# Patient Record
Sex: Female | Born: 1950 | Race: White | Hispanic: No | Marital: Single | Smoking: Never smoker
Health system: Southern US, Community
[De-identification: ages and names within clinical notes are randomized; demographics above are authoritative.]

## PROBLEM LIST (undated history)

## (undated) DIAGNOSIS — B019 Varicella without complication: Secondary | ICD-10-CM

## (undated) DIAGNOSIS — I219 Acute myocardial infarction, unspecified: Secondary | ICD-10-CM

## (undated) DIAGNOSIS — I10 Essential (primary) hypertension: Secondary | ICD-10-CM

## (undated) DIAGNOSIS — M1990 Unspecified osteoarthritis, unspecified site: Secondary | ICD-10-CM

## (undated) DIAGNOSIS — D509 Iron deficiency anemia, unspecified: Principal | ICD-10-CM

## (undated) DIAGNOSIS — I2510 Atherosclerotic heart disease of native coronary artery without angina pectoris: Secondary | ICD-10-CM

## (undated) DIAGNOSIS — I509 Heart failure, unspecified: Secondary | ICD-10-CM

## (undated) DIAGNOSIS — G4730 Sleep apnea, unspecified: Secondary | ICD-10-CM

## (undated) DIAGNOSIS — N189 Chronic kidney disease, unspecified: Secondary | ICD-10-CM

## (undated) DIAGNOSIS — I739 Peripheral vascular disease, unspecified: Secondary | ICD-10-CM

## (undated) DIAGNOSIS — R51 Headache: Secondary | ICD-10-CM

## (undated) DIAGNOSIS — K253 Acute gastric ulcer without hemorrhage or perforation: Secondary | ICD-10-CM

## (undated) DIAGNOSIS — E1151 Type 2 diabetes mellitus with diabetic peripheral angiopathy without gangrene: Secondary | ICD-10-CM

## (undated) DIAGNOSIS — E785 Hyperlipidemia, unspecified: Secondary | ICD-10-CM

## (undated) DIAGNOSIS — E063 Autoimmune thyroiditis: Secondary | ICD-10-CM

## (undated) DIAGNOSIS — R519 Headache, unspecified: Secondary | ICD-10-CM

## (undated) DIAGNOSIS — K912 Postsurgical malabsorption, not elsewhere classified: Secondary | ICD-10-CM

## (undated) DIAGNOSIS — D649 Anemia, unspecified: Secondary | ICD-10-CM

## (undated) DIAGNOSIS — I639 Cerebral infarction, unspecified: Secondary | ICD-10-CM

## (undated) DIAGNOSIS — E039 Hypothyroidism, unspecified: Secondary | ICD-10-CM

## (undated) DIAGNOSIS — K529 Noninfective gastroenteritis and colitis, unspecified: Secondary | ICD-10-CM

## (undated) HISTORY — DX: Cerebral infarction, unspecified: I63.9

## (undated) HISTORY — DX: Heart failure, unspecified: I50.9

## (undated) HISTORY — DX: Autoimmune thyroiditis: E06.3

## (undated) HISTORY — DX: Hyperlipidemia, unspecified: E78.5

## (undated) HISTORY — DX: Chronic kidney disease, unspecified: N18.9

## (undated) HISTORY — DX: Anemia, unspecified: D64.9

## (undated) HISTORY — DX: Iron deficiency anemia, unspecified: D50.9

## (undated) HISTORY — DX: Sleep apnea, unspecified: G47.30

## (undated) HISTORY — PX: ABDOMINAL HYSTERECTOMY: SHX81

## (undated) HISTORY — DX: Essential (primary) hypertension: I10

## (undated) HISTORY — DX: Noninfective gastroenteritis and colitis, unspecified: K52.9

## (undated) HISTORY — DX: Postsurgical malabsorption, not elsewhere classified: K91.2

## (undated) HISTORY — PX: CHOLECYSTECTOMY: SHX55

## (undated) HISTORY — PX: TONSILLECTOMY: SUR1361

## (undated) HISTORY — PX: CORONARY ARTERY BYPASS GRAFT: SHX141

## (undated) HISTORY — PX: JOINT REPLACEMENT: SHX530

## (undated) HISTORY — PX: ROUX-EN-Y GASTRIC BYPASS: SHX1104

## (undated) HISTORY — PX: TOTAL KNEE ARTHROPLASTY: SHX125

## (undated) HISTORY — DX: Atherosclerotic heart disease of native coronary artery without angina pectoris: I25.10

## (undated) HISTORY — DX: Unspecified osteoarthritis, unspecified site: M19.90

---

## 1999-10-08 ENCOUNTER — Encounter: Admission: RE | Admit: 1999-10-08 | Discharge: 2000-01-06 | Payer: Self-pay | Admitting: Surgery

## 2001-11-28 ENCOUNTER — Encounter: Payer: Self-pay | Admitting: Family Medicine

## 2001-11-28 ENCOUNTER — Encounter: Admission: RE | Admit: 2001-11-28 | Discharge: 2001-11-28 | Payer: Self-pay | Admitting: Family Medicine

## 2003-02-13 ENCOUNTER — Encounter: Payer: Self-pay | Admitting: Emergency Medicine

## 2003-02-13 ENCOUNTER — Inpatient Hospital Stay (HOSPITAL_COMMUNITY): Admission: AD | Admit: 2003-02-13 | Discharge: 2003-02-16 | Payer: Self-pay | Admitting: Emergency Medicine

## 2003-02-13 ENCOUNTER — Encounter: Payer: Self-pay | Admitting: Neurology

## 2003-02-14 ENCOUNTER — Encounter (INDEPENDENT_AMBULATORY_CARE_PROVIDER_SITE_OTHER): Payer: Self-pay | Admitting: Cardiology

## 2003-12-07 ENCOUNTER — Emergency Department (HOSPITAL_COMMUNITY): Admission: EM | Admit: 2003-12-07 | Discharge: 2003-12-07 | Payer: Self-pay | Admitting: Family Medicine

## 2004-02-07 ENCOUNTER — Emergency Department (HOSPITAL_COMMUNITY): Admission: EM | Admit: 2004-02-07 | Discharge: 2004-02-07 | Payer: Self-pay

## 2004-03-22 ENCOUNTER — Inpatient Hospital Stay (HOSPITAL_COMMUNITY): Admission: EM | Admit: 2004-03-22 | Discharge: 2004-03-31 | Payer: Self-pay | Admitting: Emergency Medicine

## 2004-03-22 ENCOUNTER — Ambulatory Visit: Payer: Self-pay | Admitting: Infectious Diseases

## 2004-04-21 ENCOUNTER — Encounter
Admission: RE | Admit: 2004-04-21 | Discharge: 2004-04-21 | Payer: Self-pay | Admitting: Thoracic Surgery (Cardiothoracic Vascular Surgery)

## 2004-05-07 ENCOUNTER — Emergency Department (HOSPITAL_COMMUNITY): Admission: EM | Admit: 2004-05-07 | Discharge: 2004-05-07 | Payer: Self-pay | Admitting: *Deleted

## 2005-10-29 ENCOUNTER — Encounter: Admission: RE | Admit: 2005-10-29 | Discharge: 2005-10-29 | Payer: Self-pay | Admitting: Family Medicine

## 2006-01-25 ENCOUNTER — Ambulatory Visit (HOSPITAL_COMMUNITY): Admission: RE | Admit: 2006-01-25 | Discharge: 2006-01-25 | Payer: Self-pay | Admitting: Orthopedic Surgery

## 2006-01-25 ENCOUNTER — Encounter: Admission: RE | Admit: 2006-01-25 | Discharge: 2006-02-12 | Payer: Self-pay | Admitting: General Surgery

## 2006-02-05 ENCOUNTER — Ambulatory Visit (HOSPITAL_COMMUNITY): Admission: RE | Admit: 2006-02-05 | Discharge: 2006-02-05 | Payer: Self-pay | Admitting: General Surgery

## 2006-02-15 ENCOUNTER — Ambulatory Visit (HOSPITAL_COMMUNITY): Admission: RE | Admit: 2006-02-15 | Discharge: 2006-02-16 | Payer: Self-pay | Admitting: Orthopedic Surgery

## 2006-02-25 ENCOUNTER — Ambulatory Visit: Payer: Self-pay | Admitting: Pulmonary Disease

## 2006-03-10 ENCOUNTER — Ambulatory Visit (HOSPITAL_COMMUNITY): Admission: RE | Admit: 2006-03-10 | Discharge: 2006-03-10 | Payer: Self-pay | Admitting: General Surgery

## 2006-04-14 ENCOUNTER — Ambulatory Visit: Payer: Self-pay | Admitting: Pulmonary Disease

## 2006-05-18 ENCOUNTER — Ambulatory Visit: Payer: Self-pay | Admitting: Pulmonary Disease

## 2006-07-02 ENCOUNTER — Emergency Department (HOSPITAL_COMMUNITY): Admission: EM | Admit: 2006-07-02 | Discharge: 2006-07-02 | Payer: Self-pay | Admitting: Family Medicine

## 2006-07-13 ENCOUNTER — Emergency Department (HOSPITAL_COMMUNITY): Admission: EM | Admit: 2006-07-13 | Discharge: 2006-07-13 | Payer: Self-pay | Admitting: Family Medicine

## 2007-06-19 ENCOUNTER — Emergency Department (HOSPITAL_COMMUNITY): Admission: EM | Admit: 2007-06-19 | Discharge: 2007-06-19 | Payer: Self-pay | Admitting: Family Medicine

## 2007-07-18 ENCOUNTER — Encounter: Admission: RE | Admit: 2007-07-18 | Discharge: 2007-10-16 | Payer: Self-pay | Admitting: General Surgery

## 2007-08-01 ENCOUNTER — Encounter (INDEPENDENT_AMBULATORY_CARE_PROVIDER_SITE_OTHER): Payer: Self-pay | Admitting: General Surgery

## 2007-08-01 ENCOUNTER — Ambulatory Visit: Payer: Self-pay | Admitting: Surgery

## 2007-08-01 ENCOUNTER — Inpatient Hospital Stay (HOSPITAL_COMMUNITY): Admission: RE | Admit: 2007-08-01 | Discharge: 2007-08-04 | Payer: Self-pay | Admitting: General Surgery

## 2007-08-02 ENCOUNTER — Encounter (INDEPENDENT_AMBULATORY_CARE_PROVIDER_SITE_OTHER): Payer: Self-pay | Admitting: General Surgery

## 2007-11-30 ENCOUNTER — Encounter: Admission: RE | Admit: 2007-11-30 | Discharge: 2007-11-30 | Payer: Self-pay | Admitting: General Surgery

## 2008-06-12 ENCOUNTER — Ambulatory Visit: Payer: Self-pay

## 2008-09-29 ENCOUNTER — Inpatient Hospital Stay: Payer: Self-pay | Admitting: *Deleted

## 2008-09-29 ENCOUNTER — Ambulatory Visit: Payer: Self-pay | Admitting: Cardiology

## 2008-11-10 DIAGNOSIS — R55 Syncope and collapse: Secondary | ICD-10-CM | POA: Insufficient documentation

## 2008-11-10 DIAGNOSIS — I10 Essential (primary) hypertension: Secondary | ICD-10-CM | POA: Insufficient documentation

## 2008-11-10 DIAGNOSIS — I25810 Atherosclerosis of coronary artery bypass graft(s) without angina pectoris: Secondary | ICD-10-CM | POA: Insufficient documentation

## 2010-04-02 ENCOUNTER — Emergency Department: Payer: Self-pay | Admitting: Emergency Medicine

## 2010-10-21 NOTE — Op Note (Signed)
[name], PARO                ACCOUNT NO.:  [id number]   MEDICAL RECORD NO.:  [id number]          PATIENT TYPE:  INP   LOCATION:  [place]                         FACILITY:  [facility]   PHYSICIAN:  Marland Kitchen T. Hoxworth, M.D.DATE OF BIRTH:  December 11, 1950   DATE OF PROCEDURE:  08/01/2007  DATE OF DISCHARGE:                               OPERATIVE REPORT   PREOPERATIVE DIAGNOSES:  1. Morbid obesity.  2. Cholelithiasis.   POSTOPERATIVE DIAGNOSES:  1. Morbid obesity.  2. Cholelithiasis.   SURGICAL PROCEDURES:  1. Laparoscopic Roux-en-Y gastric bypass.  2. Laparoscopic cholecystectomy with intraoperative cholangiogram.   SURGEON:  Dr. Excell Seltzer.   ASSISTANT:  Dr. Johnathan Hausen.   ANESTHESIA:  General.   BRIEF HISTORY:  [name] is a 60 year old female with multiple  comorbidities and morbid obesity unresponsive to medical management.  After an extensive discussion and workup detailed elsewhere, we elected  to proceed with laparoscopic Roux-en-Y gastric bypass for treatment of  her morbid obesity.  She was then brought to the operating room for this  procedure.   DESCRIPTION OF OPERATION:  The patient was brought to the operating room  and placed in supine position on the operating table and general  endotracheal anesthesia was induced.  She received preoperative IV  antibiotics.  The abdomen was widely sterilely prepped and draped.  The  correct patient and procedure were verified. She received preoperative  subcutaneous Lovenox.  PAS were in place.  Abdominal access was attained  obtained in the left subcostal space with an 11 mm OptiVu trocar and  pneumoperitoneum established.  Under direct vision, an 11 mm trocar was  placed in the right subcostal space in the right upper abdomen in the  left periumbilical space for a camera port, an additional 5 mm trocar  along the left flank.  The omentum had some adhesions in the low midline  from hysterectomy that her taken down with the  harmonic scalpel. The  omentum was then elevated, the base of the transverse mesocolon  identified and the ligament of Treitz identified.  A 40 cm afferent limb  was then traced at which point the mesentery appeared mobile and the  bowel was divided at this point with a single firing of the EndoGIA  white load stapler.  The mesentery was further mobilized for short a  distance with the harmonic scalpel.  The end of the Roux limb was marked  with a Penrose drain. A 100 cm Roux limb was then measured.  At this  point, an anastomosis was created between the end of the biliopancreatic  limb and the side of the Roux limb at 100 cm. This was performed with  enterotomies using the harmonic scalpel and a single firing of the  EndoGIA white load stapler. The staple line was intact without bleeding  and the common enterotomy was closed with running 2-0 Vicryl beginning  at either end of the enterotomy and tied centrally.  The mesenteric  defect was then closed with a running 2-0 silk suture. The suture and  staple lines were coated with Tisseel tissue sealant.  Following this,  the patient was placed in reverse Trendelenburg and a Nathanson  retractor was placed through a subxiphoid 5-mm site and the left of the  lobe of the liver elevated with excellent exposure of the stomach and  the hiatus.  There was a small to moderate-sized hiatal hernia that was  easily reducible. The angle of Hiss was mobilized dividing peritoneum  and careful blunt dissection was carried back toward the retrogastric  space.  Along the lesser curve, a 4-5 cm gastric pouch was measured and  the peritoneum incised along the lesser curve and dissection carried at  right angles to the lesser curve back toward the retrogastric space and  the free lesser sac entered. After being sure all tubes were removed  from the stomach with initial firing of the echelon gold stapler about 3  cm at right angles to the lesser curve was fired.   Following this, two  further firings of the blue load echelon stapler were carried up through  the angle of Hiss completely dividing the small gastric pouch. This was  done with the Ewald tube in place protecting the EG junction which  appeared widely patent. Following this, the omentum was divided to  reduce tension on the Roux limb which then was able to be brought up to  the gastric pouch without undue tension. An initial posterior row of  running 2-0 Vicryl suture was placed for the gastrojejunostomy.  Enterotomies were then made in the pouch and the Roux limb with harmonic  scalpel in preparation for the anastomosis.  As the gastrojejunostomy  lay oriented with the lesser curve in a little more superior than usual,  it appeared we had a much better angle of the stapler through the  patient's left side and we made the enterotomy near the end of the candy  cane and the left side of the patient's gastric pouch. This allowed a  GIA linear stapler blue load to be easily inserted and an approximately  2 cm and anastomosis created. The staple lines were intact without  bleeding.  The enterotomy was then closed from either end with running 2-  0 Vicryl.  The Ewald tube was then passed down through the anastomosis  and outer seromuscular row of 2-0 Vicryl was placed. Following this with  the outlet of the gastric pouch clamped, Dr. Hassell Done performed endoscopy  and with the pouch tensely distended with air there was no evidence of  leak.  The pouch was desufflated and all saline suctioned. Tisseel  tissue sealant was used to coat the suture and staple lines of the  anastomosis.  It should be noted that prior to the endoscopy, the  Peterson's defect was closed as well suturing the mesocolon to the edge  of the Roux limb mesentery with a 2-0 silk.  Following completion of the  gastric bypass, attention was turned to the cholecystectomy.  Two  additional 5 mm trocars were placed in the right upper  quadrant and the  previously placed subxiphoid trocar was redirected through the same  incision into the right upper quadrant to the right of the falciform  ligament.  This allowed good visualization and exposure.  The fundus was  grasped and elevated up over the liver.  A few omental adhesions were  taken down and the infundibulum retracted inferolaterally.  The  peritoneum anterior and posterior to Calot's triangle was incised and  the fibrofatty tissue was stripped down off the neck of the gallbladder  toward the porta hepatis.  Calot's triangle was thoroughly dissected and  the cystic duct gallbladder junction dissected 360 degrees and the  cystic duct dissected out over about a centimeter.  When the anatomy  appeared clear, an operative cholangiogram was taken through the cystic  duct which showed good filling of a normal common bile duct and  intrahepatic ducts with free flow into the duodenum and no filling  defects.  Following this, the cholangiocath was removed, the cystic duct  was triply clipped proximally and divided.  Calot's triangle was further  dissected and the cystic artery was doubly clipped proximally and  clipped distally and divided. The gallbladder was then dissected free  from its bed using hook cautery, placed in an EndoCatch bag and removed  through the left sided 11 mm trocar site without much difficulty.  Complete hemostasis was obtained in the gallbladder bed with cautery and  a Surgicel pack.  Following this, all CO2 was evacuated and trocars  removed.  Skin incisions were closed with staples. Sponge, needle and  instrument counts were correct. The patient was taken to the recovery in  good condition.      [name]. Hoxworth, M.D.  Electronically Signed     BTH/MEDQ  D:  08/01/2007  T:  08/01/2007  Job:  [id number]

## 2010-10-21 NOTE — Discharge Summary (Signed)
[name], [name]                ACCOUNT NO.:  [id number]   MEDICAL RECORD NO.:  [id number]          PATIENT TYPE:  INP   LOCATION:  [place]                         FACILITY:  [facility]   PHYSICIAN:  Marland Kitchen T. Hoxworth, M.D.DATE OF BIRTH:  February 13, 1951   DATE OF ADMISSION:  08/01/2007  DATE OF DISCHARGE:  08/04/2007                               DISCHARGE SUMMARY   DISCHARGE DIAGNOSIS:  Morbid obesity.   OPERATIONS AND PROCEDURES:  Laparoscopic Roux-en-Y gastric bypass, Dr.  Excell Seltzer on August 01, 2007.   HISTORY OF PRESENT ILLNESS:  [name] is a 60 year old female with  progressive morbid obesity unresponsive to medical management and  multiple comorbidities as described below.  Following a thorough  preoperative evaluation and discussion detailed elsewhere, she is  electively admitted for laparoscopic Roux-en-Y gastric bypass.   PAST MEDICAL HISTORY:  1. Surgery.  2. Hysterectomy.  3. Coronary bypass graft in 2005.  4. Medical insulin-dependent diabetes mellitus.  5. Hypertension.  6. History of CVA.  7. Sleep apnea.  8. Elevated cholesterol.  9. Hypothyroidism.   MEDICATIONS:  1. Crestor 10 daily.  2. Levothyroxine  300 mcg daily.  3. Metformin 1000 daily.  4. Novolin 70/30.  5. Hydrochlorothiazide 12.5 daily.  6. Metoprolol 100 daily.  7. Aspirin 81 daily.  8. Flexeril.  9. Omega.  10.Multivitamins.   ALLERGIES:  None.   Social history, family history and review of systems, see detailed H&P.   PHYSICAL EXAMINATION:  VITAL SIGNS:  She is 5 feet 7, weight 298 pounds,  BMI 46.78.  GENERAL:  Morbidly obese.  No acute distress.  ABDOMEN: Obese, well-healed incision, well-healed sternotomy, otherwise  negative.   HOSPITAL COURSE:  The patient was admitted on the morning her procedure.  She underwent an uneventful laparoscopic Roux-en-Y gastric bypass.  Her  postoperative course was unremarkable.  On her first postoperative day,  vital signs were stable.  CBGs  were in the 120s.  Gastrografin swallow  showed no leak or obstruction.  Venous Dopplers were negative.  She was  started on a liquid diet.   She was observed in the step-down unit for 24 hours and transferred to  the floor.  On the second postoperative day, she was advanced to protein  shakes.  A portion of her NPH insulin was resumed.  Abdomen remained  benign.  White count was 8.3, hemoglobin 13.9.  By the third  postoperative day, she was tolerating protein shakes well.  Abdomen was  soft, nontender.  Wounds healing well.  She is afebrile.  Vital signs  all within normal limits.  She is discharged home at this time.  Her  insulin dose will be followed closely as an outpatient by her primary  care.   Return our office in 1 week.      [name]. Hoxworth, M.D.  Electronically Signed     BTH/MEDQ  D:  09/07/2007  T:  09/07/2007  Job:  [id number]

## 2010-10-21 NOTE — H&P (Signed)
NAMEGELSOMINA, BAKKEN                ACCOUNT NO.:  [id number]   MEDICAL RECORD NO.:  [id number]          PATIENT TYPE:  INP   LOCATION:  [place]                         FACILITY:  [facility]   PHYSICIAN:  Marland Kitchen T. Hoxworth, M.D.DATE OF BIRTH:  1950-08-29   DATE OF ADMISSION:  08/01/2007  DATE OF DISCHARGE:  08/04/2007                              HISTORY & PHYSICAL   CHIEF COMPLAINT:  Morbid obesity.   HISTORY OF PRESENT ILLNESS:  Ms. Friar is a 60 year old female with a  history of progressive morbid obesity unresponsive to medical  management.  She has multiple comorbidities including insulin-dependent  diabetes mellitus and coronary artery disease.  Also sleep apnea and  arthritis.  After extensive preoperative workup and discussion, she is  electively admitted for laparoscopic Roux-en-Y gastric bypass.   PAST SURGICAL HISTORY:  1. Hysterectomy.  2. Coronary artery bypass graft.   PAST MEDICAL HISTORY:  1. Medically she is followed for diabetes mellitus.  2. Hypertension.  3. History of CVA in 2004.  4. Sleep apnea.  5. Elevated cholesterol.  6. Hypothyroidism.   MEDICATIONS:  1. Crestor 10 daily.  2. Levothyroxine 300 mcg daily.  3. Metformin 1000 daily.  4. Novolin 70/30 insulin.  5. Hydrochlorothiazide 12.5 daily.  6. Metoprolol 100 daily.  7. Aspirin 81 daily.  8. Flexeril.  9. Omega 3.  10.Vitamins.   ALLERGIES:  None.   SOCIAL HISTORY:  Single.  Lives alone.  Works as a Company secretary.  No cigarette or alcohol use.   FAMILY HISTORY:  Both parents deceased with coronary artery disease.   REVIEW OF SYSTEMS:  GENERAL:  Weight as above.  CARDIOVASCULAR:  No  recent chest pain or palpitations.  RESPIRATORY:  No shortness breath,  cough or wheezing.  Positive for sleep apnea.  GI:  No abdominal pain,  reflux, normal bowel movements.   PHYSICAL EXAMINATION:  VITAL SIGNS:  Afebrile, heart rate 100, blood  pressure 174/90, respirations 20, she is 5 feet 7,  298 pounds, BMI  46.78.  GENERAL:  Morbidly obese.  No acute distress.  SKIN:  Warm, dry.  No rash or infection.  HEENT: No masses or thyromegaly.  LYMPH NODES:  None palpable.  LUNGS:  Clear without wheezing or increased work of breathing.  CARDIAC:  Well-healed sternotomy.  Regular rate and rhythm.  ABDOMEN:  Obese.  Healed midline incision.  No hernias.  Soft,  nontender.   ASSESSMENT/PLAN:  Morbid obesity with multiple comorbidities including  insulin dependent diabetes mellitus, dyslipidemia, coronary artery  disease, hypertension, arthritis and sleep apnea.  She is electively  admitted for laparoscopic Roux-en-Y gastric bypass.      [name]. Hoxworth, M.D.  Electronically Signed     BTH/MEDQ  D:  09/07/2007  T:  09/07/2007  Job:  [id number]

## 2010-10-22 ENCOUNTER — Emergency Department: Payer: Self-pay | Admitting: Emergency Medicine

## 2010-10-24 NOTE — Assessment & Plan Note (Signed)
[facility]                               PULMONARY OFFICE NOTE   NAKEDA, MIDDLEMISS                       MRN:          [id number]  DATE:02/25/2006                            DOB:          03-01-1951    HISTORY OF PRESENT ILLNESS:  The patient is a 60 year old female who comes  in today for management of obstructive sleep apnea.  The patient underwent  nocturnal polysomnography in January 2006 where she was found to have severe  obstructive apnea with a respiratory disturbance index of 75 events per hour  and O2 desaturation as low as 53%.  She ultimately underwent CPAP titration  where 13 cmH2O pressure seemed to resolve her events.  The patient is  scheduled to have upcoming bariatric surgery, however, has been unable to  wear CPAP on a regular basis.  She feels that she gets claustrophobic with  the nasal mask and the full face mask.  She will typically awaken three  hours after going to sleep with anxiety and feeling that she cannot breathe  and will pull the mask off.  It is unclear from our conversations whether it  is the mask or the feeling of the pressure.  The patient will typically work  nights where she goes to bed at 7 a.m. and gets up at 5:30 p.m.  When she is  off, she goes to bed at 11 and gets up at 8.  She is very tired in the  mornings whenever she awakens and has inappropriate daytime sleepiness with  periods of inactivity.  She also will have sleepiness with driving on  occasions.  Of note, the patient's weight is about 10 pounds increased over  the last two years.   PAST MEDICAL HISTORY:  Significant for:  1. Hypertension.  2. History of coronary artery disease with CABG in 2005.  3. History of diabetes.  4. History of CVA.  5. History of hypothyroidism for which she is on medications.   CURRENT MEDICATIONS:  Aspirin 81 mg daily, Thyroxine 300 mcg daily,  Diovan/HCTZ 320/25 daily, Metformin 1000 b.i.d., Lotrel 10/20 one  daily,  metoprolol 100 mg daily, Crestor 10 mg daily, Jenuvia 100 mg daily, Humulin  N 55 units daily, Apidra 10 units daily.   ALLERGIES:  No known drug allergies.   SOCIAL HISTORY:  She works as a Quarry manager.  She is married and has never smoked.   FAMILY HISTORY:  Remarkable for her sister having asthma, mother and father  having heart disease.   REVIEW OF SYSTEMS:  As per history of present illness.  Also, see patient  intake form documented on the chart.   PHYSICAL EXAMINATION:  GENERAL:  She is a morbidly obese female in no acute distress.  VITAL SIGNS:  Blood pressure 158/104, pulse 66, temperature 98.2, weight 292  pounds, O2 saturation on room air is 96%.  HEENT:  Pupils equal, round, reactive to light and accommodation,  extraocular muscles are intact, nares patent without discharge.  Oropharynx  does show elongation of the soft palate with a normal uvula.  NECK:  Supple without lymphadenopathy or thyromegaly.  It is very difficult  to assess for JVD because of her size.  CHEST:  Totally clear.  CARDIAC:  Regular rate and rhythm, no murmurs, gallops, and rubs.  ABDOMEN:  Soft, nontender, good bowel sounds.  GENITAL EXAM, BREAST EXAM, AND RECTAL EXAM:  These were not done, not  indicated.  EXTREMITIES:  Lower extremities shows 1+ edema, left greater than right.  Pulses are intact distally.  NEUROLOGICAL:  She is alert and oriented with no obvious observable motor  defects.   IMPRESSION:  Severe, obstructive sleep apnea documented by nocturnal  polysomnography.  The patient is trying to wear CPAP but is having  difficulty either because of the mass or because of the pressure.  At this  point in time, I would like her to go back to the nasal pillows and will try  changing CPAP to BiPAP to see if that will help with her tolerance.  I would  also like to decrease her pressure to a more tolerable range and not  increase it until she is able to be more compliant.  I would rather  treat  70% of her sleep apnea with lower pressures than 0 on higher pressures.   PLAN:  1. We will switch to BiPAP with inspiratory pressure at 12 and expiratory      pressure of 8 and nasal pillows.  2. The patient will follow up in four weeks or sooner if she has problems.                                   [name], MD,FCCP   [facility]/MedQ  DD:  02/25/2006  DT:  02/27/2006  Job #:  [id number]   cc:   [name]. Hoxworth, M.D.  Chipper Herb, M.D.

## 2010-10-24 NOTE — Consult Note (Signed)
[name], [name]                ACCOUNT NO.:  [id number]   MEDICAL RECORD NO.:  [id number]          PATIENT TYPE:  INP   LOCATION:  3306                         FACILITY:  [facility]   PHYSICIAN:  Valentina Gu. Roxy Manns, M.D. DATE OF BIRTH:  September 04, 1950   DATE OF CONSULTATION:  03/25/2004  DATE OF DISCHARGE:                                   CONSULTATION   REQUESTING PHYSICIAN:  Shelva Majestic, M.D.   PRIMARY CARE PHYSICIAN:  Santiago Glad L. Darron Doom, M.D.   REASON FOR CONSULTATION:  Severe 3-vessel coronary artery disease with new  onset angina.   HISTORY OF PRESENT ILLNESS:  [name] is a 60 year old morbidly obese white  female from [place] with no previous history of coronary artery disease  but risk factors notable for history of hypertension, cardiovascular  disease, type 2 diabetes mellitus, hyperlipidemia, and a strong family  history of coronary artery disease.   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  This pain was relieved  by rest within 5 minutes. On October 15, again while the patient was at work  performing some fairly strenuous activity, she developed prolonged episode  of similar substernal chest pain, prompting her to present to the emergency  room.  Her pain resolved within 5 minutes.   The patient was admitted to the hospital and ruled out for acute myocardial  infarction, although she did have borderline elevated troponins consistent  with unstable angina. Subsequently, she underwent elective cardiac  catheterization yesterday by Dr. Ellouise Newer. Findings at the time of  catheterization were notable for severe 3-vessel coronary artery disease  with preserved left ventricular function.  Of note, the patient was  hypertensive at the time of her original presentation as well, with  inadequate control of her  underlying blood pressure.  Cardiac surgical  consultation has been requested.   REVIEW OF SYSTEMS:  GENERAL:  The patient reports otherwise feeling well.  She has a good appetite.  Her weight has been fairly stable, although she  weighs between 290-310 pounds on the average.  CARDIAC:  The patient denied  any episodes of chest pain, other than that described previously.  The  patient reports some problems with exertional shortness of breath, resting  shortness of breath, PND, orthopnea or lower extremities edema.  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 She  denies any syncopal episodes.  RESPIRATORY:  Notable for absence of  productive cough, hemoptysis, wheezing.  The patient does develop decreased  respirations as well as some transient dip in oxygen saturation while she is  sleeping in the supine position, as noted while she has been in-hospital  here this admission. GASTROINTESTINAL:  Negative.  The patient has no  difficulty swallowing. She denies any bowel problems and specifically  reports no hematochezia, hematemesis, melena, constipation.  MUSCULOSKELETAL:  Notable for some mild, chronic problems with left hip  pain. She otherwise denies arthritis or arthralgias.  NEUROLOGIC:  Notable  for recent episode of Bell's palsy on the right side approximately 6 weeks  ago.  The symptoms have improved and the patient has completed a brief  steroid taper. She has also seen an acupuncture specialist.  Patient had  history of stroke 1 year ago with essentially no residual symptoms.  The  patient denies any recent history of transient blindness, numbness or  weakness either involving upper or lower extremities.  GENITOURINARY:  Negative.  HEENT:  Negative. INFECTIOUS:  Negative.  ENDOCRINE : Notable for  type 2 diabetes mellitus.  The patient checks her sugars regularly and  states they have been under good control until recently when she had the   steroid taper administered, and this made her blood sugars go quite high  temporarily.  PSYCHIATRIC:  Notable in that the patient has been under somewhat increased  stress recently for personal reasons. Denies problems with clinically  significant anxiety or depression.   PAST MEDICAL HISTORY:  1.  Hypertension.  2.  Stroke approximately 1 year ago.  3.  Type 2 diabetes mellitus.  4.  Morbid obesity.  5.  Hyperlipidemia.  6.  Hypothyroidism.  7.  Bell's palsy approximately 6 weeks ago.  8.  Possible obstructive sleep apnea.   PAST SURGICAL HISTORY:  1.  Hysterectomy for uterine fibroids 1995.  2.  Tonsillectomy, remote past.   FAMILY HISTORY:  Presence of strong family history of coronary artery  disease.  The patient's father and grandfather both suffered myocardial  infarctions at young ages and ultimately died of coronary disease.   SOCIAL HISTORY:  Patient is single and lives alone here in [place]. Her  closest family members are her step-mother and step-sister and they both  live in [place].  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  She is a nonsmoker and denies significant alcohol  consumption.   MEDICATIONS PRIOR TO ADMISSION:  1.  Nifedipine ER.  2.  Lisinopril.  3.  Hydrochlorothiazide.  4.  Levoxyl.  5.  Avandamet.  6.  Humalog 15 units subcutaneous twice daily at breakfast and bedtime.   DRUG ALLERGIES:  NONE KNOWN.   PHYSICAL EXAMINATION:  VITAL SIGNS:  Currently afebrile, in normal sinus  rhythm and normotensive.  Blood pressures over the last 24 hours have ranged  between 110-140 mmHg respectively.  She did have a low grade temperature  yesterday morning.  GENERAL:  The patient is a well-appearing morbidly obese female who appears  her stated age, in no acute distress.  HEENT:  Grossly unrevealing.  NECK:  Supple. No cervical or supraclavicular lymphadenopathy.  There is no jugulovenous distention, no carotid bruits are  noted.  LUNGS:  Auscultation of the chest revealed clear and symmetrical breath  sounds bilaterally. No wheezes or rhonchi are noted.  Patient has very large  breasts, but there is no sign of active skin infections in the skin folds  underneath her breasts.  CARDIOVASCULAR:  Regular rate and rhythm.  ABDOMEN:  Very obese, but soft and nontender. There are no palpable masses.  Bowel sounds present.  EXTREMITIES:  Warm and well-perfused.  There is no lower extremities edema.  Distal pulses are diminished but palpable in both lower legs in the dorsalis  pedis position. There is no sign of significant venous insufficiency.  RECTAL/GENITOURINARY:  Both deferred.  SKIN:  Clean and dry, healthy-appearing throughout.   DIAGNOSTIC TESTS:  Cardiac catheterization performed by Dr. Claiborne Billings reviewed.  This demonstrated severe 3-vessel coronary artery disease with preserved  left ventricular function. Specifically, there is 80-90% proximal stenosis  of the left anterior descending coronary artery. There is 90% proximal  stenosis of the first diagonal branch.  There is 70% stenosis of the distal  left anterior descending coronary artery beyond take-off of the first  diagonal branch. The distal left anterior descending coronary artery appears  to be small and perhaps intramyocardial and may be a relatively poor target  for grafting.  As such, the diagonal branch may actually be the most  dominant vessel on the anterior wall.  There is 50-60% proximal stenosis of  the left circumflex coronary artery.  There is 100% occlusion of the mid  right coronary artery with left to right collateral filling of the distal  right coronary artery system.  Left ventricular function appears reasonably  well preserved. There are signs and suggestions of left ventricular  hypertrophy.  There are no significant wall motion abnormalities.   IMPRESSION:  Severe 3-vessel coronary artery disease with new onset angina.  Based  upon coronary anatomy, there is no question that likely the best long-  term treatment strategy for [name] would include coronary artery bypass  grafting. However, the associated risks of surgery will be elevated due to  her extreme morbid obesity and other comorbid conditions as noted  previously.  She will be at particular risk for perioperative stroke,  respiratory failure, pneumonia, renal failure, difficulty with wound  healing, mediastinitis. She may have a somewhat prolonged convalescence.   Nonetheless, percutaneous coronary intervention would be considerably less  satisfactory with respect to long-term results from the standpoint of  treatment of her underlying coronary artery disease.   PLAN:  I have discussed the issues at length with [name] in her hospital  room this afternoon.  Alternative treatment strategies have been discussed.  She understands and accepts the very high risk nature of surgical intervention.  She also understands the relative palliative nature of  coronary revascularization with implications for the need for life-long  aggressive management of the patient's serious co-morbid conditions and risk  factors including hypertension, hyperlipidemia, diabetes and morbid obesity.  All of her questions have been addressed. She understands and accepts the  associated risks of surgery, including but not limited to risk of death,  myocardial infarction, congestive heart failure, respiratory failure,  pneumonia, bleeding requiring blood transfusion, arrhythmia,  infection, recurrent coronary artery disease, congestive heart failure,  recurrent blockages in bypass grafts or native coronary arteries, difficulty  with wound healing.  All of her questions have been addressed.   We tentatively plan to proceed with surgery tomorrow.       CHO/MEDQ  D:  03/25/2004  T:  03/25/2004  Job:  [id number]   cc:   Shelva Majestic, M.D.  [phone] [address], [address] [place], [place]  [address]  Fax: [place] Darron Doom, M.D.  [address] [place]  [place] [address]  Fax: [phone]

## 2010-10-24 NOTE — Op Note (Signed)
[name], [name]                ACCOUNT NO.:  [id number]   MEDICAL RECORD NO.:  [id number]          PATIENT TYPE:  INP   LOCATION:  2307                         FACILITY:  [facility]   PHYSICIAN:  Valentina Gu. Roxy Manns, M.D. DATE OF BIRTH:  03-02-1951   DATE OF PROCEDURE:  03/26/2004  DATE OF DISCHARGE:                                 OPERATIVE REPORT   PREOPERATIVE DIAGNOSIS:  Severe three vessel coronary artery disease with  new onset angina.   POSTOPERATIVE DIAGNOSIS:  Severe three vessel coronary artery disease with  new onset angina.   PROCEDURES:  1.  Median sternotomy for coronary artery bypass grafting times three (left      internal mammary artery to the first diagonal branch of the left      anterior descending coronary artery, saphenous vein graft to the      circumflex marginal branch, saphenous vein graft to the posterior      descending artery and endoscopic saphenous harvest from right thigh).   SURGEON:  Valentina Gu. Roxy Manns, M.D.   ASSISTANT:  Ivin Poot, M.D.   SECOND ASSISTANT:  Jobe Igo. Zigmund Gottron.   ANESTHESIA:  General.   BRIEF CLINICAL NOTE:  The patient is a 60 year old morbidly obese white  female from [place] with no previous history of coronary artery disease,  but risk factors notable for a history of hypertension, type 2 diabetes  mellitus, hyperlipidemia and a strong family history of coronary artery  disease.  The patient presents with new onset symptoms of angina and was  admitted to the hospital on October 15.  She ruled out for an acute  myocardial infarction.  She underwent cardiac catheterization by Dr. Ellouise Newer demonstrating severe three vessel coronary artery disease with  preserved left ventricular function.  A full consultation note has been  dictated previously.   OPERATIVE CONSENT:  The patient had been counseled at length regarding the  indications, risks and potential benefits of coronary artery bypass  grafting.  Alternative  treatment strategies have been discussed. She  understands and accepts all associated risks of surgery and desires to  proceed as described.   OPERATIVE FINDINGS:  1.  Morbid obesity.  2.  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  3.  Bifid left anterior descending coronary artery with the distal portion      of the left anterior descending coronary artery very small and      intramyocardial.  The diagonal branch off the left anterior descending      coronary artery is the lateral branch of this bifid system and appears      to represent the dominant artery supplying the anterior wall of the left      ventricle.  4.  The circumflex marginal branch and the posterior descending coronary      artery were reasonable targets for grafting.  The left internal  mammary      artery and the saphenous vein conduit were all good quality conduit.      The diagonal branch was a fair quality target for grafting.   DESCRIPTION OF PROCEDURE:  The patient was brought to the operating room on  the above mentioned date and central monitoring is established by the  anesthesia service under the care and direction of Dr. Rica Koyanagi.  Specifically, a Swan-Ganz catheter was placed through the internal jugular  approach. A radial arterial line is placed in the left wrist.  Intravenous  antibiotics are administered. Following induction with general endotracheal  anesthesia, a Foley catheter was placed. The patient's chest, abdomen, both  groins and both lower extremities are prepared and draped in a sterile  manner.  Of note, the patient's left upper extremity had to be positioned  with the left arm extending out a 90 degree angle throughout the procedure.  Because of the patient's morbid obesity, the arm could not be  tucked at the  patient's side without completely obliterating the pulse to the radial  artery catheter.   Endoscopic saphenous vein harvesting was performed initially through a small  incision made just below the right knee.  The greater saphenous vein from  the entire right thigh was removed.  The incision below the knee is extended  several cm to facilitate removal of additional segment of greater saphenous  vein from the right lower leg. After the saphenous vein is removed from the  right lower extremity, the surgical incisions are closed in multiple layers  with running absorbable suture.  The saphenous vein conduit which is  utilized for surgery is felt to be good quality conduit.   The median sternotomy incision is performed and the left internal mammary  artery is dissected from the chest wall and prepared for bypass grafting.  The left internal mammary artery is slightly small caliber, but otherwise  good quality and has excellent flow.  The patient is heparinized  systemically.  After initial administration of heparin, the patient's  activated clotting time increases some, but does not increase anywhere near  the value required to sustain cardiopulmonary bypass.  Additional doses of  heparin are administered and the patient's activated clotting time actually  decreases between samples.  The patient is then given one complete vial of  recombinant antithrombin 3 for presumed factor deficiency.  After waiting  ten minutes, the activated clotting time again decreased rather than  increasing.  Subsequently, a second vial of recombinant antithrombin 3 is  given and simultaneously two units of fresh frozen plasma are thawed to  ultimately be transfused.  The patient's activated clotting time does  increase to greater than 600 seconds after the second vial of antithrombin 3  is given.  Eventually, two units of fresh frozen plasma are also administered and the patient's activated  clotting time remains  satisfactorily elevated throughout the entire cardiopulmonary bypass run.   The ascending aorta is normal in appearance.  The ascending aorta and the  right atrium are cannulated for cardiopulmonary bypass.  Once satisfactory  heparinization and anticoagulation is achieved, cardiopulmonary bypass is  begun and the surface of the heart is inspected.  There is moderate left  ventricular hypertrophy and biventricular enlargement.  Distal sites are  selected for coronary artery bypass grafting.  Of note, the coronary anatomy  for the anterior wall of the left ventricle is as described previously.  Specifically, the left anterior descending coronary artery appeared to be  a  bifid system. It bifurcates into a diagonal branch and a smaller  continuation branch of the left anterior descending coronary artery which is  small and deep intramyocardial.  The diagonal branch is felt to be by far  the larger of the two branches and probably the most important branch  supplying the anterior wall of the left ventricle.  Portions of saphenous  vein and the left internal mammary artery are trimmed to appropriate length.  A temperature probe is placed in the left ventricular septum.  A  cardioplegia catheter is placed in the ascending aorta.   The patient is allowed to cool passively to 34 degrees systemic temperature.  The aortic cross clamp is applied and cardioplegia is delivered in an  antegrade fashion through the aortic root.  Iced saline slush is applied for  topical hypothermia.  The initial cardioplegic arrest and myocardial cooling  are felt to be excellent.  Repeat doses of cardioplegia are administered  intermittently throughout the cross clamp portion of the operation both  through the aortic root and down the subsequently placed vein grafts to  maintain septal temperature below 15 degrees Centigrade.  The following  distal coronary anastomoses are performed:  1.  The  posterior descending coronary artery is grafted with a saphenous      vein graft in an end to side fashion.  This coronary artery measures 1.7      cm in diameter at the site of the distal bypass and is of fair to good      quality target.  There is diffuse disease in the posterior descending      coronary artery further downstream, but a 1.0 probe will pass through      the diseased segment.  A 1.5 probe easily passes into the distal right      coronary artery.  2.  The circumflex marginal branch is grafted with a saphenous vein graft in      an end to side fashion.  This coronary artery measures 2.0 mm in      diameter and is of good quality at the site of distal bypass.  3.  The diagonal branch off the left anterior descending coronary artery is      grafted with the left internal mammary artery in an end to side fashion.      This coronary artery measures 1.3 mm in diameter at the site of distal     bypass and is of fair quality. A 1.5 probe will pass retrograde up the      diagonal branch.  A 1.0 probe will pass distally although there is      diffuse disease in the distal segment.   Both proximal saphenous vein anastomoses are performed directly to the  ascending aorta prior to removal of the aortic cross clamp. The left  ventricular septal temperature is noted to increase appropriately following  reperfusion of the left internal mammary artery.  The aortic cross clamp is  removed after a total cross clamp time of 54 minutes.   The heart is defibrillated and subsequently normal sinus rhythm resumes.  All proximal and distal coronary anastomoses are inspected for hemostasis  and appropriate graft orientation.  Epicardial pacing wires are fixed to the  right ventricular outflow tract into the right atrial appendage. The patient  is rewarmed to 37 degrees Centigrade temperature.  The patient is weaned  from cardiopulmonary bypass without difficulty.  The patient's rhythm at   separation from  bypass is sinus bradycardia.  AV sequential pacing is  employed.  No inotropic support is required.  Total cardiopulmonary bypass  time for the operation is 66 minutes.  The patient was transfused one unit  of packed red blood cells during cardiopulmonary bypass due to anemia.   The venous and arterial cannulae are removed uneventfully. Protamine is  administered to reverse the anticoagulation.  The mediastinum and the left  chest are irrigated with saline solution containing vancomycin.  Meticulous  surgical hemostasis is ascertained. The mediastinum and the left chest are  drained with three chest tubes placed through separate stab incisions  inferiorly.  The median sternotomy is closed with double strength sternal  wire.  The soft tissues anterior to the sternum are closed in multiple  layers.  The subcutaneous tissues and dermis are closed with simple  interrupted sutures.  The skin is closed with a running subcuticular skin  closure.   The patient tolerated the procedure well and is transported to the surgical  intensive care unit in stable condition.  There are no intraoperative  complications.  All sponge, instrument and needle counts are verified  correct at the completion of the operation.       CHO/MEDQ  D:  03/26/2004  T:  03/26/2004  Job:  [id number]   cc:   Shelva Majestic, M.D.  [phone] [address] [place], [place] [address]  Fax: [place] Darron Doom, M.D.  [address] [place]  [place] [address]  Fax: [phone]

## 2010-10-24 NOTE — H&P (Signed)
NAME:  [name], [name]                          ACCOUNT NO.:  [id number]   MEDICAL RECORD NO.:  [id number]                   PATIENT TYPE:  EMS   LOCATION:  MAJO                                 FACILITY:  [facility]   PHYSICIAN:  Michael L. Doy Mince, M.D.           DATE OF BIRTH:  02-15-1951   DATE OF ADMISSION:  02/13/2003  DATE OF DISCHARGE:                                HISTORY & PHYSICAL   CHIEF COMPLAINT:  Right upper extremity weakness and speech problems.   HISTORY OF PRESENT ILLNESS:  This is the initial stroke service admission  for this 60 year old woman with a past medical history which includes  hypertension, diabetes, and morbid obesity. She was at work here at [employer] at about 8 p.m. yesterday when she noticed clumsiness in the right  upper extremity. She noticed particularly that she was having difficulty  with dropping the patient's blood samples which she had taken. She also  noted that she was having a little bit of difficulty with speech. She could  think of what she wanted to say, but was having difficulty getting words  out. She was not having particularly slurred speech. When she left she went  to swipe her card through the time clock and noticed that she could not hold  onto it very well. Nevertheless she simply drove home and did not notify  anyone else of her symptoms until about 5:30 the next morning when she asked  a friend to take her to the doctor and the friend brought her to the [facility] Room for further evaluation. Her symptoms have improved, but  are still present. She denies any history of previous similar symptoms. She  has had no associated headache, dizziness, chest pain, shortness of breath,  palpitations, nausea, or vomiting.   PAST MEDICAL HISTORY:  Remarkable for diabetes, which she admits is under  poor control, also hypertension, and morbid obesity. She is unsure of her  cholesterol status. She had a hysterectomy in 1995. She  gets her medical  care through [facility].   FAMILY HISTORY:  Remarkable for multiple members who have had strokes in old  age, which often lead to their deaths.   SOCIAL HISTORY:  She denies any tobacco use. 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   ALLERGIES:  Denies.   MEDICATIONS:  1. Actos 30 mg daily.  2. Prinzide 20/25 mg daily.  3. Procardia XL 90 mg daily.  4. Levothyroxine 0.2 mg daily.  5. Glucotrol XL 10 mg b.i.d.  6. Baby aspirin.   REVIEW OF SYSTEMS:  She has had recent upper respiratory symptoms over the  last few days, for which she has been taking over-the-counter Tavist.  Otherwise review of systems is completely negative except for H&P and  admission ER records.   PHYSICAL EXAMINATION:  VITAL SIGNS:  Temperature 99.7, blood pressure  150/69, pulse 87, respirations 18, O2 saturation 96% on  2 L by nasal  cannula.  GENERAL:  She is alert, morbidly obese, in no evident distress.  HEENT:  Normocephalic and atraumatic. Oropharynx is benign.  NECK:  Supple without carotid bruit.  HEART:  Regular rate and rhythm without murmurs.  CHEST:  Clear to auscultation.  ABDOMEN:  Obese and soft with normal active bowel sounds.  EXTREMITIES:  2+ pulses.  NEUROLOGIC:  Mental status:  She is awake, alert, and fully oriented. Speech  is fluent without dysarthria. She has no difficulty on confrontational  naming. Cranial nerves funduscopic exam reveals proliferative changes.  Pupils equal and reactive. Extraocular movements full without nystagmus.  Visual fields full to confrontation. Face, tongue, and palette move normally  and symmetrically. Facial sensation is intact to pinprick. Shoulder shrug  strength is normal. Motor testing normal bulk and tone. There is mild  weakness of the right upper extremity particularly in the triceps and finger  abductors. Otherwise normal strength throughout. Sensation:  Pinprick is   symmetric throughout, but she does consistently extinguish on the right to  double cell tainting stimulation in the upper extremity and to a lesser  extent the lower. Reflexes are 2+ of extremities and knees, trace ankles.  Toes are downgoing. Coordination and rapid movements were best performed on  the right. Finger to nose was performed adequately. Gait she is able to rise  from the bed easily and ambulates without much difficulty. She is able to  stand with her feet together and can toe walk a bit.   LABORATORY DATA:  CBC is unremarkable. CMET is remarkable for a low  potassium of 3.2 with an elevated glucose of 301. Coags are normal. EKG  reveals first degree AV block, poor R-wave progression, but no acute  findings. CT of the head is personally reviewed and reveals a small subacute  infarct in the left frontal parietal white matter just below the cortex.   IMPRESSION:  1. Acute left brain stroke.  2. Hypertension.  3. Diabetes.   PLAN:  We will admit to the stroke unit for routine stroke care including  MRI, MRA, carotid, and transcranial Dopplers, transthoracic echo, and stroke  labs. We will ask PT and OT to assist in rehabilitation. She will likely  need more potent antiplatelet therapy. She will also need risk factor  intervention, consider teaching service evaluation for management of her  various medical problems.                                                Michael L. Doy Mince, M.D.    MLR/MEDQ  D:  02/13/2003  T:  02/13/2003  Job:  [id number]

## 2010-10-24 NOTE — Discharge Summary (Signed)
NAME:  [name], [name]                          ACCOUNT NO.:  [id number]   MEDICAL RECORD NO.:  [id number]                   PATIENT TYPE:  INP   LOCATION:  3004                                 FACILITY:  [facility]   PHYSICIAN:  Alyson Locket. Love, M.D.                 DATE OF BIRTH:  Jul 27, 1950   DATE OF ADMISSION:  02/13/2003  DATE OF DISCHARGE:  02/16/2003                                 DISCHARGE SUMMARY   DISCHARGE DIAGNOSES:  1. 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  2. Hypertension.  3. Poorly controlled diabetes.  4. Obesity.  5. Hysterectomy in 1995.  6. Hypothyroidism.   DISCHARGE MEDICATIONS:  1. Actos 30 mg q.d.  2. Levoxyl 200 mcg q.d.  3. Glucotrol 10 mg b.i.d.  4. Aggrenox b.i.d.  5. Zocor 40 mg q.d.   PROCEDURES:  1. CT of the brain showed no acute abnormalities.  2. MRI of the brain showed a left insular white mater infarct x 2.  3. MRA of the head shows decreased branch left MCA cutoff with large and     medium vessels patent.  4. Carotid doppler was normal.  5. A 2-D echocardiogram was essentially normal; however, study was poor.  6. Transcranial doppler, results pending at the time of discharge.  7. Transesophageal echocardiogram shows moderate to marked LVH with normal     ejection fraction, moderate thoracic changes in the aorta with no complex     plaque, no PFO, no ASD, no embolic source.  8. EKG shows first degree AV block with right axis deviation.   LABORATORY DATA:  CBC normal.  Differential normal.  Coagulation studies  normal.  Chemistry with potassium 3.2, glucose 301.  Liver functions show  AST 19, ALT 31, ALP 139, and total bilirubin 1.0.  Hemoglobin A1C 10.3,  homocystine 10.55 (normal).  Lipid profile with cholesterol 315,  triglycerides 386, HDL 34, and LDL 204.   HISTORY OF PRESENT ILLNESS:  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  About 8 p.m., the day before admission, she notes clumsiness in her right  upper extremity.  She noticed she was having difficulty when she dropped the  patient's blood sample she had taken.  She also noted some difficulty with  speech.  She could think of what she wanted to say but had a difficult time  getting words out.  Nevertheless, the patient got in her car and drove  herself home and did not tell anyone about her symptoms until 5:30 the next  morning when she asked a friend to take her to the doctor.  The friend  brought her to the emergency room for further evaluation.  Her symptoms have  improved since last night; however, residual remains.  She was admitted to  the hospital for stroke evaluation.  She is not a TPA or __________  candidate secondary to time.   HOSPITAL COURSE:  MRI did reveal acute infarct in the left brain.  Source is  felt to be embolic; however, TEE and 2-D echocardiogram as well as carotid  dopplers were all negative.   Other risk factors were evaluated.  The patient was found to have hemoglobin  A1C greater than 10, obviously poorly controlled prior to admission.  The  patient also with elevated cholesterol at 315 with LDL of 204 for which she  was started on Zocor 40 mg.  The patient also with elevated homocystine for  which she was started on Foltx and vitamin B complex which will reduce  homocystine, a risk factor for heart disease and stroke.   The patient was treated with IV heparin during hospitalization but with no  embolic source found.  Will be started on Aggrenox b.i.d. at discharge for  secondary prevention as well as other risk factor control.   The patient did well with PT, OT, and speech therapy and was felt to be  strong enough to return home alone.  We will recommend the patient not work  for one week and then return  part-time for one week before resumption of  full-time activities.  No outpatient therapies will be needed.   CONDITION ON DISCHARGE:  The patient is alert and oriented x 3.  Speech is  mildly dysarthric.  She can name objects.  Difficulty repeating.  She does  follow commands and can read.  There is still some hesitation with her  speech.  Her visual fields are full.  She moves all her extremities well.  She has no weakness.  She remained slightly clumsy with her right hand.   PLAN:  1. Discharge home.  2. Aggrenox for secondary prevention.  3. Need hypertension, diabetes, and other risk factor controls including     weight.  4. Foltx for elevated homocystine.  5. New Zocor.  Will need follow-up.  6. Do not return to work for one week and then may return at part-time for     one week before resuming full-time activities.      Burnetta Sabin, N.P.                         Alyson Locket. Erling Cruz, M.D.    SB/MEDQ  D:  02/16/2003  T:  02/18/2003  Job:  [id number]   cc:   Pramod P. Leonie Man, MD  Fax: [facility]

## 2010-10-24 NOTE — Op Note (Signed)
[name]                ACCOUNT NO.:  [id number]   MEDICAL RECORD NO.:  [id number]          PATIENT TYPE:  OIB   LOCATION:  5041                         FACILITY:  [facility]   PHYSICIAN:  Metta Clines. Supple, M.D.  DATE OF BIRTH:  March 22, 1951   DATE OF PROCEDURE:  02/15/2006  DATE OF DISCHARGE:                                 OPERATIVE REPORT   PREOP DIAGNOSIS:  Left knee pain, swelling and mechanical symptoms with  probable degenerative meniscal tear, as well as early arthrosis.   POSTOP DIAGNOSIS:  1. Left knee osteoarthrosis.  2. Multiple chondral loose bodies.  3. Tears of the medial and lateral menisci.  4. Severe diffuse synovitis.   PROCEDURE:  1. Left knee diagnostic arthroscopy.  2. Extensive synovectomy.  3. Removal of multiple chondral loose bodies.  4. Chondroplasty of the patellofemoral joint.  5. Partial medial and lateral meniscectomies.  6. Chondroplasty of the medial compartment and the lateral tibial plateau.   SURGEON OF RECORD:  Metta Clines. Supple, M.D.   ASSISTANTOlivia Mackie Shuford PA-C.   ANESTHESIA:  An LMA general as well as a knee block.   ESTIMATED BLOOD LOSS:  Minimal.   DRAINS:  None.   TOURNIQUET TIME:  None was used.   HISTORY:  [name] is a 60 year old female with history of morbid obesity  who has had progressively increasing left knee pain and mechanical symptoms  which have been refractory to all attempts at conservative management.  Preoperative MRI scan does confirm complex medial meniscus tear.  Plain  radiographs are suggestive of early degenerative changes.  Due to her  ongoing pain and functional limitation, she is brought to the operating  room, at this time, for planned left knee arthroscopy with debridement as  described below.   Preoperatively counseled [name] on treatment options as well as risks  versus benefits thereof.  Possible surgical complications including  infection, neurovascular injury DVT, PE, persistent  pain and possible need  for additional surgery were reviewed.  She understands and accepts and  agrees with our planned procedure.   PROCEDURE IN DETAIL:  After undergoing routine preop evaluation, the patient  received prophylactic antibiotics.  A knee block was established in the  holding area with the anesthesia department.  The patient was placed supine  on the operating table and underwent induction under LMA general anesthesia.  The left leg was placed in a leg holder and was sterilely prepped and draped  in centered fashion.  Standard portals were established and diagnostic  arthroscopies performed.  The suprapatellar pouch and gutter showed severe  diffuse synovitis with severe proliferative overgrowth as well as a very  large thickened medial plica.  An extensive synovectomy was performed  including resection of the medial plica.  In addition, we found multiple  chondral loose bodies in the posterior compartments, and in the gutters  which were evacuated and removed with a shaver.  The patellofemoral joint  showed advanced grade 3 chondromalacia and grade 1 on the patella; and a  chondroplasty was performed.  These areas were debrided with a shaver  to a  stable cartilaginous base.  The intercondylar notch showed the ACL to be  grossly intact.  Medially there was extensive diffuse grade 3 chondromalacia  on both the femoral condyle and on the medial tibial plateau.  There is also  a radial tear at the root of the medial meniscus at the posterior horn which  was markedly unstable with severe degeneration of the middle and posterior  thirds.   The medial meniscus was trimmed back to a stable margin with the basket; and  the shaver was brought in for final contouring and then removal of the  meniscal fragments.  Probing of the remaining portion of the medial meniscus  was noted to be stable.  Multiple chondral loose bodies were evacuated from  the posterior medial compartment.   Laterally there was advanced  chondromalacia of the lateral tibial plateau with an area of full-thickness  cartilage loss in the very central aspect of the tibial plateau.  This  surrounding rim of loose cartilage was debrided with a shaver.  There was  also a degenerative tear involving the mid third of the lateral meniscus;  and this was also trimmed back to a stable margin with a shaver.  At this  point, final inspection and irrigation was then completed.  Fluid and  instruments were removed.  Multiple loose bodies were evacuated from the  posterolateral compartment as well.  Final inspection and irrigation was  then completed.  Fluid and instruments were removed.   A combination of Marcaine, morphine, epinephrine, and clonidine was  instilled into joint.  Port was closed with Steri-Strips.  A bulky dry  dressing wrapped about the left knee and the left leg was wrapped with an  Ace bandage.  The patient was then extubated, and transferred to the  recovery room in stable condition.      Metta Clines. Supple, M.D.  Electronically Signed     KMS/MEDQ  D:  02/15/2006  T:  02/16/2006  Job:  [id number]

## 2010-10-24 NOTE — Discharge Summary (Signed)
[name], [name]                ACCOUNT NO.:  [id number]   MEDICAL RECORD NO.:  [id number]          PATIENT TYPE:  INP   LOCATION:  2020                         FACILITY:  [facility]   PHYSICIAN:  Valentina Gu. Roxy Manns, M.D. DATE OF BIRTH:  1951/02/24   DATE OF ADMISSION:  03/22/2004  DATE OF DISCHARGE:  03/31/2004                                 DISCHARGE SUMMARY   DATE OF ANTICIPATED DISCHARGE:  March 31, 2004.   PRIMARY ADMISSION DIAGNOSIS:  Chest pain.   ADDITIONAL/DISCHARGE DIAGNOSES:  1.  Severe three-vessel coronary artery disease.  2.  Unstable angina.  3.  Hypertension.  4.  History of cerebrovascular accident around one year ago.  5.  Type 2 diabetes mellitus, poorly controlled.  6.  Morbid obesity.  7.  Hyperlipidemia.  8.  Hypothyroidism.  9.  History of recent Bell's palsy approximately six weeks ago.  10. Questionable history of obstructive sleep apnea.   PROCEDURES PERFORMED:  1.  Cardiac catheterization.  2.  Coronary artery bypass grafting x 3 (left internal mammary artery to      first diagonal, saphenous vein graft to the circumflex marginal,      saphenous vein graft to the posterior descending artery).  3.  Endoscopic vein harvest, right thigh.   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  The patient  was relieved with rest within five minutes.  She had a second episode later  in the week associated with some fairly strenuous activity.  This episode  was somewhat prolonged, prompting her to visit the emergency room for  further evaluation.  She was seen in the ER by the family practice teaching  service and was admitted for further evaluation and treatment.   HOSPITAL COURSE:  She was seen in consultation by cardiology and  subsequently ruled out for a myocardial infarction.  She did have  a  borderline elevated troponin which was consistent with unstable angina.  It  was Dr. Evette Georges recommendation that she proceed with cardiac catheterization  to further delineate her anatomy.  She was taken to the catheterization  laboratory on March 24, 2004, and was found to have severe three-vessel  coronary artery disease with preserved left ventricular function.  Specifically, she was noted to have an 80-90% proximal stenosis of the LAD,  a 90% proximal stenosis of the first diagonal, a 70% stenosis of the distal  LAD beyond the takeoff of the first diagonal branch, a 50-60% proximal  stenosis of the left circumflex and a 100% occlusion of the mid right  coronary artery.  There were signs or symptoms of left ventricular  hypertrophy with no wall motion abnormalities.  Because of her multivessel  disease.  A cardiothoracic consultation was obtained.  Valentina Gu. Roxy Manns,  M.D., saw the patient and reviewed her films and agreed that her best course  of action would be to proceed with surgical revascularization.  After a  discussion with the  patient of the risks, benefits and alternatives, she  agreed to proceed.  She was taken to the operating room on March 26, 2004,  after remaining pain-free in the hospital during her preoperative workup.  She underwent CABG x 3 as described in detail above.  She tolerated the  procedure well and was transferred to the SICU in stable condition.  She was  able to be extubated shortly after surgery.  She was hemodynamically stable  and doing well on postoperative day #1.  Initially she had some mild  weakness and numbness of her right hand which she felt were similar symptoms  to her previous CVA.  Her neurologic exam was within normal limits.  She was  observed closely and remained in the unit for 24 hours observation.  Her  right hand was significantly improved within the next 48 hours.  The  vascular integrity of the hand and arm remained intact and  she was  neurologically stable.  By postoperative day #2, she was ready for transfer  to the floor.  She has been started on a beta blocker and an ACE inhibitor,  as well as diuresis.  Postoperatively she has done fairly well.  Her major  postoperative difficulty has been poorly controlled diabetes.  She  apparently was then transitioned with her primary care physician upon  admission and her medications had been adjusted just prior to her  presentation.  Her hemoglobin A1C was 9.9 on admission, including poor  glycemic control.  She initially was maintained on the Glucommander protocol  and was switched to Lantus insulin on postoperative day #1.  Once her p.o.  intake improved, she was restarted on  her home medication regimen of  Avandamet and Humalog b.i.d.  Despite these measures, her sugars have  remained poorly controlled, running in the 200-300 range.  She has since  then been started on Lantus 40 units q.h.s. in addition to her home  medications.  Her sugars will be observed closely over the next 24 hours and  if she continues to have problems, her medications will need further  adjustment.  She will also need careful outpatient followup and we will try  to arrange this with the family practice teaching service prior to her  discharge.  Otherwise she has done well.  She is ambulating in the halls  without difficulty.  She is tolerating a regular diet and having normal  bowel and bladder function.  She has remained afebrile and all vital signs  have been stable.  She has been weaned from supplemental oxygen, maintaining  O2 saturations of 90% or greater on room air.  Her surgical incision sites  are all healing well.  Her most recent laboratories show a hemoglobin of  11.4, hematocrit 32.2, white count 9.6, platelets 323, potassium 4.4, sodium  138, BUN 17 and creatinine 1.1.  She is ambulating in the halls without difficulty.  It was felt that if she remained stable and her blood  sugars  stabilize on new medication regimen that she will possibly be ready for  discharge home on March 31, 2004.  If there is any problem with her blood  sugars on morning rounds, she will be kept an additional day for further  adjustment of her medications.   DISCHARGE MEDICATIONS:  1.  Enteric-coated aspirin 81 mg daily.  2.  Lopressor 25 mg q.8h.  3.  Prinizide 20/25 mg daily.  4.  Crestor 10 mg q.h.s.  5.  Levoxyl 200 mcg daily.  6.  Lantus 40 units q.h.s.  7.  Novolog 15 units b.i.d.  8.  Avandamet 4/500 mg b.i.d.  9.  Tylox one to two q.4h. p.r.n. for pain.   Her diabetes medications may be adjusted prior to discharge and if so, an  addendum will be dictated.   ACTIVITY:  She is asked to refrain from driving, heavy lifting or strenuous  activity.  She may continue ambulating daily and using her incentive  spirometer.   DIET:  She may continue a low-fat, low-sodium, carbohydrate-modified diet.   WOUND CARE:  She may shower daily and clean her incisions with soap and  water.   DISCHARGE FOLLOWUP:  She is asked to make an appointment to see Dr. Claiborne Billings in  two weeks for cardiac followup.  She will see Dr. Roxy Manns in three weeks and  our office will call to schedule this appointment, as well as a time for a  chest x-ray at the [facility] on the day of her  appointment with Dr. Roxy Manns.  Also,  outpatient followup with the family practice teaching service will be  arranged prior to discharge for further maintenance of her blood sugars.  If  she experiences any problems or has questions in the interim, she is asked  to contact our office immediately.       GC/MEDQ  D:  03/30/2004  T:  03/30/2004  Job:  [id number]   cc:   [facility]   CVTS Office

## 2010-10-24 NOTE — Cardiovascular Report (Signed)
NAMEJANEAN, PEDRAZA                ACCOUNT NO.:  [id number]   MEDICAL RECORD NO.:  [id number]          PATIENT TYPE:  INP   LOCATION:  4799                         FACILITY:  [facility]   PHYSICIAN:  Shelva Majestic, M.D.     DATE OF BIRTH:  Sep 30, 1950   DATE OF PROCEDURE:  03/24/2004  DATE OF DISCHARGE:                              CARDIAC CATHETERIZATION   PROCEDURE:  Cardiac catheterization.   CARDIOLOGIST:  Shelva Majestic, M.D.   INDICATIONS FOR PROCEDURE:  The patient is a 60 year old female with a  history of obesity and significant cardiac risk factors notable for  hypertension, poorly-controlled, poorly-controlled diabetes mellitus, a  strong family history of coronary artery disease, as well as significant  hyperlipidemia.  She also is status post remote CVA.  She was admitted to  the family medicine service on Saturday, March 22, 2004, with accelerated  hypertension and chest pain, suggesting possible unstable angina.  Subsequent troponins were positive at 0.26.  She is referred for a  definitive diagnostic cardiac catheterization.  Of note, the  electrocardiogram did show diffuse T-wave inversion in lead I and lead V2  through V6.   DESCRIPTION OF PROCEDURE:  After premedication with Versed 2 mg  intravenously, the patient was prepped and draped in the usual fashion.  The  right femoral artery was punctured anteriorly and a 5-French sheath was  inserted.  A diagnostic cardiac catheterization was done with a 5-French,  Judkins 4 left and right coronary catheters.  The right catheter was used  for selective angiography in the left subclavian artery LIMA system, since  the patient will most likely require coronary artery bypass graft surgery  revascularization surgery.  A 5-French pigtail catheter was used for biplane  left ventriculography as well as a distal aortography.  Hemostasis was  obtained by direct manual pressure.  The patient tolerated the procedure  well.   HEMODYNAMIC DATA:  Central aortic pressure:  161/82, mean 113.  Left ventricular pressure:  161/18, post A-wave 29.   ANGIOGRAPHIC DATA:  1.  LEFT MAIN CORONARY ARTERY:  The left main coronary artery was      angiographically normal and bifurcated into an LAD and the left      circumflex system.  2.  LEFT ANTERIOR DESCENDING CORONARY ARTERY:  The proximal left anterior      descending coronary artery had a 95% stenosis before the first septal      perforating artery.  The LAD in the region of the diagonal vessel and      mid-portion had diffuse 80%-90% stenosis.  There was 95% stenosis in a      large twin-like diagonal vessel.  3.  CIRCUMFLEX CORONARY ARTERY:  The circumflex coronary artery had proximal      60%-70% narrowing before the first marginal vessel, 40%-50% narrowing      after the first marginal vessel, and there was an 80% stenosis in the      inferior branch in the marginal vessel.  There also was diffuse stenosis      of 95% in a small distal obtuse marginal branch.  Extensive      collateralization of the PLA/PDA-like system originating from the right      coronary artery was seen via the left injection.  4.  RIGHT CORONARY ARTERY:  The right coronary artery had proximal 90%      stenosis, followed by a total occlusion in its proximal to mid-segment.      There were faint antegrade collaterals but as noted above, predominant      collaterals were left to right, supplying the distal PDA/PLA system.  5.  LEFT SUBCLAVIAN AND INTERNAL MAMMARY ARTERY:  Were notable and suitable      for coronary artery bypass graft surgery revascularization surgery.   BIPLANE CINE LEFT VENTRICULOGRAPHY:  Revealed concentric left ventricular  hypertrophy with a catenoid-appearing ventricle.  The ejection fraction was  hyperdynamic at least 70%, without focal segmental wall motion abnormalities  on the RAO projection.  On the LAO projection there was a subtle  inferolateral minimal  hypocontractility.   DISTAL AORTOGRAPHY:  This did not demonstrate any aneurysmal dilatation of  the aorta.  There was no significant aortoiliac disease.   IMPRESSION:  1.  Concentric left ventricular hypertrophy with a catenoid-shaped ventricle      with normal systolic function, and a suggestion of a mild inferolateral      hypocontractility.  2.  Severe multivessel coronary artery disease with 95% proximal, diffuse      80%-90% mid-left anterior descending coronary artery stenosis with 95%      stenosis in a large diagonal system, 60%-70% proximal circumflex artery      stenosis with 50% stenosis in the AV groove circumflex, and distal      obtuse marginal stenosis of 95% diffusely, with an 80% branch stenosis      in the obtuse marginal-I vessel, with a total occlusion of the right      coronary artery with faint antegrade and extensive left to right      collaterals.   RECOMMENDATIONS:  The patient has diffuse there-vessel coronary artery  disease, with preserved global contractility.  Coronary artery bypass graft  surgery revascularization surgery will be recommended.       TK/MEDQ  D:  03/24/2004  T:  03/24/2004  Job:  [id number]   cc:   Family Medicine Service   Cardiac Cath Lab [facility]

## 2010-10-24 NOTE — Assessment & Plan Note (Signed)
[facility]                             PULMONARY OFFICE NOTE   CLEMMIE, [name]                       MRN:          [id number]  DATE:05/18/2006                            DOB:          09/24/1950    SLEEP MEDICINE FOLLOWUP.   SUBJECTIVE:  [name] comes in today to discuss treatment of her  obstructive sleep apnea.  The patient has been tried on multiple devices  for positive pressure as well as different masks and continues to have  significant intolerance.  She feels that she cannot stand anything on  her face and that she cannot even tolerate a very low expiratory  pressure on the device.  It is fairly clear to her that she is not going  to be able to wear this for treatment of her obstructive sleep apnea.   PHYSICAL EXAM:  The BP is 146/88, pulse 85, temperature is 98, weight is  295 pounds, O2 saturation on room air is 96%.  There is no evidence of  skin breakdown or pressure necrosis from the CPAP mask.   IMPRESSION:  Severe obstructive sleep apnea with intolerance of  continuous positive airway pressure.  We have tried different things in  order to make it more comfortable for her, and she feels very strongly  that this is not going to be a therapy that she can live with.  I have  also discussed with her upper airway surgery which will improve her  sleep apnea about 40-50% but will not cure this degree of sleep apnea.  Patient also is pursuing bariatric surgery.  I have gone over again the  significant risk from a cardiovascular standpoint over time given the  degree of her sleep apnea.   PLAN:  1. The patient wishes no further treatment of her obstructive sleep      apnea for now other than bariatric surgery.  Again I have      reiterated to her the cardiovascular risk over time.  2. The patient will follow up after she has had bariatric surgery and      has had significant weight loss so we can reassess.     [name],  MD,FCCP  Electronically Signed    [facility]  DD: 05/18/2006  DT: 05/18/2006  Job #: [id number]   cc:   [name]. Hoxworth, M.D.

## 2010-11-28 ENCOUNTER — Encounter: Payer: Self-pay | Admitting: Cardiovascular Disease

## 2011-02-11 ENCOUNTER — Other Ambulatory Visit: Payer: Self-pay | Admitting: General Practice

## 2011-02-27 LAB — CBC
HCT: 42.5
Hemoglobin: 13.9
MCHC: 35.2
MCHC: 35.2
MCV: 86.5
Platelets: 287
RBC: 5.08
RDW: 13.1
RDW: 13.2
WBC: 12.4 — ABNORMAL HIGH

## 2011-02-27 LAB — URINALYSIS, ROUTINE W REFLEX MICROSCOPIC
Glucose, UA: >1000 — AB
Hgb urine dipstick: NEGATIVE
Ketones, ur: NEGATIVE
Protein, ur: NEGATIVE

## 2011-02-27 LAB — COMPREHENSIVE METABOLIC PANEL
ALT: 21
AST: 16
Alkaline Phosphatase: 100
CO2: 21
Calcium: 9.7
GFR calc Af Amer: >60
GFR calc non Af Amer: 53 — ABNORMAL LOW
Potassium: 3.8
Sodium: 137
Total Protein: 6.6

## 2011-02-27 LAB — DIFFERENTIAL
Basophils Absolute: 0
Basophils Relative: 0
Eosinophils Absolute: 0
Eosinophils Absolute: 0.2
Eosinophils Absolute: 0.2
Eosinophils Relative: 0
Eosinophils Relative: 2
Eosinophils Relative: 2
Lymphs Abs: 1.2
Lymphs Abs: 1.9
Monocytes Absolute: 0.8
Monocytes Relative: 7
Neutro Abs: 6

## 2011-02-27 LAB — URINE MICROSCOPIC-ADD ON

## 2011-02-27 LAB — HEMOGLOBIN AND HEMATOCRIT, BLOOD: Hemoglobin: 13.6

## 2011-02-27 LAB — TYPE AND SCREEN
ABO/RH(D): B POS
Antibody Screen: NEGATIVE

## 2012-01-06 ENCOUNTER — Ambulatory Visit: Payer: Self-pay | Admitting: Orthopedic Surgery

## 2012-01-20 ENCOUNTER — Ambulatory Visit: Payer: Self-pay | Admitting: Cardiology

## 2012-01-25 ENCOUNTER — Ambulatory Visit: Payer: Self-pay | Admitting: Orthopedic Surgery

## 2012-01-25 LAB — CBC
HCT: 26.8 % — ABNORMAL LOW (ref 35.0–47.0)
HGB: 8.1 g/dL — ABNORMAL LOW (ref 12.0–16.0)
MCH: 21 pg — ABNORMAL LOW (ref 26.0–34.0)
MCV: 70 fL — ABNORMAL LOW (ref 80–100)
Platelet: 322 10*3/uL (ref 150–440)
RBC: 3.83 10*6/uL (ref 3.80–5.20)
RDW: 17.8 % — ABNORMAL HIGH (ref 11.5–14.5)

## 2012-01-25 LAB — SEDIMENTATION RATE: Erythrocyte Sed Rate: 62 mm/hr — ABNORMAL HIGH (ref 0–30)

## 2012-01-25 LAB — BASIC METABOLIC PANEL
BUN: 21 mg/dL — ABNORMAL HIGH (ref 7–18)
Calcium, Total: 8.5 mg/dL (ref 8.5–10.1)
Chloride: 110 mmol/L — ABNORMAL HIGH (ref 98–107)
EGFR (Non-African Amer.): 33 — ABNORMAL LOW
Osmolality: 288 (ref 275–301)
Potassium: 4.3 mmol/L (ref 3.5–5.1)

## 2012-01-25 LAB — PROTIME-INR: INR: 1

## 2012-01-25 LAB — MRSA PCR SCREENING

## 2012-02-02 ENCOUNTER — Inpatient Hospital Stay: Payer: Self-pay | Admitting: Orthopedic Surgery

## 2012-02-03 LAB — BASIC METABOLIC PANEL
Anion Gap: 7 (ref 7–16)
Chloride: 113 mmol/L — ABNORMAL HIGH (ref 98–107)
Co2: 24 mmol/L (ref 21–32)
Creatinine: 1.58 mg/dL — ABNORMAL HIGH (ref 0.60–1.30)
Potassium: 4.4 mmol/L (ref 3.5–5.1)
Sodium: 144 mmol/L (ref 136–145)

## 2012-02-03 LAB — PLATELET COUNT: Platelet: 227 10*3/uL (ref 150–440)

## 2012-02-03 LAB — HEMOGLOBIN: HGB: 8.4 g/dL — ABNORMAL LOW (ref 12.0–16.0)

## 2012-02-04 LAB — HEMOGLOBIN: HGB: 8.2 g/dL — ABNORMAL LOW (ref 12.0–16.0)

## 2012-02-05 LAB — HEMOGLOBIN: HGB: 8.3 g/dL — ABNORMAL LOW (ref 12.0–16.0)

## 2012-02-16 ENCOUNTER — Encounter: Payer: Self-pay | Admitting: Orthopedic Surgery

## 2012-03-08 ENCOUNTER — Encounter: Payer: Self-pay | Admitting: Orthopedic Surgery

## 2012-04-08 ENCOUNTER — Encounter: Payer: Self-pay | Admitting: Orthopedic Surgery

## 2012-05-08 ENCOUNTER — Encounter: Payer: Self-pay | Admitting: Orthopedic Surgery

## 2012-09-09 ENCOUNTER — Telehealth: Payer: Self-pay | Admitting: Family Medicine

## 2012-09-09 MED ORDER — LEVOTHYROXINE SODIUM 200 MCG PO TABS: 200.0000 ug | ORAL_TABLET | Freq: Every day | ORAL | Status: DC

## 2012-09-09 NOTE — Telephone Encounter (Signed)
rx refilled.

## 2012-11-07 ENCOUNTER — Telehealth: Payer: Self-pay | Admitting: Family Medicine

## 2012-11-07 MED ORDER — ROSUVASTATIN CALCIUM 10 MG PO TABS: 10.0000 mg | ORAL_TABLET | Freq: Every day | ORAL | Status: DC

## 2012-11-07 MED ORDER — HYDROCHLOROTHIAZIDE 12.5 MG PO TABS: 12.5000 mg | ORAL_TABLET | Freq: Every day | ORAL | Status: DC

## 2012-11-07 NOTE — Telephone Encounter (Signed)
Rx Refilled  

## 2013-01-25 ENCOUNTER — Ambulatory Visit (INDEPENDENT_AMBULATORY_CARE_PROVIDER_SITE_OTHER): Payer: 59 | Admitting: Family Medicine

## 2013-01-25 ENCOUNTER — Encounter: Payer: Self-pay | Admitting: Family Medicine

## 2013-01-25 ENCOUNTER — Telehealth: Payer: Self-pay | Admitting: Family Medicine

## 2013-01-25 VITALS — BP 180/78 | HR 74 | Temp 99.0°F | Ht 66.0 in | Wt 234.0 lb

## 2013-01-25 DIAGNOSIS — J209 Acute bronchitis, unspecified: Secondary | ICD-10-CM

## 2013-01-25 DIAGNOSIS — I10 Essential (primary) hypertension: Secondary | ICD-10-CM

## 2013-01-25 MED ORDER — PREDNISONE 10 MG PO TABS: ORAL_TABLET | ORAL | Status: DC

## 2013-01-25 MED ORDER — AZITHROMYCIN 250 MG PO TABS: ORAL_TABLET | ORAL | Status: DC

## 2013-01-25 MED ORDER — ALBUTEROL SULFATE HFA 108 (90 BASE) MCG/ACT IN AERS: 2.0000 | INHALATION_SPRAY | RESPIRATORY_TRACT | Status: DC | PRN

## 2013-01-25 NOTE — Progress Notes (Signed)
  Subjective:    Patient ID: [name], female    DOB: [birth year], 62 y.o.   MRN: [id number]  HPI  Pt here with cough with wheeze, sore throat and fatigue for the past 5 days. Works as a  Quarry manager at [employer], was exposed to sick patient with URI symptoms. Her cough and fatigue has been worsening. She has taken a dose of claritin only due to her blood pressure. Note no BP meds taken this morning. She has noticed some wheezing, but denies SOB. She is a non smoker.   Review of Systems  GEN- + fatigue, fever, weight loss,weakness, recent illness HEENT- denies eye drainage, change in vision, nasal discharge, CVS- denies chest pain, palpitations RESP- denies SOB, +cough,+ wheeze ABD- denies N/V, change in stools, abd pain Neuro- denies headache, dizziness, syncope, seizure activity      Objective:   Physical Exam GEN- NAD, alert and oriented x3  Repeat BP 180/96 HEENT- PERRL, EOMI, non injected sclera, pink conjunctiva, MMM, oropharynx mild injection, TM clear bilat no effusion, no maxillary sinus tenderness, inflammed turbinates,  Nasal drainage  Neck- Supple, + LAD CVS- RRR, no murmur RESP-course rhonchi and wheeze, normal WOB,  EXT- No edema Pulses- Radial 2+         Assessment & Plan:

## 2013-01-25 NOTE — Patient Instructions (Addendum)
Treatment for bronchitis Take antibiotics, steroids as directed Use inhaler Return if you do not improve or go to ER Push fluids Take BPmeds

## 2013-01-26 DIAGNOSIS — J209 Acute bronchitis, unspecified: Secondary | ICD-10-CM | POA: Insufficient documentation

## 2013-01-26 MED ORDER — ATORVASTATIN CALCIUM 20 MG PO TABS: 20.0000 mg | ORAL_TABLET | Freq: Every day | ORAL | Status: DC

## 2013-01-26 NOTE — Assessment & Plan Note (Signed)
Treat with low dose prednisone she is diet controlled diabetic Zpak, inhaler Push fluids Off from work

## 2013-01-26 NOTE — Telephone Encounter (Signed)
Switch to lipitor 20 poqday

## 2013-01-26 NOTE — Telephone Encounter (Signed)
Rx Refilled  

## 2013-01-26 NOTE — Assessment & Plan Note (Signed)
Reminded to take BP meds as soon as she gets home No decongestants

## 2013-02-07 ENCOUNTER — Telehealth: Payer: Self-pay | Admitting: Family Medicine

## 2013-02-07 MED ORDER — POTASSIUM CHLORIDE ER 10 MEQ PO CPCR: 10.0000 meq | ORAL_CAPSULE | Freq: Every day | ORAL | Status: DC

## 2013-02-07 NOTE — Telephone Encounter (Signed)
Rx Refilled  

## 2013-02-07 NOTE — Telephone Encounter (Signed)
Potassium CL ER 20 meq tab 1 QD #90

## 2013-03-24 ENCOUNTER — Telehealth: Payer: Self-pay | Admitting: Family Medicine

## 2013-03-24 NOTE — Telephone Encounter (Signed)
Patient needs someone to call her regarding her medicine refills.

## 2013-03-27 MED ORDER — HYDROCHLOROTHIAZIDE 12.5 MG PO TABS: 12.5000 mg | ORAL_TABLET | Freq: Every day | ORAL | Status: DC

## 2013-03-27 MED ORDER — HYDRALAZINE HCL 25 MG PO TABS: 25.0000 mg | ORAL_TABLET | Freq: Three times a day (TID) | ORAL | Status: DC

## 2013-03-27 MED ORDER — AMLODIPINE BESYLATE 10 MG PO TABS: 10.0000 mg | ORAL_TABLET | Freq: Every day | ORAL | Status: DC

## 2013-03-27 MED ORDER — LEVOTHYROXINE SODIUM 200 MCG PO TABS: 200.0000 ug | ORAL_TABLET | Freq: Every day | ORAL | Status: DC

## 2013-03-27 MED ORDER — POTASSIUM CHLORIDE ER 10 MEQ PO CPCR: 10.0000 meq | ORAL_CAPSULE | Freq: Every day | ORAL | Status: DC

## 2013-03-27 MED ORDER — ATORVASTATIN CALCIUM 20 MG PO TABS: 20.0000 mg | ORAL_TABLET | Freq: Every day | ORAL | Status: DC

## 2013-03-27 MED ORDER — METOPROLOL TARTRATE 50 MG PO TABS: 50.0000 mg | ORAL_TABLET | Freq: Two times a day (BID) | ORAL | Status: DC

## 2013-03-27 MED ORDER — BENAZEPRIL HCL 40 MG PO TABS: 40.0000 mg | ORAL_TABLET | Freq: Every day | ORAL | Status: DC

## 2013-03-27 NOTE — Telephone Encounter (Signed)
meds refilled for 90 day supply per pt request

## 2013-08-04 ENCOUNTER — Telehealth: Payer: Self-pay | Admitting: Family Medicine

## 2013-08-04 NOTE — Telephone Encounter (Signed)
LMTRC

## 2013-08-04 NOTE — Telephone Encounter (Signed)
Call back number is [phone] Do not refill until you speak to pt!!! Pharmacy [facility] [phone]) She is needing generic forms on all of the medications She is needing refills on all of these amLODipine (NORVASC) 10 MG tablet (to expensive she needs something less expensive) benazepril (LOTENSIN) 40 MG tablet hydrALAZINE (APRESOLINE) 25 MG tablet hydrochlorothiazide (HYDRODIURIL) 12.5 MG tablet levothyroxine (SYNTHROID, LEVOTHROID) 200 MCG tablet metoprolol (LOPRESSOR) 50 MG tablet potassium chloride (MICRO-K) 10 MEQ CR capsule  (to expensive she needs something less expensvie) atorvastatin (LIPITOR) 20 MG tablet  (to expensive she needs something less expensvie)

## 2013-08-08 NOTE — Telephone Encounter (Signed)
I haven't seen patient in 1 year.  Switch lipitor to pravastatin40 qd.   Oherwise needs to be seenm.

## 2013-08-09 MED ORDER — METOPROLOL TARTRATE 50 MG PO TABS: 50.0000 mg | ORAL_TABLET | Freq: Two times a day (BID) | ORAL | Status: DC

## 2013-08-09 MED ORDER — POTASSIUM CHLORIDE ER 10 MEQ PO TBCR: 10.0000 meq | EXTENDED_RELEASE_TABLET | Freq: Every day | ORAL | Status: DC

## 2013-08-09 MED ORDER — BENAZEPRIL HCL 40 MG PO TABS: 40.0000 mg | ORAL_TABLET | Freq: Every day | ORAL | Status: DC

## 2013-08-09 MED ORDER — HYDROCHLOROTHIAZIDE 12.5 MG PO TABS: 12.5000 mg | ORAL_TABLET | Freq: Every day | ORAL | Status: DC

## 2013-08-09 MED ORDER — LEVOTHYROXINE SODIUM 200 MCG PO TABS: 200.0000 ug | ORAL_TABLET | Freq: Every day | ORAL | Status: DC

## 2013-08-09 MED ORDER — HYDRALAZINE HCL 25 MG PO TABS: 25.0000 mg | ORAL_TABLET | Freq: Three times a day (TID) | ORAL | Status: DC

## 2013-08-09 MED ORDER — PRAVASTATIN SODIUM 40 MG PO TABS: 40.0000 mg | ORAL_TABLET | Freq: Every day | ORAL | Status: DC

## 2013-08-09 NOTE — Telephone Encounter (Signed)
Refilled medications and change Lipitor to Pravastatin 40mg  - pt has an appt for CPE scheduled for 08/21/13. Informed pt that this appt must be kept in order to change medications.

## 2013-08-18 ENCOUNTER — Other Ambulatory Visit: Payer: No Typology Code available for payment source

## 2013-08-18 DIAGNOSIS — Z79899 Other long term (current) drug therapy: Secondary | ICD-10-CM

## 2013-08-18 DIAGNOSIS — I2510 Atherosclerotic heart disease of native coronary artery without angina pectoris: Secondary | ICD-10-CM

## 2013-08-18 DIAGNOSIS — Z0000 Encounter for general adult medical examination without abnormal findings: Secondary | ICD-10-CM

## 2013-08-18 DIAGNOSIS — I10 Essential (primary) hypertension: Secondary | ICD-10-CM

## 2013-08-18 LAB — COMPLETE METABOLIC PANEL WITH GFR
ALBUMIN: 3.5 g/dL (ref 3.5–5.2)
ALK PHOS: 148 U/L — AB (ref 39–117)
ALT: 11 U/L (ref 0–35)
AST: 13 U/L (ref 0–37)
BUN: 36 mg/dL — AB (ref 6–23)
CO2: 24 mEq/L (ref 19–32)
Calcium: 8.3 mg/dL — ABNORMAL LOW (ref 8.4–10.5)
Chloride: 110 mEq/L (ref 96–112)
Creat: 1.76 mg/dL — ABNORMAL HIGH (ref 0.50–1.10)
GFR, Est African American: 35 mL/min — ABNORMAL LOW
GFR, Est Non African American: 30 mL/min — ABNORMAL LOW
Glucose, Bld: 155 mg/dL — ABNORMAL HIGH (ref 70–99)
POTASSIUM: 4.5 meq/L (ref 3.5–5.3)
SODIUM: 142 meq/L (ref 135–145)
TOTAL PROTEIN: 5.8 g/dL — AB (ref 6.0–8.3)
Total Bilirubin: 0.4 mg/dL (ref 0.2–1.2)

## 2013-08-18 LAB — LIPID PANEL
Cholesterol: 138 mg/dL (ref 0–200)
HDL: 39 mg/dL — ABNORMAL LOW (ref >39)
LDL Cholesterol: 80 mg/dL (ref 0–99)
Total CHOL/HDL Ratio: 3.5 Ratio
Triglycerides: 94 mg/dL (ref <150)
VLDL: 19 mg/dL (ref 0–40)

## 2013-08-18 LAB — CBC WITH DIFFERENTIAL/PLATELET
BASOS ABS: 0.1 10*3/uL (ref 0.0–0.1)
Basophils Relative: 1 % (ref 0–1)
EOS PCT: 4 % (ref 0–5)
Eosinophils Absolute: 0.3 10*3/uL (ref 0.0–0.7)
HCT: 30.1 % — ABNORMAL LOW (ref 36.0–46.0)
Hemoglobin: 9 g/dL — CL (ref 12.0–15.0)
Lymphocytes Relative: 25 % (ref 12–46)
Lymphs Abs: 1.7 10*3/uL (ref 0.7–4.0)
MCH: 22 pg — ABNORMAL LOW (ref 26.0–34.0)
MCHC: 29.9 g/dL — ABNORMAL LOW (ref 30.0–36.0)
MCV: 73.6 fL — ABNORMAL LOW (ref 78.0–100.0)
MONO ABS: 0.6 10*3/uL (ref 0.1–1.0)
Monocytes Relative: 9 % (ref 3–12)
Neutro Abs: 4.2 10*3/uL (ref 1.7–7.7)
Neutrophils Relative %: 61 % (ref 43–77)
PLATELETS: 324 10*3/uL (ref 150–400)
RBC: 4.09 MIL/uL (ref 3.87–5.11)
RDW: 18 % — AB (ref 11.5–15.5)
WBC: 6.9 10*3/uL (ref 4.0–10.5)

## 2013-08-18 LAB — TSH: TSH: 0.286 u[IU]/mL — AB (ref 0.350–4.500)

## 2013-08-19 LAB — VITAMIN D 25 HYDROXY (VIT D DEFICIENCY, FRACTURES): Vit D, 25-Hydroxy: 31 ng/mL (ref 30–89)

## 2013-08-21 ENCOUNTER — Ambulatory Visit (INDEPENDENT_AMBULATORY_CARE_PROVIDER_SITE_OTHER): Payer: No Typology Code available for payment source | Admitting: Family Medicine

## 2013-08-21 ENCOUNTER — Encounter: Payer: Self-pay | Admitting: Family Medicine

## 2013-08-21 ENCOUNTER — Encounter: Payer: Self-pay | Admitting: *Deleted

## 2013-08-21 VITALS — BP 160/100 | HR 60 | Temp 97.6°F | Resp 16 | Ht 67.0 in | Wt 228.5 lb

## 2013-08-21 DIAGNOSIS — Z0000 Encounter for general adult medical examination without abnormal findings: Secondary | ICD-10-CM

## 2013-08-21 DIAGNOSIS — N289 Disorder of kidney and ureter, unspecified: Secondary | ICD-10-CM

## 2013-08-21 DIAGNOSIS — E785 Hyperlipidemia, unspecified: Secondary | ICD-10-CM

## 2013-08-21 DIAGNOSIS — D649 Anemia, unspecified: Secondary | ICD-10-CM

## 2013-08-21 DIAGNOSIS — IMO0001 Reserved for inherently not codable concepts without codable children: Secondary | ICD-10-CM

## 2013-08-21 DIAGNOSIS — E1165 Type 2 diabetes mellitus with hyperglycemia: Secondary | ICD-10-CM

## 2013-08-21 DIAGNOSIS — R0989 Other specified symptoms and signs involving the circulatory and respiratory systems: Secondary | ICD-10-CM

## 2013-08-21 DIAGNOSIS — I10 Essential (primary) hypertension: Secondary | ICD-10-CM

## 2013-08-21 DIAGNOSIS — E039 Hypothyroidism, unspecified: Secondary | ICD-10-CM

## 2013-08-21 LAB — VITAMIN B12: Vitamin B-12: 247 pg/mL (ref 211–911)

## 2013-08-21 LAB — FOLATE: Folate: >20 ng/mL

## 2013-08-21 LAB — IRON: Iron: 23 ug/dL — ABNORMAL LOW (ref 42–145)

## 2013-08-21 LAB — FERRITIN: Ferritin: 4 ng/mL — ABNORMAL LOW (ref 10–291)

## 2013-08-21 MED ORDER — PRAVASTATIN SODIUM 40 MG PO TABS: 40.0000 mg | ORAL_TABLET | Freq: Every day | ORAL | Status: DC

## 2013-08-21 MED ORDER — LEVOTHYROXINE SODIUM 100 MCG PO TABS: 100.0000 ug | ORAL_TABLET | Freq: Every day | ORAL | Status: DC

## 2013-08-21 MED ORDER — LEVOTHYROXINE SODIUM 88 MCG PO TABS: 88.0000 ug | ORAL_TABLET | Freq: Every day | ORAL | Status: DC

## 2013-08-21 MED ORDER — DOXAZOSIN MESYLATE 4 MG PO TABS: 4.0000 mg | ORAL_TABLET | Freq: Every day | ORAL | Status: DC

## 2013-08-21 MED ORDER — AMLODIPINE BESYLATE 10 MG PO TABS: 10.0000 mg | ORAL_TABLET | Freq: Every day | ORAL | Status: DC

## 2013-08-21 NOTE — Progress Notes (Signed)
Subjective:    Patient ID: [name], female    DOB: [birth year], 63 y.o.   MRN: [id number]  HPI Patient has not been seen in quite some time. She is here today for complete physical exam. She is overdue for colonoscopy as well as mammogram. She has a history of a hysterectomy and therefore does not require Pap smears. Her blood pressure is out of control at 160/100. She has a history of coronary artery disease. Her goal blood pressure be less than 140/90. On her recent lab renal function has worsened significantly to 1.75. Her thyroid test shows that she is taking a super therapeutic amount of levothyroxin. The patient has developed a microcytic anemia with an elevated RDW. I do not have a recent hemoglobin A1c on a diabetic patient. On examination today she also has developed a right carotid bruit. Remarkably her a review of systems is otherwise negative. Past Medical History  Diagnosis Date  . HTN (hypertension)   . Diabetes mellitus   . Hashimoto thyroiditis     w subsequent hypothyroidism  . Cerebrovascular accident   . Hyperlipidemia    Past Surgical History  Procedure Laterality Date  . Abdominal hysterectomy    . Tonsillectomy    . Bairiatric surgery      loss over 100 pounds  . Coronary artery bypass graft     Current Outpatient Prescriptions on File Prior to Visit  Medication Sig Dispense Refill  . albuterol (PROVENTIL HFA;VENTOLIN HFA) 108 (90 BASE) MCG/ACT inhaler Inhale 2 puffs into the lungs every 4 (four) hours as needed for wheezing.  1 Inhaler  2  . aspirin 81 MG EC tablet Take 81 mg by mouth daily.        . benazepril (LOTENSIN) 40 MG tablet Take 1 tablet (40 mg total) by mouth daily.  30 tablet  1  . hydrALAZINE (APRESOLINE) 25 MG tablet Take 1 tablet (25 mg total) by mouth 3 (three) times daily.  90 tablet  1  . hydrochlorothiazide (HYDRODIURIL) 12.5 MG tablet Take 1 tablet (12.5 mg total) by mouth daily.  30 tablet  1  . levothyroxine (SYNTHROID, LEVOTHROID)  200 MCG tablet Take 1 tablet (200 mcg total) by mouth daily before breakfast. = 225 mcg  30 tablet  1  . metoprolol (LOPRESSOR) 50 MG tablet Take 1 tablet (50 mg total) by mouth 2 (two) times daily.  60 tablet  1  . potassium chloride (K-DUR) 10 MEQ tablet Take 1 tablet (10 mEq total) by mouth daily.  30 tablet  3  . pravastatin (PRAVACHOL) 40 MG tablet Take 1 tablet (40 mg total) by mouth daily.  30 tablet  3  . levothyroxine (SYNTHROID, LEVOTHROID) 25 MCG tablet Take 25 mcg by mouth daily before breakfast. =225 mcg       No current facility-administered medications on file prior to visit.   Allergies  Allergen Reactions  . Morphine And Related Nausea And Vomiting   History   Social History  . Marital Status: Single    Spouse Name: N/A    Number of Children: N/A  . Years of Education: N/A   Occupational History  . Not on file.   Social History Main Topics  . Smoking status: Never Smoker   . Smokeless tobacco: Not on file  . Alcohol Use: No  . Drug Use:   . Sexual Activity:    Other Topics Concern  . Not on file   Social History Narrative   Full time.  Family History  Problem Relation Age of Onset  . Coronary artery disease Other   . Stroke Other   . Diabetes Other       Review of Systems  All other systems reviewed and are negative.       Objective:   Physical Exam  Vitals reviewed. Constitutional: She is oriented to person, place, and time. She appears well-developed and well-nourished. No distress.  HENT:  Head: Normocephalic and atraumatic.  Right Ear: External ear normal.  Left Ear: External ear normal.  Nose: Nose normal.  Mouth/Throat: Oropharynx is clear and moist. No oropharyngeal exudate.  Eyes: Conjunctivae and EOM are normal. Pupils are equal, round, and reactive to light. Right eye exhibits no discharge. Left eye exhibits no discharge. No scleral icterus.  Neck: Normal range of motion. Neck supple. No JVD present. No thyromegaly present.    Cardiovascular: Normal rate, regular rhythm, normal heart sounds and intact distal pulses.  Exam reveals no gallop and no friction rub.   No murmur heard. Pulmonary/Chest: Effort normal and breath sounds normal. No respiratory distress. She has no wheezes. She has no rales. She exhibits no tenderness.  Abdominal: Soft. Bowel sounds are normal. She exhibits no distension and no mass. There is no tenderness. There is no rebound and no guarding.  Musculoskeletal: Normal range of motion. She exhibits no edema and no tenderness.  Lymphadenopathy:    She has no cervical adenopathy.  Neurological: She is alert and oriented to person, place, and time. She has normal reflexes. She displays normal reflexes. No cranial nerve deficit. She exhibits normal muscle tone. Coordination normal.  Skin: Skin is warm. No rash noted. She is not diaphoretic. No erythema. No pallor.  Psychiatric: She has a normal mood and affect. Her behavior is normal. Judgment and thought content normal.   patient has a right carotid bruit        Assessment & Plan:  1. Acute renal insufficiency Patient's renal function has declined. Therefore I recommended she discontinue hydrochlorothiazide. Also recommended she discontinue potassium. I believe her renal function has declined likely due to uncontrolled hypertension as well as uncontrolled diabetes mellitus type 2.  2. Anemia Patient has a microcytic anemia with an elevated RDW. I will check lab work to evaluate potential cause and also schedule patient for a GI consultation for colonoscopy and likely an EGD the - Vitamin B12 - Iron - Fecal occult blood, imunochemical - Folate - Ferritin - Fecal occult blood, imunochemical - Fecal occult blood, imunochemical  3. HTN (hypertension) Discontinue hydrochlorothiazide. Begin Cardura 4 mg by mouth daily and amlodipine 10 mg by mouth daily and recheck blood pressure in 2 weeks  4. HLD (hyperlipidemia) Patient has been off her  statin for several months resume pravastatin 40 mg by mouth daily. Goal LDL is less than 70.  5. Unspecified hypothyroidism Decrease levothyroxine to 188 mcg by mouth daily and recheck TSH in 6 weeks. - levothyroxine (SYNTHROID, LEVOTHROID) 100 MCG tablet; Take 1 tablet (100 mcg total) by mouth daily.  Dispense: 90 tablet; Refill: 3 - levothyroxine (SYNTHROID, LEVOTHROID) 88 MCG tablet; Take 1 tablet (88 mcg total) by mouth daily.  Dispense: 90 tablet; Refill: 3  6. Routine general medical examination at a health care facility Patient is admitted for colonoscopy and mammogram and I will schedule those. - Ambulatory referral to Gastroenterology - MM Digital Screening; Future  7. Type II or unspecified type diabetes mellitus without mention of complication, uncontrolled Check hemoglobin A1c. A1c is less than 6.5  is greater than 6.5, I recommend starting the patient on Tradjenta given her renal function. - HgB A1c  8. Right carotid bruit Patient is currently taking an aspirin 81 mg by mouth daily by mouth schedule patient for a carotid ultrasound..  patient's LDL is 80 but she has been out of pravastatin for several months. I recommend she resume pravastatin 40 mg by mouth daily for goal LDL less than 70. - US Carotid Duplex Bilateral; Future Lab on 08/18/2013  Component Date Value Ref Range Status  . WBC 08/18/2013 6.9  4.0 - 10.5 K/uL Final  . RBC 08/18/2013 4.09  3.87 - 5.11 MIL/uL Final  . Hemoglobin 08/18/2013 9.0* 12.0 - 15.0 g/dL Corrected   Comment: Result repeated and verified.                                                     Amended report.                          REPEATED ON ANOTHER ANALYZER HGB=9.2, CLINICALLY INSIGNIFICANT                          DIFFERENCE.  Marland Kitchen HCT 08/18/2013 30.1* 36.0 - 46.0 % Final  . MCV 08/18/2013 73.6* 78.0 - 100.0 fL Final  . MCH 08/18/2013 22.0* 26.0 - 34.0 pg Final  . MCHC 08/18/2013 29.9* 30.0 - 36.0 g/dL Final  . RDW 08/18/2013 18.0* 11.5 -  15.5 % Final  . Platelets 08/18/2013 324  150 - 400 K/uL Final  . Neutrophils Relative % 08/18/2013 61  43 - 77 % Final  . Neutro Abs 08/18/2013 4.2  1.7 - 7.7 K/uL Final  . Lymphocytes Relative 08/18/2013 25  12 - 46 % Final  . Lymphs Abs 08/18/2013 1.7  0.7 - 4.0 K/uL Final  . Monocytes Relative 08/18/2013 9  3 - 12 % Final  . Monocytes Absolute 08/18/2013 0.6  0.1 - 1.0 K/uL Final  . Eosinophils Relative 08/18/2013 4  0 - 5 % Final  . Eosinophils Absolute 08/18/2013 0.3  0.0 - 0.7 K/uL Final  . Basophils Relative 08/18/2013 1  0 - 1 % Final  . Basophils Absolute 08/18/2013 0.1  0.0 - 0.1 K/uL Final  . Smear Review 08/18/2013 Criteria for review not met   Final  . Cholesterol 08/18/2013 138  0 - 200 mg/dL Final   Comment: ATP III Classification:                                < 200        mg/dL        Desirable                               200 - 239     mg/dL        Borderline High                               >= 240        mg/dL        High                             .  Triglycerides 08/18/2013 94  <150 mg/dL Final  . HDL 08/18/2013 39* >39 mg/dL Final  . Total CHOL/HDL Ratio 08/18/2013 3.5   Final  . VLDL 08/18/2013 19  0 - 40 mg/dL Final  . LDL Cholesterol 08/18/2013 80  0 - 99 mg/dL Final   Comment:                            Total Cholesterol/HDL Ratio:CHD Risk                                                 Coronary Heart Disease Risk Table                                                                 Men       Women                                   1/2 Average Risk              3.4        3.3                                       Average Risk              5.0        4.4                                    2X Average Risk              9.6        7.1                                    3X Average Risk             23.4       11.0                          Use the calculated Patient Ratio above and the CHD Risk table                           to determine the patient's CHD  Risk.                          ATP III Classification (LDL):                                < 100        mg/dL         Optimal  100 - 129     mg/dL         Near or Above Optimal                               130 - 159     mg/dL         Borderline High                               160 - 189     mg/dL         High                                > 190        mg/dL         Very High                             . TSH 08/18/2013 0.286* 0.350 - 4.500 uIU/mL Final  . Vit D, 25-Hydroxy 08/18/2013 31  30 - 89 ng/mL Final   Comment: This assay accurately quantifies Vitamin D, which is the sum of the                          25-Hydroxy forms of Vitamin D2 and D3.  Studies have shown that the                          optimum concentration of 25-Hydroxy Vitamin D is 30 ng/mL or higher.                           Concentrations of Vitamin D between 20 and 29 ng/mL are considered to                          be insufficient and concentrations less than 20 ng/mL are considered                          to be deficient for Vitamin D.  . Sodium 08/18/2013 142  135 - 145 mEq/L Final  . Potassium 08/18/2013 4.5  3.5 - 5.3 mEq/L Final  . Chloride 08/18/2013 110  96 - 112 mEq/L Final  . CO2 08/18/2013 24  19 - 32 mEq/L Final  . Glucose, Bld 08/18/2013 155* 70 - 99 mg/dL Final  . BUN 08/18/2013 36* 6 - 23 mg/dL Final  . Creat 08/18/2013 1.76* 0.50 - 1.10 mg/dL Final  . Total Bilirubin 08/18/2013 0.4  0.2 - 1.2 mg/dL Final  . Alkaline Phosphatase 08/18/2013 148* 39 - 117 U/L Final  . AST 08/18/2013 13  0 - 37 U/L Final  . ALT 08/18/2013 11  0 - 35 U/L Final  . Total Protein 08/18/2013 5.8* 6.0 - 8.3 g/dL Final  . Albumin 08/18/2013 3.5  3.5 - 5.2 g/dL Final  . Calcium 08/18/2013 8.3* 8.4 - 10.5 mg/dL Final  . GFR, Est African American 08/18/2013 35*  Final  . GFR, Est Non African American 08/18/2013 30*  Final   Comment:  The estimated GFR is a calculation  valid for adults (>=22 years old)                          that uses the CKD-EPI algorithm to adjust for age and sex. It is                            not to be used for children, pregnant women, hospitalized patients,                             patients on dialysis, or with rapidly changing kidney function.                          According to the NKDEP, eGFR >89 is normal, 60-89 shows mild                          impairment, 30-59 shows moderate impairment, 15-29 shows severe                          impairment and <15 is ESRD.

## 2013-08-24 ENCOUNTER — Telehealth: Payer: Self-pay | Admitting: *Deleted

## 2013-08-24 NOTE — Telephone Encounter (Signed)
Pt called statingshe believes the new medication that was prescribed to her Cardura is causing her feeling dizzy, nauseous, pt was advised to stop taking medication for a couple days and if she feels better then she will need to have her medication changed again or adjusted, pt has appointment on Monday for follow up she will talk to doctor then about medication.

## 2013-08-25 ENCOUNTER — Other Ambulatory Visit: Payer: No Typology Code available for payment source

## 2013-08-26 LAB — FECAL OCCULT BLOOD, IMMUNOCHEMICAL
FECAL OCCULT BLOOD: POSITIVE — AB
Fecal Occult Blood: POSITIVE — AB
Fecal Occult Blood: POSITIVE — AB

## 2013-08-29 ENCOUNTER — Telehealth: Payer: Self-pay | Admitting: Family Medicine

## 2013-08-29 MED ORDER — CLONIDINE HCL 0.1 MG PO TABS: 0.1000 mg | ORAL_TABLET | Freq: Two times a day (BID) | ORAL | Status: DC

## 2013-08-29 NOTE — Telephone Encounter (Signed)
Pt called on 08/24/13 - unfortunately the note was closed and not sent to provider - the note was as follows: Pt called statingshe believes the new medication that was prescribed to her Cardura is causing her feeling dizzy, nauseous, pt was advised to stop taking medication for a couple days and if she feels better then she will need to have her medication changed again or adjusted, pt has appointment on Monday for follow up she will talk to doctor then about medication.   Pt called back to day stating that she is having a severe headache and she stopped the Cardura due to the side effects. She is wanting something else called in to help with the BP and HA's, she does have an appt 08/31/13 for a F/U.  Per Dr. Dennard Schaumann pt to start Clonidine .1mg  bid and keep appt for Thursday.  Pt aware and med sent to pharm.

## 2013-08-31 ENCOUNTER — Ambulatory Visit (INDEPENDENT_AMBULATORY_CARE_PROVIDER_SITE_OTHER): Payer: No Typology Code available for payment source | Admitting: Family Medicine

## 2013-08-31 ENCOUNTER — Encounter: Payer: Self-pay | Admitting: Family Medicine

## 2013-08-31 VITALS — BP 120/60 | HR 44 | Temp 98.2°F | Resp 16 | Ht 67.0 in | Wt 234.0 lb

## 2013-08-31 DIAGNOSIS — R5383 Other fatigue: Principal | ICD-10-CM

## 2013-08-31 DIAGNOSIS — I10 Essential (primary) hypertension: Secondary | ICD-10-CM

## 2013-08-31 DIAGNOSIS — D509 Iron deficiency anemia, unspecified: Secondary | ICD-10-CM

## 2013-08-31 DIAGNOSIS — I498 Other specified cardiac arrhythmias: Secondary | ICD-10-CM

## 2013-08-31 DIAGNOSIS — R5381 Other malaise: Secondary | ICD-10-CM

## 2013-08-31 DIAGNOSIS — R001 Bradycardia, unspecified: Secondary | ICD-10-CM

## 2013-08-31 NOTE — Progress Notes (Signed)
Subjective:    Patient ID: [name], female    DOB: March 05, 1951, 63 y.o.   MRN: [id number]  HPI 08/21/13 Patient has not been seen in quite some time. She is here today for complete physical exam. She is overdue for colonoscopy as well as mammogram. She has a history of a hysterectomy and therefore does not require Pap smears. Her blood pressure is out of control at 160/100. She has a history of coronary artery disease. Her goal blood pressure be less than 140/90. On her recent lab renal function has worsened significantly to 1.75. Her thyroid test shows that she is taking a super therapeutic amount of levothyroxin. The patient has developed a microcytic anemia with an elevated RDW. I do not have a recent hemoglobin A1c on a diabetic patient. On examination today she also has developed a right carotid bruit. Remarkably her a review of systems is otherwise negative.  At that time, my plan was: 1. Acute renal insufficiency Patient's renal function has declined. Therefore I recommended she discontinue hydrochlorothiazide. Also recommended she discontinue potassium. I believe her renal function has declined likely due to uncontrolled hypertension as well as uncontrolled diabetes mellitus type 2.  2. Anemia Patient has a microcytic anemia with an elevated RDW. I will check lab work to evaluate potential cause and also schedule patient for a GI consultation for colonoscopy and likely an EGD the - Vitamin B12 - Iron - Fecal occult blood, imunochemical - Folate - Ferritin - Fecal occult blood, imunochemical - Fecal occult blood, imunochemical  3. HTN (hypertension) Discontinue hydrochlorothiazide. Begin Cardura 4 mg by mouth daily and amlodipine 10 mg by mouth daily and recheck blood pressure in 2 weeks  4. HLD (hyperlipidemia) Patient has been off her statin for several months resume pravastatin 40 mg by mouth daily. Goal LDL is less than 70.  5. Unspecified hypothyroidism Decrease  levothyroxine to 188 mcg by mouth daily and recheck TSH in 6 weeks. - levothyroxine (SYNTHROID, LEVOTHROID) 100 MCG tablet; Take 1 tablet (100 mcg total) by mouth daily.  Dispense: 90 tablet; Refill: 3 - levothyroxine (SYNTHROID, LEVOTHROID) 88 MCG tablet; Take 1 tablet (88 mcg total) by mouth daily.  Dispense: 90 tablet; Refill: 3  6. Routine general medical examination at a health care facility Patient is admitted for colonoscopy and mammogram and I will schedule those. - Ambulatory referral to Gastroenterology - MM Digital Screening; Future  7. Type II or unspecified type diabetes mellitus without mention of complication, uncontrolled Check hemoglobin A1c. A1c is less than 6.5 is greater than 6.5, I recommend starting the patient on Tradjenta given her renal function. - HgB A1c  8. Right carotid bruit Patient is currently taking an aspirin 81 mg by mouth daily by mouth schedule patient for a carotid ultrasound..  patient's LDL is 80 but she has been out of pravastatin for several months. I recommend she resume pravastatin 40 mg by mouth daily for goal LDL less than 70. - US Carotid Duplex Bilateral; Future  08/31/13 After adding digoxin Zosyn, the patient reported extreme orthostatic dizziness and one episode of passing out when she stood up. The patient discontinue digoxin Zosyn and the symptoms completely stopped. Due to her significantly elevated blood pressure I then put the patient on clonidine 0.1 mg by mouth twice a day. Today she presents complaining of fatigue. She denies any syncope or presyncope. However her heart rate is extremely at 44 beats per minute. She denies any chest pain or shortness of breath.  Her blood pressure is much improved at 120/60 however.  Her labs at the last office visit are listed below: Office Visit on 08/21/2013  Component Date Value Ref Range Status  . Vitamin B-12 08/21/2013 247  211 - 911 pg/mL Final  . Iron 08/21/2013 23* 42 - 145 ug/dL Final  . Fecal  Occult Blood 08/25/2013 POS* Negative Final  . Folate 08/21/2013 >20.0   Final   Comment:                            Reference Ranges                                  Deficient:       0.4 - 3.3 ng/mL                                  Indeterminate:   3.4 - 5.4 ng/mL                                  Normal:              > 5.4 ng/mL                             . Ferritin 08/21/2013 4* 10 - 291 ng/mL Final  . Fecal Occult Blood 08/25/2013 POS* Negative Final  . Fecal Occult Blood 08/25/2013 POS* Negative Final  Lab on 08/18/2013  Component Date Value Ref Range Status  . WBC 08/18/2013 6.9  4.0 - 10.5 K/uL Final  . RBC 08/18/2013 4.09  3.87 - 5.11 MIL/uL Final  . Hemoglobin 08/18/2013 9.0* 12.0 - 15.0 g/dL Corrected   Comment: Result repeated and verified.                                                     Amended report.                          REPEATED ON ANOTHER ANALYZER HGB=9.2, CLINICALLY INSIGNIFICANT                          DIFFERENCE.  Marland Kitchen HCT 08/18/2013 30.1* 36.0 - 46.0 % Final  . MCV 08/18/2013 73.6* 78.0 - 100.0 fL Final  . MCH 08/18/2013 22.0* 26.0 - 34.0 pg Final  . MCHC 08/18/2013 29.9* 30.0 - 36.0 g/dL Final  . RDW 08/18/2013 18.0* 11.5 - 15.5 % Final  . Platelets 08/18/2013 324  150 - 400 K/uL Final  . Neutrophils Relative % 08/18/2013 61  43 - 77 % Final  . Neutro Abs 08/18/2013 4.2  1.7 - 7.7 K/uL Final  . Lymphocytes Relative 08/18/2013 25  12 - 46 % Final  . Lymphs Abs 08/18/2013 1.7  0.7 - 4.0 K/uL Final  . Monocytes Relative 08/18/2013 9  3 - 12 % Final  . Monocytes Absolute 08/18/2013 0.6  0.1 - 1.0 K/uL Final  . Eosinophils Relative 08/18/2013 4  0 - 5 % Final  .  Eosinophils Absolute 08/18/2013 0.3  0.0 - 0.7 K/uL Final  . Basophils Relative 08/18/2013 1  0 - 1 % Final  . Basophils Absolute 08/18/2013 0.1  0.0 - 0.1 K/uL Final  . Smear Review 08/18/2013 Criteria for review not met   Final  . Cholesterol 08/18/2013 138  0 - 200 mg/dL Final   Comment: ATP III  Classification:                                < 200        mg/dL        Desirable                               200 - 239     mg/dL        Borderline High                               >= 240        mg/dL        High                             . Triglycerides 08/18/2013 94  <150 mg/dL Final  . HDL 08/18/2013 39* >39 mg/dL Final  . Total CHOL/HDL Ratio 08/18/2013 3.5   Final  . VLDL 08/18/2013 19  0 - 40 mg/dL Final  . LDL Cholesterol 08/18/2013 80  0 - 99 mg/dL Final   Comment:                            Total Cholesterol/HDL Ratio:CHD Risk                                                 Coronary Heart Disease Risk Table                                                                 Men       Women                                   1/2 Average Risk              3.4        3.3                                       Average Risk              5.0        4.4  2X Average Risk              9.6        7.1                                    3X Average Risk             23.4       11.0                          Use the calculated Patient Ratio above and the CHD Risk table                           to determine the patient's CHD Risk.                          ATP III Classification (LDL):                                < 100        mg/dL         Optimal                               100 - 129     mg/dL         Near or Above Optimal                               130 - 159     mg/dL         Borderline High                               160 - 189     mg/dL         High                                > 190        mg/dL         Very High                             . TSH 08/18/2013 0.286* 0.350 - 4.500 uIU/mL Final  . Vit D, 25-Hydroxy 08/18/2013 31  30 - 89 ng/mL Final   Comment: This assay accurately quantifies Vitamin D, which is the sum of the                          25-Hydroxy forms of Vitamin D2 and D3.  Studies have shown that the                           optimum concentration of 25-Hydroxy Vitamin D is 30 ng/mL or higher.                           Concentrations of Vitamin D between 20 and 29  ng/mL are considered to                          be insufficient and concentrations less than 20 ng/mL are considered                          to be deficient for Vitamin D.  . Sodium 08/18/2013 142  135 - 145 mEq/L Final  . Potassium 08/18/2013 4.5  3.5 - 5.3 mEq/L Final  . Chloride 08/18/2013 110  96 - 112 mEq/L Final  . CO2 08/18/2013 24  19 - 32 mEq/L Final  . Glucose, Bld 08/18/2013 155* 70 - 99 mg/dL Final  . BUN 08/18/2013 36* 6 - 23 mg/dL Final  . Creat 08/18/2013 1.76* 0.50 - 1.10 mg/dL Final  . Total Bilirubin 08/18/2013 0.4  0.2 - 1.2 mg/dL Final  . Alkaline Phosphatase 08/18/2013 148* 39 - 117 U/L Final  . AST 08/18/2013 13  0 - 37 U/L Final  . ALT 08/18/2013 11  0 - 35 U/L Final  . Total Protein 08/18/2013 5.8* 6.0 - 8.3 g/dL Final  . Albumin 08/18/2013 3.5  3.5 - 5.2 g/dL Final  . Calcium 08/18/2013 8.3* 8.4 - 10.5 mg/dL Final  . GFR, Est African American 08/18/2013 35*  Final  . GFR, Est Non African American 08/18/2013 30*  Final   Comment:                            The estimated GFR is a calculation valid for adults (>=78 years old)                          that uses the CKD-EPI algorithm to adjust for age and sex. It is                            not to be used for children, pregnant women, hospitalized patients,                             patients on dialysis, or with rapidly changing kidney function.                          According to the NKDEP, eGFR >89 is normal, 60-89 shows mild                          impairment, 30-59 shows moderate impairment, 15-29 shows severe                          impairment and <15 is ESRD.                              The patient was found to have an iron deficient microcytic anemia with guaiac positive stools. I have recommended a GI consultation and iron supplementation. This is most likely  also contributing to her fatigue and dizziness coupled with her low blood pressure and her decreased heart rate.  Past Medical History  Diagnosis Date  . HTN (hypertension)   . Diabetes mellitus   . Hashimoto thyroiditis     w subsequent hypothyroidism  .  Cerebrovascular accident   . Hyperlipidemia    Past Surgical History  Procedure Laterality Date  . Abdominal hysterectomy    . Tonsillectomy    . Bairiatric surgery      loss over 100 pounds  . Coronary artery bypass graft     Current Outpatient Prescriptions on File Prior to Visit  Medication Sig Dispense Refill  . albuterol (PROVENTIL HFA;VENTOLIN HFA) 108 (90 BASE) MCG/ACT inhaler Inhale 2 puffs into the lungs every 4 (four) hours as needed for wheezing.  1 Inhaler  2  . amLODipine (NORVASC) 10 MG tablet Take 1 tablet (10 mg total) by mouth daily.  90 tablet  3  . aspirin 81 MG EC tablet Take 81 mg by mouth daily.        . benazepril (LOTENSIN) 40 MG tablet Take 1 tablet (40 mg total) by mouth daily.  30 tablet  1  . Cholecalciferol (VITAMIN D3) 1000 UNITS CAPS Take by mouth.      . cloNIDine (CATAPRES) 0.1 MG tablet Take 1 tablet (0.1 mg total) by mouth 2 (two) times daily.  60 tablet  3  . Coenzyme Q10 (COQ10) 100 MG CAPS Take by mouth.      . hydrALAZINE (APRESOLINE) 25 MG tablet Take 1 tablet (25 mg total) by mouth 3 (three) times daily.  90 tablet  1  . hydrochlorothiazide (HYDRODIURIL) 12.5 MG tablet Take 1 tablet (12.5 mg total) by mouth daily.  30 tablet  1  . levothyroxine (SYNTHROID, LEVOTHROID) 100 MCG tablet Take 1 tablet (100 mcg total) by mouth daily.  90 tablet  3  . levothyroxine (SYNTHROID, LEVOTHROID) 200 MCG tablet Take 1 tablet (200 mcg total) by mouth daily before breakfast. = 225 mcg  30 tablet  1  . levothyroxine (SYNTHROID, LEVOTHROID) 25 MCG tablet Take 25 mcg by mouth daily before breakfast. =225 mcg      . levothyroxine (SYNTHROID, LEVOTHROID) 88 MCG tablet Take 1 tablet (88 mcg total) by mouth daily.   90 tablet  3  . metoprolol (LOPRESSOR) 50 MG tablet Take 1 tablet (50 mg total) by mouth 2 (two) times daily.  60 tablet  1  . Omega-3 Fatty Acids (FISH OIL) 1200 MG CAPS Take by mouth daily.      . potassium chloride (K-DUR) 10 MEQ tablet Take 1 tablet (10 mEq total) by mouth daily.  30 tablet  3  . pravastatin (PRAVACHOL) 40 MG tablet Take 1 tablet (40 mg total) by mouth daily.  30 tablet  3   No current facility-administered medications on file prior to visit.   Allergies  Allergen Reactions  . Cardura [Doxazosin Mesylate] Other (See Comments)    Dizzy with passing out spells  . Morphine And Related Nausea And Vomiting   History   Social History  . Marital Status: Single    Spouse Name: N/A    Number of Children: N/A  . Years of Education: N/A   Occupational History  . Not on file.   Social History Main Topics  . Smoking status: Never Smoker   . Smokeless tobacco: Not on file  . Alcohol Use: No  . Drug Use:   . Sexual Activity:    Other Topics Concern  . Not on file   Social History Narrative   Full time.    Family History  Problem Relation Age of Onset  . Coronary artery disease Other   . Stroke Other   . Diabetes Other  Review of Systems  All other systems reviewed and are negative.       Objective:   Physical Exam  Vitals reviewed. Constitutional: She is oriented to person, place, and time. She appears well-developed and well-nourished. No distress.  HENT:  Head: Normocephalic and atraumatic.  Right Ear: External ear normal.  Left Ear: External ear normal.  Nose: Nose normal.  Mouth/Throat: Oropharynx is clear and moist. No oropharyngeal exudate.  Eyes: Conjunctivae and EOM are normal. Pupils are equal, round, and reactive to light. Right eye exhibits no discharge. Left eye exhibits no discharge. No scleral icterus.  Neck: Normal range of motion. Neck supple. No JVD present. No thyromegaly present.  Cardiovascular: Normal rate, regular  rhythm, normal heart sounds and intact distal pulses.  Exam reveals no gallop and no friction rub.   No murmur heard. Pulmonary/Chest: Effort normal and breath sounds normal. No respiratory distress. She has no wheezes. She has no rales. She exhibits no tenderness.  Abdominal: Soft. Bowel sounds are normal. She exhibits no distension and no mass. There is no tenderness. There is no rebound and no guarding.  Musculoskeletal: Normal range of motion. She exhibits no edema and no tenderness.  Lymphadenopathy:    She has no cervical adenopathy.  Neurological: She is alert and oriented to person, place, and time. She has normal reflexes. No cranial nerve deficit. She exhibits normal muscle tone. Coordination normal.  Skin: Skin is warm. No rash noted. She is not diaphoretic. No erythema. No pallor.  Psychiatric: She has a normal mood and affect. Her behavior is normal. Judgment and thought content normal.   patient has a right carotid bruit        Assessment & Plan:  1. Other malaise and fatigue I believe this is due to a combination of iron deficiency anemia, bradycardia, and low blood pressure. I have addressed bradycardia by having the patient discontinue the Toprol.  I have contacted GI regarding  iron deficiency anemia so the patient may receive an EGD and colonoscopy. In the meantime I'll have the patient start iron supplementation.   2. Bradycardia EKG today in the office shows sinus bradycardia at 48 beats per minute. She has a left axis deviation. Otherwise intervals are within normal range. There is no AV nodal dissociation. Therefore, I will have the patient stop, Toprol and recheck her heart rate next week. I recommended that if she felt that she is going to pass out, she should go to the emergency room. - EKG 12-Lead  3. Anemia, iron deficiency Begin iron sulfate 325 mg by mouth twice a day and await GI consultation.  Hold aspirin until then.  4. HTN (hypertension) Blood pressure  is improved. I will discontinue the Toprol due to bradycardia.

## 2013-09-04 ENCOUNTER — Ambulatory Visit (INDEPENDENT_AMBULATORY_CARE_PROVIDER_SITE_OTHER): Payer: No Typology Code available for payment source | Admitting: Family Medicine

## 2013-09-04 ENCOUNTER — Encounter: Payer: Self-pay | Admitting: Family Medicine

## 2013-09-04 VITALS — BP 130/70 | HR 58 | Temp 97.1°F | Resp 18 | Ht 67.0 in | Wt 232.0 lb

## 2013-09-04 DIAGNOSIS — Z23 Encounter for immunization: Secondary | ICD-10-CM

## 2013-09-04 DIAGNOSIS — I498 Other specified cardiac arrhythmias: Secondary | ICD-10-CM

## 2013-09-04 DIAGNOSIS — R001 Bradycardia, unspecified: Secondary | ICD-10-CM

## 2013-09-04 DIAGNOSIS — I10 Essential (primary) hypertension: Secondary | ICD-10-CM

## 2013-09-04 DIAGNOSIS — R5383 Other fatigue: Secondary | ICD-10-CM

## 2013-09-04 DIAGNOSIS — R5381 Other malaise: Secondary | ICD-10-CM

## 2013-09-04 NOTE — Progress Notes (Signed)
Subjective:    Patient ID: [name], female    DOB: March 05, 1951, 63 y.o.   MRN: [id number]  HPI 08/21/13 Patient has not been seen in quite some time. She is here today for complete physical exam. She is overdue for colonoscopy as well as mammogram. She has a history of a hysterectomy and therefore does not require Pap smears. Her blood pressure is out of control at 160/100. She has a history of coronary artery disease. Her goal blood pressure be less than 140/90. On her recent lab renal function has worsened significantly to 1.75. Her thyroid test shows that she is taking a super therapeutic amount of levothyroxin. The patient has developed a microcytic anemia with an elevated RDW. I do not have a recent hemoglobin A1c on a diabetic patient. On examination today she also has developed a right carotid bruit. Remarkably her a review of systems is otherwise negative.  At that time, my plan was: 1. Acute renal insufficiency Patient's renal function has declined. Therefore I recommended she discontinue hydrochlorothiazide. Also recommended she discontinue potassium. I believe her renal function has declined likely due to uncontrolled hypertension as well as uncontrolled diabetes mellitus type 2.  2. Anemia Patient has a microcytic anemia with an elevated RDW. I will check lab work to evaluate potential cause and also schedule patient for a GI consultation for colonoscopy and likely an EGD the - Vitamin B12 - Iron - Fecal occult blood, imunochemical - Folate - Ferritin - Fecal occult blood, imunochemical - Fecal occult blood, imunochemical  3. HTN (hypertension) Discontinue hydrochlorothiazide. Begin Cardura 4 mg by mouth daily and amlodipine 10 mg by mouth daily and recheck blood pressure in 2 weeks  4. HLD (hyperlipidemia) Patient has been off her statin for several months resume pravastatin 40 mg by mouth daily. Goal LDL is less than 70.  5. Unspecified hypothyroidism Decrease  levothyroxine to 188 mcg by mouth daily and recheck TSH in 6 weeks. - levothyroxine (SYNTHROID, LEVOTHROID) 100 MCG tablet; Take 1 tablet (100 mcg total) by mouth daily.  Dispense: 90 tablet; Refill: 3 - levothyroxine (SYNTHROID, LEVOTHROID) 88 MCG tablet; Take 1 tablet (88 mcg total) by mouth daily.  Dispense: 90 tablet; Refill: 3  6. Routine general medical examination at a health care facility Patient is admitted for colonoscopy and mammogram and I will schedule those. - Ambulatory referral to Gastroenterology - MM Digital Screening; Future  7. Type II or unspecified type diabetes mellitus without mention of complication, uncontrolled Check hemoglobin A1c. A1c is less than 6.5 is greater than 6.5, I recommend starting the patient on Tradjenta given her renal function. - HgB A1c  8. Right carotid bruit Patient is currently taking an aspirin 81 mg by mouth daily by mouth schedule patient for a carotid ultrasound..  patient's LDL is 80 but she has been out of pravastatin for several months. I recommend she resume pravastatin 40 mg by mouth daily for goal LDL less than 70. - US Carotid Duplex Bilateral; Future  08/31/13 After adding digoxin Zosyn, the patient reported extreme orthostatic dizziness and one episode of passing out when she stood up. The patient discontinue digoxin Zosyn and the symptoms completely stopped. Due to her significantly elevated blood pressure I then put the patient on clonidine 0.1 mg by mouth twice a day. Today she presents complaining of fatigue. She denies any syncope or presyncope. However her heart rate is extremely at 44 beats per minute. She denies any chest pain or shortness of breath.  Her blood pressure is much improved at 120/60 however.  Her labs at the last office visit are listed below: Office Visit on 08/21/2013  Component Date Value Ref Range Status  . Vitamin B-12 08/21/2013 247  211 - 911 pg/mL Final  . Iron 08/21/2013 23* 42 - 145 ug/dL Final  . Fecal  Occult Blood 08/25/2013 POS* Negative Final  . Folate 08/21/2013 >20.0   Final   Comment:                            Reference Ranges                                  Deficient:       0.4 - 3.3 ng/mL                                  Indeterminate:   3.4 - 5.4 ng/mL                                  Normal:              > 5.4 ng/mL                             . Ferritin 08/21/2013 4* 10 - 291 ng/mL Final  . Fecal Occult Blood 08/25/2013 POS* Negative Final  . Fecal Occult Blood 08/25/2013 POS* Negative Final  Lab on 08/18/2013  Component Date Value Ref Range Status  . WBC 08/18/2013 6.9  4.0 - 10.5 K/uL Final  . RBC 08/18/2013 4.09  3.87 - 5.11 MIL/uL Final  . Hemoglobin 08/18/2013 9.0* 12.0 - 15.0 g/dL Corrected   Comment: Result repeated and verified.                                                     Amended report.                          REPEATED ON ANOTHER ANALYZER HGB=9.2, CLINICALLY INSIGNIFICANT                          DIFFERENCE.  Marland Kitchen HCT 08/18/2013 30.1* 36.0 - 46.0 % Final  . MCV 08/18/2013 73.6* 78.0 - 100.0 fL Final  . MCH 08/18/2013 22.0* 26.0 - 34.0 pg Final  . MCHC 08/18/2013 29.9* 30.0 - 36.0 g/dL Final  . RDW 08/18/2013 18.0* 11.5 - 15.5 % Final  . Platelets 08/18/2013 324  150 - 400 K/uL Final  . Neutrophils Relative % 08/18/2013 61  43 - 77 % Final  . Neutro Abs 08/18/2013 4.2  1.7 - 7.7 K/uL Final  . Lymphocytes Relative 08/18/2013 25  12 - 46 % Final  . Lymphs Abs 08/18/2013 1.7  0.7 - 4.0 K/uL Final  . Monocytes Relative 08/18/2013 9  3 - 12 % Final  . Monocytes Absolute 08/18/2013 0.6  0.1 - 1.0 K/uL Final  . Eosinophils Relative 08/18/2013 4  0 - 5 % Final  .  Eosinophils Absolute 08/18/2013 0.3  0.0 - 0.7 K/uL Final  . Basophils Relative 08/18/2013 1  0 - 1 % Final  . Basophils Absolute 08/18/2013 0.1  0.0 - 0.1 K/uL Final  . Smear Review 08/18/2013 Criteria for review not met   Final  . Cholesterol 08/18/2013 138  0 - 200 mg/dL Final   Comment: ATP III  Classification:                                < 200        mg/dL        Desirable                               200 - 239     mg/dL        Borderline High                               >= 240        mg/dL        High                             . Triglycerides 08/18/2013 94  <150 mg/dL Final  . HDL 08/18/2013 39* >39 mg/dL Final  . Total CHOL/HDL Ratio 08/18/2013 3.5   Final  . VLDL 08/18/2013 19  0 - 40 mg/dL Final  . LDL Cholesterol 08/18/2013 80  0 - 99 mg/dL Final   Comment:                            Total Cholesterol/HDL Ratio:CHD Risk                                                 Coronary Heart Disease Risk Table                                                                 Men       Women                                   1/2 Average Risk              3.4        3.3                                       Average Risk              5.0        4.4  2X Average Risk              9.6        7.1                                    3X Average Risk             23.4       11.0                          Use the calculated Patient Ratio above and the CHD Risk table                           to determine the patient's CHD Risk.                          ATP III Classification (LDL):                                < 100        mg/dL         Optimal                               100 - 129     mg/dL         Near or Above Optimal                               130 - 159     mg/dL         Borderline High                               160 - 189     mg/dL         High                                > 190        mg/dL         Very High                             . TSH 08/18/2013 0.286* 0.350 - 4.500 uIU/mL Final  . Vit D, 25-Hydroxy 08/18/2013 31  30 - 89 ng/mL Final   Comment: This assay accurately quantifies Vitamin D, which is the sum of the                          25-Hydroxy forms of Vitamin D2 and D3.  Studies have shown that the                           optimum concentration of 25-Hydroxy Vitamin D is 30 ng/mL or higher.                           Concentrations of Vitamin D between 20 and 29  ng/mL are considered to                          be insufficient and concentrations less than 20 ng/mL are considered                          to be deficient for Vitamin D.  . Sodium 08/18/2013 142  135 - 145 mEq/L Final  . Potassium 08/18/2013 4.5  3.5 - 5.3 mEq/L Final  . Chloride 08/18/2013 110  96 - 112 mEq/L Final  . CO2 08/18/2013 24  19 - 32 mEq/L Final  . Glucose, Bld 08/18/2013 155* 70 - 99 mg/dL Final  . BUN 08/18/2013 36* 6 - 23 mg/dL Final  . Creat 08/18/2013 1.76* 0.50 - 1.10 mg/dL Final  . Total Bilirubin 08/18/2013 0.4  0.2 - 1.2 mg/dL Final  . Alkaline Phosphatase 08/18/2013 148* 39 - 117 U/L Final  . AST 08/18/2013 13  0 - 37 U/L Final  . ALT 08/18/2013 11  0 - 35 U/L Final  . Total Protein 08/18/2013 5.8* 6.0 - 8.3 g/dL Final  . Albumin 08/18/2013 3.5  3.5 - 5.2 g/dL Final  . Calcium 08/18/2013 8.3* 8.4 - 10.5 mg/dL Final  . GFR, Est African American 08/18/2013 35*  Final  . GFR, Est Non African American 08/18/2013 30*  Final   Comment:                            The estimated GFR is a calculation valid for adults (>=7 years old)                          that uses the CKD-EPI algorithm to adjust for age and sex. It is                            not to be used for children, pregnant women, hospitalized patients,                             patients on dialysis, or with rapidly changing kidney function.                          According to the NKDEP, eGFR >89 is normal, 60-89 shows mild                          impairment, 30-59 shows moderate impairment, 15-29 shows severe                          impairment and <15 is ESRD.                             08/31/13 The patient was found to have an iron deficient microcytic anemia with guaiac positive stools. I have recommended a GI consultation and iron supplementation. This is most  likely also contributing to her fatigue and dizziness coupled with her low blood pressure and her decreased heart rate. At that time, my plan was: 1. Other malaise and fatigue I believe this is due to a combination of iron deficiency anemia, bradycardia, and low blood pressure.  I have addressed bradycardia by having the patient discontinue the Toprol.  I have contacted GI regarding  iron deficiency anemia so the patient may receive an EGD and colonoscopy. In the meantime I'll have the patient start iron supplementation.   2. Bradycardia EKG today in the office shows sinus bradycardia at 48 beats per minute. She has a left axis deviation. Otherwise intervals are within normal range. There is no AV nodal dissociation. Therefore, I will have the patient stop, Toprol and recheck her heart rate next week. I recommended that if she felt that she is going to pass out, she should go to the emergency room. - EKG 12-Lead  3. Anemia, iron deficiency Begin iron sulfate 325 mg by mouth twice a day and await GI consultation.  Hold aspirin until then.  4. HTN (hypertension) Blood pressure is improved. I will discontinue the Toprol due to bradycardia.  09/04/13 Patient is here today for a recheck. Her blood pressure remains well controlled. Her heart rate has improved to 58 beats per minute off metoprolol.  She is not taking aspirin due to guaiac positive stool. She still has not seen GI. Overall she felt much better. She denies any syncope or presyncope. She denies any further dizziness. The malaise and fatigue are also improving   Past Medical History  Diagnosis Date  . HTN (hypertension)   . Diabetes mellitus   . Hashimoto thyroiditis     w subsequent hypothyroidism  . Cerebrovascular accident   . Hyperlipidemia    Past Surgical History  Procedure Laterality Date  . Abdominal hysterectomy    . Tonsillectomy    . Bairiatric surgery      loss over 100 pounds  . Coronary artery bypass graft      Current Outpatient Prescriptions on File Prior to Visit  Medication Sig Dispense Refill  . albuterol (PROVENTIL HFA;VENTOLIN HFA) 108 (90 BASE) MCG/ACT inhaler Inhale 2 puffs into the lungs every 4 (four) hours as needed for wheezing.  1 Inhaler  2  . amLODipine (NORVASC) 10 MG tablet Take 1 tablet (10 mg total) by mouth daily.  90 tablet  3  . aspirin 81 MG EC tablet Take 81 mg by mouth daily.        . benazepril (LOTENSIN) 40 MG tablet Take 1 tablet (40 mg total) by mouth daily.  30 tablet  1  . Cholecalciferol (VITAMIN D3) 1000 UNITS CAPS Take by mouth.      . cloNIDine (CATAPRES) 0.1 MG tablet Take 1 tablet (0.1 mg total) by mouth 2 (two) times daily.  60 tablet  3  . Coenzyme Q10 (COQ10) 100 MG CAPS Take by mouth.      . hydrALAZINE (APRESOLINE) 25 MG tablet Take 1 tablet (25 mg total) by mouth 3 (three) times daily.  90 tablet  1  . hydrochlorothiazide (HYDRODIURIL) 12.5 MG tablet Take 1 tablet (12.5 mg total) by mouth daily.  30 tablet  1  . levothyroxine (SYNTHROID, LEVOTHROID) 100 MCG tablet Take 1 tablet (100 mcg total) by mouth daily.  90 tablet  3  . levothyroxine (SYNTHROID, LEVOTHROID) 200 MCG tablet Take 1 tablet (200 mcg total) by mouth daily before breakfast. = 225 mcg  30 tablet  1  . levothyroxine (SYNTHROID, LEVOTHROID) 25 MCG tablet Take 25 mcg by mouth daily before breakfast. =225 mcg      . levothyroxine (SYNTHROID, LEVOTHROID) 88 MCG tablet Take 1 tablet (88 mcg total) by mouth daily.  90 tablet  3  .  Omega-3 Fatty Acids (FISH OIL) 1200 MG CAPS Take by mouth daily.      . potassium chloride (K-DUR) 10 MEQ tablet Take 1 tablet (10 mEq total) by mouth daily.  30 tablet  3  . pravastatin (PRAVACHOL) 40 MG tablet Take 1 tablet (40 mg total) by mouth daily.  30 tablet  3   No current facility-administered medications on file prior to visit.   Allergies  Allergen Reactions  . Cardura [Doxazosin Mesylate] Other (See Comments)    Dizzy with passing out spells  . Morphine  And Related Nausea And Vomiting   History   Social History  . Marital Status: Single    Spouse Name: N/A    Number of Children: N/A  . Years of Education: N/A   Occupational History  . Not on file.   Social History Main Topics  . Smoking status: Never Smoker   . Smokeless tobacco: Not on file  . Alcohol Use: No  . Drug Use:   . Sexual Activity:    Other Topics Concern  . Not on file   Social History Narrative   Full time.    Family History  Problem Relation Age of Onset  . Coronary artery disease Other   . Stroke Other   . Diabetes Other       Review of Systems  All other systems reviewed and are negative.       Objective:   Physical Exam  Vitals reviewed. Constitutional: She is oriented to person, place, and time. She appears well-developed and well-nourished. No distress.  HENT:  Head: Normocephalic and atraumatic.  Right Ear: External ear normal.  Left Ear: External ear normal.  Nose: Nose normal.  Mouth/Throat: Oropharynx is clear and moist. No oropharyngeal exudate.  Eyes: Conjunctivae and EOM are normal. Pupils are equal, round, and reactive to light. Right eye exhibits no discharge. Left eye exhibits no discharge. No scleral icterus.  Neck: Normal range of motion. Neck supple. No JVD present. No thyromegaly present.  Cardiovascular: Normal rate, regular rhythm, normal heart sounds and intact distal pulses.  Exam reveals no gallop and no friction rub.   No murmur heard. Pulmonary/Chest: Effort normal and breath sounds normal. No respiratory distress. She has no wheezes. She has no rales. She exhibits no tenderness.  Abdominal: Soft. Bowel sounds are normal. She exhibits no distension and no mass. There is no tenderness. There is no rebound and no guarding.  Musculoskeletal: Normal range of motion. She exhibits no edema and no tenderness.  Lymphadenopathy:    She has no cervical adenopathy.  Neurological: She is alert and oriented to person, place, and  time. She has normal reflexes. No cranial nerve deficit. She exhibits normal muscle tone. Coordination normal.  Skin: Skin is warm. No rash noted. She is not diaphoretic. No erythema. No pallor.  Psychiatric: She has a normal mood and affect. Her behavior is normal. Judgment and thought content normal.   patient has a right carotid bruit        Assessment & Plan:  Need for prophylactic vaccination and inoculation against unspecified single disease - Plan: Pneumococcal conjugate vaccine 13-valent IM  HTN (hypertension)  Bradycardia  Other malaise and fatigue   Continue current medications at the present dosages. I recommended the patient continue to refrain from using the Toprol due to bradycardia. Her blood pressure is now much better controlled. I recommended she continue to refrain from using aspirin until she is seen gastroenterology.

## 2013-09-13 ENCOUNTER — Encounter: Payer: Self-pay | Admitting: Internal Medicine

## 2013-09-13 ENCOUNTER — Other Ambulatory Visit: Payer: Self-pay | Admitting: Family Medicine

## 2013-09-13 MED ORDER — BENAZEPRIL HCL 40 MG PO TABS: 40.0000 mg | ORAL_TABLET | Freq: Every day | ORAL | Status: DC

## 2013-11-07 ENCOUNTER — Other Ambulatory Visit (INDEPENDENT_AMBULATORY_CARE_PROVIDER_SITE_OTHER): Payer: No Typology Code available for payment source

## 2013-11-07 ENCOUNTER — Encounter: Payer: Self-pay | Admitting: Internal Medicine

## 2013-11-07 ENCOUNTER — Ambulatory Visit (INDEPENDENT_AMBULATORY_CARE_PROVIDER_SITE_OTHER): Payer: No Typology Code available for payment source | Admitting: Internal Medicine

## 2013-11-07 VITALS — BP 124/84 | HR 64 | Ht 66.0 in | Wt 224.1 lb

## 2013-11-07 DIAGNOSIS — R195 Other fecal abnormalities: Secondary | ICD-10-CM | POA: Insufficient documentation

## 2013-11-07 DIAGNOSIS — K912 Postsurgical malabsorption, not elsewhere classified: Secondary | ICD-10-CM

## 2013-11-07 DIAGNOSIS — D509 Iron deficiency anemia, unspecified: Secondary | ICD-10-CM

## 2013-11-07 HISTORY — DX: Iron deficiency anemia, unspecified: D50.9

## 2013-11-07 HISTORY — DX: Postsurgical malabsorption, not elsewhere classified: K91.2

## 2013-11-07 HISTORY — DX: Other fecal abnormalities: R19.5

## 2013-11-07 LAB — CBC WITH DIFFERENTIAL/PLATELET
BASOS ABS: 0 10*3/uL (ref 0.0–0.1)
Basophils Relative: 0.4 % (ref 0.0–3.0)
Eosinophils Absolute: 0.1 10*3/uL (ref 0.0–0.7)
Eosinophils Relative: 2.3 % (ref 0.0–5.0)
HCT: 29.5 % — ABNORMAL LOW (ref 36.0–46.0)
HEMOGLOBIN: 9.3 g/dL — AB (ref 12.0–15.0)
LYMPHS PCT: 29.1 % (ref 12.0–46.0)
Lymphs Abs: 1.9 10*3/uL (ref 0.7–4.0)
MCHC: 31.6 g/dL (ref 30.0–36.0)
MCV: 75 fl — ABNORMAL LOW (ref 78.0–100.0)
MONOS PCT: 10.6 % (ref 3.0–12.0)
Monocytes Absolute: 0.7 10*3/uL (ref 0.1–1.0)
Neutro Abs: 3.7 10*3/uL (ref 1.4–7.7)
Neutrophils Relative %: 57.6 % (ref 43.0–77.0)
PLATELETS: 275 10*3/uL (ref 150.0–400.0)
RBC: 3.93 Mil/uL (ref 3.87–5.11)
RDW: 17.2 % — AB (ref 11.5–15.5)
WBC: 6.4 10*3/uL (ref 4.0–10.5)

## 2013-11-07 NOTE — Assessment & Plan Note (Signed)
B12 is low NL - to start po 1000ug daily - may get some absorption Will need B12 recheck later in a few months -

## 2013-11-07 NOTE — Assessment & Plan Note (Addendum)
Likely multi-factorial but suspect post-op malabsorption most likely. Needs colonoscopy and possible EGD given this and heme + The risks and benefits as well as alternatives of endoscopic procedure(s) have been discussed and reviewed. All questions answered. The patient agrees to proceed.

## 2013-11-07 NOTE — Patient Instructions (Addendum)
You have been scheduled for an endoscopy and colonoscopy with propofol. Please follow the written instructions given to you at your visit today. Please pick up your supplies at the pharmacy. If you use inhalers (even only as needed), please bring them with you on the day of your procedure.  Your physician has requested that you go to the basement for the following lab work before leaving today: CBC/diff  Please purchase and take Vitamin B- 12, 104mcg daily.   I appreciate the opportunity to care for you.    PrepTimes corrected on patient's instructions by ink pen.

## 2013-11-07 NOTE — Progress Notes (Signed)
Subjective:    Patient ID: [name], female    DOB: [birth year], 63 y.o.   MRN: [id number]  HPI The patient is here at the request of Dr. Dennard Schaumann because she has heme + stools and iron-deficiency anemia. She has had bariatric surgery (bypass - roux-en-Y) about 10 years ago - lost 100#. Denies overt bleeding and is not a blood donor. Her B12 was low NL GI ROS o/w negative Allergies  Allergen Reactions  . Cardura [Doxazosin Mesylate] Other (See Comments)    Dizzy with passing out spells  . Morphine And Related Nausea And Vomiting   Outpatient Prescriptions Prior to Visit  Medication Sig Dispense Refill  . amLODipine (NORVASC) 10 MG tablet Take 1 tablet (10 mg total) by mouth daily.  90 tablet  3  . benazepril (LOTENSIN) 40 MG tablet Take 1 tablet (40 mg total) by mouth daily.  30 tablet  11  . Cholecalciferol (VITAMIN D3) 1000 UNITS CAPS Take by mouth.      . cloNIDine (CATAPRES) 0.1 MG tablet Take 1 tablet (0.1 mg total) by mouth 2 (two) times daily.  60 tablet  3  . Coenzyme Q10 (COQ10) 100 MG CAPS Take by mouth.      . hydrALAZINE (APRESOLINE) 25 MG tablet Take 1 tablet (25 mg total) by mouth 3 (three) times daily.  90 tablet  1  . levothyroxine (SYNTHROID, LEVOTHROID) 100 MCG tablet Take 1 tablet (100 mcg total) by mouth daily.  90 tablet  3  . levothyroxine (SYNTHROID, LEVOTHROID) 88 MCG tablet Take 1 tablet (88 mcg total) by mouth daily.  90 tablet  3  . Omega-3 Fatty Acids (FISH OIL) 1200 MG CAPS Take by mouth daily.      . pravastatin (PRAVACHOL) 40 MG tablet Take 1 tablet (40 mg total) by mouth daily.  30 tablet  3  . albuterol (PROVENTIL HFA;VENTOLIN HFA) 108 (90 BASE) MCG/ACT inhaler Inhale 2 puffs into the lungs every 4 (four) hours as needed for wheezing.  1 Inhaler  2  . aspirin 81 MG EC tablet Take 81 mg by mouth daily.        . hydrochlorothiazide (HYDRODIURIL) 12.5 MG tablet Take 1 tablet (12.5 mg total) by mouth daily.  30 tablet  1  . levothyroxine  (SYNTHROID, LEVOTHROID) 200 MCG tablet Take 1 tablet (200 mcg total) by mouth daily before breakfast. = 225 mcg  30 tablet  1  . levothyroxine (SYNTHROID, LEVOTHROID) 25 MCG tablet Take 25 mcg by mouth daily before breakfast. =225 mcg      . potassium chloride (K-DUR) 10 MEQ tablet Take 1 tablet (10 mEq total) by mouth daily.  30 tablet  3   No facility-administered medications prior to visit.   Past Medical History  Diagnosis Date  . HTN (hypertension)   . Diabetes mellitus   . Hashimoto thyroiditis     w subsequent hypothyroidism  . Cerebrovascular accident   . Hyperlipidemia   . Anemia   . Arthritis   . Chronic kidney disease   . CAD (coronary artery disease)   . Sleep apnea   . Colitis     ? at 44  . Anemia, iron deficiency 11/07/2013  . 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 Abdominal hysterectomy    . Tonsillectomy    . Coronary artery bypass graft    . Total knee arthroplasty Left   .  Roux-en-y gastric bypass      loss over 100 pounds  . Cholecystectomy     History   Social History  . Marital Status: Single    Spouse Name: N/A    Number of Children: 0  . Years of Education: N/A   Occupational History  . customer service    Social History Main Topics  . Smoking status: Never Smoker   . Smokeless tobacco: Never Used  . Alcohol Use: No  . Drug Use: No  . Sexual Activity: None   Other Topics Concern  . None   Social History Narrative   Single, no children   Customer service   1 caffeine/day   Family History  Problem Relation Age of Onset  . Coronary artery disease Other   . Stroke Maternal Grandmother   . Diabetes Paternal Grandmother     diabetic coma  . Heart attack Father   . Heart attack Mother   . Heart attack Maternal Grandfather   . Heart attack Paternal Grandfather     Review of Systems + insomnia, fatigue, hearing difficulty All other ROS negative      Objective:   Physical Exam General:  Well-developed, well-nourished and in no acute distress Eyes:  anicteric. ENT:   Mouth and posterior pharynx free of lesions.  Neck:   supple w/o thyromegaly or mass.  Lungs: Clear to auscultation bilaterally. Heart:  S1S2, no rubs, murmurs, gallops. Abdomen:  soft, non-tender, no hepatosplenomegaly, hernia, or mass and BS+.  Rectal: deferred Lymph:  no cervical or supraclavicular adenopathy. Extremities:   no edema Skin   no rash. Neuro:  A&O x 3.  Psych:  appropriate mood and  Affect.   Data Reviewed:   Lab Results  Component Value Date   [id number] 08/21/2013   Lab Results  Component Value Date   FERRITIN 4* 08/21/2013        Assessment & Plan:  Anemia, iron deficiency Likely multi-factorial but suspect post-op malabsorption most likely. Needs colonoscopy and possible EGD given this and heme + stool The risks and benefits as well as alternatives of endoscopic procedure(s) have been discussed and reviewed. All questions answered. The patient agrees to proceed.   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 [id number] TOM, MD

## 2013-11-08 ENCOUNTER — Encounter: Payer: Self-pay | Admitting: Internal Medicine

## 2013-11-08 NOTE — Progress Notes (Signed)
Quick Note:  Hgb only slightly better Offer her IV iron infusion - can set up at short stay if desired - am thinking her ability to absorb the oral iron not adequate given bariatric surgey ______

## 2013-11-17 ENCOUNTER — Telehealth: Payer: Self-pay | Admitting: Internal Medicine

## 2013-11-17 NOTE — Telephone Encounter (Signed)
Left detailed message and will await patient's call back to let us know if she wants to do the IV iron infusion.

## 2013-11-22 NOTE — Telephone Encounter (Signed)
Patient informed of lab results and declines doing the iron infusion.  Has no insurance currently.  Will take her OTC Vitron C brand iron supplement one capsule twice a day.  The iron is 65 mg of elemental iron and 125 mg of Vitamin C.   In November she will get Medicare and call us back to r/s her colonoscopy.  This information will be routed to Dr. Carlean Purl to make him aware.

## 2013-12-20 ENCOUNTER — Other Ambulatory Visit: Payer: Self-pay | Admitting: Family Medicine

## 2014-01-02 ENCOUNTER — Encounter: Payer: No Typology Code available for payment source | Admitting: Internal Medicine

## 2014-01-30 ENCOUNTER — Telehealth: Payer: Self-pay | Admitting: Family Medicine

## 2014-01-30 NOTE — Telephone Encounter (Signed)
[facility] [place] - [address]   Pt is needing a refill on pravastatin (PRAVACHOL) 40 MG tablet (90 day supply) levothyroxine (SYNTHROID, LEVOTHROID) 100 MCG tablet (she was confusing me with the medication she is saying something about 1.75 mg) (90 day supply) She now has a yeast infection and needs something for that as well

## 2014-01-31 NOTE — Telephone Encounter (Signed)
LMTRC

## 2014-02-03 NOTE — Telephone Encounter (Signed)
Call placed to patient. LMTRC.  

## 2014-02-06 MED ORDER — FLUCONAZOLE 150 MG PO TABS: 150.0000 mg | ORAL_TABLET | Freq: Once | ORAL | Status: DC

## 2014-02-06 MED ORDER — LOVASTATIN 20 MG PO TABS: 40.0000 mg | ORAL_TABLET | Freq: Every day | ORAL | Status: DC

## 2014-02-06 NOTE — Telephone Encounter (Signed)
Call returned by patient.   Reports that she was given Levothyroxine 177mcg, but requested to have medication changed to 174mcg so that she would only need to take (1) pill.   Also reports that she was given ABTx for UTI, and now she has itching and thick white discharge. Requested Diflucan.   MD please advise.

## 2014-02-06 NOTE — Telephone Encounter (Signed)
Diflucan ok, do not agree with changing dose of levothyroxine.

## 2014-02-06 NOTE — Telephone Encounter (Signed)
Pt wanted to know if she could switch to Lovastatin instead of Pravastatin due to price. Per [facility] ok to switch but will need to do 2 tabs of the lovastatin 20mg . Pt aware and meds sent to pharm

## 2014-02-28 ENCOUNTER — Encounter: Payer: Self-pay | Admitting: Family Medicine

## 2014-02-28 ENCOUNTER — Other Ambulatory Visit: Payer: Self-pay | Admitting: Family Medicine

## 2014-02-28 NOTE — Telephone Encounter (Signed)
Medication refill for one time only.  Patient needs to be seen.  Letter sent for patient to call and schedule 

## 2014-03-29 ENCOUNTER — Other Ambulatory Visit: Payer: Self-pay | Admitting: Family Medicine

## 2014-03-29 NOTE — Telephone Encounter (Signed)
Prescription sent to pharmacy.

## 2014-05-10 ENCOUNTER — Telehealth: Payer: Self-pay | Admitting: Family Medicine

## 2014-05-10 ENCOUNTER — Other Ambulatory Visit: Payer: Self-pay | Admitting: Family Medicine

## 2014-05-10 MED ORDER — METRONIDAZOLE 500 MG PO TABS: 500.0000 mg | ORAL_TABLET | Freq: Two times a day (BID) | ORAL | Status: DC

## 2014-05-10 NOTE — Telephone Encounter (Signed)
Medication filled x1 with no refills.   Requires office visit before any further refills can be given.   Letter sent.  

## 2014-05-10 NOTE — Telephone Encounter (Signed)
Patient is calling to say that she has a bacterial vaginal infection with odor, would like to know if dr pickard can call something in for her at the [facility] on [address] if questions

## 2014-05-10 NOTE — Telephone Encounter (Signed)
Pt aware via vm and med sent to [facility]

## 2014-05-10 NOTE — Telephone Encounter (Signed)
Flagyl 500 bid for 7 days, ntbs if worse.

## 2014-05-10 NOTE — Telephone Encounter (Signed)
?    lov 09/04/13

## 2014-05-25 ENCOUNTER — Other Ambulatory Visit: Payer: Self-pay | Admitting: Family Medicine

## 2014-07-03 ENCOUNTER — Encounter: Payer: Self-pay | Admitting: Family Medicine

## 2014-07-10 ENCOUNTER — Other Ambulatory Visit: Payer: Self-pay | Admitting: Family Medicine

## 2014-07-11 NOTE — Telephone Encounter (Signed)
Refill denied.   Requires office visit before any further refills can be given.  

## 2014-07-16 ENCOUNTER — Other Ambulatory Visit: Payer: Self-pay | Admitting: Family Medicine

## 2014-07-16 DIAGNOSIS — E038 Other specified hypothyroidism: Secondary | ICD-10-CM

## 2014-07-16 MED ORDER — LEVOTHYROXINE SODIUM 88 MCG PO TABS: 88.0000 ug | ORAL_TABLET | Freq: Every day | ORAL | Status: DC

## 2014-07-16 NOTE — Telephone Encounter (Signed)
Med sent to pharm for refill and pt needs office visit and labs before further refills

## 2014-07-21 ENCOUNTER — Other Ambulatory Visit: Payer: Self-pay | Admitting: Family Medicine

## 2014-08-06 DIAGNOSIS — E119 Type 2 diabetes mellitus without complications: Secondary | ICD-10-CM | POA: Insufficient documentation

## 2014-08-06 DIAGNOSIS — E039 Hypothyroidism, unspecified: Secondary | ICD-10-CM | POA: Insufficient documentation

## 2014-08-09 ENCOUNTER — Encounter: Payer: Self-pay | Admitting: Family Medicine

## 2014-08-15 ENCOUNTER — Ambulatory Visit: Payer: Self-pay | Admitting: Internal Medicine

## 2014-08-24 ENCOUNTER — Other Ambulatory Visit: Payer: Self-pay | Admitting: Family Medicine

## 2014-08-28 ENCOUNTER — Other Ambulatory Visit: Payer: Self-pay | Admitting: Family Medicine

## 2014-09-13 ENCOUNTER — Encounter: Payer: Self-pay | Admitting: Family Medicine

## 2014-09-25 NOTE — Discharge Summary (Signed)
PATIENT NAME:  [name], [name] MR#:  [id number] DATE OF BIRTH:  Jan 10, 1951  DATE OF ADMISSION:  02/02/2012 DATE OF DISCHARGE:  02/05/2012  ADMITTING DIAGNOSIS: Severe left knee osteoarthritis.   DISCHARGE DIAGNOSIS: Severe left knee osteoarthritis.   PROCEDURE: Left total knee replacement.   ANESTHESIA: Spinal.   SURGEON: Laurene Footman, M.D.   ESTIMATED BLOOD LOSS: 50 mL.   COMPLICATIONS: None.   SPECIMEN: Cut ends of bone.   IMPLANTS: Medacta GMK primary resurfacing patella, size 2 GMK primary femoral component, size 4n left standard, GMK primary tibial tray size 4 left fixed and a size 4 14 mm UC tibial insert.   CONDITION: To recovery room stable.   HISTORY: Patient is a 63 year old who has had exhausted nonoperative treatment. 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 She has had to leave work this past week because of severe pain and she feels like she cannot work right now because of this. She has been having significant pain sometime. Had corticosteroid injection and exercise program without relief. She has had prior x-rays that showed significant degenerative osteoarthritis with marked spurring and marked patellofemoral arthritis. Her MyKnee CT was reviewed with her. Surgical plan, risks, benefits, and possible complications discussed.   PHYSICAL EXAMINATION: EXTREMITY: On exam she has 0 to 110 degrees range of motion, varus deformity that is passively correctable. She has swelling to the knee. Skin is intact. She has some diminished sensation from neuropathy in the lower extremity. She has trace dorsalis pedis pulse. Skin is intact. LUNGS: Clear to auscultation. HEART: Regular rate and rhythm. HEENT is normal.   HOSPITAL COURSE: Patient was admitted to the hospital on 02/02/2012. She had surgery that same day and was brought to the orthopedic floor from the PAC-U in stable condition. On postoperative day one hemoglobin was found to be low at 7.3 so she was given  1 unit and next day was up to 8.4. Patient's vital signs remained very stable and she progressed very well throughout physical therapy. On 02/05/2012 patient's hemoglobin was checked again, was stable and patient had progressed well with physical therapy and she was ready for discharge to home with home health.   DISCHARGE INSTRUCTIONS:  1. Patient may gradually increase weight-bearing on the affected extremity.  2. She needs to elevate the affected foot on two pillows with the foot higher than the knee. Knee-high TED hose on both legs and remove at that time, replace when arising the next morning.  3. Diet: May resume a regular diet as tolerated.  4. Xarelto 10 mg every morning for nine days. Tylenol 650 to 1000 mg every six hours as needed for pain, oxycodone 5 to 10 mg every four hours as needed for pain.  5. Wound care: Apply ice pack to the affected area. Continue using Polar Care unit maintaining temperature between 40 and 50 degrees. Do not get the dressing bandage wet or dirty. Call [facility] orthopedics if the dressing gets water under it. Leave dressing on.  6. Symptoms to report: Call [facility] orthopedics if any of the following occur: Bright red bleeding from the incision wound, fever above 101.5 degrees, redness, swelling or draining at the incision. Call [facility] orthopedics if experience increased leg pain, numbness or weakness in your legs or bowel or bladder symptoms.   REFERRALS: Home physical therapy has been arranged. Call [facility] orthopedics if a therapist has not contacted you within 48 hours.   FOLLOW UP: Follow-up appointment  02/16/2012 10:15 a.m.   DISCHARGE MEDICATIONS:  1. Hydrochlorothiazide 25 mg 1 tablet orally once a day in the morning. 2. Benazepril 40 mg 1 tablet orally once a day in the morning.  3. Crestor 10 mg oral tablet 1 tablet orally once a day in the morning.  4. Metoprolol tartrate 25 mg oral tablet 1 tablet orally 2 times a  day. 5. Potassium chloride ER 20 mEq 1 tablet oral once a day in the morning.  6. Levothyroxine 50 mcg 1 tablet orally once a day in the morning. 7. Probiotic 1 tablet orally once a day in the morning.  8. Vitamin E 400 international units 1 tablet oral in the morning. 9. Levothyroxine 200 mcg oral tablet 1 tablet orally once a day in the morning. 10. Co-Q-10 100 mg 1 tablet orally in the morning. 11. Fish oil 300 mg 1 tablet orally once in the morning.  12. Multivitamin 2 to 3 tabs orally daily. 13. Hydrocodone acetaminophen 5/500, 1 to 2 tabs every six hours p.r.n. pain.  ____________________________ Duanne Guess, PA-C tcg:cms D: 02/05/2012 07:33:11 ET T: 02/05/2012 13:40:48 ET JOB#: [id number]  cc: Duanne Guess, PA-C, <Dictator>  Duanne Guess PA ELECTRONICALLY SIGNED 02/15/2012 14:15

## 2014-09-25 NOTE — Op Note (Signed)
PATIENT NAME:  [name], [name] MR#:  [id number] DATE OF BIRTH:  08/03/1950  DATE OF PROCEDURE:  02/02/2012  PREOPERATIVE DIAGNOSIS: Severe left knee osteoarthritis.   POSTOPERATIVE DIAGNOSIS: Severe left knee osteoarthritis.   PROCEDURE: Left total knee replacement.   ANESTHESIA: Spinal.   SURGEON: Laurene Footman.   DESCRIPTION OF PROCEDURE: The patient was brought to the Operating Room, and after adequate spinal anesthesia was obtained the left leg was prepped and draped in the usual sterile fashion with a tourniquet applied to the upper thigh and Alvarado legholder utilized. After patient identification and time out procedures were completed, the leg was exsanguinated with an Esmarch and tourniquet raised to 300 mmHg.  A midline skin incision was made, followed by a medial parapatellar arthrotomy. Inspection of the knee revealed complete loss of cartilage with significant bone erosion on the medial compartment and patellofemoral joint. The lateral compartment was relatively normal. The anterior cruciate ligament was excised along with the fat pad. After exposing the proximal tibia, the proximal tibial cutting block was applied from the MyKnee System, alignment checked, and proximal tibia cut carried out with excision of the anterior horns of the medial and lateral menisci. Next, the femur was approached in a similar fashion. Because of the significant flexion contracture, an additional 2 mm of distal femur were removed. A 4 cutting block was applied and anterior and posterior chamfer cuts made. The residual posterior aspect of the tibia was removed at this time with spurs being identified and excised along with the posterior horns of the medial and lateral menisci and release of the posterior cruciate ligament. The proximal tibia sized to a size 4; this was applied and pins placed, a proximal drill hole made, and then the notch inserted. A 12 trial was inserted followed by the 4 femur. There was some  laxity in flexion, so with a 14 trial there was good stability in flexion, and full extension was obtained, and this was chosen for the subsequent implant. The femur was then drilled and a notch cut made for the trochlear groove. The patella was then cut using the guide and sized to a size 2 after drill holes were made using the guide. The patella tracked very well with the no touch technique with the trial components in place. At this point, all components were removed and the knee was thoroughly irrigated and dried; 30 mL of 0.25% Sensorcaine with epinephrine was infiltrated into the posterior capsule as well as along the medial arthrotomy. The tibial component was cemented in place first, followed by the polyethylene insert with excess cement removed, followed by the femoral component. The knee was held in extension and the patellar button was clamped into place with all components being cemented with Simplex P bone cement. After the cement was set, any excess cement was removed. The knee was thoroughly irrigated and the tourniquet let down, and there was no excessive bleeding. The arthrotomy was repaired using a heavy quill suture followed by 2-0 quill subcutaneously and skin staples. Xeroform, 4 x 4's, ABD, Webril, Ace wrap and Polar Care were applied along with a knee immobilizer. The patient was sent to the recovery room in stable condition.   ESTIMATED BLOOD LOSS: 50 mL.   COMPLICATIONS: None.   SPECIMEN: Cut ends of bone.   IMPLANTS: Medacta GMK primary resurfacing patella size 2, GMK primary femoral component size 4N left standard, a GMK primary tibial tray size 4 left fixed, and a size 4, 14 mm UC tibial insert.  CONDITION: TO RECOVERY ROOM: Stable.   TOURNIQUET TIME: 62 minutes at 300 mmHg.  ____________________________ Laurene Footman, MD mjm:cbb D: 02/02/2012 18:58:51 ET T: 02/03/2012 10:08:37 ET JOB#: [id number]  cc: Laurene Footman, MD, <Dictator> Laurene Footman MD ELECTRONICALLY  SIGNED 02/03/2012 12:03

## 2014-10-16 DIAGNOSIS — N184 Chronic kidney disease, stage 4 (severe): Secondary | ICD-10-CM | POA: Insufficient documentation

## 2014-10-16 DIAGNOSIS — N183 Chronic kidney disease, stage 3 unspecified: Secondary | ICD-10-CM | POA: Insufficient documentation

## 2014-10-19 ENCOUNTER — Other Ambulatory Visit: Payer: Self-pay | Admitting: Internal Medicine

## 2014-10-19 DIAGNOSIS — R748 Abnormal levels of other serum enzymes: Secondary | ICD-10-CM

## 2014-10-24 ENCOUNTER — Ambulatory Visit: Admission: RE | Admit: 2014-10-24 | Payer: Medicare Other | Source: Ambulatory Visit

## 2014-11-13 ENCOUNTER — Ambulatory Visit: Admission: RE | Admit: 2014-11-13 | Payer: Medicare Other | Source: Ambulatory Visit

## 2014-12-24 ENCOUNTER — Encounter: Payer: Self-pay | Admitting: *Deleted

## 2014-12-24 ENCOUNTER — Ambulatory Visit: Payer: Medicare Other | Admitting: Anesthesiology

## 2014-12-24 ENCOUNTER — Encounter
Admission: RE | Disposition: A | Discharge status: Home / Self Care | Payer: Self-pay | Source: Ambulatory Visit | Attending: Gastroenterology

## 2014-12-24 ENCOUNTER — Ambulatory Visit
Admission: RE | Admit: 2014-12-24 | Discharge: 2014-12-24 | Disposition: A | Discharge status: Home / Self Care | Payer: Medicare Other | Source: Ambulatory Visit | Attending: Gastroenterology | Admitting: Gastroenterology

## 2014-12-24 DIAGNOSIS — Z79899 Other long term (current) drug therapy: Secondary | ICD-10-CM | POA: Insufficient documentation

## 2014-12-24 DIAGNOSIS — Z885 Allergy status to narcotic agent status: Secondary | ICD-10-CM | POA: Diagnosis not present

## 2014-12-24 DIAGNOSIS — N189 Chronic kidney disease, unspecified: Secondary | ICD-10-CM | POA: Diagnosis not present

## 2014-12-24 DIAGNOSIS — E785 Hyperlipidemia, unspecified: Secondary | ICD-10-CM | POA: Insufficient documentation

## 2014-12-24 DIAGNOSIS — D509 Iron deficiency anemia, unspecified: Secondary | ICD-10-CM | POA: Diagnosis not present

## 2014-12-24 DIAGNOSIS — E1122 Type 2 diabetes mellitus with diabetic chronic kidney disease: Secondary | ICD-10-CM | POA: Diagnosis not present

## 2014-12-24 DIAGNOSIS — Z9049 Acquired absence of other specified parts of digestive tract: Secondary | ICD-10-CM | POA: Diagnosis not present

## 2014-12-24 DIAGNOSIS — Z8673 Personal history of transient ischemic attack (TIA), and cerebral infarction without residual deficits: Secondary | ICD-10-CM | POA: Diagnosis not present

## 2014-12-24 DIAGNOSIS — I2510 Atherosclerotic heart disease of native coronary artery without angina pectoris: Secondary | ICD-10-CM | POA: Insufficient documentation

## 2014-12-24 DIAGNOSIS — Z888 Allergy status to other drugs, medicaments and biological substances status: Secondary | ICD-10-CM | POA: Diagnosis not present

## 2014-12-24 DIAGNOSIS — R51 Headache: Secondary | ICD-10-CM | POA: Diagnosis not present

## 2014-12-24 DIAGNOSIS — Z7982 Long term (current) use of aspirin: Secondary | ICD-10-CM | POA: Diagnosis not present

## 2014-12-24 DIAGNOSIS — I129 Hypertensive chronic kidney disease with stage 1 through stage 4 chronic kidney disease, or unspecified chronic kidney disease: Secondary | ICD-10-CM | POA: Diagnosis not present

## 2014-12-24 DIAGNOSIS — M1990 Unspecified osteoarthritis, unspecified site: Secondary | ICD-10-CM | POA: Diagnosis not present

## 2014-12-24 DIAGNOSIS — K210 Gastro-esophageal reflux disease with esophagitis: Secondary | ICD-10-CM | POA: Diagnosis not present

## 2014-12-24 DIAGNOSIS — Z9884 Bariatric surgery status: Secondary | ICD-10-CM | POA: Diagnosis not present

## 2014-12-24 DIAGNOSIS — E063 Autoimmune thyroiditis: Secondary | ICD-10-CM | POA: Diagnosis not present

## 2014-12-24 DIAGNOSIS — E039 Hypothyroidism, unspecified: Secondary | ICD-10-CM | POA: Insufficient documentation

## 2014-12-24 DIAGNOSIS — I252 Old myocardial infarction: Secondary | ICD-10-CM | POA: Insufficient documentation

## 2014-12-24 DIAGNOSIS — K5730 Diverticulosis of large intestine without perforation or abscess without bleeding: Secondary | ICD-10-CM | POA: Diagnosis not present

## 2014-12-24 DIAGNOSIS — Z90710 Acquired absence of both cervix and uterus: Secondary | ICD-10-CM | POA: Insufficient documentation

## 2014-12-24 DIAGNOSIS — R195 Other fecal abnormalities: Secondary | ICD-10-CM | POA: Insufficient documentation

## 2014-12-24 DIAGNOSIS — Z96652 Presence of left artificial knee joint: Secondary | ICD-10-CM | POA: Diagnosis not present

## 2014-12-24 DIAGNOSIS — D519 Vitamin B12 deficiency anemia, unspecified: Secondary | ICD-10-CM | POA: Insufficient documentation

## 2014-12-24 DIAGNOSIS — E538 Deficiency of other specified B group vitamins: Secondary | ICD-10-CM | POA: Diagnosis present

## 2014-12-24 DIAGNOSIS — G4730 Sleep apnea, unspecified: Secondary | ICD-10-CM | POA: Insufficient documentation

## 2014-12-24 HISTORY — PX: ESOPHAGOGASTRODUODENOSCOPY (EGD) WITH PROPOFOL: SHX5813

## 2014-12-24 HISTORY — DX: Headache: R51

## 2014-12-24 HISTORY — DX: Headache, unspecified: R51.9

## 2014-12-24 HISTORY — DX: Varicella without complication: B01.9

## 2014-12-24 HISTORY — DX: Hypothyroidism, unspecified: E03.9

## 2014-12-24 HISTORY — DX: Acute myocardial infarction, unspecified: I21.9

## 2014-12-24 HISTORY — PX: COLONOSCOPY WITH PROPOFOL: SHX5780

## 2014-12-24 LAB — GLUCOSE, CAPILLARY: GLUCOSE-CAPILLARY: 132 mg/dL — AB (ref 65–99)

## 2014-12-24 SURGERY — COLONOSCOPY WITH PROPOFOL
Anesthesia: General

## 2014-12-24 MED ORDER — SODIUM CHLORIDE 0.9 % IV SOLN
INTRAVENOUS | Status: DC
  Administered 2014-12-24: 1000 mL via INTRAVENOUS
  Administered 2014-12-24: 09:00:00 via INTRAVENOUS

## 2014-12-24 MED ORDER — SODIUM CHLORIDE 0.9 % IV SOLN: INTRAVENOUS | Status: DC

## 2014-12-24 MED ORDER — PROPOFOL INFUSION 10 MG/ML OPTIME
INTRAVENOUS | Status: DC | PRN
  Administered 2014-12-24: 75 ug/kg/min via INTRAVENOUS

## 2014-12-24 MED ORDER — FENTANYL CITRATE (PF) 100 MCG/2ML IJ SOLN
INTRAMUSCULAR | Status: DC | PRN
  Administered 2014-12-24: 50 ug via INTRAVENOUS

## 2014-12-24 MED ORDER — PROPOFOL 10 MG/ML IV BOLUS
INTRAVENOUS | Status: DC | PRN
  Administered 2014-12-24: 50 mg via INTRAVENOUS

## 2014-12-24 NOTE — Anesthesia Postprocedure Evaluation (Signed)
  Anesthesia Post-op Note  Patient: [name]  Procedure(s) Performed: Procedure(s): COLONOSCOPY WITH PROPOFOL (N/A) ESOPHAGOGASTRODUODENOSCOPY (EGD) WITH PROPOFOL (N/A)  Anesthesia type:General  Patient location: PACU  Post pain: Pain level controlled  Post assessment: Post-op Vital signs reviewed, Patient's Cardiovascular Status Stable, Respiratory Function Stable, Patent Airway and No signs of Nausea or vomiting  Post vital signs: Reviewed and stable  Last Vitals:  Filed Vitals:   12/24/14 1010  BP: 150/64  Pulse: 63  Temp:   Resp: 21    Level of consciousness: awake, alert  and patient cooperative  Complications: No apparent anesthesia complications

## 2014-12-24 NOTE — Op Note (Signed)
[facility] Patient Name: [name] Procedure Date: 12/24/2014 9:18 AM MRN: [id number] Account #: [id number] Date of Birth: 1951/04/17 Admit Type: Outpatient Age: 64 Room: [facility] ENDO ROOM 4 Gender: Female Note Status: Finalized Procedure:         Colonoscopy Indications:       Heme positive stool, Iron deficiency anemia, Vitamin B12                     deficiency anemia Providers:         [name]. Candace Cruise, MD Referring MD:      Glendon Axe (Referring MD) Medicines:         Monitored Anesthesia Care Complications:     No immediate complications. Procedure:         Pre-Anesthesia Assessment:                    - Prior to the procedure, a History and Physical was                     performed, and patient medications, allergies and                     sensitivities were reviewed. The patient's tolerance of                     previous anesthesia was reviewed.                    - The risks and benefits of the procedure and the sedation                     options and risks were discussed with the patient. All                     questions were answered and informed consent was obtained.                    - After reviewing the risks and benefits, the patient was                     deemed in satisfactory condition to undergo the procedure.                    After obtaining informed consent, the colonoscope was                     passed under direct vision. Throughout the procedure, the                     patient's blood pressure, pulse, and oxygen saturations                     were monitored continuously. The Colonoscope was                     introduced through the anus and advanced to the the cecum,                     identified by appendiceal orifice and ileocecal valve. The                     colonoscopy was performed without difficulty. The patient  tolerated the procedure well. The quality of the bowel      preparation was fair. Findings:      Multiple small-mouthed diverticula were found in the sigmoid colon and       in the descending colon.      The exam was otherwise without abnormality. Impression:        - Diverticulosis in the sigmoid colon and in the                     descending colon.                    - The examination was otherwise normal.                    - No specimens collected. Recommendation:    - Discharge patient to home.                    - Repeat colonoscopy in 10 years for surveillance.                    - The findings and recommendations were discussed with the                     patient. Procedure Code(s): --- Professional ---                    [phone], Colonoscopy, flexible; diagnostic, including                     collection of specimen(s) by brushing or washing, when                     performed (separate procedure) Diagnosis Code(s): --- Professional ---                    R19.5, Other fecal abnormalities                    D50.9, Iron deficiency anemia, unspecified                    D51.9, Vitamin B12 deficiency anemia, unspecified                    K57.30, Diverticulosis of large intestine without                     perforation or abscess without bleeding CPT copyright 2014 American Medical Association. All rights reserved. The codes documented in this report are preliminary and upon coder review may  be revised to meet current compliance requirements. Hulen Luster, MD 12/24/2014 9:58:55 AM This report has been signed electronically. Number of Addenda: 0 Note Initiated On: 12/24/2014 9:18 AM Scope Withdrawal Time: 0 hours 7 minutes 56 seconds  Total Procedure Duration: 0 hours 12 minutes 17 seconds       [facility]

## 2014-12-24 NOTE — Transfer of Care (Signed)
Immediate Anesthesia Transfer of Care Note  Patient: [name]  Procedure(s) Performed: Procedure(s): COLONOSCOPY WITH PROPOFOL (N/A) ESOPHAGOGASTRODUODENOSCOPY (EGD) WITH PROPOFOL (N/A)  Patient Location: PACU  Anesthesia Type:General  Level of Consciousness: awake  Airway & Oxygen Therapy: Patient Spontanous Breathing  Post-op Assessment: Report given to RN  Post vital signs: stable  Last Vitals:  Filed Vitals:   12/24/14 0824  BP: 182/73  Pulse: 63  Temp: 36.4 C  Resp: 20    Complications: No apparent anesthesia complications

## 2014-12-24 NOTE — Op Note (Signed)
[facility]astroenterology Patient Name: [name] Procedure Date: 12/24/2014 9:24 AM MRN: [id number] Account #: [id number] Date of Birth: 27-Feb-1951 Admit Type: Outpatient Age: 64 Room: [facility] ENDO ROOM 4 Gender: Female Note Status: Finalized Procedure:         Upper GI endoscopy Indications:       Iron deficiency anemia, Vitamin B12 deficiency anemia,                     Heme positive stool Providers:         [name]. Candace Cruise, MD Referring MD:      Glendon Axe (Referring MD) Medicines:         Monitored Anesthesia Care Complications:     No immediate complications. Procedure:         Pre-Anesthesia Assessment:                    - Prior to the procedure, a History and Physical was                     performed, and patient medications, allergies and                     sensitivities were reviewed. The patient's tolerance of                     previous anesthesia was reviewed.                    - The risks and benefits of the procedure and the sedation                     options and risks were discussed with the patient. All                     questions were answered and informed consent was obtained.                    - After reviewing the risks and benefits, the patient was                     deemed in satisfactory condition to undergo the procedure.                    After obtaining informed consent, the endoscope was passed                     under direct vision. Throughout the procedure, the                     patient's blood pressure, pulse, and oxygen saturations                     were monitored continuously. The Olympus GIF-160 endoscope                     (S#. [id number]) was introduced through the mouth, and                     advanced to the second part of duodenum. The upper GI                     endoscopy was accomplished without difficulty. The patient  tolerated the procedure well. Findings:      Posible esophagitis  was found at the gastroesophageal junction. Biopsies       were taken with a cold forceps for histology.      The exam of the stomach was otherwise normal.      S/P gastric bypass.      The examined duodenum was normal. Impression:        - Esophagitis. Biopsied.                    - Normal examined duodenum. Recommendation:    - Discharge patient to home.                    - Await pathology results.                    - Observe patient's clinical course.                    - The findings and recommendations were discussed with the                     patient. Procedure Code(s): --- Professional ---                    [id number], Esophagogastroduodenoscopy, flexible, transoral;                     with biopsy, single or multiple Diagnosis Code(s): --- Professional ---                    K20.9, Esophagitis, unspecified                    D50.9, Iron deficiency anemia, unspecified                    D51.9, Vitamin B12 deficiency anemia, unspecified                    R19.5, Other fecal abnormalities CPT copyright 2014 American Medical Association. All rights reserved. The codes documented in this report are preliminary and upon coder review may  be revised to meet current compliance requirements. Hulen Luster, MD 12/24/2014 9:43:10 AM This report has been signed electronically. Number of Addenda: 0 Note Initiated On: 12/24/2014 9:24 AM      [facility]

## 2014-12-24 NOTE — H&P (Signed)
Primary Care Physician:  Glendon Axe, MD Primary Gastroenterologist:  Dr. Candace Cruise  Pre-Procedure History & Physical: HPI:  [name] is a 64 y.o. female is here for an EGD/colonoscopy.   Past Medical History  Diagnosis Date  . HTN (hypertension)   . Diabetes mellitus   . Hashimoto thyroiditis     w subsequent hypothyroidism  . Cerebrovascular accident   . Hyperlipidemia   . Anemia   . Arthritis   . Chronic kidney disease   . CAD (coronary artery disease)   . Sleep apnea   . Colitis     ? at 8  . Anemia, iron deficiency 11/07/2013  . 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 Headache   . Chicken pox   . Hypothyroidism   . Myocardial infarction     Past Surgical History  Procedure Laterality Date  . Abdominal hysterectomy    . Tonsillectomy    . Coronary artery bypass graft    . Total knee arthroplasty Left   . Roux-en-y gastric bypass      loss over 100 pounds  . Cholecystectomy    . Joint replacement      Prior to Admission medications   Medication Sig Start Date End Date Taking? Authorizing Provider  aspirin EC 81 MG tablet Take 81 mg by mouth daily.   Yes Historical Provider, MD  furosemide (LASIX) 40 MG tablet Take 40 mg by mouth.   Yes Historical Provider, MD  potassium chloride (K-DUR) 10 MEQ tablet Take 10 mEq by mouth daily.   Yes Historical Provider, MD  amLODipine (NORVASC) 10 MG tablet Take 1 tablet (10 mg total) by mouth daily. 08/21/13   Susy Frizzle, MD  benazepril (LOTENSIN) 40 MG tablet TAKE ONE TABLET BY MOUTH ONCE DAILY 08/24/14   Susy Frizzle, MD  Cholecalciferol (VITAMIN D3) 1000 UNITS CAPS Take by mouth.    Historical Provider, MD  cloNIDine (CATAPRES) 0.1 MG tablet TAKE ONE TABLET BY MOUTH TWICE DAILY (PATIENT  NEEDS  OFFICE  VISIT  BEFORE  ANY  FURTHER  REFILLS  CAN  BE  GIVEN) 07/24/14   Susy Frizzle, MD  Coenzyme Q10 (COQ10) 100 MG CAPS Take by mouth.    Historical Provider, MD  fluconazole  (DIFLUCAN) 150 MG tablet Take 1 tablet (150 mg total) by mouth once. 02/06/14   Susy Frizzle, MD  hydrALAZINE (APRESOLINE) 25 MG tablet Take 1 tablet (25 mg total) by mouth 3 (three) times daily. 08/09/13   Susy Frizzle, MD  hydrochlorothiazide (MICROZIDE) 12.5 MG capsule TAKE ONE CAPSULE BY MOUTH ONCE DAILY 05/28/14   Susy Frizzle, MD  levothyroxine (SYNTHROID, LEVOTHROID) 100 MCG tablet Take 1 tablet (100 mcg total) by mouth daily. 08/21/13   Susy Frizzle, MD  levothyroxine (SYNTHROID, LEVOTHROID) 88 MCG tablet Take 1 tablet (88 mcg total) by mouth daily. 07/16/14   Susy Frizzle, MD  lovastatin (MEVACOR) 20 MG tablet Take 2 tablets (40 mg total) by mouth at bedtime. 02/06/14   Susy Frizzle, MD  metroNIDAZOLE (FLAGYL) 500 MG tablet Take 1 tablet (500 mg total) by mouth 2 (two) times daily. 05/10/14   Susy Frizzle, MD  Multiple Vitamins-Minerals (MULTIVITAMIN WITH MINERALS) tablet Take 1 tablet by mouth daily.    Historical Provider, MD  Omega-3 Fatty Acids (FISH OIL) 1200 MG CAPS Take by mouth daily.    Historical Provider, MD  vitamin B-12 (CYANOCOBALAMIN) 1000 MCG tablet Take 1,000 mcg by  mouth daily.    Historical Provider, MD    Allergies as of 11/16/2014 - Review Complete 11/07/2013  Allergen Reaction Noted  . Cardura [doxazosin mesylate] Other (See Comments) 08/31/2013  . Morphine and related Nausea And Vomiting 01/25/2013    Family History  Problem Relation Age of Onset  . Coronary artery disease Other   . Stroke Maternal Grandmother   . Diabetes Paternal Grandmother     diabetic coma  . Heart attack Father   . Heart attack Mother   . Heart attack Maternal Grandfather   . Heart attack Paternal Grandfather     History   Social History  . Marital Status: Single    Spouse Name: N/A  . Number of Children: 0  . Years of Education: N/A   Occupational History  . customer service    Social History Main Topics  . Smoking status: Never Smoker   . Smokeless  tobacco: Never Used  . Alcohol Use: No  . Drug Use: No  . Sexual Activity: Not on file   Other Topics Concern  . Not on file   Social History Narrative   Single, no children   Customer service   1 caffeine/day    Review of Systems: See HPI, otherwise negative ROS  Physical Exam: BP 182/73 mmHg  Pulse 63  Temp(Src) 97.6 F (36.4 C) (Tympanic)  Resp 20  Ht 5\' 6"  (1.676 m)  Wt 106.595 kg (235 lb)  BMI 37.95 kg/m2  SpO2 100% General:   Alert,  pleasant and cooperative in NAD Head:  Normocephalic and atraumatic. Neck:  Supple; no masses or thyromegaly. Lungs:  Clear throughout to auscultation.    Heart:  Regular rate and rhythm. Abdomen:  Soft, nontender and nondistended. Normal bowel sounds, without guarding, and without rebound.   Neurologic:  Alert and  oriented x4;  grossly normal neurologically.  Impression/Plan: [name] is here for an EGD/colonoscopy to be performed for heme positive stool/Fe deficiency/B12 deficiency.  Risks, benefits, limitations, and alternatives regarding  EGD/colonoscopy have been reviewed with the patient.  Questions have been answered.  All parties agreeable.   [name], [name], MD  12/24/2014, 8:46 AM

## 2014-12-24 NOTE — Anesthesia Preprocedure Evaluation (Addendum)
Anesthesia Evaluation  Patient identified by MRN, date of birth, ID band Patient awake    Reviewed: Allergy & Precautions, H&P , NPO status , Patient's Chart, lab work & pertinent test results, reviewed documented beta blocker date and time   Airway Mallampati: II  TM Distance: >3 FB Neck ROM: full    Dental  (+) Missing, Chipped, Poor Dentition   Pulmonary sleep apnea ,  breath sounds clear to auscultation  Pulmonary exam normal       Cardiovascular Exercise Tolerance: Good hypertension, + CAD and + Past MI Normal cardiovascular examRhythm:regular Rate:Normal     Neuro/Psych  Headaches, CVA negative psych ROS   GI/Hepatic negative GI ROS, Neg liver ROS,   Endo/Other  diabetesHypothyroidism   Renal/GU Renal InsufficiencyRenal disease  negative genitourinary   Musculoskeletal  (+) Arthritis -,   Abdominal   Peds  Hematology negative hematology ROS (+)   Anesthesia Other Findings Past Medical History:   HTN (hypertension)                                           Diabetes mellitus                                            Hashimoto thyroiditis                                          Comment:w subsequent hypothyroidism   Cerebrovascular accident                                     Hyperlipidemia                                               Anemia                                                       Arthritis                                                    Chronic kidney disease                                       CAD (coronary artery disease)                                Sleep apnea  Colitis                                                        Comment:? at 14   Anemia, iron deficiency                         11/07/2013     Postoperative malabsorption - s/p gastric bypa* 11/07/2013     Headache                                                     Chicken pox                                                   Hypothyroidism                                               Myocardial infarction                                        Reproductive/Obstetrics negative OB ROS                           Anesthesia Physical Anesthesia Plan  ASA: III  Anesthesia Plan: General   Post-op Pain Management:    Induction:   Airway Management Planned:   Additional Equipment:   Intra-op Plan:   Post-operative Plan:   Informed Consent: I have reviewed the patients History and Physical, chart, labs and discussed the procedure including the risks, benefits and alternatives for the proposed anesthesia with the patient or authorized representative who has indicated his/her understanding and acceptance.   Dental Advisory Given  Plan Discussed with: Anesthesiologist, CRNA and Surgeon  Anesthesia Plan Comments:         Anesthesia Quick Evaluation

## 2014-12-25 LAB — SURGICAL PATHOLOGY

## 2014-12-31 ENCOUNTER — Encounter: Payer: Self-pay | Admitting: Gastroenterology

## 2015-05-16 ENCOUNTER — Other Ambulatory Visit: Payer: Self-pay | Admitting: Internal Medicine

## 2015-05-17 ENCOUNTER — Other Ambulatory Visit: Payer: Self-pay | Admitting: Internal Medicine

## 2015-05-17 DIAGNOSIS — R519 Headache, unspecified: Secondary | ICD-10-CM

## 2015-05-17 DIAGNOSIS — R2689 Other abnormalities of gait and mobility: Secondary | ICD-10-CM

## 2015-05-17 DIAGNOSIS — R51 Headache: Principal | ICD-10-CM

## 2015-05-20 ENCOUNTER — Ambulatory Visit (HOSPITAL_COMMUNITY)
Admission: RE | Admit: 2015-05-20 | Discharge: 2015-05-20 | Disposition: A | Discharge status: Home / Self Care | Payer: Medicare Other | Source: Ambulatory Visit | Attending: Internal Medicine | Admitting: Internal Medicine

## 2015-05-20 DIAGNOSIS — R51 Headache: Secondary | ICD-10-CM | POA: Diagnosis present

## 2015-05-20 DIAGNOSIS — R2689 Other abnormalities of gait and mobility: Secondary | ICD-10-CM | POA: Insufficient documentation

## 2015-05-20 DIAGNOSIS — R519 Headache, unspecified: Secondary | ICD-10-CM

## 2015-05-21 ENCOUNTER — Ambulatory Visit: Payer: Medicare Other

## 2015-05-21 DIAGNOSIS — I2510 Atherosclerotic heart disease of native coronary artery without angina pectoris: Secondary | ICD-10-CM | POA: Insufficient documentation

## 2015-05-21 DIAGNOSIS — I252 Old myocardial infarction: Secondary | ICD-10-CM

## 2015-05-21 HISTORY — DX: Atherosclerotic heart disease of native coronary artery without angina pectoris: I25.10

## 2016-01-12 DIAGNOSIS — R7989 Other specified abnormal findings of blood chemistry: Secondary | ICD-10-CM | POA: Insufficient documentation

## 2016-01-28 ENCOUNTER — Other Ambulatory Visit: Payer: Self-pay | Admitting: Internal Medicine

## 2016-01-28 DIAGNOSIS — Z1239 Encounter for other screening for malignant neoplasm of breast: Secondary | ICD-10-CM

## 2016-02-19 ENCOUNTER — Ambulatory Visit
Admission: RE | Admit: 2016-02-19 | Discharge: 2016-02-19 | Disposition: A | Discharge status: Home / Self Care | Payer: Medicare Other | Source: Ambulatory Visit | Attending: Internal Medicine | Admitting: Internal Medicine

## 2016-02-19 ENCOUNTER — Other Ambulatory Visit: Payer: Self-pay | Admitting: Internal Medicine

## 2016-02-19 DIAGNOSIS — Z1239 Encounter for other screening for malignant neoplasm of breast: Secondary | ICD-10-CM

## 2016-02-19 DIAGNOSIS — Z1231 Encounter for screening mammogram for malignant neoplasm of breast: Secondary | ICD-10-CM | POA: Diagnosis not present

## 2016-08-05 DIAGNOSIS — Z780 Asymptomatic menopausal state: Secondary | ICD-10-CM | POA: Insufficient documentation

## 2017-01-26 DIAGNOSIS — F5104 Psychophysiologic insomnia: Secondary | ICD-10-CM | POA: Insufficient documentation

## 2017-09-01 ENCOUNTER — Other Ambulatory Visit: Payer: Self-pay

## 2017-09-01 ENCOUNTER — Observation Stay
Admission: EM | Admit: 2017-09-01 | Discharge: 2017-09-03 | Disposition: A | Discharge status: Home / Self Care | Payer: Medicare Other | Attending: Internal Medicine | Admitting: Internal Medicine

## 2017-09-01 DIAGNOSIS — I2510 Atherosclerotic heart disease of native coronary artery without angina pectoris: Secondary | ICD-10-CM | POA: Diagnosis not present

## 2017-09-01 DIAGNOSIS — K219 Gastro-esophageal reflux disease without esophagitis: Secondary | ICD-10-CM | POA: Insufficient documentation

## 2017-09-01 DIAGNOSIS — E1151 Type 2 diabetes mellitus with diabetic peripheral angiopathy without gangrene: Secondary | ICD-10-CM | POA: Diagnosis not present

## 2017-09-01 DIAGNOSIS — Z96652 Presence of left artificial knee joint: Secondary | ICD-10-CM | POA: Diagnosis not present

## 2017-09-01 DIAGNOSIS — E039 Hypothyroidism, unspecified: Secondary | ICD-10-CM | POA: Diagnosis not present

## 2017-09-01 DIAGNOSIS — N189 Chronic kidney disease, unspecified: Secondary | ICD-10-CM | POA: Insufficient documentation

## 2017-09-01 DIAGNOSIS — Z8673 Personal history of transient ischemic attack (TIA), and cerebral infarction without residual deficits: Secondary | ICD-10-CM | POA: Diagnosis not present

## 2017-09-01 DIAGNOSIS — K912 Postsurgical malabsorption, not elsewhere classified: Secondary | ICD-10-CM | POA: Diagnosis not present

## 2017-09-01 DIAGNOSIS — I129 Hypertensive chronic kidney disease with stage 1 through stage 4 chronic kidney disease, or unspecified chronic kidney disease: Secondary | ICD-10-CM | POA: Insufficient documentation

## 2017-09-01 DIAGNOSIS — R0609 Other forms of dyspnea: Secondary | ICD-10-CM

## 2017-09-01 DIAGNOSIS — I252 Old myocardial infarction: Secondary | ICD-10-CM | POA: Insufficient documentation

## 2017-09-01 DIAGNOSIS — Z79899 Other long term (current) drug therapy: Secondary | ICD-10-CM | POA: Diagnosis not present

## 2017-09-01 DIAGNOSIS — Z7984 Long term (current) use of oral hypoglycemic drugs: Secondary | ICD-10-CM | POA: Diagnosis not present

## 2017-09-01 DIAGNOSIS — Z9114 Patient's other noncompliance with medication regimen: Secondary | ICD-10-CM | POA: Diagnosis not present

## 2017-09-01 DIAGNOSIS — Z9884 Bariatric surgery status: Secondary | ICD-10-CM | POA: Diagnosis not present

## 2017-09-01 DIAGNOSIS — E1122 Type 2 diabetes mellitus with diabetic chronic kidney disease: Secondary | ICD-10-CM | POA: Insufficient documentation

## 2017-09-01 DIAGNOSIS — D508 Other iron deficiency anemias: Principal | ICD-10-CM | POA: Insufficient documentation

## 2017-09-01 DIAGNOSIS — Z79890 Hormone replacement therapy (postmenopausal): Secondary | ICD-10-CM | POA: Insufficient documentation

## 2017-09-01 DIAGNOSIS — K529 Noninfective gastroenteritis and colitis, unspecified: Secondary | ICD-10-CM | POA: Insufficient documentation

## 2017-09-01 DIAGNOSIS — G4730 Sleep apnea, unspecified: Secondary | ICD-10-CM | POA: Insufficient documentation

## 2017-09-01 DIAGNOSIS — K253 Acute gastric ulcer without hemorrhage or perforation: Secondary | ICD-10-CM

## 2017-09-01 DIAGNOSIS — Y838 Other surgical procedures as the cause of abnormal reaction of the patient, or of later complication, without mention of misadventure at the time of the procedure: Secondary | ICD-10-CM | POA: Diagnosis not present

## 2017-09-01 DIAGNOSIS — E785 Hyperlipidemia, unspecified: Secondary | ICD-10-CM | POA: Insufficient documentation

## 2017-09-01 DIAGNOSIS — K2270 Barrett's esophagus without dysplasia: Secondary | ICD-10-CM | POA: Diagnosis not present

## 2017-09-01 DIAGNOSIS — K229 Disease of esophagus, unspecified: Secondary | ICD-10-CM

## 2017-09-01 DIAGNOSIS — D649 Anemia, unspecified: Secondary | ICD-10-CM | POA: Diagnosis present

## 2017-09-01 HISTORY — DX: Other forms of dyspnea: R06.09

## 2017-09-01 LAB — TSH: TSH: 0.298 u[IU]/mL — ABNORMAL LOW (ref 0.350–4.500)

## 2017-09-01 LAB — GLUCOSE, CAPILLARY
GLUCOSE-CAPILLARY: 65 mg/dL (ref 65–99)
GLUCOSE-CAPILLARY: 92 mg/dL (ref 65–99)
GLUCOSE-CAPILLARY: 104 mg/dL — AB (ref 65–99)

## 2017-09-01 LAB — TROPONIN I
TROPONIN I: 0.03 ng/mL — AB (ref <0.03)
Troponin I: <0.03 ng/mL (ref <0.03)

## 2017-09-01 LAB — IRON AND TIBC
Iron: 43 ug/dL (ref 28–170)
SATURATION RATIOS: 10 % — AB (ref 10.4–31.8)
TIBC: 412 ug/dL (ref 250–450)
UIBC: 369 ug/dL

## 2017-09-01 LAB — COMPREHENSIVE METABOLIC PANEL
ALBUMIN: 3.6 g/dL (ref 3.5–5.0)
ALT: 14 U/L (ref 14–54)
AST: 16 U/L (ref 15–41)
Alkaline Phosphatase: 88 U/L (ref 38–126)
Anion gap: 10 (ref 5–15)
BUN: 18 mg/dL (ref 6–20)
CO2: 21 mmol/L — ABNORMAL LOW (ref 22–32)
CREATININE: 1.67 mg/dL — AB (ref 0.44–1.00)
Calcium: 8.4 mg/dL — ABNORMAL LOW (ref 8.9–10.3)
Chloride: 110 mmol/L (ref 101–111)
GFR calc Af Amer: 35 mL/min — ABNORMAL LOW (ref >60)
GFR, EST NON AFRICAN AMERICAN: 30 mL/min — AB (ref >60)
GLUCOSE: 94 mg/dL (ref 65–99)
POTASSIUM: 4.7 mmol/L (ref 3.5–5.1)
Sodium: 141 mmol/L (ref 135–145)
TOTAL PROTEIN: 6.6 g/dL (ref 6.5–8.1)
Total Bilirubin: 0.6 mg/dL (ref 0.3–1.2)

## 2017-09-01 LAB — CBC
HEMATOCRIT: 27 % — AB (ref 35.0–47.0)
Hemoglobin: 8.2 g/dL — ABNORMAL LOW (ref 12.0–16.0)
MCH: 21.7 pg — ABNORMAL LOW (ref 26.0–34.0)
MCHC: 30.2 g/dL — AB (ref 32.0–36.0)
MCV: 71.6 fL — AB (ref 80.0–100.0)
PLATELETS: 357 10*3/uL (ref 150–440)
RBC: 3.77 MIL/uL — ABNORMAL LOW (ref 3.80–5.20)
RDW: 18.8 % — AB (ref 11.5–14.5)
WBC: 5.4 10*3/uL (ref 3.6–11.0)

## 2017-09-01 LAB — FERRITIN: FERRITIN: 4 ng/mL — AB (ref 11–307)

## 2017-09-01 LAB — ABO/RH: ABO/RH(D): B POS

## 2017-09-01 LAB — HEMOGLOBIN A1C
HEMOGLOBIN A1C: 6 % — AB (ref 4.8–5.6)
MEAN PLASMA GLUCOSE: 125.5 mg/dL

## 2017-09-01 LAB — FOLATE: Folate: >100 ng/mL (ref >5.9)

## 2017-09-01 MED ORDER — ACETAMINOPHEN 325 MG PO TABS: 650.0000 mg | ORAL_TABLET | Freq: Four times a day (QID) | ORAL | Status: DC | PRN

## 2017-09-01 MED ORDER — PANTOPRAZOLE SODIUM 40 MG IV SOLR
40.0000 mg | Freq: Two times a day (BID) | INTRAVENOUS | Status: DC
  Administered 2017-09-01 – 2017-09-03 (×4): 40 mg via INTRAVENOUS
  Filled 2017-09-01 (×4): qty 40

## 2017-09-01 MED ORDER — GLIMEPIRIDE 1 MG PO TABS
1.0000 mg | ORAL_TABLET | Freq: Every day | ORAL | Status: DC
  Administered 2017-09-03: 1 mg via ORAL
  Filled 2017-09-01 (×2): qty 1

## 2017-09-01 MED ORDER — INSULIN ASPART 100 UNIT/ML ~~LOC~~ SOLN: 0.0000 [IU] | Freq: Every day | SUBCUTANEOUS | Status: DC

## 2017-09-01 MED ORDER — HEPARIN SODIUM (PORCINE) 5000 UNIT/ML IJ SOLN
5000.0000 [IU] | Freq: Three times a day (TID) | INTRAMUSCULAR | Status: DC
  Administered 2017-09-01 – 2017-09-02 (×2): 5000 [IU] via SUBCUTANEOUS
  Filled 2017-09-01 (×2): qty 1

## 2017-09-01 MED ORDER — ADULT MULTIVITAMIN W/MINERALS CH
1.0000 | ORAL_TABLET | Freq: Every day | ORAL | Status: DC
  Administered 2017-09-02 – 2017-09-03 (×2): 1 via ORAL
  Filled 2017-09-01 (×3): qty 1

## 2017-09-01 MED ORDER — SODIUM CHLORIDE 0.9 % IV SOLN: 250.0000 mL | INTRAVENOUS | Status: DC | PRN

## 2017-09-01 MED ORDER — AMLODIPINE BESYLATE 5 MG PO TABS
5.0000 mg | ORAL_TABLET | Freq: Every day | ORAL | Status: DC
  Administered 2017-09-01 – 2017-09-03 (×2): 5 mg via ORAL
  Filled 2017-09-01 (×3): qty 1

## 2017-09-01 MED ORDER — BENAZEPRIL HCL 40 MG PO TABS
40.0000 mg | ORAL_TABLET | Freq: Every day | ORAL | Status: DC
  Filled 2017-09-01: qty 1

## 2017-09-01 MED ORDER — CLONIDINE HCL 0.1 MG PO TABS
0.1000 mg | ORAL_TABLET | Freq: Two times a day (BID) | ORAL | Status: DC
  Administered 2017-09-01: 0.1 mg via ORAL
  Filled 2017-09-01 (×2): qty 1

## 2017-09-01 MED ORDER — ONDANSETRON HCL 4 MG/2ML IJ SOLN
4.0000 mg | Freq: Four times a day (QID) | INTRAMUSCULAR | Status: DC | PRN
  Administered 2017-09-01: 4 mg via INTRAVENOUS
  Filled 2017-09-01: qty 2

## 2017-09-01 MED ORDER — VITAMIN D3 25 MCG (1000 UNIT) PO TABS
1000.0000 [IU] | ORAL_TABLET | Freq: Every day | ORAL | Status: DC
  Administered 2017-09-02 – 2017-09-03 (×2): 1000 [IU] via ORAL
  Filled 2017-09-01 (×4): qty 1

## 2017-09-01 MED ORDER — LEVOTHYROXINE SODIUM 50 MCG PO TABS
150.0000 ug | ORAL_TABLET | Freq: Every day | ORAL | Status: DC
  Administered 2017-09-02 – 2017-09-03 (×2): 150 ug via ORAL
  Filled 2017-09-01 (×2): qty 1

## 2017-09-01 MED ORDER — COQ10 100 MG PO CAPS: 1.0000 | ORAL_CAPSULE | Freq: Every day | ORAL | Status: DC

## 2017-09-01 MED ORDER — INSULIN ASPART 100 UNIT/ML ~~LOC~~ SOLN
0.0000 [IU] | Freq: Three times a day (TID) | SUBCUTANEOUS | Status: DC
Start: 2017-09-01 — End: 2017-09-01

## 2017-09-01 MED ORDER — HYDRALAZINE HCL 50 MG PO TABS
50.0000 mg | ORAL_TABLET | Freq: Three times a day (TID) | ORAL | Status: DC
  Administered 2017-09-01: 50 mg via ORAL
  Filled 2017-09-01 (×2): qty 1

## 2017-09-01 MED ORDER — MULTI-VITAMIN/MINERALS PO TABS: 1.0000 | ORAL_TABLET | Freq: Every day | ORAL | Status: DC

## 2017-09-01 MED ORDER — FUROSEMIDE 40 MG PO TABS: 40.0000 mg | ORAL_TABLET | Freq: Every day | ORAL | Status: DC | PRN

## 2017-09-01 MED ORDER — SODIUM CHLORIDE 0.9% FLUSH: 3.0000 mL | INTRAVENOUS | Status: DC | PRN

## 2017-09-01 MED ORDER — SODIUM CHLORIDE 0.9% FLUSH
3.0000 mL | Freq: Two times a day (BID) | INTRAVENOUS | Status: DC
  Administered 2017-09-01 – 2017-09-03 (×4): 3 mL via INTRAVENOUS

## 2017-09-01 MED ORDER — ACETAMINOPHEN 650 MG RE SUPP: 650.0000 mg | Freq: Four times a day (QID) | RECTAL | Status: DC | PRN

## 2017-09-01 MED ORDER — ONDANSETRON HCL 4 MG PO TABS: 4.0000 mg | ORAL_TABLET | Freq: Four times a day (QID) | ORAL | Status: DC | PRN

## 2017-09-01 MED ORDER — IRON SUCROSE 20 MG/ML IV SOLN
300.0000 mg | Freq: Once | INTRAVENOUS | Status: AC
  Administered 2017-09-01: 300 mg via INTRAVENOUS
  Filled 2017-09-01: qty 15

## 2017-09-01 MED ORDER — INSULIN ASPART 100 UNIT/ML ~~LOC~~ SOLN: 0.0000 [IU] | Freq: Three times a day (TID) | SUBCUTANEOUS | Status: DC

## 2017-09-01 MED ORDER — VITAMIN B-12 1000 MCG PO TABS
1000.0000 ug | ORAL_TABLET | Freq: Every day | ORAL | Status: DC
  Administered 2017-09-01 – 2017-09-03 (×3): 1000 ug via ORAL
  Filled 2017-09-01 (×4): qty 1

## 2017-09-01 MED ORDER — HYDROCODONE-ACETAMINOPHEN 5-325 MG PO TABS: 1.0000 | ORAL_TABLET | ORAL | Status: DC | PRN

## 2017-09-01 MED ORDER — OMEGA-3-ACID ETHYL ESTERS 1 G PO CAPS
1.0000 g | ORAL_CAPSULE | Freq: Two times a day (BID) | ORAL | Status: DC
  Administered 2017-09-01 – 2017-09-03 (×4): 1 g via ORAL
  Filled 2017-09-01 (×4): qty 1

## 2017-09-01 MED ORDER — PRAVASTATIN SODIUM 20 MG PO TABS
80.0000 mg | ORAL_TABLET | Freq: Every day | ORAL | Status: DC
  Administered 2017-09-02: 80 mg via ORAL
  Filled 2017-09-01 (×2): qty 4

## 2017-09-01 MED ORDER — INSULIN ASPART 100 UNIT/ML ~~LOC~~ SOLN
0.0000 [IU] | Freq: Three times a day (TID) | SUBCUTANEOUS | Status: DC
  Administered 2017-09-03: 3 [IU] via SUBCUTANEOUS
  Filled 2017-09-01: qty 1

## 2017-09-01 NOTE — ED Notes (Signed)
Pt sent from PCP for possible admission from [facility] due to low Hbg. Pt states she went to see here PCP last week due to generalized weakness. States she has a hx of anemia with blood transfusion in the past. Denies any black or bloody stools. Denies pain.. Denies SOB.

## 2017-09-01 NOTE — H&P (Signed)
[place] [facility] [place] NAME: [name]    MR#:  [id number]  DATE OF BIRTH:  05-12-1951  DATE OF ADMISSION:  09/01/2017  PRIMARY CARE PHYSICIAN: Glendon Axe, MD   REQUESTING/REFERRING PHYSICIAN:   CHIEF COMPLAINT:   Chief Complaint  Patient presents with  . Abnormal Lab  . Abdominal Pain    HISTORY OF PRESENT ILLNESS: [name]  is a 67 y.o. 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  PAST MEDICAL HISTORY:   Past Medical History:  Diagnosis Date  . Anemia   . Anemia, iron deficiency 11/07/2013  . Arthritis   . CAD (coronary artery disease)   . Cerebrovascular accident ([place])   . Chicken pox   . Chronic kidney disease   . Colitis    ? at 73  . Diabetes mellitus   . Hashimoto thyroiditis    w subsequent hypothyroidism  . Headache   . HTN (hypertension)   . Hyperlipidemia   . Hypothyroidism   . Myocardial infarction ([place])   . 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 Sleep apnea     PAST SURGICAL HISTORY:  Past Surgical History:  Procedure Laterality Date  . ABDOMINAL HYSTERECTOMY    . CHOLECYSTECTOMY    . COLONOSCOPY WITH PROPOFOL N/A 12/24/2014   Procedure: COLONOSCOPY WITH PROPOFOL;  Surgeon: Hulen Luster, MD;   Location: [facility] ENDOSCOPY;  Service: Gastroenterology;  Laterality: N/A;  . CORONARY ARTERY BYPASS GRAFT    . ESOPHAGOGASTRODUODENOSCOPY (EGD) WITH PROPOFOL N/A 12/24/2014   Procedure: ESOPHAGOGASTRODUODENOSCOPY (EGD) WITH PROPOFOL;  Surgeon: Hulen Luster, MD;  Location: [facility] ENDOSCOPY;  Service: Gastroenterology;  Laterality: N/A;  . JOINT REPLACEMENT    . ROUX-EN-Y GASTRIC BYPASS     loss over 100 pounds  . TONSILLECTOMY    . TOTAL KNEE ARTHROPLASTY Left     SOCIAL HISTORY:  Social History   Tobacco Use  . Smoking status: Never Smoker  . Smokeless tobacco: Never Used  Substance Use Topics  . Alcohol use: No    FAMILY HISTORY:  Family History  Problem Relation Age of Onset  . Coronary artery disease Other   . Stroke Maternal Grandmother   . Diabetes Paternal Grandmother        diabetic coma  . Heart attack Father   . Heart attack Mother   . Heart attack Maternal Grandfather   . Heart attack Paternal Grandfather     DRUG ALLERGIES:  Allergies  Allergen Reactions  . Cardura [Doxazosin Mesylate] Other (See Comments)    Dizzy with passing out spells  . Morphine And Related Nausea And Vomiting  . Ultram [Tramadol Hcl]   . Baclofen Other (See Comments)    Dizziness    REVIEW OF SYSTEMS:   CONSTITUTIONAL: No fever, +fatigue , weakness.  EYES: No blurred or double vision.  EARS, NOSE,  AND THROAT: No tinnitus or ear pain.  RESPIRATORY: No cough, shortness of breath, wheezing or hemoptysis.  CARDIOVASCULAR: No chest pain, orthopnea, edema.  GASTROINTESTINAL: No nausea, vomiting, acute on chronic loose stools/diarrhea, + abdominal pain.  GENITOURINARY: No dysuria, hematuria.  ENDOCRINE: No polyuria, nocturia,  HEMATOLOGY: No anemia, easy bruising or bleeding SKIN: No rash or lesion. MUSCULOSKELETAL: No joint pain or arthritis.   NEUROLOGIC: No tingling, numbness, weakness.  PSYCHIATRY: No anxiety or depression.   MEDICATIONS AT HOME:  Prior to Admission medications    Medication Sig Start Date End Date Taking? Authorizing Provider  amLODipine (NORVASC) 10 MG tablet Take 1 tablet (10 mg total) by mouth daily. Patient taking differently: Take 5 mg by mouth daily.  08/21/13  Yes Susy Frizzle, MD  benazepril (LOTENSIN) 40 MG tablet TAKE ONE TABLET BY MOUTH ONCE DAILY 08/24/14  Yes Susy Frizzle, MD  Cholecalciferol (VITAMIN D3) 1000 UNITS CAPS Take 1,000 Units by mouth daily.    Yes [provider]  cloNIDine (CATAPRES) 0.1 MG tablet TAKE ONE TABLET BY MOUTH TWICE DAILY (PATIENT  NEEDS  OFFICE  VISIT  BEFORE  ANY  FURTHER  REFILLS  CAN  BE  GIVEN) 07/24/14  Yes Susy Frizzle, MD  Coenzyme Q10 (COQ10) 100 MG CAPS Take 1 capsule by mouth daily.    Yes [provider]  furosemide (LASIX) 40 MG tablet Take 40 mg by mouth daily as needed for edema.    Yes [provider]  glimepiride (AMARYL) 1 MG tablet Take 1 tablet by mouth daily. 06/15/17  Yes [provider]  hydrALAZINE (APRESOLINE) 25 MG tablet Take 1 tablet (25 mg total) by mouth 3 (three) times daily. Patient taking differently: Take 50 mg by mouth 3 (three) times daily.  08/09/13  Yes Susy Frizzle, MD  levothyroxine (SYNTHROID, LEVOTHROID) 100 MCG tablet Take 1 tablet (100 mcg total) by mouth daily. Patient taking differently: Take 150 mcg by mouth daily.  08/21/13  Yes Susy Frizzle, MD  lovastatin (MEVACOR) 20 MG tablet Take 2 tablets (40 mg total) by mouth at bedtime. 02/06/14  Yes Susy Frizzle, MD  Multiple Vitamins-Minerals (MULTIVITAMIN WITH MINERALS) tablet Take 1 tablet by mouth daily.   Yes [provider]  Omega-3 Fatty Acids (FISH OIL) 1200 MG CAPS Take 1 capsule by mouth daily.    Yes [provider]  vitamin B-12 (CYANOCOBALAMIN) 1000 MCG tablet Take 1,000 mcg by mouth daily.   Yes [provider]      PHYSICAL EXAMINATION:   VITAL SIGNS: Blood pressure (!) 157/71, pulse 68, temperature 98.1 F (36.7 C),  temperature source Oral, resp. rate 16, height 5\' 5"  (1.651 m), weight 101.6 kg (224 lb), SpO2 100 %.  GENERAL:  67 y.o.-year-old patient lying in the bed with no acute distress.  Obesity EYES: Pupils equal, round, reactive to light and accommodation. No scleral icterus. Extraocular muscles intact.  HEENT: Head atraumatic, normocephalic. Oropharynx and nasopharynx clear.  NECK:  Supple, no jugular venous distention. No thyroid enlargement, no tenderness.  LUNGS: Normal breath sounds bilaterally, no wheezing, rales,rhonchi or crepitation. No use of accessory muscles of respiration.  CARDIOVASCULAR: S1, S2 normal. No murmurs, rubs, or gallops.  ABDOMEN: Soft, nontender, nondistended. Bowel sounds present. No organomegaly or mass.  EXTREMITIES: No pedal edema, cyanosis, or clubbing.  NEUROLOGIC: Cranial nerves II through XII are intact. MAES. Gait not checked.  PSYCHIATRIC: The patient is alert and oriented x 3.  SKIN: No obvious rash,  lesion, or ulcer.   LABORATORY PANEL:   CBC Recent Labs  Lab 09/01/17 1301  WBC 5.4  HGB 8.2*  HCT 27.0*  PLT 357  MCV 71.6*  MCH 21.7*  MCHC 30.2*  RDW 18.8*   ------------------------------------------------------------------------------------------------------------------  Chemistries  Recent Labs  Lab 09/01/17 1301  NA 141  K 4.7  CL 110  CO2 21*  GLUCOSE 94  BUN 18  CREATININE 1.67*  CALCIUM 8.4*  AST 16  ALT 14  ALKPHOS 88  BILITOT 0.6   ------------------------------------------------------------------------------------------------------------------ estimated creatinine clearance is 38.1 mL/min (A) (by C-G formula based on SCr of 1.67 mg/dL (H)). ------------------------------------------------------------------------------------------------------------------ No results for input(s): TSH, T4TOTAL, T3FREE, THYROIDAB in the last 72 hours.  Invalid input(s): FREET3   Coagulation profile No results for input(s): INR, PROTIME in  the last 168 hours. ------------------------------------------------------------------------------------------------------------------- No results for input(s): DDIMER in the last 72 hours. -------------------------------------------------------------------------------------------------------------------  Cardiac Enzymes Recent Labs  Lab 09/01/17 1301  TROPONINI 0.03*   ------------------------------------------------------------------------------------------------------------------ Invalid input(s): POCBNP  ---------------------------------------------------------------------------------------------------------------  Urinalysis    Component Value Date/Time   COLORURINE YELLOW 07/25/2007 0910   APPEARANCEUR CLEAR 07/25/2007 0910   LABSPEC 1.026 07/25/2007 0910   PHURINE 5.0 07/25/2007 0910   GLUCOSEU >1000 (A) 07/25/2007 0910   HGBUR NEGATIVE 07/25/2007 0910   BILIRUBINUR NEGATIVE 07/25/2007 0910   KETONESUR NEGATIVE 07/25/2007 0910   PROTEINUR NEGATIVE 07/25/2007 0910   UROBILINOGEN 0.2 07/25/2007 0910   NITRITE NEGATIVE 07/25/2007 0910   LEUKOCYTESUR TRACE (A) 07/25/2007 0910     RADIOLOGY: No results found.  EKG: Orders placed or performed during the hospital encounter of 09/01/17  . ED EKG  . ED EKG  . EKG 12-Lead  . EKG 12-Lead    IMPRESSION AND PLAN: 1 acute on chronic anemia Sent from primary care providers office for GI workup, evaluation for upper and lower endoscopy Admit to regular nursing for bed, consult gastroenterology for expert opinion, check anemia workup, currently receiving IV iron in the emergency room, for 1 unit packed red blood cell transfusion, CBC daily, transfuse as needed, guaiac all stools  2 acute on chronic loose stools/?  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 Management plans discussed with the patient, family and they are in agreement.  CODE STATUS:full   TOTAL TIME TAKING CARE OF THIS PATIENT: 45 minutes.    Avel Peace Peniel Biel M.D on 09/01/2017   Between 7am to 6pm - Pager - [phone]  After 6pm go to [email or web address] - password [email or web address] [facility]  Office  [phone]  CC: Primary care physician; Glendon Axe, MD   Note: This dictation was prepared with Dragon dictation along with smaller phrase technology. Any transcriptional errors that result from this process are unintentional.

## 2017-09-01 NOTE — ED Notes (Signed)
Pt ambulatory to desk, states sent here by PCP for "GI testing". Reports low hemoglobin. Pt in NAD.

## 2017-09-01 NOTE — ED Triage Notes (Addendum)
Pt sent by PCP "because my hgb is low" - the PCP told the pt that she needed to be admitted for anemia and GI work up - pt denies blood in vomit or stool - pt is c/o abd pain when she eats - pt states that she has a paste like stool - c/o weakness and shortness of breath with any exertion

## 2017-09-01 NOTE — ED Provider Notes (Signed)
[facility] Provider Note       Time seen: ----------------------------------------- 1:10 PM on 09/01/2017 -----------------------------------------   I have reviewed the triage vital signs and the nursing notes.  HISTORY   Chief Complaint Abnormal Lab and Abdominal Pain    HPI [name] is a 67 y.o. female with a history of anemia, arthritis, coronary artery disease, CVA, chronic kidney disease, diabetes, hyperlipidemia, hypothyroidism, gastric bypass with Roux-en-Y who presents to the ED for possible anemia.  Patient was sent by her primary care doctor because her hemoglobin was reportedly low from blood work done last week.  Her primary care doctor told her she needed to be admitted for anemia and to have an upper and lower GI.  Patient denies any blood in her stool or urine.  She does complain of some abdominal pain when she eats.  She claims of weakness and shortness of breath with any exertion that is been going on for probably 6 months.  Past Medical History:  Diagnosis Date  . Anemia   . Anemia, iron deficiency 11/07/2013  . Arthritis   . CAD (coronary artery disease)   . Cerebrovascular accident ([place])   . Chicken pox   . Chronic kidney disease   . Colitis    ? at 61  . Diabetes mellitus   . Hashimoto thyroiditis    w subsequent hypothyroidism  . Headache   . HTN (hypertension)   . Hyperlipidemia   . Hypothyroidism   . Myocardial infarction ([place])   . 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 Sleep apnea     Patient Active Problem List   Diagnosis Date Noted  . Anemia, iron deficiency 11/07/2013  . 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 Heme + stool 11/07/2013  . HYPERTENSION, BENIGN 11/10/2008  . CAD, ARTERY BYPASS GRAFT 11/10/2008    Past Surgical History:  Procedure Laterality Date  . ABDOMINAL HYSTERECTOMY    . CHOLECYSTECTOMY    .  COLONOSCOPY WITH PROPOFOL N/A 12/24/2014   Procedure: COLONOSCOPY WITH PROPOFOL;  Surgeon: Hulen Luster, MD;  Location: [facility] ENDOSCOPY;  Service: Gastroenterology;  Laterality: N/A;  . CORONARY ARTERY BYPASS GRAFT    . ESOPHAGOGASTRODUODENOSCOPY (EGD) WITH PROPOFOL N/A 12/24/2014   Procedure: ESOPHAGOGASTRODUODENOSCOPY (EGD) WITH PROPOFOL;  Surgeon: Hulen Luster, MD;  Location: [facility] ENDOSCOPY;  Service: Gastroenterology;  Laterality: N/A;  . JOINT REPLACEMENT    . ROUX-EN-Y GASTRIC BYPASS     loss over 100 pounds  . TONSILLECTOMY    . TOTAL KNEE ARTHROPLASTY Left     Allergies Cardura [doxazosin mesylate]; Morphine and related; and Ultram [tramadol hcl]  Social History Social History   Tobacco Use  . Smoking status: Never Smoker  . Smokeless tobacco: Never Used  Substance Use Topics  . Alcohol use: No  . Drug use: No   Review of Systems Constitutional: Negative for fever. Cardiovascular: Negative for chest pain. Respiratory: Positive for shortness of breath Gastrointestinal: Positive for abdominal pain Genitourinary: Negative for dysuria. Musculoskeletal: Negative for back pain. Skin: Negative for rash. Neurological: Negative for headaches, positive for generalized weakness  All systems negative/normal/unremarkable except as stated in the HPI  ____________________________________________   PHYSICAL EXAM:  VITAL SIGNS: ED Triage Vitals  Enc Vitals Group     BP 09/01/17 1242 (!) 154/72     Pulse Rate 09/01/17 1242 66     Resp 09/01/17 1242 17     Temp 09/01/17 1242 98.1  F (36.7 C)     Temp Source 09/01/17 1242 Oral     SpO2 09/01/17 1242 97 %     Weight 09/01/17 1241 224 lb (101.6 kg)     Height 09/01/17 1241 5\' 5"  (1.651 m)     Head Circumference --      Peak Flow --      Pain Score 09/01/17 1253 0     Pain Loc --      Pain Edu? --      Excl. in [place]? --    Constitutional: Alert and oriented. Well appearing and in no distress. Eyes: Conjunctivae are pale.  Normal  extraocular movements. ENT   Head: Normocephalic and atraumatic.   Nose: No congestion/rhinnorhea.   Mouth/Throat: Mucous membranes are moist.   Neck: No stridor. Cardiovascular: Normal rate, regular rhythm. No murmurs, rubs, or gallops. Respiratory: Normal respiratory effort without tachypnea nor retractions. Breath sounds are clear and equal bilaterally. No wheezes/rales/rhonchi. Gastrointestinal: Soft and nontender. Normal bowel sounds. Heme negative stool Musculoskeletal: Nontender with normal range of motion in extremities. No lower extremity tenderness nor edema. Neurologic:  Normal speech and language. No gross focal neurologic deficits are appreciated.  Skin:  Skin is warm, dry and intact. No rash noted. Psychiatric: Mood and affect are normal. Speech and behavior are normal.  ____________________________________________  EKG: Interpreted by me.  Sinus rhythm the rate is 67 bpm, normal PR interval, nonspecific intraventricular conduction delay, leftward axis  ____________________________________________  ED COURSE:  As part of my medical decision making, I reviewed the following data within the [place] History obtained from family if available, nursing notes, old chart and ekg, as well as notes from prior ED visits. Patient presented for weakness and possible anemia, we will assess with labs as indicated at this time.   Procedures ____________________________________________   LABS (pertinent positives/negatives)  Labs Reviewed  COMPREHENSIVE METABOLIC PANEL - Abnormal; Notable for the following components:      Result Value   CO2 21 (*)    Creatinine, Ser 1.67 (*)    Calcium 8.4 (*)    GFR calc non Af Amer 30 (*)    GFR calc Af Amer 35 (*)    All other components within normal limits  CBC - Abnormal; Notable for the following components:   RBC 3.77 (*)    Hemoglobin 8.2 (*)    HCT 27.0 (*)    MCV 71.6 (*)    MCH 21.7 (*)    MCHC  30.2 (*)    RDW 18.8 (*)    All other components within normal limits  TROPONIN I - Abnormal; Notable for the following components:   Troponin I 0.03 (*)    All other components within normal limits  POC OCCULT BLOOD, ED  TYPE AND SCREEN  ABO/RH   ____________________________________________  DIFFERENTIAL DIAGNOSIS   Anemia, GI bleeding, dehydration, electrolyte abnormality  FINAL ASSESSMENT AND PLAN  Anemia, weakness, elevated troponin   Plan: The patient had presented for weakness. Patient's labs do reveal an iron deficiency anemia although it is slightly improved over the last week.  She was heme negative here rectally, I discussed with her primary care doctor who is worried about her anemia as well as her needing an upper and joint lower GI and possible stress test.  Symptoms are likely secondary to iron deficiency anemia.  I will discussed with the hospitalist as to whether or not this patient can be admitted.  I have ordered IV iron  for the patient as well.   Laurence Aly, MD   Note: This note was generated in part or whole with voice recognition software. Voice recognition is usually quite accurate but there are transcription errors that can and very often do occur. I apologize for any typographical errors that were not detected and corrected.     Earleen Newport, MD 09/01/17 [phone]

## 2017-09-01 NOTE — Progress Notes (Signed)
PHARMACIST - PHYSICIAN ORDER COMMUNICATION  CONCERNING: P&T Medication Policy on Herbal Medications  DESCRIPTION:  This patient's order for:  CoQ10  has been noted.  This product(s) is classified as an "herbal" or natural product. Due to a lack of definitive safety studies or FDA approval, nonstandard manufacturing practices, plus the potential risk of unknown drug-drug interactions while on inpatient medications, the Pharmacy and Therapeutics Committee does not permit the use of "herbal" or natural products of this type within [facility].   ACTION TAKEN: The pharmacy department is unable to verify this order at this time and your patient has been informed of this safety policy. Please reevaluate patient's clinical condition at discharge and address if the herbal or natural product(s) should be resumed at that time.  Lendon Ka, PharmD Pharmacy Resident

## 2017-09-01 NOTE — ED Notes (Signed)
Date and time results received: 09/01/17 0503 (use smartphrase ".now" to insert current time)  Test: troponin Critical Value: 0.03  Name of Provider Notified: williams

## 2017-09-01 NOTE — Progress Notes (Signed)
Family Meeting Note  Advance Directive:yes  Today a meeting took place with the Patient.  pt is able to participate    The following clinical team members were present during this meeting:MD  The following were discussed:Patient's diagnosis: Chronic anemia, coronary artery disease, CVA, chronic kidney disease, diabetes, hypothyroidism, hypertension, hyperlipidemia, malabsorption syndrome secondary to gastric bypass surgery, obesity, Patient's progosis: > 12 months and Goals for treatment: Full Code  Additional follow-up to be provided: prn  Time spent during discussion:20 minutes  Gorden Harms, MD

## 2017-09-02 ENCOUNTER — Encounter
Admission: EM | Disposition: A | Discharge status: Home / Self Care | Payer: Self-pay | Source: Home / Self Care | Attending: Emergency Medicine

## 2017-09-02 ENCOUNTER — Observation Stay: Payer: Medicare Other | Admitting: Anesthesiology

## 2017-09-02 ENCOUNTER — Encounter: Payer: Self-pay | Admitting: *Deleted

## 2017-09-02 DIAGNOSIS — D500 Iron deficiency anemia secondary to blood loss (chronic): Secondary | ICD-10-CM

## 2017-09-02 DIAGNOSIS — K229 Disease of esophagus, unspecified: Secondary | ICD-10-CM

## 2017-09-02 DIAGNOSIS — Z9884 Bariatric surgery status: Secondary | ICD-10-CM | POA: Diagnosis not present

## 2017-09-02 DIAGNOSIS — K253 Acute gastric ulcer without hemorrhage or perforation: Secondary | ICD-10-CM

## 2017-09-02 DIAGNOSIS — D508 Other iron deficiency anemias: Secondary | ICD-10-CM | POA: Diagnosis not present

## 2017-09-02 HISTORY — PX: ESOPHAGOGASTRODUODENOSCOPY: SHX5428

## 2017-09-02 LAB — GLUCOSE, CAPILLARY
GLUCOSE-CAPILLARY: 59 mg/dL — AB (ref 65–99)
GLUCOSE-CAPILLARY: 69 mg/dL (ref 65–99)
GLUCOSE-CAPILLARY: 70 mg/dL (ref 65–99)
Glucose-Capillary: 62 mg/dL — ABNORMAL LOW (ref 65–99)
Glucose-Capillary: 103 mg/dL — ABNORMAL HIGH (ref 65–99)
Glucose-Capillary: 71 mg/dL (ref 65–99)
Glucose-Capillary: 80 mg/dL (ref 65–99)
Glucose-Capillary: 68 mg/dL (ref 65–99)
Glucose-Capillary: 145 mg/dL — ABNORMAL HIGH (ref 65–99)
Glucose-Capillary: 106 mg/dL — ABNORMAL HIGH (ref 65–99)

## 2017-09-02 LAB — URINALYSIS, COMPLETE (UACMP) WITH MICROSCOPIC
BILIRUBIN URINE: NEGATIVE
Glucose, UA: NEGATIVE mg/dL
HGB URINE DIPSTICK: NEGATIVE
KETONES UR: NEGATIVE mg/dL
NITRITE: POSITIVE — AB
Protein, ur: 30 mg/dL — AB
Specific Gravity, Urine: 1.011 (ref 1.005–1.030)
pH: 5 (ref 5.0–8.0)

## 2017-09-02 LAB — CBC
HEMATOCRIT: 26.4 % — AB (ref 35.0–47.0)
Hemoglobin: 7.9 g/dL — ABNORMAL LOW (ref 12.0–16.0)
MCH: 21.9 pg — AB (ref 26.0–34.0)
MCHC: 29.7 g/dL — AB (ref 32.0–36.0)
MCV: 73.6 fL — ABNORMAL LOW (ref 80.0–100.0)
Platelets: 293 10*3/uL (ref 150–440)
RBC: 3.59 MIL/uL — ABNORMAL LOW (ref 3.80–5.20)
RDW: 18.2 % — AB (ref 11.5–14.5)
WBC: 5 10*3/uL (ref 3.6–11.0)

## 2017-09-02 LAB — IRON AND TIBC
IRON: 295 ug/dL — AB (ref 28–170)
SATURATION RATIOS: 89 % — AB (ref 10.4–31.8)
TIBC: 332 ug/dL (ref 250–450)
UIBC: 37 ug/dL

## 2017-09-02 LAB — RETICULOCYTES
RBC.: 3.65 MIL/uL — AB (ref 3.80–5.20)
Retic Count, Absolute: 62.1 10*3/uL (ref 19.0–183.0)
Retic Ct Pct: 1.7 % (ref 0.4–3.1)

## 2017-09-02 LAB — FERRITIN: Ferritin: 46 ng/mL (ref 11–307)

## 2017-09-02 LAB — TROPONIN I
Troponin I: <0.03 ng/mL
Troponin I: <0.03 ng/mL (ref <0.03)

## 2017-09-02 LAB — VITAMIN B12
Vitamin B-12: 2870 pg/mL — ABNORMAL HIGH (ref 180–914)
Vitamin B-12: 3056 pg/mL — ABNORMAL HIGH (ref 180–914)

## 2017-09-02 LAB — FOLATE: FOLATE: 70.5 ng/mL (ref >5.9)

## 2017-09-02 LAB — GLUCOSE, RANDOM: Glucose, Bld: 97 mg/dL (ref 65–99)

## 2017-09-02 SURGERY — EGD (ESOPHAGOGASTRODUODENOSCOPY)
Anesthesia: General

## 2017-09-02 MED ORDER — PROPOFOL 10 MG/ML IV BOLUS
INTRAVENOUS | Status: DC | PRN
  Administered 2017-09-02: 60 mg via INTRAVENOUS
  Administered 2017-09-02: 20 mg via INTRAVENOUS

## 2017-09-02 MED ORDER — DEXTROSE 50 % IV SOLN
25.0000 mL | Freq: Once | INTRAVENOUS | Status: AC
  Administered 2017-09-02: 25 mL via INTRAVENOUS

## 2017-09-02 MED ORDER — LIDOCAINE HCL (CARDIAC) 20 MG/ML IV SOLN
INTRAVENOUS | Status: DC | PRN
  Administered 2017-09-02: 50 mg via INTRAVENOUS

## 2017-09-02 MED ORDER — PROPOFOL 10 MG/ML IV BOLUS
INTRAVENOUS | Status: AC
  Filled 2017-09-02: qty 20

## 2017-09-02 MED ORDER — SODIUM CHLORIDE 0.9 % IV SOLN
200.0000 mg | INTRAVENOUS | Status: DC
  Filled 2017-09-02 (×2): qty 10

## 2017-09-02 MED ORDER — SODIUM CHLORIDE 0.9 % IV SOLN: INTRAVENOUS | Status: DC

## 2017-09-02 MED ORDER — PROPOFOL 500 MG/50ML IV EMUL
INTRAVENOUS | Status: DC | PRN
  Administered 2017-09-02: 130 ug/kg/min via INTRAVENOUS

## 2017-09-02 MED ORDER — MIDAZOLAM HCL 2 MG/2ML IJ SOLN
INTRAMUSCULAR | Status: AC
  Filled 2017-09-02: qty 2

## 2017-09-02 MED ORDER — DEXTROSE 5 % IV SOLN
INTRAVENOUS | Status: DC
  Administered 2017-09-02: 1000 mL via INTRAVENOUS

## 2017-09-02 NOTE — Anesthesia Preprocedure Evaluation (Addendum)
Anesthesia Evaluation  Patient identified by MRN, date of birth, ID band Patient awake    Reviewed: Allergy & Precautions, H&P , NPO status , reviewed documented beta blocker date and time   Airway Mallampati: II  TM Distance: >3 FB     Dental  (+) Poor Dentition, Chipped, Missing   Pulmonary sleep apnea ,    Pulmonary exam normal        Cardiovascular hypertension, + CAD, + Past MI and + Peripheral Vascular Disease  Normal cardiovascular exam  SUMMARY - Left ventricular ejection fraction was estimated , range being 55    % to 65 %. Left ventricular wall thickness was moderately to    markedly increased. - There was extensive, mild atheroma of the aortic arch with no    ulceration and no associated thrombus. There was an    extensive, moderate atheroma of the descending aorta with no    ulceration and no associated thrombus. - The left atrial appendage function was normal (normal emptying    velocity). There was no left atrial appendage thrombus    identified.   Neuro/Psych  Headaches, CVA, No Residual Symptoms    GI/Hepatic   Endo/Other  diabetesHypothyroidism   Renal/GU Renal disease     Musculoskeletal  (+) Arthritis , Osteoarthritis,    Abdominal   Peds  Hematology  (+) anemia ,   Anesthesia Other Findings HYPERTENSION, BENIGN CAD, ARTERY BYPASS GRAFT  Anemia, iron deficiency  malabsorption - s/p gastric bypass roux-en-Y Heme + stool  Reproductive/Obstetrics                            Anesthesia Physical Anesthesia Plan  ASA: III  Anesthesia Plan: General   Post-op Pain Management:    Induction:   PONV Risk Score and Plan: 3 and Propofol infusion and TIVA  Airway Management Planned:   Additional Equipment:   Intra-op Plan:   Post-operative Plan:   Informed Consent: I have reviewed the patients History and Physical, chart, labs and  discussed the procedure including the risks, benefits and alternatives for the proposed anesthesia with the patient or authorized representative who has indicated his/her understanding and acceptance.   Dental Advisory Given  Plan Discussed with: CRNA  Anesthesia Plan Comments:       Anesthesia Quick Evaluation

## 2017-09-02 NOTE — Progress Notes (Signed)
[place] [facility] [place] NAME: [name]    MR#:  [id number]  DATE OF BIRTH:  Oct 18, 1950  SUBJECTIVE:  CHIEF COMPLAINT:   Chief Complaint  Patient presents with  . Abnormal Lab  . Abdominal Pain   -no nausea, vomiting or abdominal pain today. No diarrhea -Low ferritin levels on labs. For EGD today  REVIEW OF SYSTEMS:  Review of Systems  Constitutional: Positive for malaise/fatigue. Negative for chills and fever.  HENT: Negative for congestion, ear discharge, hearing loss and nosebleeds.   Eyes: Negative for blurred vision and double vision.  Respiratory: Negative for cough, shortness of breath and wheezing.   Cardiovascular: Negative for chest pain, palpitations and leg swelling.  Gastrointestinal: Negative for abdominal pain, constipation, diarrhea, nausea and vomiting.  Genitourinary: Negative for dysuria.  Musculoskeletal: Positive for myalgias.  Neurological: Negative for dizziness, focal weakness, seizures, weakness and headaches.  Psychiatric/Behavioral: Negative for depression.    DRUG ALLERGIES:   Allergies  Allergen Reactions  . Cardura [Doxazosin Mesylate] Other (See Comments)    Dizzy with passing out spells  . Morphine And Related Nausea And Vomiting  . Ultram [Tramadol Hcl]   . Baclofen Other (See Comments)    Dizziness    VITALS:  Blood pressure (!) 146/57, pulse 68, temperature 98.2 F (36.8 C), temperature source Oral, resp. rate 16, height _0  (1.626 m), weight 99.1 kg (218 lb 7.6 oz), SpO2 94 %.  PHYSICAL EXAMINATION:  Physical Exam  GENERAL:  67 y.o.-year-old patient lying in the bed with no acute distress.  EYES: Pupils equal, round, reactive to light and accommodation. No scleral icterus. Extraocular muscles intact.  HEENT: Head atraumatic, normocephalic. Oropharynx and nasopharynx clear.  NECK:  Supple, no jugular venous distention. No thyroid enlargement, no tenderness.  LUNGS: Normal breath  sounds bilaterally, no wheezing, rales,rhonchi or crepitation. No use of accessory muscles of respiration.  CARDIOVASCULAR: S1, S2 normal. No murmurs, rubs, or gallops.  ABDOMEN: Soft, nontender, nondistended. Bowel sounds present. No organomegaly or mass.  EXTREMITIES: No pedal edema, cyanosis, or clubbing.  NEUROLOGIC: Cranial nerves II through XII are intact. Muscle strength 5/5 in all extremities. Sensation intact. Gait not checked.  PSYCHIATRIC: The patient is alert and oriented x 3.  SKIN: No obvious rash, lesion, or ulcer.    LABORATORY PANEL:   CBC Recent Labs  Lab 09/02/17 0602  WBC 5.0  HGB 7.9*  HCT 26.4*  PLT 293   ------------------------------------------------------------------------------------------------------------------  Chemistries  Recent Labs  Lab 09/01/17 1301 09/02/17 0910  NA 141  --   K 4.7  --   CL 110  --   CO2 21*  --   GLUCOSE 94 97  BUN 18  --   CREATININE 1.67*  --   CALCIUM 8.4*  --   AST 16  --   ALT 14  --   ALKPHOS 88  --   BILITOT 0.6  --    ------------------------------------------------------------------------------------------------------------------  Cardiac Enzymes Recent Labs  Lab 09/02/17 0602  TROPONINI <0.03   ------------------------------------------------------------------------------------------------------------------  RADIOLOGY:  No results found.  EKG:   Orders placed or performed during the hospital encounter of 09/01/17  . ED EKG  . ED EKG  . EKG 12-Lead  . EKG 12-Lead    ASSESSMENT AND PLAN:   67 year old female with family history of gastric bypass surgery, chronic anemia, CAD, CK D, diabetes, hypothyroidism, history of gastric bypass surgery [facility] secondary to low hemoglobin and weakness.  1.  Acute on chronic anemia- patient did have a bypass surgery several years ago. Not compliant with her oral iron supplements at home. -Labs with low ferritin at 4. Recent EGD and colonoscopy  in 2016 normal.o need to repeat colonoscopy at this time. Repeat EGD as recommended by GI. -Appreciate GI consult. Continue IV Protonix. -IV iron for 3 days. - avoid NSAIDS - If EGD normal- outpatient follow-up with hematology for IV iron and also consider bone marrow biopsy next line-normal B12 and folate levels  2. Hypertension-blood pressure is soft. Continue Norvasc. Discontinue clonidine, hydralazine and benazepril  3. Hypothyroidism-continue Synthroid  4. GERD-Protonix  5. DM- amaryl and SSI  6.  DVT prophylaxis- TEDs and SCDs   All the records are reviewed and case discussed with Care Management/Social Workerr. Management plans discussed with the patient, family and they are in agreement.  CODE STATUS: full code  TOTAL TIME TAKING CARE OF THIS PATIENT: 38 minutes.   POSSIBLE D/C IN 1-2 DAYS, DEPENDING ON CLINICAL CONDITION.   Gladstone Lighter M.D on 09/02/2017 at 2:01 PM  Between 7am to 6pm - Pager - [phone]  After 6pm go to [email or web address] - password [email or web address]  Office  [phone]  CC: Primary care physician; Glendon Axe, MD

## 2017-09-02 NOTE — Anesthesia Post-op Follow-up Note (Signed)
Anesthesia QCDR form completed.        

## 2017-09-02 NOTE — Progress Notes (Signed)
As the NT took the patient's BG it was 62 so she contacted me and I got the D50 25 ml as a standing order for NPO status patients. The pt was nonsymptomatic. I rechecked the BG at 15 minutes and it was 104. I will continue to monitor the pt.

## 2017-09-02 NOTE — Consult Note (Signed)
[name], MD [address], [place], [place], [place], [address] [address], [place], [place], [place], [address] Phone: [phone]  Fax: [phone]  Consultation  Referring Provider:     Dr. Tressia Miners Primary Care Physician:  Glendon Axe, MD Primary Gastroenterologist: [facility]    Reason for Consultation:     Anemia  Date of Admission:  09/01/2017 Date of Consultation:  09/02/2017         HPI:   [name] is a 67 y.o. female with history of gastric bypass surgery years ago, sent from primary care physician's office due to low hemoglobin, and generalized weakness.  Patient has history of chronic loose stools since her gastric bypass surgery.  She complains of abdominal pain, diffuse, cramping, 2/10, intermittent, related to meals, lasting 30 to 60 minutes, not associated with any nausea or vomiting.  No hematemesis.  Also reports to 3 loose stools daily.  Reports dyspnea on exertion for 6 months.  Hemoglobin was noted to be 7.9, with baseline of 9.3, 3 years ago.  MCV is low at 73.6.  Patient is on oral iron replacement at home but states she does not take it.  Ferritin is low at 4, and was 4, four years ago as well.  Last EGD and colonoscopy were in July 2016 by Dr. Candace Cruise of [facility], and it was done for iron deficiency anemia at that time as well.  Colonoscopy was normal.  EGD showed possible esophagitis, which was biopsied.  Gastric bypass anatomy was noted.  Exam was otherwise normal.  Biopsy showed intestinal metaplasia in the esophagus biopsies.  There are notes in her chart, from 2015 when she saw [facility], and they recommended IV iron, but she was unable to get this because of her ?insurance.  Past Medical History:  Diagnosis Date  . Anemia   . Anemia, iron deficiency 11/07/2013  . Arthritis   . CAD (coronary artery disease)   . Cerebrovascular accident ([place])   . Chicken pox   . Chronic kidney disease   . Colitis    ? at 35  .  Diabetes mellitus   . Hashimoto thyroiditis    w subsequent hypothyroidism  . Headache   . HTN (hypertension)   . Hyperlipidemia   . Hypothyroidism   . Myocardial infarction ([place])   . 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 Sleep apnea     Past Surgical History:  Procedure Laterality Date  . ABDOMINAL HYSTERECTOMY    . CHOLECYSTECTOMY    . COLONOSCOPY WITH PROPOFOL N/A 12/24/2014   Procedure: COLONOSCOPY WITH PROPOFOL;  Surgeon: Hulen Luster, MD;  Location: [facility] ENDOSCOPY;  Service: Gastroenterology;  Laterality: N/A;  . CORONARY ARTERY BYPASS GRAFT    . ESOPHAGOGASTRODUODENOSCOPY (EGD) WITH PROPOFOL N/A 12/24/2014   Procedure: ESOPHAGOGASTRODUODENOSCOPY (EGD) WITH PROPOFOL;  Surgeon: Hulen Luster, MD;  Location: [facility] ENDOSCOPY;  Service: Gastroenterology;  Laterality: N/A;  . JOINT REPLACEMENT    . ROUX-EN-Y GASTRIC BYPASS     loss over 100 pounds  . TONSILLECTOMY    . TOTAL KNEE ARTHROPLASTY Left     Prior to Admission medications   Medication Sig Start Date End Date Taking? Authorizing Provider  amLODipine (NORVASC) 10 MG tablet Take 1 tablet (10 mg total) by mouth daily. Patient taking differently: Take 5 mg by mouth daily.  08/21/13  Yes Susy Frizzle, MD  benazepril (LOTENSIN) 40 MG tablet TAKE ONE TABLET BY MOUTH ONCE  DAILY 08/24/14  Yes Susy Frizzle, MD  Cholecalciferol (VITAMIN D3) 1000 UNITS CAPS Take 1,000 Units by mouth daily.    Yes [provider]  cloNIDine (CATAPRES) 0.1 MG tablet TAKE ONE TABLET BY MOUTH TWICE DAILY (PATIENT  NEEDS  OFFICE  VISIT  BEFORE  ANY  FURTHER  REFILLS  CAN  BE  GIVEN) 07/24/14  Yes Susy Frizzle, MD  Coenzyme Q10 (COQ10) 100 MG CAPS Take 1 capsule by mouth daily.    Yes [provider]  furosemide (LASIX) 40 MG tablet Take 40 mg by mouth daily as needed for edema.    Yes [provider]  glimepiride (AMARYL) 1 MG tablet Take 1 tablet by mouth daily. 06/15/17  Yes [provider]  hydrALAZINE (APRESOLINE) 25 MG tablet Take 1 tablet (25 mg total) by mouth 3 (three) times daily. Patient taking differently: Take 50 mg by mouth 3 (three) times daily.  08/09/13  Yes Susy Frizzle, MD  levothyroxine (SYNTHROID, LEVOTHROID) 100 MCG tablet Take 1 tablet (100 mcg total) by mouth daily. Patient taking differently: Take 150 mcg by mouth daily.  08/21/13  Yes Susy Frizzle, MD  lovastatin (MEVACOR) 20 MG tablet Take 2 tablets (40 mg total) by mouth at bedtime. 02/06/14  Yes Susy Frizzle, MD  Multiple Vitamins-Minerals (MULTIVITAMIN WITH MINERALS) tablet Take 1 tablet by mouth daily.   Yes [provider]  Omega-3 Fatty Acids (FISH OIL) 1200 MG CAPS Take 1 capsule by mouth daily.    Yes [provider]  vitamin B-12 (CYANOCOBALAMIN) 1000 MCG tablet Take 1,000 mcg by mouth daily.   Yes [provider]    Family History  Problem Relation Age of Onset  . Coronary artery disease Other   . Stroke Maternal Grandmother   . Diabetes Paternal Grandmother        diabetic coma  . Heart attack Father   . Heart attack Mother   . Heart attack Maternal Grandfather   . Heart attack Paternal Grandfather      Social History   Tobacco Use  . Smoking status: Never Smoker  . Smokeless tobacco: Never Used  Substance Use Topics  . Alcohol use: No  . Drug use: No    Allergies as of 09/01/2017 - Review Complete 09/01/2017  Allergen Reaction Noted  . Cardura [doxazosin mesylate] Other (See Comments) 08/31/2013  . Morphine and related Nausea And Vomiting 01/25/2013  . Ultram [tramadol hcl]  12/21/2014  . Baclofen Other (See Comments) 09/01/2017    Review of Systems:    All systems reviewed and negative except where noted in HPI.   Physical Exam:  Vital signs in last 24 hours: Vitals:   09/01/17 2137 09/02/17 0607 09/02/17 0609 09/02/17 0611  BP: (!) 159/61 140/60 (!) 151/61 (!) 156/56  Pulse: 73 (!) 58 64 68  Resp:  20    Temp:  98.3 F (36.8 C) 98 F (36.7 C)    TempSrc: Oral Oral    SpO2: 94% 95%    Weight:      Height:       Last BM Date: 09/01/17 General:   Pleasant, cooperative in NAD Head:  Normocephalic and atraumatic. Eyes:   No icterus.   Conjunctiva pink. PERRLA. Ears:  Normal auditory acuity. Neck:  Supple; no masses or thyroidomegaly Lungs: Respirations even and unlabored. Lungs clear to auscultation bilaterally.   No wheezes, crackles, or rhonchi.  Abdomen:  Soft, nondistended, nontender. Normal bowel sounds. No appreciable  masses or hepatomegaly.  No rebound or guarding.  Neurologic:  Alert and oriented x3;  grossly normal neurologically. Skin:  Intact without significant lesions or rashes. Cervical Nodes:  No significant cervical adenopathy. Psych:  Alert and cooperative. Normal affect.  LAB RESULTS: Recent Labs    09/01/17 1301 09/02/17 0602  WBC 5.4 5.0  HGB 8.2* 7.9*  HCT 27.0* 26.4*  PLT 357 293   BMET Recent Labs    09/01/17 1301  NA 141  K 4.7  CL 110  CO2 21*  GLUCOSE 94  BUN 18  CREATININE 1.67*  CALCIUM 8.4*   LFT Recent Labs    09/01/17 1301  PROT 6.6  ALBUMIN 3.6  AST 16  ALT 14  ALKPHOS 88  BILITOT 0.6   PT/INR No results for input(s): LABPROT, INR in the last 72 hours.  STUDIES: No results found.    Impression / Plan:   [name] is a 67 y.o. y/o female with history of gastric bypass years ago, chronic iron deficiency anemia, admitted with dyspnea on exertion, generalized weakness, and microcytic anemia, no signs of active GI bleeding and not on any iron replacement at home (does not take the oral as prescribed)  Iron deficiency is likely related to her gastric bypass, and her not taking appropriate iron replacement No signs of active GI bleeding present However, due to acute on chronic anemia, will proceed with upper endoscopy today Her colonoscopy was within the last 3 yrs and was done for iron deficiency and hemoccult positive as stated  in HPI and repeat recommended in 10 yrs. There is no hematochezia and there is unlikely to be a new colonic malignancy this soon after a recent colonoscopy. She does not need a colonoscopy at this time unless clinical symptoms and signs change. If anemia does not respond to iron replacement colonoscopy can be considered as an outpatient.   Since ferritin is very low, would recommend starting IV iron transfusion at this time Would also recommend hematology consult as an inpatient given severe iron deficiency.  She should follow up with them closely as an outpatient as well Follow up with GI clinic in 2-3 weeks after discharge as well.  Continue Protonix IV twice daily until further recommendations after endoscopy Continue serial CBCs and transfuse PRN Avoid NSAIDs  Thank you for involving me in the care of this patient.      LOS: 0 days   Virgel Manifold, MD  09/02/2017, 9:56 AM

## 2017-09-02 NOTE — Anesthesia Procedure Notes (Signed)
Performed by: Joia Doyle, CRNA Pre-anesthesia Checklist: Patient identified, Emergency Drugs available, Suction available, Patient being monitored and Timeout performed Patient Re-evaluated:Patient Re-evaluated prior to induction Oxygen Delivery Method: Nasal cannula Induction Type: IV induction       

## 2017-09-02 NOTE — Op Note (Addendum)
[facility] Gastroenterology Patient Name: [name] Procedure Date: 09/02/2017 4:36 PM MRN: [id number] Account #: [id number] Date of Birth: 02/14/1951 Admit Type: Inpatient Age: 67 Room: [facility] ENDO ROOM 4 Gender: Female Note Status: Finalized Procedure:            Upper GI endoscopy Indications:          Iron deficiency anemia, Patient has history of Reux-en                        Y gastric Bypass in 2009 Providers:            Chrisann Melaragno B. Bonna Gains MD, MD Medicines:            Monitored Anesthesia Care Complications:        No immediate complications. Procedure:            Pre-Anesthesia Assessment:                       - The risks and benefits of the procedure and the                        sedation options and risks were discussed with the                        patient. All questions were answered and informed                        consent was obtained.                       - Patient identification and proposed procedure were                        verified prior to the procedure.                       - ASA Grade Assessment: III - A patient with severe                        systemic disease.                       After obtaining informed consent, the endoscope was                        passed under direct vision. Throughout the procedure,                        the patient's blood pressure, pulse, and oxygen                        saturations were monitored continuously. The Endoscope                        was introduced through the mouth, and advanced to the                        jejunum. The upper GI endoscopy was accomplished with                        ease. The patient tolerated the procedure well.  Findings:      One tongue of salmon-colored mucosa was present from 39 to 40 cm. No       other visible abnormalities were present. The maximum longitudinal       extent of these esophageal mucosal changes was 1 cm in length. Biopsies       were  taken with a cold forceps for histology.      One non-bleeding superficial gastric ulcer with no stigmata of bleeding       was found at the anastomosis. The lesion was 5 mm in largest dimension.       Biopsies were taken with a cold forceps for histology. Heaped edges were       seen around the ulcer and thus biopsies were done at the edges of the       ulcer.      The gastric pouch extended from 40 cm at the [place] to 51 cm at the       anastomosis. The Reux limb was intubated and was normal. The blind small       bowel limb was normal as well.      The examined jejunum was normal. Impression:           - Salmon-colored mucosa secondary to established                        Barrett's disease. Biopsied.                       - Non-bleeding gastric ulcer with no stigmata of                        bleeding. Biopsied. This ulcer is not acutely bleeding,                        and did not have stigmata of recent bleeding, but is                        likely contributing to her iron deficiency in addition                        to iron deficiency due to her gastric bypass (since she                        is not taking iron replacement)                       - The gastric pouch extended from 40 cm at the [place] to 51 cm at the anastomosis. The Reux limb was                        intubated and was normal. The blind small bowel limb                        was normal as well.                       - Normal examined jejunum. Recommendation:       - Await pathology results.                       -  Continue Serial CBCs and transfuse PRN                       - Avoid NSAIDs except Aspirin if medically indicated                       - Use sucralfate suspension 1 gram PO QID for 4 weeks.                       - Use Protonix (pantoprazole) 20 mg PO BID for 4 weeks.                       - Return to GI clinic in 2 weeks.                       - Return to primary care  physician as previously                        scheduled.                       - Return patient to hospital ward for ongoing care.                       - Advance diet as tolerated.                       - IV Iron and hematology consult for severe iron                        deficiency Procedure Code(s):    --- Professional ---                       [phone], Esophagogastroduodenoscopy, flexible, transoral;                        with biopsy, single or multiple Diagnosis Code(s):    --- Professional ---                       K22.70, Barrett's esophagus without dysplasia                       K25.9, Gastric ulcer, unspecified as acute or chronic,                        without hemorrhage or perforation                       D50.9, Iron deficiency anemia, unspecified CPT copyright 2016 American Medical Association. All rights reserved. The codes documented in this report are preliminary and upon coder review may  be revised to meet current compliance requirements.  [name], MD [name] B. Bonna Gains MD, MD 09/02/2017 5:06:21 PM This report has been signed electronically. Number of Addenda: 0 Note Initiated On: 09/02/2017 4:36 PM      [facility]

## 2017-09-02 NOTE — Care Management Obs Status (Signed)
[facility] NOTIFICATION   Patient Details  Name: [name] MRN: [id number] Date of Birth: [birth year]   Medicare Observation Status Notification Given:  Yes    Katrina Stack, RN 09/02/2017, 9:43 AM

## 2017-09-02 NOTE — Anesthesia Postprocedure Evaluation (Signed)
Anesthesia Post Note  Patient: [name]  Procedure(s) Performed: ESOPHAGOGASTRODUODENOSCOPY (EGD) (N/A )  Patient location during evaluation: Endoscopy Anesthesia Type: General Level of consciousness: awake and alert Pain management: pain level controlled Vital Signs Assessment: post-procedure vital signs reviewed and stable Respiratory status: spontaneous breathing, nonlabored ventilation, respiratory function stable and patient connected to nasal cannula oxygen Cardiovascular status: blood pressure returned to baseline and stable Postop Assessment: no apparent nausea or vomiting Anesthetic complications: no     Last Vitals:  Vitals:   09/02/17 1701 09/02/17 1728  BP: (!) 120/59 (!) 132/52  Pulse: 63   Resp: 20   Temp:    SpO2: 99%     Last Pain:  Vitals:   09/02/17 1658  TempSrc: Tympanic  PainSc:                  [name] [name]

## 2017-09-02 NOTE — Transfer of Care (Signed)
Immediate Anesthesia Transfer of Care Note  Patient: [name]  Procedure(s) Performed: ESOPHAGOGASTRODUODENOSCOPY (EGD) (N/A )  Patient Location: PACU  Anesthesia Type:General  Level of Consciousness: awake and responds to stimulation  Airway & Oxygen Therapy: Patient Spontanous Breathing and Patient connected to nasal cannula oxygen  Post-op Assessment: Report given to RN and Post -op Vital signs reviewed and stable  Post vital signs: Reviewed and stable  Last Vitals:  Vitals Value Taken Time  BP 120/59 09/02/2017  5:01 PM  Temp 36.1 C 09/02/2017  4:58 PM  Pulse 63 09/02/2017  5:01 PM  Resp 20 09/02/2017  5:01 PM  SpO2 99 % 09/02/2017  5:01 PM    Last Pain:  Vitals:   09/02/17 1658  TempSrc: Tympanic  PainSc:          Complications: No apparent anesthesia complications

## 2017-09-03 LAB — GLUCOSE, CAPILLARY
GLUCOSE-CAPILLARY: 78 mg/dL (ref 65–99)
Glucose-Capillary: 157 mg/dL — ABNORMAL HIGH (ref 65–99)

## 2017-09-03 LAB — CBC
HCT: 26.9 % — ABNORMAL LOW (ref 35.0–47.0)
HEMOGLOBIN: 8 g/dL — AB (ref 12.0–16.0)
MCH: 22 pg — ABNORMAL LOW (ref 26.0–34.0)
MCHC: 29.9 g/dL — ABNORMAL LOW (ref 32.0–36.0)
MCV: 73.8 fL — ABNORMAL LOW (ref 80.0–100.0)
PLATELETS: 285 10*3/uL (ref 150–440)
RBC: 3.65 MIL/uL — AB (ref 3.80–5.20)
RDW: 19 % — ABNORMAL HIGH (ref 11.5–14.5)
WBC: 5.2 10*3/uL (ref 3.6–11.0)

## 2017-09-03 MED ORDER — PANTOPRAZOLE SODIUM 20 MG PO TBEC: 20.0000 mg | DELAYED_RELEASE_TABLET | Freq: Two times a day (BID) | ORAL | 2 refills | Status: DC

## 2017-09-03 MED ORDER — AMLODIPINE BESYLATE 10 MG PO TABS: 5.0000 mg | ORAL_TABLET | Freq: Every day | ORAL | 2 refills | Status: DC

## 2017-09-03 MED ORDER — FERROUS SULFATE 325 (65 FE) MG PO TABS: 325.0000 mg | ORAL_TABLET | Freq: Two times a day (BID) | ORAL | 1 refills | Status: DC

## 2017-09-03 MED ORDER — SUCRALFATE 1 G PO TABS: 1.0000 g | ORAL_TABLET | Freq: Four times a day (QID) | ORAL | 1 refills | Status: DC

## 2017-09-03 NOTE — Progress Notes (Signed)
She was sent out via wheelchair to her waiting ride

## 2017-09-03 NOTE — Progress Notes (Signed)
     [name] was admitted to the [facility] on 09/01/2017 for an acute medical condition and is being Discharged on  09/03/2017 .  She will need another 3  days for recovery and so advised to stay away from work until then. So please excuse  her from work for the above Days. Should be able to return to work  without any restrictions from  09/06/17.  Call Gladstone Lighter  MD, [facility] at  [phone] with questions.  Gladstone Lighter M.D on 09/03/2017,at 11:24 AM  [facility] [address], [place] [address]

## 2017-09-03 NOTE — Discharge Summary (Signed)
[facility] [place] NAME: [name]    MR#:  [id number]  DATE OF BIRTH:  1951-03-12  DATE OF ADMISSION:  09/01/2017   ADMITTING PHYSICIAN: Gorden Harms, MD  DATE OF DISCHARGE: 09/03/17  PRIMARY CARE PHYSICIAN: Glendon Axe, MD   ADMISSION DIAGNOSIS:   Dyspnea on exertion [R06.09] Other iron deficiency anemia [D50.8]  DISCHARGE DIAGNOSIS:   Active Problems:   Anemia   Lesion of esophagus   Acute gastric ulcer without hemorrhage or perforation   SECONDARY DIAGNOSIS:   Past Medical History:  Diagnosis Date  . Anemia   . Anemia, iron deficiency 11/07/2013  . Arthritis   . CAD (coronary artery disease)   . Cerebrovascular accident ([place])   . Chicken pox   . Chronic kidney disease   . Colitis    ? at 81  . Diabetes mellitus   . Hashimoto thyroiditis    w subsequent hypothyroidism  . Headache   . HTN (hypertension)   . Hyperlipidemia   . Hypothyroidism   . Myocardial infarction ([place])   . 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 Sleep apnea     HOSPITAL COURSE:   67 year old female with family history of gastric bypass surgery, chronic anemia, CAD, CKD, diabetes, hypothyroidism, history of gastric [facility] secondary to low hemoglobin and weakness.  1. Acute on chronic anemia- patient did have a bypass surgery several years ago. Not compliant with her oral iron supplements at home. -Labs with low ferritin at 4. Recent EGD and colonoscopy in 2016 normal.no need to repeat colonoscopy at this time.  -Repeat EGD showing a small non bleeding gastric ulcer with raised edges- s/p biopsy done - discontinue aspirin, avoid NSAIDS - sucralfate QID and protonix BID -Appreciate GI consult. - received IV iron in the hospital. discharge on iron supplements - outpatient follow-up with hematology for IV iron and also consider bone marrow biopsy if needed -normal B12 and  folate levels  2. Hypertension-blood pressure is improving. Continue Norvasc, benazepril and oral hydralazine. - Discontinue clonidine at discharge  3. Hypothyroidism-continue Synthroid  4. GERD-Protonix  5. DM- amaryl   Will be discharged home today    DISCHARGE CONDITIONS:   Guarded  CONSULTS OBTAINED:   Treatment Team:  Virgel Manifold, MD  DRUG ALLERGIES:   Allergies  Allergen Reactions  . Ultram [Tramadol Hcl]   . Baclofen Other (See Comments)    Dizziness  . Cardura [Doxazosin Mesylate] Other (See Comments)    Dizzy with passing out spells  . Morphine And Related Nausea And Vomiting   DISCHARGE MEDICATIONS:   Allergies as of 09/03/2017      Reactions   Ultram [tramadol Hcl]    Baclofen Other (See Comments)   Dizziness   Cardura [doxazosin Mesylate] Other (See Comments)   Dizzy with passing out spells   Morphine And Related Nausea And Vomiting      Medication List    STOP taking these medications   cloNIDine 0.1 MG tablet Commonly known as:  CATAPRES   furosemide 40 MG tablet Commonly known as:  LASIX     TAKE these medications   amLODipine 10 MG tablet Commonly known as:  NORVASC Take 0.5 tablets (5 mg total) by mouth daily.   benazepril 40 MG tablet Commonly known as:  LOTENSIN TAKE ONE TABLET BY MOUTH ONCE DAILY   CoQ10 100 MG Caps Take 1 capsule by mouth daily.   ferrous  sulfate 325 (65 FE) MG tablet Take 1 tablet (325 mg total) by mouth 2 (two) times daily with a meal.   Fish Oil 1200 MG Caps Take 1 capsule by mouth daily.   glimepiride 1 MG tablet Commonly known as:  AMARYL Take 1 tablet by mouth daily.   hydrALAZINE 25 MG tablet Commonly known as:  APRESOLINE Take 1 tablet (25 mg total) by mouth 3 (three) times daily. What changed:  how much to take   levothyroxine 100 MCG tablet Commonly known as:  SYNTHROID, LEVOTHROID Take 1 tablet (100 mcg total) by mouth daily. What changed:  how much to take     lovastatin 20 MG tablet Commonly known as:  MEVACOR Take 2 tablets (40 mg total) by mouth at bedtime.   multivitamin with minerals tablet Take 1 tablet by mouth daily.   pantoprazole 20 MG tablet Commonly known as:  PROTONIX Take 1 tablet (20 mg total) by mouth 2 (two) times daily.   sucralfate 1 g tablet Commonly known as:  CARAFATE Take 1 tablet (1 g total) by mouth 4 (four) times daily. Dissolve in 11m water and  Drink the slurry prior to eating.   vitamin B-12 1000 MCG tablet Commonly known as:  CYANOCOBALAMIN Take 1,000 mcg by mouth daily.   Vitamin D3 1000 units Caps Take 1,000 Units by mouth daily.        DISCHARGE INSTRUCTIONS:   1. PCP f/u in 1 week 2. GI f/u in 2-3 weeks  DIET:   Cardiac diet  ACTIVITY:   Activity as tolerated  OXYGEN:   Home Oxygen: No.  Oxygen Delivery: room air  DISCHARGE LOCATION:   home   If you experience worsening of your admission symptoms, develop shortness of breath, life threatening emergency, suicidal or homicidal thoughts you must seek medical attention immediately by calling 911 or calling your MD immediately  if symptoms less severe.  You Must read complete instructions/literature along with all the possible adverse reactions/side effects for all the Medicines you take and that have been prescribed to you. Take any new Medicines after you have completely understood and accpet all the possible adverse reactions/side effects.   Please note  You were cared for by a hospitalist during your hospital stay. If you have any questions about your discharge medications or the care you received while you were in the hospital after you are discharged, you can call the unit and asked to speak with the hospitalist on call if the hospitalist that took care of you is not available. Once you are discharged, your primary care physician will handle any further medical issues. Please note that NO REFILLS for any discharge medications will  be authorized once you are discharged, as it is imperative that you return to your primary care physician (or establish a relationship with a primary care physician if you do not have one) for your aftercare needs so that they can reassess your need for medications and monitor your lab values.    On the day of Discharge:  VITAL SIGNS:   Blood pressure (!) 161/59, pulse 72, temperature 98.5 F (36.9 C), temperature source Oral, resp. rate 16, height _0  (1.626 m), weight 99.1 kg (218 lb 7.6 oz), SpO2 98 %.  PHYSICAL EXAMINATION:    GENERAL:  67y.o.-year-old patient lying in the bed with no acute distress.  EYES: Pupils equal, round, reactive to light and accommodation. No scleral icterus. Extraocular muscles intact.  HEENT: Head atraumatic, normocephalic. Oropharynx and nasopharynx  clear.  NECK:  Supple, no jugular venous distention. No thyroid enlargement, no tenderness.  LUNGS: Normal breath sounds bilaterally, no wheezing, rales,rhonchi or crepitation. No use of accessory muscles of respiration.  CARDIOVASCULAR: S1, S2 normal. No murmurs, rubs, or gallops.  ABDOMEN: Soft, nontender, nondistended. Bowel sounds present. No organomegaly or mass.  EXTREMITIES: No pedal edema, cyanosis, or clubbing.  NEUROLOGIC: Cranial nerves II through XII are intact. Muscle strength 5/5 in all extremities. Sensation intact. Gait not checked.  PSYCHIATRIC: The patient is alert and oriented x 3.  SKIN: No obvious rash, lesion, or ulcer.     DATA REVIEW:   CBC Recent Labs  Lab 09/03/17 0527  WBC 5.2  HGB 8.0*  HCT 26.9*  PLT 285    Chemistries  Recent Labs  Lab 09/01/17 1301 09/02/17 0910  NA 141  --   K 4.7  --   CL 110  --   CO2 21*  --   GLUCOSE 94 97  BUN 18  --   CREATININE 1.67*  --   CALCIUM 8.4*  --   AST 16  --   ALT 14  --   ALKPHOS 88  --   BILITOT 0.6  --      Microbiology Results  Results for orders placed or performed in visit on 08/21/13  Fecal occult blood,  imunochemical     Status: Abnormal   Collection Time: 08/25/13  2:39 PM  Result Value Ref Range Status   Fecal Occult Blood POS (A) Negative Final  Fecal occult blood, imunochemical     Status: Abnormal   Collection Time: 08/25/13  2:39 PM  Result Value Ref Range Status   Fecal Occult Blood POS (A) Negative Final  Fecal occult blood, imunochemical     Status: Abnormal   Collection Time: 08/25/13  2:39 PM  Result Value Ref Range Status   Fecal Occult Blood POS (A) Negative Final    RADIOLOGY:  No results found.   Management plans discussed with the patient, family and they are in agreement.  CODE STATUS:     Code Status Orders  (From admission, onward)        Start     Ordered   09/01/17 1755  Full code  Continuous     09/01/17 1755    Code Status History    This patient has a current code status but no historical code status.      TOTAL TIME TAKING CARE OF THIS PATIENT: 38 minutes.    Gladstone Lighter M.D on 09/03/2017 at 11:20 AM  Between 7am to 6pm - Pager - [phone]  After 6pm go to [email or web address] - Proofreader  [facility]  Office  [phone]  CC: Primary care physician; Glendon Axe, MD   Note: This dictation was prepared with Dragon dictation along with smaller phrase technology. Any transcriptional errors that result from this process are unintentional.

## 2017-09-03 NOTE — Progress Notes (Signed)
Discharge instructions reviewed with patient. Patient verbalized understanding. Patient provided prescriptions to take to pharmacy and given work note. No IV noted. Patient states that she is going to eat lunch, get dressed and her transportation home with be her at 2pm. Patient made aware to ring the call bell when she is ready to be escorted to entrance to meet her ride.

## 2017-09-04 LAB — TYPE AND SCREEN
ABO/RH(D): B POS
ANTIBODY SCREEN: POSITIVE
PT AG Type: NEGATIVE
UNIT DIVISION: 0
Unit division: 0

## 2017-09-04 LAB — BPAM RBC
BLOOD PRODUCT EXPIRATION DATE: 201904082359
BLOOD PRODUCT EXPIRATION DATE: 201904112359
Unit Type and Rh: 5100
Unit Type and Rh: 5100

## 2017-09-06 ENCOUNTER — Encounter: Payer: Self-pay | Admitting: Gastroenterology

## 2017-09-06 LAB — SURGICAL PATHOLOGY

## 2017-09-07 ENCOUNTER — Telehealth: Payer: Self-pay | Admitting: Gastroenterology

## 2017-09-07 NOTE — Telephone Encounter (Signed)
LVM for patient to call and reschedule her appt. Dr. Bonna Gains will be on call 3 week ED follow up anemia

## 2017-09-08 ENCOUNTER — Telehealth: Payer: Self-pay | Admitting: Gastroenterology

## 2017-09-08 NOTE — Telephone Encounter (Signed)
LVM for patient to call and reschedule appt with Dr. Bonna Gains 3 week follow up ED Anemia

## 2017-09-09 ENCOUNTER — Encounter: Payer: Self-pay | Admitting: Gastroenterology

## 2017-09-09 NOTE — Telephone Encounter (Signed)
LVM for patient to call and reschedule her ED follow up appt with Dr. Bonna Gains.  Letter sent

## 2017-09-20 ENCOUNTER — Ambulatory Visit: Payer: Medicare Other | Admitting: Gastroenterology

## 2017-09-28 DIAGNOSIS — R600 Localized edema: Secondary | ICD-10-CM | POA: Insufficient documentation

## 2017-09-28 DIAGNOSIS — E782 Mixed hyperlipidemia: Secondary | ICD-10-CM | POA: Insufficient documentation

## 2017-10-26 ENCOUNTER — Other Ambulatory Visit: Payer: Self-pay

## 2017-10-26 ENCOUNTER — Telehealth: Payer: Self-pay

## 2017-10-26 NOTE — Telephone Encounter (Signed)
LVM for patient to callback for information from Dr. Bonna Gains.  Contacted [facility]: pt's last appointment was in April = no show.   Mailed patient a letter with the following information from Dr. [name].   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 - follow up with our GI clinic, or [facility] whichever she prefers.  However, she should follow-up with [place] or them closely.  - She should make this appointment within the next 3-4 weeks. She also needs to be set up for IV iron transfusions, and she can be referred for this by her primary care provider, or her GI physician. -She may need a repeat EGD in the near future, and it can be done with whichever GI she decides to follow-up with.  Note: recent labs show iron saturations above normal.

## 2017-11-15 ENCOUNTER — Other Ambulatory Visit: Payer: Self-pay

## 2017-11-15 ENCOUNTER — Emergency Department
Admission: EM | Admit: 2017-11-15 | Discharge: 2017-11-15 | Disposition: A | Discharge status: Home / Self Care | Payer: Medicare Other | Attending: Student in an Organized Health Care Education/Training Program | Admitting: Student in an Organized Health Care Education/Training Program

## 2017-11-15 ENCOUNTER — Emergency Department: Payer: Medicare Other

## 2017-11-15 DIAGNOSIS — Z79899 Other long term (current) drug therapy: Secondary | ICD-10-CM | POA: Insufficient documentation

## 2017-11-15 DIAGNOSIS — Z9884 Bariatric surgery status: Secondary | ICD-10-CM | POA: Diagnosis not present

## 2017-11-15 DIAGNOSIS — I2510 Atherosclerotic heart disease of native coronary artery without angina pectoris: Secondary | ICD-10-CM | POA: Insufficient documentation

## 2017-11-15 DIAGNOSIS — I252 Old myocardial infarction: Secondary | ICD-10-CM | POA: Insufficient documentation

## 2017-11-15 DIAGNOSIS — Z7984 Long term (current) use of oral hypoglycemic drugs: Secondary | ICD-10-CM | POA: Diagnosis not present

## 2017-11-15 DIAGNOSIS — Z96652 Presence of left artificial knee joint: Secondary | ICD-10-CM | POA: Insufficient documentation

## 2017-11-15 DIAGNOSIS — E871 Hypo-osmolality and hyponatremia: Secondary | ICD-10-CM | POA: Insufficient documentation

## 2017-11-15 DIAGNOSIS — Z9049 Acquired absence of other specified parts of digestive tract: Secondary | ICD-10-CM | POA: Diagnosis not present

## 2017-11-15 DIAGNOSIS — R42 Dizziness and giddiness: Secondary | ICD-10-CM | POA: Insufficient documentation

## 2017-11-15 DIAGNOSIS — I129 Hypertensive chronic kidney disease with stage 1 through stage 4 chronic kidney disease, or unspecified chronic kidney disease: Secondary | ICD-10-CM | POA: Insufficient documentation

## 2017-11-15 DIAGNOSIS — N183 Chronic kidney disease, stage 3 (moderate): Secondary | ICD-10-CM | POA: Diagnosis not present

## 2017-11-15 DIAGNOSIS — E039 Hypothyroidism, unspecified: Secondary | ICD-10-CM | POA: Insufficient documentation

## 2017-11-15 DIAGNOSIS — Z8673 Personal history of transient ischemic attack (TIA), and cerebral infarction without residual deficits: Secondary | ICD-10-CM | POA: Diagnosis not present

## 2017-11-15 DIAGNOSIS — E1122 Type 2 diabetes mellitus with diabetic chronic kidney disease: Secondary | ICD-10-CM | POA: Insufficient documentation

## 2017-11-15 DIAGNOSIS — H5461 Unqualified visual loss, right eye, normal vision left eye: Secondary | ICD-10-CM

## 2017-11-15 DIAGNOSIS — H538 Other visual disturbances: Secondary | ICD-10-CM | POA: Diagnosis present

## 2017-11-15 DIAGNOSIS — H53132 Sudden visual loss, left eye: Secondary | ICD-10-CM | POA: Insufficient documentation

## 2017-11-15 LAB — COMPREHENSIVE METABOLIC PANEL
ALK PHOS: 92 U/L (ref 38–126)
ALT: 14 U/L (ref 14–54)
AST: 18 U/L (ref 15–41)
Albumin: 3.5 g/dL (ref 3.5–5.0)
Anion gap: 7 (ref 5–15)
BUN: 25 mg/dL — ABNORMAL HIGH (ref 6–20)
CALCIUM: 8.4 mg/dL — AB (ref 8.9–10.3)
CO2: 22 mmol/L (ref 22–32)
CREATININE: 1.6 mg/dL — AB (ref 0.44–1.00)
Chloride: 113 mmol/L — ABNORMAL HIGH (ref 101–111)
GFR, EST AFRICAN AMERICAN: 37 mL/min — AB (ref >60)
GFR, EST NON AFRICAN AMERICAN: 32 mL/min — AB (ref >60)
Glucose, Bld: 67 mg/dL (ref 65–99)
Potassium: 3.7 mmol/L (ref 3.5–5.1)
Sodium: 142 mmol/L (ref 135–145)
Total Bilirubin: 0.5 mg/dL (ref 0.3–1.2)
Total Protein: 6.3 g/dL — ABNORMAL LOW (ref 6.5–8.1)

## 2017-11-15 LAB — DIFFERENTIAL
Basophils Absolute: 0 10*3/uL (ref 0–0.1)
Basophils Relative: 1 %
Eosinophils Absolute: 0.2 10*3/uL (ref 0–0.7)
Eosinophils Relative: 4 %
LYMPHS PCT: 32 %
Lymphs Abs: 2.1 10*3/uL (ref 1.0–3.6)
MONO ABS: 0.7 10*3/uL (ref 0.2–0.9)
MONOS PCT: 11 %
NEUTROS ABS: 3.4 10*3/uL (ref 1.4–6.5)
Neutrophils Relative %: 52 %

## 2017-11-15 LAB — CBC
HEMATOCRIT: 33.3 % — AB (ref 35.0–47.0)
Hemoglobin: 11.1 g/dL — ABNORMAL LOW (ref 12.0–16.0)
MCH: 27.5 pg (ref 26.0–34.0)
MCHC: 33.4 g/dL (ref 32.0–36.0)
MCV: 82.4 fL (ref 80.0–100.0)
Platelets: 260 10*3/uL (ref 150–440)
RBC: 4.04 MIL/uL (ref 3.80–5.20)
RDW: 21.4 % — AB (ref 11.5–14.5)
WBC: 6.5 10*3/uL (ref 3.6–11.0)

## 2017-11-15 LAB — PROTIME-INR
INR: 1.09
PROTHROMBIN TIME: 14 s (ref 11.4–15.2)

## 2017-11-15 LAB — GLUCOSE, CAPILLARY: GLUCOSE-CAPILLARY: 98 mg/dL (ref 65–99)

## 2017-11-15 LAB — TROPONIN I
TROPONIN I: 0.03 ng/mL — AB (ref <0.03)
Troponin I: 0.03 ng/mL (ref <0.03)

## 2017-11-15 LAB — APTT: APTT: 31 s (ref 24–36)

## 2017-11-15 MED ORDER — MECLIZINE HCL 25 MG PO TABS
12.5000 mg | ORAL_TABLET | Freq: Once | ORAL | Status: AC
  Administered 2017-11-15: 12.5 mg via ORAL
  Filled 2017-11-15: qty 1

## 2017-11-15 MED ORDER — ASPIRIN 81 MG PO CHEW
324.0000 mg | CHEWABLE_TABLET | Freq: Once | ORAL | Status: AC
  Administered 2017-11-15: 324 mg via ORAL
  Filled 2017-11-15: qty 4

## 2017-11-15 NOTE — ED Notes (Signed)
Pt was taken to CT via stretcher and returned to room.

## 2017-11-15 NOTE — ED Notes (Addendum)
Patient transported back from MRI. Pt denies any pain at this time. Pt updated on plan of care. VSS. Pt family remains at side. Call bell within reach, will continue to monitor.

## 2017-11-15 NOTE — ED Notes (Signed)
Patient transported to MRI 

## 2017-11-15 NOTE — ED Notes (Signed)
Pt reports loss of vision to right eye starting at 1400 today while at work. Pt reports it lasted 15 minutes and vision returned. Pt then got up to walk and felt very dizzy. Pt reports HA. Pt denies CP or SOB. No N/V/D reported. Pt denies any falls recently. Pt unsure if she is on any blood thinners. Hx GI bleed in the past while on blood thinners. CAOx4

## 2017-11-15 NOTE — Discharge Instructions (Addendum)
Please start taking daily aspirin.  Return for worsening symptoms.  Follow-up with neurology as well as ophthalmology.  Return to the ER if you have any additional questions or concerns.

## 2017-11-15 NOTE — ED Triage Notes (Signed)
Pt arrives to ED with c/o of R sided vision loss for 15-20 minutes that began at 2p. States she was sitting at work and couldn't see the phone to answer it. Also c/o dizziness. Feels swimmy headed. Talking in complete sentences. Alert and oriented. Moving all extremities. No facial droop noted. Equal grips bilateral. States R side of face feels heavy but denies numbness and tingling.   Hx stroke. States she takes a blood thinner but doesn't know name. Took 2 tylenol when vision returned.

## 2017-11-15 NOTE — Progress Notes (Signed)
   11/15/17 1645  Clinical Encounter Type  Visited With Family  Visit Type Initial   Chaplain responded to Code Stroke and arrived as patient was being taken to CT.  Chaplain engaged husband of patient and offered support, then ended the conversation when nurses needed to speak to him about the patient's medical history.  Chaplain encouraged husband to have the nurse page on-call Chaplain if needed.

## 2017-11-15 NOTE — Consult Note (Signed)
TeleNeurology Consult Note  [name]     Consulting Physician: Clabe Seal Code Status: Prior Primary Care Physician:  Glendon Axe, MD  DOB:  24-Jan-1951   Age: 67 y.o.  Date of Service: November 15, 2017 Admit Date:  11/15/2017   Admitting Diagnosis: <principal problem not specified>  Subjective:  Reason for Consultation: stroke  Chief Complaint: vision problems  History of present illness: 67 y/o woman with h/o DM and HTN and hyperlipidemia who presents with transient right eye vision loss at 1400. Emergent  telestroke consult requested. CT head reviewed and case discussed with ED staff. Patient states this has occurred previously do an "blood vessel bursting" in her eye. Patient off ASA due to GI bleeding for the past 6 weeks and acute blood loss anemia. Patient previously required RBC transfusion. NIHSS 0 at this time.   Review of Systems  Patient Active Problem List   Diagnosis Date Noted  . Lesion of esophagus   . Acute gastric ulcer without hemorrhage or perforation   . Anemia 09/01/2017  . DOE (dyspnea on exertion) 09/01/2017  . Chronic insomnia 01/26/2017  . Postmenopausal 08/05/2016  . Low TSH level 01/12/2016  . Coronary artery disease with hx of myocardial infarct w/o hx of CABG 05/21/2015  . Chronic kidney disease, stage III (moderate) ([place]) 10/16/2014  . Acquired hypothyroidism 08/06/2014  . Type 2 diabetes mellitus without complication ([place]) 25/85/2778  . Anemia, iron deficiency 11/07/2013  . 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 Heme + stool 11/07/2013  . Feces contents abnormal 11/07/2013  . HYPERTENSION, BENIGN 11/10/2008  . CAD, ARTERY BYPASS GRAFT 11/10/2008   Past Medical History:  Diagnosis Date  . Anemia   . Anemia, iron deficiency 11/07/2013  . Arthritis   . CAD (coronary artery disease)   . Cerebrovascular accident ([place])   . Chicken pox   . Chronic kidney disease   . Colitis    ? at 52  . Diabetes  mellitus   . Hashimoto thyroiditis    w subsequent hypothyroidism  . Headache   . HTN (hypertension)   . Hyperlipidemia   . Hypothyroidism   . Myocardial infarction ([place])   . 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 Sleep apnea    Past Surgical History:  Procedure Laterality Date  . ABDOMINAL HYSTERECTOMY    . CHOLECYSTECTOMY    . COLONOSCOPY WITH PROPOFOL N/A 12/24/2014   Procedure: COLONOSCOPY WITH PROPOFOL;  Surgeon: Hulen Luster, MD;  Location: [facility] ENDOSCOPY;  Service: Gastroenterology;  Laterality: N/A;  . CORONARY ARTERY BYPASS GRAFT    . ESOPHAGOGASTRODUODENOSCOPY N/A 09/02/2017   Procedure: ESOPHAGOGASTRODUODENOSCOPY (EGD);  Surgeon: Virgel Manifold, MD;  Location: [facility] ENDOSCOPY;  Service: Endoscopy;  Laterality: N/A;  . ESOPHAGOGASTRODUODENOSCOPY (EGD) WITH PROPOFOL N/A 12/24/2014   Procedure: ESOPHAGOGASTRODUODENOSCOPY (EGD) WITH PROPOFOL;  Surgeon: Hulen Luster, MD;  Location: [facility] ENDOSCOPY;  Service: Gastroenterology;  Laterality: N/A;  . JOINT REPLACEMENT    . ROUX-EN-Y GASTRIC BYPASS     loss over 100 pounds  . TONSILLECTOMY    . TOTAL KNEE ARTHROPLASTY Left    Allergies  Allergen Reactions  . Ultram [Tramadol Hcl]   . Baclofen Other (See Comments)    Dizziness  . Cardura [Doxazosin Mesylate] Other (See Comments)    Dizzy with passing out spells  . Morphine And Related Nausea And Vomiting  . Other Rash    Gold metal    Social History   Socioeconomic  History  . Marital status: Single    Spouse name: Not on file  . Number of children: 0  . Years of education: Not on file  . Highest education level: Not on file  Occupational History  . Occupation: Research scientist (physical sciences): [employer]  . Financial resource strain: Not on file  . Food insecurity:    Worry: Not on file    Inability: Not on file  . Transportation needs:    Medical: Not on file    Non-medical: Not on file  Tobacco Use  . Smoking status: Never  Smoker  . Smokeless tobacco: Never Used  Substance and Sexual Activity  . Alcohol use: No  . Drug use: No  . Sexual activity: Not on file  Lifestyle  . Physical activity:    Days per week: Not on file    Minutes per session: Not on file  . Stress: Not on file  Relationships  . Social connections:    Talks on phone: Not on file    Gets together: Not on file    Attends religious service: Not on file    Active member of club or organization: Not on file    Attends meetings of clubs or organizations: Not on file    Relationship status: Not on file  . Intimate partner violence:    Fear of current or ex partner: Not on file    Emotionally abused: Not on file    Physically abused: Not on file    Forced sexual activity: Not on file  Other Topics Concern  . Not on file  Social History Narrative   Single, no children   Customer service   1 caffeine/day   Family History Family History  Problem Relation Age of Onset  . Coronary artery disease Other   . Stroke Maternal Grandmother   . Diabetes Paternal Grandmother        diabetic coma  . Heart attack Father   . Heart attack Mother   . Heart attack Maternal Grandfather   . Heart attack Paternal Grandfather       Objective:  Vital signs in last 24 hours: Temp:  [97.9 F (36.6 C)] 97.9 F (36.6 C) (06/10 1640) Pulse Rate:  [55] 55 (06/10 1640) Resp:  [18] 18 (06/10 1640) BP: (174)/(58) 174/58 (06/10 1640) SpO2:  [98 %] 98 % (06/10 1640) Weight:  [98.4 kg (217 lb)] 98.4 kg (217 lb) (06/10 1641) Temp (24hrs), Avg:97.9 F (36.6 C), Min:97.9 F (36.6 C), Max:97.9 F (36.6 C)    Intake/Output last 3 shifts: No intake/output data recorded.  Intake/Output this shift: No intake/output data recorded.  Physical Exam 1a- LOC: Keenly responsive - 0      1b- LOC questions: Answers both questions correctly - 0     1c- LOC commands- Performs both tasks correctly- 0     2- Gaze: Normal; no gaze paresis or gaze deviation - 0      3- Visual Fields: normal, no Visual field deficit - 0     4- Facial movements: no facial palsy - 0     5a- Right Upper limb motor - no drift -0     5b -Left Upper limb motor - no drift -0     6a- Right Lower limb motor - no drift - 0      6b- Left Lower limb motor - no drift - 0 7- Limb Coordination: absent ataxia - 0  8- Sensory : no sensory loss - 0      9- Language - No aphasia - 0      10- Speech - No dysarthria -0     11- Neglect / Extinction - none found -0     NIHSS score   0   Diagnostic Findings:  Pertinent Labs: No results for input(s): WBC, MCV in the last 168 hours.  Invalid input(s): HEMATOCRIT, HEMOGLOBIN, PLATELETS No results for input(s): POTASSIUM, CHLORIDE, CALCIUM, BUN, GLUCOSE, CREATININE, CO2 in the last 168 hours.  Invalid input(s): SODIUM, ANION GAP2, GFR MDRD AF AMER No results for input(s): AST, ALT, ALBUMIN in the last 168 hours.  Invalid input(s): ALK PHOS, BILIRUBIN TOTAL, BILIRUBIN DIRECT, PROTEIN TOTAL, GLOBULIN No results for input(s): TRIG in the last 168 hours.  Invalid input(s): CHLPL, HDL PML, VLDL CALC, LDL CALC, CHOL/HDL RATIO, LDL/HDL RATIO, NON-HDL CHOLESTEROL Invalid input(s): CK TOTAL, TROPONIN I, CK MB No results for input(s): APTT in the last 168 hours. No results for input(s): INR in the last 168 hours.  Invalid input(s): PROTHROMBIN TIME  Extensive review of previous medical records completed.    Assessment: Patient Active Problem List   Diagnosis Date Noted  . Lesion of esophagus   . Acute gastric ulcer without hemorrhage or perforation   . Anemia 09/01/2017  . DOE (dyspnea on exertion) 09/01/2017  . Chronic insomnia 01/26/2017  . Postmenopausal 08/05/2016  . Low TSH level 01/12/2016  . Coronary artery disease with hx of myocardial infarct w/o hx of CABG 05/21/2015  . Chronic kidney disease, stage III (moderate) ([place]) 10/16/2014  . Acquired hypothyroidism 08/06/2014  . Type 2 diabetes mellitus without complication  ([place]) 67/34/1937  . Anemia, iron deficiency 11/07/2013  . 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 Heme + stool 11/07/2013  . Feces contents abnormal 11/07/2013  . HYPERTENSION, BENIGN 11/10/2008  . CAD, ARTERY BYPASS GRAFT 11/10/2008     Plan: [facility] Tele[facility]urology Consult Services  Impression: TIA vs Amaurosis [place] vs Right eye problem     Differential Diagnosis:  1. Cardioembolic stroke  2. Small vessel disease/lacune    3. Thromboembolic, artery-to-artery mechanism 4. Hypercoagulable state-related infarct  5. Thrombotic mechanism, large artery disease 6. Transient ischemic attack  Comments: Last known well:   1400 Door time:1632 [facility] contacted:   [id number] [facility] at bedside:   1653 NIHSS assessment time: 1655 CT head reviewed at 1700 Needle time:TPA not considered since NIHSS 0 Thrombectomy not considered since large vessel occlusion is not suspected     Discussion:     Our recommendations are outlined below.  Recommendations: Ophthalmology consult ASA if ok by GI  IV fluids and permissive hypertension (Keep SBP < 220/110) Neurochecks DVT prophylaxis    Follow up with Neurology for further testing and evaluation  Medical Decision Making:  - Extensive number of diagnosis or management options are considered above. - Extensive amount of complex data reviewed.  - High risk of complication and/or morbidity or mortality are associated with differential diagnostic considerations above. - There may be uncertain outcome and increased probability of prolonged functional impairment or high probability of severe prolonged functional impairment associated with some of these differential diagnosis.  Medical Data Reviewed: 1.Data reviewed include clinical labs, radiology, Medical Tests; 2.Tests results discussed w/performing or interpreting physician;    3.Obtaining/reviewing old medical records; 4.Obtaining case  history from another source;    5.Independent review of image, tracing or specimen.  Signed: _0 @   11/15/2017  4:54 PM

## 2017-11-15 NOTE — Progress Notes (Signed)
CODE STROKE- PHARMACY COMMUNICATION   Time CODE STROKE called/page received:16:41  Time response to CODE STROKE was made (in person or via phone): 17:06  Time Stroke Kit retrieved from [facility] (only if needed): N/A  Name of Provider/Nurse contacted:NIcole  Past Medical History:  Diagnosis Date  . Anemia   . Anemia, iron deficiency 11/07/2013  . Arthritis   . CAD (coronary artery disease)   . Cerebrovascular accident ([place])   . Chicken pox   . Chronic kidney disease   . Colitis    ? at 89  . Diabetes mellitus   . Hashimoto thyroiditis    w subsequent hypothyroidism  . Headache   . HTN (hypertension)   . Hyperlipidemia   . Hypothyroidism   . Myocardial infarction ([place])   . 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 Sleep apnea    Prior to Admission medications   Medication Sig Start Date End Date Taking? Authorizing Provider  amLODipine (NORVASC) 10 MG tablet Take 0.5 tablets (5 mg total) by mouth daily. 09/03/17   Gladstone Lighter, MD  aspirin EC 81 MG tablet Take by mouth.    [provider]  benazepril (LOTENSIN) 40 MG tablet TAKE ONE TABLET BY MOUTH ONCE DAILY 08/24/14   Susy Frizzle, MD  benzonatate (TESSALON) 200 MG capsule Take by mouth. 11/13/16   [provider]  Cholecalciferol (VITAMIN D3) 1000 UNITS CAPS Take 1,000 Units by mouth daily.     [provider]  cloNIDine (CATAPRES) 0.1 MG tablet TAKE ONE TABLET BY MOUTH TWICE DAILY 01/25/17   [provider]  Coenzyme Q10 (COQ10) 100 MG CAPS Take 1 capsule by mouth daily.     [provider]  Coenzyme Q10-Vitamin E 100-100 MG-UNIT CAPS Take by mouth.    [provider]  doxycycline (VIBRAMYCIN) 100 MG capsule Take by mouth. 01/09/16   [provider]  ferrous sulfate 325 (65 FE) MG tablet Take 1 tablet (325 mg total) by mouth 2 (two) times daily with a meal. 09/03/17 11/02/17  Gladstone Lighter, MD  furosemide (LASIX) 40 MG tablet  TAKE ONE TABLET BY MOUTH ONCE DAILY AS NEEDED FOR EDEMA (SWELLING) 11/07/14   [provider]  glimepiride (AMARYL) 1 MG tablet Take 1 tablet by mouth daily. 06/15/17   [provider]  hydrALAZINE (APRESOLINE) 25 MG tablet Take 1 tablet (25 mg total) by mouth 3 (three) times daily. Patient taking differently: Take 50 mg by mouth 3 (three) times daily.  08/09/13   Susy Frizzle, MD  HYDROcodone-acetaminophen (NORCO/VICODIN) 5-325 MG tablet Take by mouth. 01/09/16   [provider]  levothyroxine (SYNTHROID, LEVOTHROID) 100 MCG tablet Take 1 tablet (100 mcg total) by mouth daily. Patient taking differently: Take 150 mcg by mouth daily.  08/21/13   Susy Frizzle, MD  lovastatin (MEVACOR) 20 MG tablet Take 2 tablets (40 mg total) by mouth at bedtime. 02/06/14   Susy Frizzle, MD  Multiple Vitamins-Minerals (MULTIVITAMIN WITH MINERALS) tablet Take 1 tablet by mouth daily.    [provider]  Omega-3 Fatty Acids (FISH OIL) 1200 MG CAPS Take 1 capsule by mouth daily.     [provider]  pantoprazole (PROTONIX) 20 MG tablet Take 1 tablet (20 mg total) by mouth 2 (two) times daily. 09/03/17 12/02/17  Gladstone Lighter, MD  sucralfate (CARAFATE) 1 g tablet Take 1 tablet (1 g total) by mouth 4 (four) times daily. Dissolve in 40m water and  Drink the slurry prior  to eating. 09/03/17 11/02/17  Gladstone Lighter, MD  vitamin B-12 (CYANOCOBALAMIN) 1000 MCG tablet Take 1,000 mcg by mouth daily.    [provider]    Laural Benes, [place].D., BCPS Clinical Pharmacist  11/15/2017  5:06 PM

## 2017-11-15 NOTE — ED Provider Notes (Signed)
[facility] Emergency Department Provider Note    First MD Initiated Contact with Patient 11/15/17 1649     (approximate)  I have reviewed the triage vital signs and the nursing notes.   HISTORY  Chief Complaint Dizziness and Loss of Vision    HPI [name] is a 67 y.o. female multiple chronic medical conditions presents to the ER with chief complaint of right-sided blurry vision and complete loss of vision started around 2:00 today she was at work as well as feeling dizzy particularly when she moved her stood up.  Symptoms the right eye vision loss lasted roughly 15 minutes and then it returned.  Denies any headache but has a history of migraine headaches for which she will frequently get auras.  Denies any numbness or tingling at this time.  Does have a history of a stroke with symptoms corresponding her right sided weakness but did not have any of that today.  Denies any chest pain.  No neck pain.  No shortness of breath.  No medication changes.    Past Medical History:  Diagnosis Date  . Anemia   . Anemia, iron deficiency 11/07/2013  . Arthritis   . CAD (coronary artery disease)   . Cerebrovascular accident ([place])   . Chicken pox   . Chronic kidney disease   . Colitis    ? at 35  . Diabetes mellitus   . Hashimoto thyroiditis    w subsequent hypothyroidism  . Headache   . HTN (hypertension)   . Hyperlipidemia   . Hypothyroidism   . Myocardial infarction ([place])   . 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 Sleep apnea    Family History  Problem Relation Age of Onset  . Coronary artery disease Other   . Stroke Maternal Grandmother   . Diabetes Paternal Grandmother        diabetic coma  . Heart attack Father   . Heart attack Mother   . Heart attack Maternal Grandfather   . Heart attack Paternal Grandfather    Past Surgical History:  Procedure Laterality Date  . ABDOMINAL HYSTERECTOMY    . CHOLECYSTECTOMY     . COLONOSCOPY WITH PROPOFOL N/A 12/24/2014   Procedure: COLONOSCOPY WITH PROPOFOL;  Surgeon: Hulen Luster, MD;  Location: [facility] ENDOSCOPY;  Service: Gastroenterology;  Laterality: N/A;  . CORONARY ARTERY BYPASS GRAFT    . ESOPHAGOGASTRODUODENOSCOPY N/A 09/02/2017   Procedure: ESOPHAGOGASTRODUODENOSCOPY (EGD);  Surgeon: Virgel Manifold, MD;  Location: [facility] ENDOSCOPY;  Service: Endoscopy;  Laterality: N/A;  . ESOPHAGOGASTRODUODENOSCOPY (EGD) WITH PROPOFOL N/A 12/24/2014   Procedure: ESOPHAGOGASTRODUODENOSCOPY (EGD) WITH PROPOFOL;  Surgeon: Hulen Luster, MD;  Location: [facility] ENDOSCOPY;  Service: Gastroenterology;  Laterality: N/A;  . JOINT REPLACEMENT    . ROUX-EN-Y GASTRIC BYPASS     loss over 100 pounds  . TONSILLECTOMY    . TOTAL KNEE ARTHROPLASTY Left    Patient Active Problem List   Diagnosis Date Noted  . Lesion of esophagus   . Acute gastric ulcer without hemorrhage or perforation   . Anemia 09/01/2017  . DOE (dyspnea on exertion) 09/01/2017  . Chronic insomnia 01/26/2017  . Postmenopausal 08/05/2016  . Low TSH level 01/12/2016  . Coronary artery disease with hx of myocardial infarct w/o hx of CABG 05/21/2015  . Chronic kidney disease, stage III (moderate) ([place]) 10/16/2014  . Acquired hypothyroidism 08/06/2014  . Type 2 diabetes mellitus without complication ([place]) 43/32/9518  . Anemia, iron deficiency 11/07/2013  .  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 Heme + stool 11/07/2013  . Feces contents abnormal 11/07/2013  . HYPERTENSION, BENIGN 11/10/2008  . CAD, ARTERY BYPASS GRAFT 11/10/2008      Prior to Admission medications   Medication Sig Start Date End Date Taking? Authorizing Provider  amLODipine (NORVASC) 10 MG tablet Take 0.5 tablets (5 mg total) by mouth daily. 09/03/17  Yes Gladstone Lighter, MD  benazepril (LOTENSIN) 40 MG tablet TAKE ONE TABLET BY MOUTH ONCE DAILY 08/24/14  Yes Susy Frizzle, MD  Cholecalciferol (VITAMIN D3) 1000  UNITS CAPS Take 1,000 Units by mouth daily.    Yes [provider]  Coenzyme Q10 (COQ10) 100 MG CAPS Take 1 capsule by mouth daily.    Yes [provider]  furosemide (LASIX) 40 MG tablet TAKE ONE TABLET BY MOUTH ONCE DAILY AS NEEDED FOR EDEMA (SWELLING) 11/07/14  Yes [provider]  glimepiride (AMARYL) 1 MG tablet Take 1 tablet by mouth daily. 06/15/17  Yes [provider]  hydrALAZINE (APRESOLINE) 25 MG tablet Take 1 tablet (25 mg total) by mouth 3 (three) times daily. Patient taking differently: Take 50 mg by mouth 3 (three) times daily.  08/09/13  Yes Susy Frizzle, MD  levothyroxine (SYNTHROID, LEVOTHROID) 100 MCG tablet Take 1 tablet (100 mcg total) by mouth daily. Patient taking differently: Take 150 mcg by mouth daily.  08/21/13  Yes Susy Frizzle, MD  lovastatin (MEVACOR) 20 MG tablet Take 2 tablets (40 mg total) by mouth at bedtime. 02/06/14  Yes Susy Frizzle, MD  Multiple Vitamins-Minerals (MULTIVITAMIN WITH MINERALS) tablet Take 1 tablet by mouth daily.   Yes [provider]  Omega-3 Fatty Acids (FISH OIL) 1200 MG CAPS Take 1 capsule by mouth daily.    Yes [provider]  pantoprazole (PROTONIX) 20 MG tablet Take 1 tablet (20 mg total) by mouth 2 (two) times daily. 09/03/17 12/02/17 Yes Gladstone Lighter, MD  vitamin B-12 (CYANOCOBALAMIN) 1000 MCG tablet Take 1,000 mcg by mouth daily.   Yes [provider]  aspirin EC 81 MG tablet Take 81 mg by mouth daily.     [provider]    Allergies Ultram [tramadol hcl]; Baclofen; Cardura [doxazosin mesylate]; Morphine and related; and Other    Social History Social History   Tobacco Use  . Smoking status: Never Smoker  . Smokeless tobacco: Never Used  Substance Use Topics  . Alcohol use: No  . Drug use: No    Review of Systems Patient denies headaches, rhinorrhea, blurry vision, numbness, shortness of breath, chest pain, edema, cough, abdominal pain,  nausea, vomiting, diarrhea, dysuria, fevers, rashes or hallucinations unless otherwise stated above in HPI. ____________________________________________   PHYSICAL EXAM:  VITAL SIGNS: Vitals:   11/15/17 2030 11/15/17 2200  BP: (!) 159/56 (!) 177/72  Pulse: (!) 59 (!) 59  Resp: 19 19  Temp:    SpO2: 98% 96%    Constitutional: Alert and oriented.  Eyes: Conjunctivae are normal. No hyphema or vitreous hemorrhage noted. Head: Atraumatic. Nose: No congestion/rhinnorhea. Mouth/Throat: Mucous membranes are moist.   Neck: No stridor. Painless ROM.  Cardiovascular: Normal rate, regular rhythm. Grossly normal heart sounds.  Good peripheral circulation. Respiratory: Normal respiratory effort.  No retractions. Lungs CTAB. Gastrointestinal: Soft and nontender. No distention. No abdominal bruits. No CVA tenderness. Genitourinary:  Musculoskeletal: No lower extremity tenderness nor edema.  No joint effusions. Neurologic:  Normal speech and language. No gross focal neurologic deficits are appreciated. No facial  droop Skin:  Skin is warm, dry and intact. No rash noted. Psychiatric: Mood and affect are normal. Speech and behavior are normal.  ____________________________________________   LABS (all labs ordered are listed, but only abnormal results are displayed)  Results for orders placed or performed during the hospital encounter of 11/15/17 (from the past 24 hour(s))  Glucose, capillary     Status: None   Collection Time: 11/15/17  4:38 PM  Result Value Ref Range   Glucose-Capillary 98 65 - 99 mg/dL  Protime-INR     Status: None   Collection Time: 11/15/17  4:42 PM  Result Value Ref Range   Prothrombin Time 14.0 11.4 - 15.2 seconds   INR 1.09   APTT     Status: None   Collection Time: 11/15/17  4:42 PM  Result Value Ref Range   aPTT 31 24 - 36 seconds  CBC     Status: Abnormal   Collection Time: 11/15/17  4:42 PM  Result Value Ref Range   WBC 6.5 3.6 - 11.0 K/uL   RBC 4.04 3.80  - 5.20 MIL/uL   Hemoglobin 11.1 (L) 12.0 - 16.0 g/dL   HCT 33.3 (L) 35.0 - 47.0 %   MCV 82.4 80.0 - 100.0 fL   MCH 27.5 26.0 - 34.0 pg   MCHC 33.4 32.0 - 36.0 g/dL   RDW 21.4 (H) 11.5 - 14.5 %   Platelets 260 150 - 440 K/uL  Differential     Status: None   Collection Time: 11/15/17  4:42 PM  Result Value Ref Range   Neutrophils Relative % 52 %   Neutro Abs 3.4 1.4 - 6.5 K/uL   Lymphocytes Relative 32 %   Lymphs Abs 2.1 1.0 - 3.6 K/uL   Monocytes Relative 11 %   Monocytes Absolute 0.7 0.2 - 0.9 K/uL   Eosinophils Relative 4 %   Eosinophils Absolute 0.2 0 - 0.7 K/uL   Basophils Relative 1 %   Basophils Absolute 0.0 0 - 0.1 K/uL  Comprehensive metabolic panel     Status: Abnormal   Collection Time: 11/15/17  4:42 PM  Result Value Ref Range   Sodium 142 135 - 145 mmol/L   Potassium 3.7 3.5 - 5.1 mmol/L   Chloride 113 (H) 101 - 111 mmol/L   CO2 22 22 - 32 mmol/L   Glucose, Bld 67 65 - 99 mg/dL   BUN 25 (H) 6 - 20 mg/dL   Creatinine, Ser 1.60 (H) 0.44 - 1.00 mg/dL   Calcium 8.4 (L) 8.9 - 10.3 mg/dL   Total Protein 6.3 (L) 6.5 - 8.1 g/dL   Albumin 3.5 3.5 - 5.0 g/dL   AST 18 15 - 41 U/L   ALT 14 14 - 54 U/L   Alkaline Phosphatase 92 38 - 126 U/L   Total Bilirubin 0.5 0.3 - 1.2 mg/dL   GFR calc non Af Amer 32 (L) >60 mL/min   GFR calc Af Amer 37 (L) >60 mL/min   Anion gap 7 5 - 15  Troponin I     Status: Abnormal   Collection Time: 11/15/17  4:42 PM  Result Value Ref Range   Troponin I 0.03 (HH) <0.03 ng/mL  Troponin I     Status: Abnormal   Collection Time: 11/15/17 10:12 PM  Result Value Ref Range   Troponin I 0.03 (HH) <0.03 ng/mL   ____________________________________________  EKG My review and personal interpretation at Time: 16:36   Indication: dizziness  Rate: 60  Rhythm:  sinus Axis: normal Other: poor r wave progression, no stemi ____________________________________________  RADIOLOGY  I personally reviewed all radiographic images ordered to evaluate for  the above acute complaints and reviewed radiology reports and findings.  These findings were personally discussed with the patient.  Please see medical record for radiology report.  ____________________________________________   PROCEDURES  Procedure(s) performed:  Procedures    Critical Care performed: no ____________________________________________   INITIAL IMPRESSION / ASSESSMENT AND PLAN / ED COURSE  Pertinent labs & imaging results that were available during my care of the patient were reviewed by me and considered in my medical decision making (see chart for details).   DDX: cva, bppv, tia, retinal hemorrhage, amorosis fugax, uti,   [name] is a 67 y.o. who presents to the ED with symptoms as described above.  Patient initially made a code stroke due to onset of symptoms however her symptoms were resolving by the time I evaluated patient.  Clearly not candidate for TPA and her symptoms seem to be mild and atypical at this..  Retinal exam shows no evidence of hemorrhage.  Not clinically consistent with retinal detachment.  She has no visual field cuts.  Will order MRI to further evaluate her symptoms.  Clinical Course as of Nov 16 2310  Mon Nov 15, 2017  2231 Patient reassessed.  Discussed results with patient at bedside.  MRI fortunately is reassuring and symptoms have completely resolved.  Patient requesting discharge home.  As there is no evidence of acute infarct on MRI I do believe patient appropriate for outpatient follow-up with referral to ophthalmology.  We will await repeat troponin and if negative will discharge home.   [PR]    Clinical Course User Index [PR] Merlyn Lot, MD     As part of my medical decision making, I reviewed the following data within the [facility] notes reviewed and incorporated, Labs reviewed, notes from prior ED visits and [place] Controlled Substance  Database   ____________________________________________   FINAL CLINICAL IMPRESSION(S) / ED DIAGNOSES  Final diagnoses:  Dizziness  Vision loss of right eye      NEW MEDICATIONS STARTED DURING THIS VISIT:  New Prescriptions   No medications on file     Note:  This document was prepared using Dragon voice recognition software and may include unintentional dictation errors.    Merlyn Lot, MD 11/15/17 [phone]

## 2017-11-15 NOTE — ED Notes (Signed)
CODE STROKE CALLED TO 333 

## 2017-12-14 DIAGNOSIS — G453 Amaurosis fugax: Secondary | ICD-10-CM | POA: Insufficient documentation

## 2018-08-18 ENCOUNTER — Ambulatory Visit
Admission: EM | Admit: 2018-08-18 | Discharge: 2018-08-18 | Disposition: A | Discharge status: Home / Self Care | Payer: Medicare Other | Attending: Family Medicine | Admitting: Family Medicine

## 2018-08-18 DIAGNOSIS — Z23 Encounter for immunization: Secondary | ICD-10-CM | POA: Diagnosis not present

## 2018-08-18 DIAGNOSIS — T148XXA Other injury of unspecified body region, initial encounter: Secondary | ICD-10-CM

## 2018-08-18 MED ORDER — TETANUS-DIPHTH-ACELL PERTUSSIS 5-2.5-18.5 LF-MCG/0.5 IM SUSP
0.5000 mL | Freq: Once | INTRAMUSCULAR | Status: AC
  Administered 2018-08-18: 0.5 mL via INTRAMUSCULAR

## 2018-08-18 MED ORDER — CEPHALEXIN 250 MG PO CAPS: 250.0000 mg | ORAL_CAPSULE | Freq: Four times a day (QID) | ORAL | 0 refills | Status: AC

## 2018-08-18 NOTE — ED Provider Notes (Signed)
[facility]    CSN: [id number] Arrival date & time: 08/18/18  1053     History   Chief Complaint Chief Complaint  Patient presents with  . Foot Injury    HPI [name] is a 68 y.o. female history of CAD, CKD, DM type II, hypothyroid, presenting today for evaluation of puncture wound.  Patient states that 3 nights ago she stepped on the "angle of a tool" while it was dark.  She had some mild discomfort initially, but no difficulty walking afterwards.  Over the subsequent days she has developed increasing pain and discomfort around this area.  She is concerned about infection as it is been changing color as well.  States her last tetanus was 10 to 12 years ago.  Notes that she has neuropathy and numbness and tingling is normal for her.  HPI  Past Medical History:  Diagnosis Date  . Anemia   . Anemia, iron deficiency 11/07/2013  . Arthritis   . CAD (coronary artery disease)   . Cerebrovascular accident ([place])   . Chicken pox   . Chronic kidney disease   . Colitis    ? at 26  . Diabetes mellitus   . Hashimoto thyroiditis    w subsequent hypothyroidism  . Headache   . HTN (hypertension)   . Hyperlipidemia   . Hypothyroidism   . Myocardial infarction ([place])   . 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 Sleep apnea     Patient Active Problem List   Diagnosis Date Noted  . Lesion of esophagus   . Acute gastric ulcer without hemorrhage or perforation   . Anemia 09/01/2017  . DOE (dyspnea on exertion) 09/01/2017  . Chronic insomnia 01/26/2017  . Postmenopausal 08/05/2016  . Low TSH level 01/12/2016  . Coronary artery disease with hx of myocardial infarct w/o hx of CABG 05/21/2015  . Chronic kidney disease, stage III (moderate) ([place]) 10/16/2014  . Acquired hypothyroidism 08/06/2014  . Type 2 diabetes mellitus without complication ([place]) 40/81/4481  . Anemia, iron deficiency 11/07/2013  . Postoperative malabsorption - s/p gastric  bypass roux-en-Y 11/07/2013  . Heme + stool 11/07/2013  . Feces contents abnormal 11/07/2013  . HYPERTENSION, BENIGN 11/10/2008  . CAD, ARTERY BYPASS GRAFT 11/10/2008    Past Surgical History:  Procedure Laterality Date  . ABDOMINAL HYSTERECTOMY    . CHOLECYSTECTOMY    . COLONOSCOPY WITH PROPOFOL N/A 12/24/2014   Procedure: COLONOSCOPY WITH PROPOFOL;  Surgeon: Hulen Luster, MD;  Location: [facility] ENDOSCOPY;  Service: Gastroenterology;  Laterality: N/A;  . CORONARY ARTERY BYPASS GRAFT    . ESOPHAGOGASTRODUODENOSCOPY N/A 09/02/2017   Procedure: ESOPHAGOGASTRODUODENOSCOPY (EGD);  Surgeon: Virgel Manifold, MD;  Location: [facility] ENDOSCOPY;  Service: Endoscopy;  Laterality: N/A;  . ESOPHAGOGASTRODUODENOSCOPY (EGD) WITH PROPOFOL N/A 12/24/2014   Procedure: ESOPHAGOGASTRODUODENOSCOPY (EGD) WITH PROPOFOL;  Surgeon: Hulen Luster, MD;  Location: [facility] ENDOSCOPY;  Service: Gastroenterology;  Laterality: N/A;  . JOINT REPLACEMENT    . ROUX-EN-Y GASTRIC BYPASS     loss over 100 pounds  . TONSILLECTOMY    . TOTAL KNEE ARTHROPLASTY Left     OB History   No obstetric history on file.      Home Medications    Prior to Admission medications   Medication Sig Start Date End Date Taking? Authorizing Provider  amLODipine (NORVASC) 10 MG tablet Take 0.5 tablets (5 mg total) by mouth daily. 09/03/17   Gladstone Lighter, MD  aspirin EC 81 MG tablet  Take 81 mg by mouth daily.     [provider]  benazepril (LOTENSIN) 40 MG tablet TAKE ONE TABLET BY MOUTH ONCE DAILY 08/24/14   Susy Frizzle, MD  cephALEXin (KEFLEX) 250 MG capsule Take 1 capsule (250 mg total) by mouth 4 (four) times daily for 7 days. 08/18/18 08/25/18  Neilani Duffee C, PA-C  Cholecalciferol (VITAMIN D3) 1000 UNITS CAPS Take 1,000 Units by mouth daily.     [provider]  Coenzyme Q10 (COQ10) 100 MG CAPS Take 1 capsule by mouth daily.     [provider]  furosemide (LASIX) 40 MG tablet TAKE ONE TABLET BY MOUTH ONCE  DAILY AS NEEDED FOR EDEMA (SWELLING) 11/07/14   [provider]  glimepiride (AMARYL) 1 MG tablet Take 1 tablet by mouth daily. 06/15/17   [provider]  hydrALAZINE (APRESOLINE) 25 MG tablet Take 1 tablet (25 mg total) by mouth 3 (three) times daily. Patient taking differently: Take 50 mg by mouth 3 (three) times daily.  08/09/13   Susy Frizzle, MD  levothyroxine (SYNTHROID, LEVOTHROID) 100 MCG tablet Take 1 tablet (100 mcg total) by mouth daily. Patient taking differently: Take 150 mcg by mouth daily.  08/21/13   Susy Frizzle, MD  lovastatin (MEVACOR) 20 MG tablet Take 2 tablets (40 mg total) by mouth at bedtime. 02/06/14   Susy Frizzle, MD  Multiple Vitamins-Minerals (MULTIVITAMIN WITH MINERALS) tablet Take 1 tablet by mouth daily.    [provider]  Omega-3 Fatty Acids (FISH OIL) 1200 MG CAPS Take 1 capsule by mouth daily.     [provider]  pantoprazole (PROTONIX) 20 MG tablet Take 1 tablet (20 mg total) by mouth 2 (two) times daily. 09/03/17 12/02/17  Gladstone Lighter, MD  vitamin B-12 (CYANOCOBALAMIN) 1000 MCG tablet Take 1,000 mcg by mouth daily.    [provider]    Family History Family History  Problem Relation Age of Onset  . Coronary artery disease Other   . Stroke Maternal Grandmother   . Diabetes Paternal Grandmother        diabetic coma  . Heart attack Father   . Heart attack Mother   . Heart attack Maternal Grandfather   . Heart attack Paternal Grandfather     Social History Social History   Tobacco Use  . Smoking status: Never Smoker  . Smokeless tobacco: Never Used  Substance Use Topics  . Alcohol use: No  . Drug use: No     Allergies   Ultram [tramadol hcl]; Baclofen; Cardura [doxazosin mesylate]; Morphine and related; and Other   Review of Systems Review of Systems  Constitutional: Negative for fatigue and fever.  Eyes: Negative for visual disturbance.  Respiratory: Negative for shortness of  breath.   Cardiovascular: Negative for chest pain.  Gastrointestinal: Negative for abdominal pain, nausea and vomiting.  Musculoskeletal: Negative for arthralgias, gait problem and joint swelling.  Skin: Positive for color change and wound. Negative for rash.  Neurological: Negative for dizziness, weakness, light-headedness and headaches.     Physical Exam Triage Vital Signs ED Triage Vitals [08/18/18 1112]  Enc Vitals Group     BP (!) 169/77     Pulse Rate 61     Resp 18     Temp (!) 97.4 F (36.3 C)     Temp Source Oral     SpO2 97 %     Weight      Height      Head Circumference  Peak Flow      Pain Score 5     Pain Loc      Pain Edu?      Excl. in [place]?    No data found.  Updated Vital Signs BP (!) 169/77 (BP Location: Left Arm)   Pulse 61   Temp (!) 97.4 F (36.3 C) (Oral)   Resp 18   SpO2 97%   Visual Acuity Right Eye Distance:   Left Eye Distance:   Bilateral Distance:    Right Eye Near:   Left Eye Near:    Bilateral Near:     Physical Exam Vitals signs and nursing note reviewed.  Constitutional:      Appearance: She is well-developed.     Comments: No acute distress  HENT:     Head: Normocephalic and atraumatic.     Nose: Nose normal.  Eyes:     Conjunctiva/sclera: Conjunctivae normal.  Neck:     Musculoskeletal: Neck supple.  Cardiovascular:     Rate and Rhythm: Normal rate.  Pulmonary:     Effort: Pulmonary effort is normal. No respiratory distress.  Abdominal:     General: There is no distension.  Musculoskeletal: Normal range of motion.     Comments: Nontender to palpation throughout dorsum of foot throughout first through fifth metatarsals, full active range of motion at ankle Dorsalis pedis 2+  Skin:    General: Skin is warm and dry.     Comments: Left foot: Plantar surface of left foot with small punctate area of bruising with surrounding white discoloration, possible underlying pus, no significant erythema, mild tenderness to  palpation  Neurological:     Mental Status: She is alert and oriented to person, place, and time.      UC Treatments / Results  Labs (all labs ordered are listed, but only abnormal results are displayed) Labs Reviewed - No data to display  EKG None  Radiology No results found.  Procedures Procedures (including critical care time)  Medications Ordered in UC Medications  Tdap (BOOSTRIX) injection 0.5 mL (0.5 mLs Intramuscular Given 08/18/18 1118)    Initial Impression / Assessment and Plan / UC Course  I have reviewed the triage vital signs and the nursing notes.  Pertinent labs & imaging results that were available during my care of the patient were reviewed by me and considered in my medical decision making (see chart for details).    Do not suspect underlying fracture, mainly soft tissue injury, do not suspect retained foreign body based off patient's history and palpation. Patient with puncture wound to plantar surface of left foot, tetanus updated today, given diabetes history as well as appearance of possible pus surrounding the area will initiate on antibiotics; will provide Keflex 254 times a day for the next week.  Decreased dose due to kidney disease, CrCl calculated to be 52.  Continue to monitor area,Discussed strict return precautions. Patient verbalized understanding and is agreeable with plan.  Final Clinical Impressions(s) / UC Diagnoses   Final diagnoses:  Puncture wound     Discharge Instructions     We updated your tetanus today Begin Keflex 4 times a day for the next week Keep foot elevated May apply ice Tylenol for pain  Please follow-up developing increased pain, swelling, redness to the area, area not healing   ED Prescriptions    Medication Sig Dispense Auth. Provider   cephALEXin (KEFLEX) 250 MG capsule Take 1 capsule (250 mg total) by mouth 4 (four) times daily  for 7 days. 28 capsule Ryun Velez C, PA-C     Controlled Substance  Prescriptions [place] Controlled Substance Registry consulted? Not Applicable   Janith Lima, [place] 08/18/18 1249

## 2018-08-18 NOTE — Discharge Instructions (Signed)
We updated your tetanus today Begin Keflex 4 times a day for the next week Keep foot elevated May apply ice Tylenol for pain  Please follow-up developing increased pain, swelling, redness to the area, area not healing

## 2018-08-18 NOTE — ED Triage Notes (Signed)
Pt states stepped on something 3 nights ago and now noticed it was a puncture mark and bruising

## 2018-11-30 DIAGNOSIS — Z0000 Encounter for general adult medical examination without abnormal findings: Secondary | ICD-10-CM | POA: Insufficient documentation

## 2018-12-16 ENCOUNTER — Other Ambulatory Visit: Payer: Self-pay | Admitting: Internal Medicine

## 2018-12-16 DIAGNOSIS — Z1231 Encounter for screening mammogram for malignant neoplasm of breast: Secondary | ICD-10-CM

## 2019-02-01 ENCOUNTER — Ambulatory Visit
Admission: RE | Admit: 2019-02-01 | Discharge: 2019-02-01 | Disposition: A | Discharge status: Home / Self Care | Payer: Medicare Other | Source: Ambulatory Visit | Attending: Internal Medicine | Admitting: Internal Medicine

## 2019-02-01 DIAGNOSIS — Z1231 Encounter for screening mammogram for malignant neoplasm of breast: Secondary | ICD-10-CM | POA: Diagnosis not present

## 2019-02-12 ENCOUNTER — Other Ambulatory Visit: Payer: Self-pay

## 2019-02-12 ENCOUNTER — Encounter: Payer: Self-pay | Admitting: Emergency Medicine

## 2019-02-12 ENCOUNTER — Emergency Department: Payer: Medicare Other

## 2019-02-12 ENCOUNTER — Emergency Department
Admission: EM | Admit: 2019-02-12 | Discharge: 2019-02-13 | Disposition: A | Discharge status: Home / Self Care | Payer: Medicare Other | Attending: Emergency Medicine | Admitting: Emergency Medicine

## 2019-02-12 DIAGNOSIS — E039 Hypothyroidism, unspecified: Secondary | ICD-10-CM | POA: Insufficient documentation

## 2019-02-12 DIAGNOSIS — I129 Hypertensive chronic kidney disease with stage 1 through stage 4 chronic kidney disease, or unspecified chronic kidney disease: Secondary | ICD-10-CM | POA: Diagnosis not present

## 2019-02-12 DIAGNOSIS — Z951 Presence of aortocoronary bypass graft: Secondary | ICD-10-CM | POA: Insufficient documentation

## 2019-02-12 DIAGNOSIS — E1122 Type 2 diabetes mellitus with diabetic chronic kidney disease: Secondary | ICD-10-CM | POA: Diagnosis not present

## 2019-02-12 DIAGNOSIS — I2510 Atherosclerotic heart disease of native coronary artery without angina pectoris: Secondary | ICD-10-CM | POA: Insufficient documentation

## 2019-02-12 DIAGNOSIS — I252 Old myocardial infarction: Secondary | ICD-10-CM | POA: Diagnosis not present

## 2019-02-12 DIAGNOSIS — R1031 Right lower quadrant pain: Secondary | ICD-10-CM | POA: Insufficient documentation

## 2019-02-12 DIAGNOSIS — Z7982 Long term (current) use of aspirin: Secondary | ICD-10-CM | POA: Insufficient documentation

## 2019-02-12 DIAGNOSIS — N183 Chronic kidney disease, stage 3 (moderate): Secondary | ICD-10-CM | POA: Insufficient documentation

## 2019-02-12 DIAGNOSIS — Z79899 Other long term (current) drug therapy: Secondary | ICD-10-CM | POA: Insufficient documentation

## 2019-02-12 LAB — COMPREHENSIVE METABOLIC PANEL
ALT: 16 U/L (ref 0–44)
AST: 18 U/L (ref 15–41)
Albumin: 3.5 g/dL (ref 3.5–5.0)
Alkaline Phosphatase: 121 U/L (ref 38–126)
Anion gap: 5 (ref 5–15)
BUN: 24 mg/dL — ABNORMAL HIGH (ref 8–23)
CO2: 21 mmol/L — ABNORMAL LOW (ref 22–32)
Calcium: 8.2 mg/dL — ABNORMAL LOW (ref 8.9–10.3)
Chloride: 116 mmol/L — ABNORMAL HIGH (ref 98–111)
Creatinine, Ser: 1.62 mg/dL — ABNORMAL HIGH (ref 0.44–1.00)
GFR calc Af Amer: 37 mL/min — ABNORMAL LOW (ref >60)
GFR calc non Af Amer: 32 mL/min — ABNORMAL LOW (ref >60)
Glucose, Bld: 114 mg/dL — ABNORMAL HIGH (ref 70–99)
Potassium: 3.9 mmol/L (ref 3.5–5.1)
Sodium: 142 mmol/L (ref 135–145)
Total Bilirubin: 0.6 mg/dL (ref 0.3–1.2)
Total Protein: 5.9 g/dL — ABNORMAL LOW (ref 6.5–8.1)

## 2019-02-12 LAB — URINALYSIS, COMPLETE (UACMP) WITH MICROSCOPIC
Bilirubin Urine: NEGATIVE
Glucose, UA: NEGATIVE mg/dL
Hgb urine dipstick: NEGATIVE
Ketones, ur: NEGATIVE mg/dL
Nitrite: POSITIVE — AB
Protein, ur: 30 mg/dL — AB
Specific Gravity, Urine: 1.016 (ref 1.005–1.030)
pH: 5 (ref 5.0–8.0)

## 2019-02-12 LAB — LIPASE, BLOOD: Lipase: 32 U/L (ref 11–51)

## 2019-02-12 LAB — CBC
HCT: 30.1 % — ABNORMAL LOW (ref 36.0–46.0)
Hemoglobin: 8.8 g/dL — ABNORMAL LOW (ref 12.0–15.0)
MCH: 24.7 pg — ABNORMAL LOW (ref 26.0–34.0)
MCHC: 29.2 g/dL — ABNORMAL LOW (ref 30.0–36.0)
MCV: 84.6 fL (ref 80.0–100.0)
Platelets: 246 10*3/uL (ref 150–400)
RBC: 3.56 MIL/uL — ABNORMAL LOW (ref 3.87–5.11)
RDW: 14.6 % (ref 11.5–15.5)
WBC: 5.5 10*3/uL (ref 4.0–10.5)
nRBC: 0 % (ref 0.0–0.2)

## 2019-02-12 MED ORDER — CEPHALEXIN 500 MG PO CAPS
500.0000 mg | ORAL_CAPSULE | Freq: Once | ORAL | Status: AC
  Administered 2019-02-12: 500 mg via ORAL
  Filled 2019-02-12: qty 1

## 2019-02-12 MED ORDER — CEPHALEXIN 500 MG PO CAPS: 500.0000 mg | ORAL_CAPSULE | Freq: Two times a day (BID) | ORAL | 0 refills | Status: AC

## 2019-02-12 MED ORDER — IOHEXOL 9 MG/ML PO SOLN
500.0000 mL | Freq: Two times a day (BID) | ORAL | Status: DC | PRN
  Administered 2019-02-12 (×2): 500 mL via ORAL
  Filled 2019-02-12 (×2): qty 500

## 2019-02-12 MED ORDER — SODIUM CHLORIDE 0.9 % IV BOLUS
1000.0000 mL | Freq: Once | INTRAVENOUS | Status: AC
  Administered 2019-02-12: 1000 mL via INTRAVENOUS

## 2019-02-12 MED ORDER — IOHEXOL 300 MG/ML  SOLN
75.0000 mL | Freq: Once | INTRAMUSCULAR | Status: AC | PRN
  Administered 2019-02-12: 75 mL via INTRAVENOUS

## 2019-02-12 MED ORDER — HYDRALAZINE HCL 50 MG PO TABS
50.0000 mg | ORAL_TABLET | Freq: Once | ORAL | Status: AC
  Administered 2019-02-12: 22:00:00 50 mg via ORAL
  Filled 2019-02-12: qty 1

## 2019-02-12 NOTE — ED Notes (Signed)
Pt up to use restroom.

## 2019-02-12 NOTE — Discharge Instructions (Addendum)
Please return for worse pain fever vomiting.  Please follow-up with your doctor Dr. Ouida Sills at the congenital clinic on Tuesday.  We may need to re-CT you with the pain has not completely resolved.  You also have a little UTI which I will give you some antibiotics for.  I will give you Keflex 1 pill 2 times a day.  Continue your other medicines.  Remember I gave you the hydralazine once today.

## 2019-02-12 NOTE — ED Notes (Signed)
Report given to Dawn, RN.  

## 2019-02-12 NOTE — ED Provider Notes (Signed)
[facility] Emergency Department Provider Note   ____________________________________________   First MD Initiated Contact with Patient 02/12/19 1721     (approximate)  I have reviewed the triage vital signs and the nursing notes.   HISTORY  Chief Complaint Abdominal Pain   HPI [name] is a 68 y.o. female patient reports several days of worsening right lower quadrant pain.  Is worse when she moves or walks.  The pain gets better when she rests.  He is not having any nausea vomiting diarrhea or fever.  He has not had pain like this before.  She reports she has her appendix as far she knows and only one ovary everything else is gone.  Patient does have some renal dysfunction.  I discussed CT with IV contrast her GFR was 32 when I looked.  She is aware there is some risk of worsening renal function with the contrast but hopefully this will be minimized by using low-dose protocol and fluids         Past Medical History:  Diagnosis Date   Anemia    Anemia, iron deficiency 11/07/2013   Arthritis    CAD (coronary artery disease)    Cerebrovascular accident ([place])    Chicken pox    Chronic kidney disease    Colitis    ? 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   Heme + stool 11/07/2013   Feces contents abnormal 11/07/2013   HYPERTENSION, BENIGN 11/10/2008   CAD, ARTERY BYPASS GRAFT 11/10/2008    Past Surgical History:  Procedure Laterality Date   ABDOMINAL HYSTERECTOMY     CHOLECYSTECTOMY     COLONOSCOPY WITH PROPOFOL N/A 12/24/2014   Procedure: COLONOSCOPY WITH PROPOFOL;  Surgeon: Hulen Luster, MD;  Location: [facility] ENDOSCOPY;  Service: Gastroenterology;  Laterality: N/A;   CORONARY ARTERY BYPASS GRAFT     ESOPHAGOGASTRODUODENOSCOPY N/A 09/02/2017   Procedure: ESOPHAGOGASTRODUODENOSCOPY (EGD);  Surgeon: Virgel Manifold, MD;  Location: [facility] ENDOSCOPY;  Service: Endoscopy;  Laterality: N/A;   ESOPHAGOGASTRODUODENOSCOPY (EGD) WITH PROPOFOL N/A 12/24/2014   Procedure: ESOPHAGOGASTRODUODENOSCOPY (EGD) WITH PROPOFOL;  Surgeon: Hulen Luster, MD;  Location: [facility] ENDOSCOPY;  Service: Gastroenterology;  Laterality: N/A;   JOINT REPLACEMENT     ROUX-EN-Y GASTRIC BYPASS     loss over 100 pounds   TONSILLECTOMY     TOTAL KNEE ARTHROPLASTY Left     Prior to Admission medications   Medication Sig Start Date End Date Taking? Authorizing Provider  amLODipine (NORVASC) 10 MG tablet Take 0.5 tablets (5 mg total) by mouth daily. 09/03/17  Gladstone Lighter, MD  aspirin EC 81 MG tablet Take 81 mg by mouth daily.     [provider]  benazepril (LOTENSIN) 40 MG tablet TAKE ONE TABLET BY MOUTH ONCE DAILY 08/24/14   Susy Frizzle, MD  Cholecalciferol (VITAMIN D3) 1000 UNITS CAPS Take 1,000 Units by mouth daily.     [provider]  Coenzyme Q10 (COQ10) 100 MG CAPS Take 1 capsule by mouth daily.     [provider]  furosemide (LASIX) 40 MG tablet TAKE ONE TABLET BY MOUTH ONCE DAILY AS NEEDED FOR EDEMA (SWELLING) 11/07/14    [provider]  glimepiride (AMARYL) 1 MG tablet Take 1 tablet by mouth daily. 06/15/17   [provider]  hydrALAZINE (APRESOLINE) 25 MG tablet Take 1 tablet (25 mg total) by mouth 3 (three) times daily. Patient taking differently: Take 50 mg by mouth 3 (three) times daily.  08/09/13   Susy Frizzle, MD  levothyroxine (SYNTHROID, LEVOTHROID) 100 MCG tablet Take 1 tablet (100 mcg total) by mouth daily. Patient taking differently: Take 150 mcg by mouth daily.  08/21/13   Susy Frizzle, MD  lovastatin (MEVACOR) 20 MG tablet Take 2 tablets (40 mg total) by mouth at bedtime. 02/06/14   Susy Frizzle, MD  Multiple Vitamins-Minerals (MULTIVITAMIN WITH MINERALS) tablet Take 1 tablet by mouth daily.    [provider]  Omega-3 Fatty Acids (FISH OIL) 1200 MG CAPS Take 1 capsule by mouth daily.     [provider]  pantoprazole (PROTONIX) 20 MG tablet Take 1 tablet (20 mg total) by mouth 2 (two) times daily. 09/03/17 12/02/17  Gladstone Lighter, MD  vitamin B-12 (CYANOCOBALAMIN) 1000 MCG tablet Take 1,000 mcg by mouth daily.    [provider]    Allergies Niacin, Ultram [tramadol hcl], Baclofen, Cardura [doxazosin mesylate], Morphine and related, and Other  Family History  Problem Relation Age of Onset   Coronary artery disease Other    Stroke Maternal Grandmother    Diabetes Paternal Grandmother        diabetic coma   Heart attack Father    Heart attack Mother    Heart attack Maternal Grandfather    Heart attack Paternal Grandfather    Breast cancer Neg Hx     Social History Social History   Tobacco Use   Smoking status: Never Smoker   Smokeless tobacco: Never Used  Substance Use Topics   Alcohol use: No   Drug use: No    Review of Systems  Constitutional: No fever/chills Eyes: No visual changes. ENT: No sore throat. Cardiovascular: Denies chest pain. Respiratory: Denies shortness of breath. Gastrointestinal:   abdominal pain.  No nausea, no vomiting.  No diarrhea.  No constipation. Genitourinary: Negative for dysuria. Musculoskeletal: Negative for back pain. Skin: Negative for rash. Neurological: Negative for headaches, focal weakness   ____________________________________________   PHYSICAL EXAM:  VITAL SIGNS: ED Triage Vitals  Enc Vitals Group     BP 02/12/19 1424 (!) 142/92     Pulse Rate 02/12/19 1424 63     Resp 02/12/19 1424 18     Temp 02/12/19 1424 98.6 F (37 C)     Temp Source 02/12/19 1424 Oral     SpO2 02/12/19 1424 97 %     Weight 02/12/19 1427 220 lb (99.8 kg)     Height 02/12/19 1427 5' 6.5" (1.689 m)     Head Circumference --      Peak Flow --  Pain Score 02/12/19 1427 3     Pain Loc --      Pain Edu? --      Excl. in [place]? --     Constitutional: Alert and oriented. Well appearing and in no acute distress. Eyes: Conjunctivae are normal.  Head: Atraumatic. Nose: No congestion/rhinnorhea. Mouth/Throat: Mucous membranes are moist.  Oropharynx non-erythematous. Neck: No stridor.   Cardiovascular: Normal rate, regular rhythm. Grossly normal heart sounds.  Good peripheral circulation. Respiratory: Normal respiratory effort.  No retractions. Lungs CTAB. Gastrointestinal: Soft tender to palpation in the right lower quadrant.  Not tender elsewhere.  R. No distention. No abdominal bruits. No CVA tenderness. Musculoskeletal: No lower extremity tenderness nor edema.   Neurologic:  Normal speech and language. No gross focal neurologic deficits are appreciated.  Skin:  Skin is warm, dry and intact. No rash noted.  ____________________________________________   LABS (all labs ordered are listed, but only abnormal results are displayed)  Labs Reviewed  COMPREHENSIVE METABOLIC PANEL - Abnormal; Notable for the following components:      Result Value   Chloride 116 (*)    CO2 21 (*)    Glucose, Bld 114 (*)    BUN 24 (*)    Creatinine, Ser 1.62 (*)    Calcium 8.2 (*)     Total Protein 5.9 (*)    GFR calc non Af Amer 32 (*)    GFR calc Af Amer 37 (*)    All other components within normal limits  CBC - Abnormal; Notable for the following components:   RBC 3.56 (*)    Hemoglobin 8.8 (*)    HCT 30.1 (*)    MCH 24.7 (*)    MCHC 29.2 (*)    All other components within normal limits  URINALYSIS, COMPLETE (UACMP) WITH MICROSCOPIC - Abnormal; Notable for the following components:   Color, Urine YELLOW (*)    APPearance HAZY (*)    Protein, ur 30 (*)    Nitrite POSITIVE (*)    Leukocytes,Ua TRACE (*)    Bacteria, UA FEW (*)    All other components within normal limits  LIPASE, BLOOD   ____________________________________________  EKG   ____________________________________________  RADIOLOGY  ED MD interpretation: CT only shows what appears to be peristalsis.  I reviewed the films.  Official radiology report(s): Ct Abdomen Pelvis W Contrast  Result Date: 02/12/2019 CLINICAL DATA:  Right lower quadrant pain for the past week, worse with movement. Previous cholecystectomy and hysterectomy. EXAM: CT ABDOMEN AND PELVIS WITH CONTRAST TECHNIQUE: Multidetector CT imaging of the abdomen and pelvis was performed using the standard protocol following bolus administration of intravenous contrast. CONTRAST:  61mL OMNIPAQUE IOHEXOL 300 MG/ML  SOLN COMPARISON:  Abdomen ultrasound report dated 02/05/2006 FINDINGS: Lower chest: Enlarged heart. Atheromatous aortic and coronary artery calcifications. Post CABG changes. Mild bullous change in the left lower lobe. Hepatobiliary: No focal liver abnormality is seen. Status post cholecystectomy. No biliary dilatation. Pancreas: Mild to moderate diffuse pancreatic atrophy. Spleen: Small splenic calcification. Normal in size and shape. Adrenals/Urinary Tract: Normal appearing adrenal glands. 2 mm calculus in each kidney. Small bilateral renal cysts. Unremarkable urinary bladder and ureters. Stomach/Bowel: Diffuse low density  gastric wall thickening. Small hiatal hernia. Small bowel anastomosis in the left mid abdomen. Approximately 5 cm long segment of distal ileum in the upper right pelvis with diffuse wall thickening measuring up to 9 mm in thickness and with marked luminal narrowing. There is a suggestion of associated shouldering superiorly. No abnormal bowel  dilatation seen. Multiple colonic diverticula without evidence of diverticulitis. Normal appearing appendix. Vascular/Lymphatic: Atheromatous arterial calcifications without aneurysm. No enlarged lymph nodes. Other: Small umbilical hernia containing fat and small proximal right inguinal hernia containing fat. Musculoskeletal: Left L5 pars defect with minimal anterolisthesis at the L5-S1 level. Lumbar and lower thoracic spine degenerative changes. IMPRESSION: 1. 5 cm long segment of distal ileum in the right upper pelvis with diffuse wall thickening and marked luminal narrowing. Based on the mucosal pattern, this is most likely a normal loop image during peristalsis. There are no surrounding inflammatory changes to indicate acute enteritis in the mucosal pattern does not have the typical appearance of a neoplasm. 2. Otherwise, no acute abnormality in the right lower quadrant of the abdomen. 3. Diffuse low density gastric wall thickening, compatible with gastritis. 4. Small hiatal hernia. 5. Colonic diverticulosis. 6. Small, nonobstructing bilateral renal calculi. Electronically Signed   By: Claudie Revering M.D.   On: 02/12/2019 20:59    ____________________________________________   PROCEDURES  Procedure(s) performed (including Critical Care):  Procedures   ____________________________________________   INITIAL IMPRESSION / ASSESSMENT AND PLAN / ED COURSE  Patient's pain is now considerably better.  Is still little bit of pain present on palpation right lower quadrant.  Patient does have a slight UTI.  I will give her some Keflex for this.  Blood pressures Opyd on  believe she has had her third dose of hydralazine.  I will give her that for now.  She will return if she is worse at all and follow-up with Dr. Ouida Sills on Tuesday.[name] was evaluated in Emergency Department on 02/12/2019 for the symptoms described in the history of present illness. She was evaluated in the context of the global COVID-19 pandemic, which necessitated consideration that the patient might be at risk for infection with the SARS-CoV-2 virus that causes COVID-19. Institutional protocols and algorithms that pertain to the evaluation of patients at risk for COVID-19 are in a state of rapid change based on information released by regulatory bodies including the CDC and federal and state organizations. These policies and algorithms were followed during the patient's care in the ED.           ____________________________________________   FINAL CLINICAL IMPRESSION(S) / ED DIAGNOSES  Final diagnoses:  Right lower quadrant abdominal pain     ED Discharge Orders    None       Note:  This document was prepared using Dragon voice recognition software and may include unintentional dictation errors.    Nena Polio, MD 02/12/19 2130

## 2019-02-12 NOTE — ED Triage Notes (Addendum)
Pt arrived via POV with reports of RLQ pain over the past week, pt states the pain is worse with movement. PT states the pain is non-radiating at this time. Pt states she has pain with walking and going up stairs.  Pt denies N/V/D, denies fevers

## 2019-02-12 NOTE — ED Notes (Signed)
Pt having RLQ pain- states the pain is there mostly with exertion and that she has to rest to get the pain to go away- states she had a hysterectomy and only has one ovary left, but is not sure which ovary- pt still has appendix- pt states she has never had pain like this before

## 2019-02-12 NOTE — ED Notes (Signed)
Dr. Malinda at bedside.  

## 2019-02-12 NOTE — ED Notes (Signed)
Pt up to use bathroom 

## 2019-02-12 NOTE — ED Notes (Signed)
Patient transported to CT 

## 2019-02-12 NOTE — ED Notes (Signed)
CT notified that pt was done drinking her contrast

## 2019-06-30 ENCOUNTER — Emergency Department: Payer: Medicare Other

## 2019-06-30 ENCOUNTER — Emergency Department
Admission: EM | Admit: 2019-06-30 | Discharge: 2019-06-30 | Disposition: A | Discharge status: Home / Self Care | Payer: Medicare Other | Attending: Emergency Medicine | Admitting: Emergency Medicine

## 2019-06-30 ENCOUNTER — Other Ambulatory Visit: Payer: Self-pay

## 2019-06-30 DIAGNOSIS — I129 Hypertensive chronic kidney disease with stage 1 through stage 4 chronic kidney disease, or unspecified chronic kidney disease: Secondary | ICD-10-CM | POA: Diagnosis not present

## 2019-06-30 DIAGNOSIS — E039 Hypothyroidism, unspecified: Secondary | ICD-10-CM | POA: Insufficient documentation

## 2019-06-30 DIAGNOSIS — Z7982 Long term (current) use of aspirin: Secondary | ICD-10-CM | POA: Insufficient documentation

## 2019-06-30 DIAGNOSIS — R0602 Shortness of breath: Secondary | ICD-10-CM | POA: Diagnosis present

## 2019-06-30 DIAGNOSIS — Z79899 Other long term (current) drug therapy: Secondary | ICD-10-CM | POA: Insufficient documentation

## 2019-06-30 DIAGNOSIS — I2510 Atherosclerotic heart disease of native coronary artery without angina pectoris: Secondary | ICD-10-CM | POA: Diagnosis not present

## 2019-06-30 DIAGNOSIS — Z951 Presence of aortocoronary bypass graft: Secondary | ICD-10-CM | POA: Diagnosis not present

## 2019-06-30 DIAGNOSIS — N183 Chronic kidney disease, stage 3 unspecified: Secondary | ICD-10-CM | POA: Insufficient documentation

## 2019-06-30 DIAGNOSIS — I5023 Acute on chronic systolic (congestive) heart failure: Secondary | ICD-10-CM | POA: Diagnosis not present

## 2019-06-30 LAB — BRAIN NATRIURETIC PEPTIDE: B Natriuretic Peptide: 3314 pg/mL — ABNORMAL HIGH (ref 0.0–100.0)

## 2019-06-30 LAB — BASIC METABOLIC PANEL
Anion gap: 7 (ref 5–15)
BUN: 24 mg/dL — ABNORMAL HIGH (ref 8–23)
CO2: 21 mmol/L — ABNORMAL LOW (ref 22–32)
Calcium: 8.3 mg/dL — ABNORMAL LOW (ref 8.9–10.3)
Chloride: 115 mmol/L — ABNORMAL HIGH (ref 98–111)
Creatinine, Ser: 1.5 mg/dL — ABNORMAL HIGH (ref 0.44–1.00)
GFR calc Af Amer: 40 mL/min — ABNORMAL LOW (ref >60)
GFR calc non Af Amer: 35 mL/min — ABNORMAL LOW (ref >60)
Glucose, Bld: 160 mg/dL — ABNORMAL HIGH (ref 70–99)
Potassium: 4.3 mmol/L (ref 3.5–5.1)
Sodium: 143 mmol/L (ref 135–145)

## 2019-06-30 LAB — TROPONIN I (HIGH SENSITIVITY)
Troponin I (High Sensitivity): 48 ng/L — ABNORMAL HIGH (ref <18)
Troponin I (High Sensitivity): 47 ng/L — ABNORMAL HIGH (ref <18)

## 2019-06-30 LAB — CBC
HCT: 31.1 % — ABNORMAL LOW (ref 36.0–46.0)
Hemoglobin: 8.9 g/dL — ABNORMAL LOW (ref 12.0–15.0)
MCH: 23.1 pg — ABNORMAL LOW (ref 26.0–34.0)
MCHC: 28.6 g/dL — ABNORMAL LOW (ref 30.0–36.0)
MCV: 80.6 fL (ref 80.0–100.0)
Platelets: 249 10*3/uL (ref 150–400)
RBC: 3.86 MIL/uL — ABNORMAL LOW (ref 3.87–5.11)
RDW: 17.2 % — ABNORMAL HIGH (ref 11.5–15.5)
WBC: 5.9 10*3/uL (ref 4.0–10.5)
nRBC: 0 % (ref 0.0–0.2)

## 2019-06-30 MED ORDER — FUROSEMIDE 40 MG PO TABS: 40.0000 mg | ORAL_TABLET | Freq: Two times a day (BID) | ORAL | 0 refills | Status: DC

## 2019-06-30 MED ORDER — POTASSIUM CHLORIDE ER 10 MEQ PO TBCR: 20.0000 meq | EXTENDED_RELEASE_TABLET | Freq: Every day | ORAL | 0 refills | Status: DC

## 2019-06-30 MED ORDER — FUROSEMIDE 10 MG/ML IJ SOLN
40.0000 mg | Freq: Once | INTRAMUSCULAR | Status: AC
  Administered 2019-06-30: 18:00:00 40 mg via INTRAVENOUS
  Filled 2019-06-30: qty 4

## 2019-06-30 NOTE — ED Provider Notes (Signed)
[facility] Emergency Department Provider Note  ____________________________________________   First MD Initiated Contact with Patient 06/30/19 1735     (approximate)  I have reviewed the triage vital signs and the nursing notes.   HISTORY  Chief Complaint Shortness of Breath, Weakness, and Fatigue    HPI [name] is a 69 y.o. female with past medical history as below including CAD, hypertension, hyperlipidemia, history of MI, history of CHF, here with shortness of breath with exertion.  Patient states that off and on for the last several days, she has had intermittent episodes when she feels short of breath.  These usually occur at night when she is lying flat and resolve as she sits up.  She states these have been intermittent, and not constant.  States she is noticed that she becomes more short of breath and fatigued with exertion.  Denies any chest pain.  The symptoms always resolve with rest.  She does note some increase in her lower extremity swelling.  She has been prescribed Lasix before but only as needed.  No recent diet changes, though she does states she has had somewhat increased appetite and has maybe been eating more salt than usual.  No other complaints.  No fevers or chills.        Past Medical History:  Diagnosis Date  . Anemia   . Anemia, iron deficiency 11/07/2013  . Arthritis   . CAD (coronary artery disease)   . Cerebrovascular accident ([place])   . Chicken pox   . Chronic kidney disease   . Colitis    ? at 42  . Diabetes mellitus   . Hashimoto thyroiditis    w subsequent hypothyroidism  . Headache   . HTN (hypertension)   . Hyperlipidemia   . Hypothyroidism   . Myocardial infarction ([place])   . 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 Sleep apnea     Patient Active Problem List   Diagnosis Date Noted  . Lesion of esophagus   . Acute gastric ulcer without hemorrhage or perforation   . Anemia  09/01/2017  . DOE (dyspnea on exertion) 09/01/2017  . Chronic insomnia 01/26/2017  . Postmenopausal 08/05/2016  . Low TSH level 01/12/2016  . Coronary artery disease with hx of myocardial infarct w/o hx of CABG 05/21/2015  . Chronic kidney disease, stage III (moderate) 10/16/2014  . Acquired hypothyroidism 08/06/2014  . Type 2 diabetes mellitus without complication ([place]) 123XX123  . Anemia, iron deficiency 11/07/2013  . 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 Heme + stool 11/07/2013  . Feces contents abnormal 11/07/2013  . HYPERTENSION, BENIGN 11/10/2008  . CAD, ARTERY BYPASS GRAFT 11/10/2008    Past Surgical History:  Procedure Laterality Date  . ABDOMINAL HYSTERECTOMY    . CHOLECYSTECTOMY    . COLONOSCOPY WITH PROPOFOL N/A 12/24/2014   Procedure: COLONOSCOPY WITH PROPOFOL;  Surgeon: Hulen Luster, MD;  Location: [facility] ENDOSCOPY;  Service: Gastroenterology;  Laterality: N/A;  . CORONARY ARTERY BYPASS GRAFT    . ESOPHAGOGASTRODUODENOSCOPY N/A 09/02/2017   Procedure: ESOPHAGOGASTRODUODENOSCOPY (EGD);  Surgeon: Virgel Manifold, MD;  Location: [facility] ENDOSCOPY;  Service: Endoscopy;  Laterality: N/A;  . ESOPHAGOGASTRODUODENOSCOPY (EGD) WITH PROPOFOL N/A 12/24/2014   Procedure: ESOPHAGOGASTRODUODENOSCOPY (EGD) WITH PROPOFOL;  Surgeon: Hulen Luster, MD;  Location: [facility] ENDOSCOPY;  Service: Gastroenterology;  Laterality: N/A;  . JOINT REPLACEMENT    . ROUX-EN-Y GASTRIC BYPASS     loss over 100 pounds  .  TONSILLECTOMY    . TOTAL KNEE ARTHROPLASTY Left     Prior to Admission medications   Medication Sig Start Date End Date Taking? Authorizing Provider  amLODipine (NORVASC) 10 MG tablet Take 0.5 tablets (5 mg total) by mouth daily. 09/03/17   Gladstone Lighter, MD  aspirin EC 81 MG tablet Take 81 mg by mouth daily.     [provider]  benazepril (LOTENSIN) 40 MG tablet TAKE ONE TABLET BY MOUTH ONCE DAILY 08/24/14   Susy Frizzle, MD   Cholecalciferol (VITAMIN D3) 1000 UNITS CAPS Take 1,000 Units by mouth daily.     [provider]  Coenzyme Q10 (COQ10) 100 MG CAPS Take 1 capsule by mouth daily.     [provider]  furosemide (LASIX) 40 MG tablet Take 1 tablet (40 mg total) by mouth 2 (two) times daily for 3 days. 06/30/19 07/03/19  Duffy Bruce, MD  glimepiride (AMARYL) 1 MG tablet Take 1 tablet by mouth daily. 06/15/17   [provider]  hydrALAZINE (APRESOLINE) 25 MG tablet Take 1 tablet (25 mg total) by mouth 3 (three) times daily. Patient taking differently: Take 50 mg by mouth 3 (three) times daily.  08/09/13   Susy Frizzle, MD  levothyroxine (SYNTHROID, LEVOTHROID) 100 MCG tablet Take 1 tablet (100 mcg total) by mouth daily. Patient taking differently: Take 150 mcg by mouth daily.  08/21/13   Susy Frizzle, MD  lovastatin (MEVACOR) 20 MG tablet Take 2 tablets (40 mg total) by mouth at bedtime. 02/06/14   Susy Frizzle, MD  Multiple Vitamins-Minerals (MULTIVITAMIN WITH MINERALS) tablet Take 1 tablet by mouth daily.    [provider]  Omega-3 Fatty Acids (FISH OIL) 1200 MG CAPS Take 1 capsule by mouth daily.     [provider]  pantoprazole (PROTONIX) 20 MG tablet Take 1 tablet (20 mg total) by mouth 2 (two) times daily. 09/03/17 12/02/17  Gladstone Lighter, MD  potassium chloride (KLOR-CON) 10 MEQ tablet Take 2 tablets (20 mEq total) by mouth daily for 3 days. 06/30/19 07/03/19  Duffy Bruce, MD  vitamin B-12 (CYANOCOBALAMIN) 1000 MCG tablet Take 1,000 mcg by mouth daily.    [provider]    Allergies Niacin, Ultram [tramadol hcl], Baclofen, Cardura [doxazosin mesylate], Morphine and related, and Other  Family History  Problem Relation Age of Onset  . Coronary artery disease Other   . Stroke Maternal Grandmother   . Diabetes Paternal Grandmother        diabetic coma  . Heart attack Father   . Heart attack Mother   . Heart attack Maternal  Grandfather   . Heart attack Paternal Grandfather   . Breast cancer Neg Hx     Social History Social History   Tobacco Use  . Smoking status: Never Smoker  . Smokeless tobacco: Never Used  Substance Use Topics  . Alcohol use: No  . Drug use: No    Review of Systems  Review of Systems  Constitutional: Positive for fatigue. Negative for fever.  HENT: Negative for congestion and sore throat.   Eyes: Negative for visual disturbance.  Respiratory: Positive for shortness of breath. Negative for cough.   Cardiovascular: Positive for leg swelling. Negative for chest pain.  Gastrointestinal: Negative for abdominal pain, diarrhea, nausea and vomiting.  Genitourinary: Negative for flank pain.  Musculoskeletal: Negative for back pain and neck pain.  Skin: Negative for rash and wound.  Neurological: Negative for weakness.     ____________________________________________  PHYSICAL EXAM:  VITAL SIGNS: ED Triage Vitals  Enc Vitals Group     BP 06/30/19 1610 (!) 167/99     Pulse Rate 06/30/19 1610 72     Resp 06/30/19 1610 20     Temp 06/30/19 1610 98.4 F (36.9 C)     Temp Source 06/30/19 1610 Oral     SpO2 06/30/19 1610 97 %     Weight 06/30/19 1611 221 lb (100.2 kg)     Height 06/30/19 1611 5\' 6"  (1.676 m)     Head Circumference --      Peak Flow --      Pain Score 06/30/19 1611 0     Pain Loc --      Pain Edu? --      Excl. in [place]? --      Physical Exam Vitals and nursing note reviewed.  Constitutional:      General: She is not in acute distress.    Appearance: She is well-developed.  HENT:     Head: Normocephalic and atraumatic.  Eyes:     Conjunctiva/sclera: Conjunctivae normal.  Cardiovascular:     Rate and Rhythm: Normal rate and regular rhythm.     Heart sounds: Normal heart sounds.  Pulmonary:     Effort: Pulmonary effort is normal. No respiratory distress.     Breath sounds: Examination of the right-lower field reveals rales. Examination of the  left-lower field reveals rales. Rales present. No wheezing.  Abdominal:     General: There is no distension.  Musculoskeletal:     Cervical back: Neck supple.     Right lower leg: Edema (2+ pitting) present.     Left lower leg: Edema (2+ pitting) present.  Skin:    General: Skin is warm.     Capillary Refill: Capillary refill takes less than 2 seconds.     Findings: No rash.  Neurological:     Mental Status: She is alert and oriented to person, place, and time.     Motor: No abnormal muscle tone.       ____________________________________________   LABS (all labs ordered are listed, but only abnormal results are displayed)  Labs Reviewed  CBC - Abnormal; Notable for the following components:      Result Value   RBC 3.86 (*)    Hemoglobin 8.9 (*)    HCT 31.1 (*)    MCH 23.1 (*)    MCHC 28.6 (*)    RDW 17.2 (*)    All other components within normal limits  BASIC METABOLIC PANEL - Abnormal; Notable for the following components:   Chloride 115 (*)    CO2 21 (*)    Glucose, Bld 160 (*)    BUN 24 (*)    Creatinine, Ser 1.50 (*)    Calcium 8.3 (*)    GFR calc non Af Amer 35 (*)    GFR calc Af Amer 40 (*)    All other components within normal limits  BRAIN NATRIURETIC PEPTIDE - Abnormal; Notable for the following components:   B Natriuretic Peptide 3,314.0 (*)    All other components within normal limits  TROPONIN I (HIGH SENSITIVITY) - Abnormal; Notable for the following components:   Troponin I (High Sensitivity) 48 (*)    All other components within normal limits  TROPONIN I (HIGH SENSITIVITY) - Abnormal; Notable for the following components:   Troponin I (High Sensitivity) 47 (*)    All other components within normal limits    ____________________________________________  EKG: Normal  sinus rhythm, ventricular rate 67.  PR 204, QRS 112, QTc 456.  Left axis deviation.  No acute ST elevations or depressions. ________________________________________  RADIOLOGY All  imaging, including plain films, CT scans, and ultrasounds, independently reviewed by me, and interpretations confirmed via formal radiology reads.  ED MD interpretation:   Chest x-ray: Bilateral small effusions, cardiomegaly  Official radiology report(s): DG Chest 2 View  Result Date: 06/30/2019 CLINICAL DATA:  Shortness of breath. EXAM: CHEST - 2 VIEW COMPARISON:  Chest radiograph 09/29/2008 FINDINGS: Shallow inspiration radiograph with accentuation of the cardiac silhouette and crowding of the central bronchovascular markings. Prior median sternotomy. Cardiomegaly. Aortic atherosclerosis. No evidence of airspace disease. Suggestion of trace bilateral pleural effusions. No evidence of pneumothorax. No acute bony abnormality. IMPRESSION: Shallow inspiration radiograph.  No evidence of airspace disease. Cardiomegaly. Probable trace bilateral pleural effusions. Electronically Signed   By: Kellie Simmering DO   On: 06/30/2019 16:49    ____________________________________________  PROCEDURES   Procedure(s) performed (including Critical Care):  Procedures  ____________________________________________  INITIAL IMPRESSION / MDM / [place] / ED COURSE  As part of my medical decision making, I reviewed the following data within the [place] notes reviewed and incorporated, Old chart reviewed, Notes from prior ED visits, and [place] Controlled Substance Database       *LEYLAH KNIERIM was evaluated in Emergency Department on 06/30/2019 for the symptoms described in the history of present illness. She was evaluated in the context of the global COVID-19 pandemic, which necessitated consideration that the patient might be at risk for infection with the SARS-CoV-2 virus that causes COVID-19. Institutional protocols and algorithms that pertain to the evaluation of patients at risk for COVID-19 are in a state of rapid change based on information released by regulatory bodies  including the CDC and federal and state organizations. These policies and algorithms were followed during the patient's care in the ED.  Some ED evaluations and interventions may be delayed as a result of limited staffing during the pandemic.*     Medical Decision Making: 69 year old female here with dyspnea on exertion and orthopnea.  Chest x-ray shows mild pleural effusions and exam is consistent with likely CHF.  BNP greater than 3000.  Troponin minimally elevated, but she has had multiple positive troponins in the past I suspect this is baseline leg due to her CHF and she has absolutely no chest pain, no ischemia on EKG, and I do not suspect ischemic etiology or ACS.  She has diuresed very well with IV dose of Lasix here, and is ambulatory in the ED without difficulty.  She is not hypoxic.  Will refer her to the CHF clinic and plan to start her on a regimen of Lasix over the weekend.  Return precautions given.  ____________________________________________  FINAL CLINICAL IMPRESSION(S) / ED DIAGNOSES  Final diagnoses:  Acute on chronic systolic congestive heart failure ([place])     MEDICATIONS GIVEN DURING THIS VISIT:  Medications  furosemide (LASIX) injection 40 mg (40 mg Intravenous Given 06/30/19 1826)     ED Discharge Orders         Ordered    furosemide (LASIX) 40 MG tablet  2 times daily     06/30/19 2023    potassium chloride (KLOR-CON) 10 MEQ tablet  Daily     06/30/19 2023           Note:  This document was prepared using Dragon voice recognition software and may include unintentional dictation  errors.   Duffy Bruce, MD 06/30/19 2023

## 2019-06-30 NOTE — ED Notes (Signed)
Physical copy of discharge paper work signed and sent to records.

## 2019-06-30 NOTE — Discharge Instructions (Signed)
For your shortness of breath: - START taking Furosemide (lasix) 40 mg twice a day for the next 3 days - START taking potassium with this to help prevent potassium from going low - CALL the Heart Failure clinic for follow-up, ideally early this coming week - Try to minimize your salt and fluid intake  Return to ER with any worsening shortness of breath, chest pain, or lightheadedness

## 2019-06-30 NOTE — ED Triage Notes (Signed)
Pt states that she has been sob, states that she was unable to take the kids to school today, states she is just fatigued and reports just walking into triage was exhausting, states that this has been intermittent for the past 2 weeks, but hasn't been daily.  97% on room air

## 2019-06-30 NOTE — ED Triage Notes (Signed)
FIRST NURSE NOTE- pt c/o feeling "winded" for past few weeks. Negative covid test 3 weeks ago.  Some malaise. Pt ambulatory. Sat 985 RA

## 2019-07-03 ENCOUNTER — Encounter: Payer: Self-pay | Admitting: Family

## 2019-07-03 ENCOUNTER — Other Ambulatory Visit: Payer: Self-pay

## 2019-07-03 ENCOUNTER — Telehealth: Payer: Self-pay | Admitting: Family

## 2019-07-03 ENCOUNTER — Ambulatory Visit: Payer: Medicare Other | Attending: Family | Admitting: Family

## 2019-07-03 VITALS — BP 142/72 | HR 66 | Resp 16 | Ht 66.0 in | Wt 210.0 lb

## 2019-07-03 DIAGNOSIS — Z8673 Personal history of transient ischemic attack (TIA), and cerebral infarction without residual deficits: Secondary | ICD-10-CM | POA: Diagnosis not present

## 2019-07-03 DIAGNOSIS — Z951 Presence of aortocoronary bypass graft: Secondary | ICD-10-CM | POA: Diagnosis not present

## 2019-07-03 DIAGNOSIS — I10 Essential (primary) hypertension: Secondary | ICD-10-CM

## 2019-07-03 DIAGNOSIS — E785 Hyperlipidemia, unspecified: Secondary | ICD-10-CM | POA: Diagnosis not present

## 2019-07-03 DIAGNOSIS — Z8249 Family history of ischemic heart disease and other diseases of the circulatory system: Secondary | ICD-10-CM | POA: Insufficient documentation

## 2019-07-03 DIAGNOSIS — Z888 Allergy status to other drugs, medicaments and biological substances status: Secondary | ICD-10-CM | POA: Insufficient documentation

## 2019-07-03 DIAGNOSIS — I5022 Chronic systolic (congestive) heart failure: Secondary | ICD-10-CM | POA: Insufficient documentation

## 2019-07-03 DIAGNOSIS — N189 Chronic kidney disease, unspecified: Secondary | ICD-10-CM | POA: Insufficient documentation

## 2019-07-03 DIAGNOSIS — I2510 Atherosclerotic heart disease of native coronary artery without angina pectoris: Secondary | ICD-10-CM | POA: Insufficient documentation

## 2019-07-03 DIAGNOSIS — Z7982 Long term (current) use of aspirin: Secondary | ICD-10-CM | POA: Diagnosis not present

## 2019-07-03 DIAGNOSIS — Z96652 Presence of left artificial knee joint: Secondary | ICD-10-CM | POA: Insufficient documentation

## 2019-07-03 DIAGNOSIS — Z79899 Other long term (current) drug therapy: Secondary | ICD-10-CM | POA: Diagnosis not present

## 2019-07-03 DIAGNOSIS — E1122 Type 2 diabetes mellitus with diabetic chronic kidney disease: Secondary | ICD-10-CM | POA: Insufficient documentation

## 2019-07-03 DIAGNOSIS — I130 Hypertensive heart and chronic kidney disease with heart failure and stage 1 through stage 4 chronic kidney disease, or unspecified chronic kidney disease: Secondary | ICD-10-CM | POA: Diagnosis not present

## 2019-07-03 DIAGNOSIS — Z7984 Long term (current) use of oral hypoglycemic drugs: Secondary | ICD-10-CM | POA: Diagnosis not present

## 2019-07-03 DIAGNOSIS — M1990 Unspecified osteoarthritis, unspecified site: Secondary | ICD-10-CM | POA: Diagnosis not present

## 2019-07-03 DIAGNOSIS — Z79890 Hormone replacement therapy (postmenopausal): Secondary | ICD-10-CM | POA: Insufficient documentation

## 2019-07-03 DIAGNOSIS — Z9884 Bariatric surgery status: Secondary | ICD-10-CM | POA: Diagnosis not present

## 2019-07-03 DIAGNOSIS — E039 Hypothyroidism, unspecified: Secondary | ICD-10-CM | POA: Diagnosis not present

## 2019-07-03 DIAGNOSIS — Z885 Allergy status to narcotic agent status: Secondary | ICD-10-CM | POA: Insufficient documentation

## 2019-07-03 DIAGNOSIS — I252 Old myocardial infarction: Secondary | ICD-10-CM | POA: Insufficient documentation

## 2019-07-03 DIAGNOSIS — G4730 Sleep apnea, unspecified: Secondary | ICD-10-CM | POA: Insufficient documentation

## 2019-07-03 DIAGNOSIS — I509 Heart failure, unspecified: Secondary | ICD-10-CM | POA: Diagnosis present

## 2019-07-03 MED ORDER — POTASSIUM CHLORIDE ER 10 MEQ PO TBCR: 20.0000 meq | EXTENDED_RELEASE_TABLET | Freq: Every day | ORAL | 5 refills | Status: DC

## 2019-07-03 MED ORDER — FUROSEMIDE 40 MG PO TABS: 40.0000 mg | ORAL_TABLET | Freq: Every day | ORAL | 5 refills | Status: DC

## 2019-07-03 NOTE — Telephone Encounter (Signed)
LVM for patient to schedule a New Patient appt for the Heart Failure Clinic after we received an ED referral for her.    Alyse Low, [place]

## 2019-07-03 NOTE — Patient Instructions (Addendum)
Begin weighing daily and call for an overnight weight gain of > 2 pounds or a weekly weight gain of >5 pounds. 

## 2019-07-03 NOTE — Progress Notes (Signed)
Patient ID: [name], female    DOB: [birth year], 69 y.o.   MRN: [id number]  HPI  [name] is a 69 y/o female with a history of CAD, DM, HTN, CKD, stroke, thyroid disease, hyperlipidemia, sleep apnea, anemia and heart failure.   Echo report from 10/26/17 reviewed and showed an EF of 40% along with moderate MR and mild TR.   Was in the ED 06/30/19 due to acute on chronic heart failure. Given IV lasix and released.   She presents today for her initial visit with a chief complaint of minimal fatigue upon moderate exertion. She describes this as being present for several months but has improved since being seen recently in the ED. She has associated dizziness and chronic difficulty sleeping along with this. She denies any abdominal distention, palpitations, pedal edema, chest pain, wheezing, shortness of breath or cough.   She does not have scales at home.   Past Medical History:  Diagnosis Date  . Anemia   . Anemia, iron deficiency 11/07/2013  . Arthritis   . CAD (coronary artery disease)   . Cerebrovascular accident ([place])   . Chicken pox   . Chronic kidney disease   . Colitis    ? at 63  . Diabetes mellitus   . Hashimoto thyroiditis    w subsequent hypothyroidism  . Headache   . HTN (hypertension)   . Hyperlipidemia   . Hypothyroidism   . Myocardial infarction ([place])   . 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 Sleep apnea    Past Surgical History:  Procedure Laterality Date  . ABDOMINAL HYSTERECTOMY    . CHOLECYSTECTOMY    . COLONOSCOPY WITH PROPOFOL N/A 12/24/2014   Procedure: COLONOSCOPY WITH PROPOFOL;  Surgeon: Hulen Luster, MD;  Location: [facility] ENDOSCOPY;  Service: Gastroenterology;  Laterality: N/A;  . CORONARY ARTERY BYPASS GRAFT    . ESOPHAGOGASTRODUODENOSCOPY N/A 09/02/2017   Procedure: ESOPHAGOGASTRODUODENOSCOPY (EGD);  Surgeon: Virgel Manifold, MD;  Location: [facility] ENDOSCOPY;  Service: Endoscopy;  Laterality: N/A;  .  ESOPHAGOGASTRODUODENOSCOPY (EGD) WITH PROPOFOL N/A 12/24/2014   Procedure: ESOPHAGOGASTRODUODENOSCOPY (EGD) WITH PROPOFOL;  Surgeon: Hulen Luster, MD;  Location: [facility] ENDOSCOPY;  Service: Gastroenterology;  Laterality: N/A;  . JOINT REPLACEMENT    . ROUX-EN-Y GASTRIC BYPASS     loss over 100 pounds  . TONSILLECTOMY    . TOTAL KNEE ARTHROPLASTY Left    Family History  Problem Relation Age of Onset  . Coronary artery disease Other   . Stroke Maternal Grandmother   . Diabetes Paternal Grandmother        diabetic coma  . Heart attack Father   . Heart attack Mother   . Heart attack Maternal Grandfather   . Heart attack Paternal Grandfather   . Breast cancer Neg Hx    Social History   Tobacco Use  . Smoking status: Never Smoker  . Smokeless tobacco: Never Used  Substance Use Topics  . Alcohol use: No   Allergies  Allergen Reactions  . Niacin Other (See Comments)  . Ultram [Tramadol Hcl]   . Baclofen Other (See Comments)    Dizziness  . Cardura [Doxazosin Mesylate] Other (See Comments)    Dizzy with passing out spells  . Morphine And Related Nausea And Vomiting  . Other Rash    Gold metal   Prior to Admission medications   Medication Sig Start Date End Date Taking? Authorizing Provider  aspirin EC 81 MG tablet Take 81 mg by  mouth daily.    Yes [provider]  benazepril (LOTENSIN) 40 MG tablet TAKE ONE TABLET BY MOUTH ONCE DAILY 08/24/14  Yes Susy Frizzle, MD  cloNIDine (CATAPRES) 0.1 MG tablet Take 0.1 mg by mouth 2 (two) times daily.   Yes [provider]  furosemide (LASIX) 40 MG tablet Take 1 tablet (40 mg total) by mouth 2 (two) times daily for 3 days. 06/30/19 07/03/19 Yes Duffy Bruce, MD  glimepiride (AMARYL) 1 MG tablet Take 1 tablet by mouth daily. 06/15/17  Yes [provider]  hydrALAZINE (APRESOLINE) 25 MG tablet Take 1 tablet (25 mg total) by mouth 3 (three) times daily. 08/09/13  Yes Susy Frizzle, MD  levothyroxine (SYNTHROID,  LEVOTHROID) 100 MCG tablet Take 1 tablet (100 mcg total) by mouth daily. Patient taking differently: Take 137 mcg by mouth daily.  08/21/13  Yes Susy Frizzle, MD  lovastatin (MEVACOR) 20 MG tablet Take 2 tablets (40 mg total) by mouth at bedtime. 02/06/14  Yes Susy Frizzle, MD  potassium chloride (KLOR-CON) 10 MEQ tablet Take 2 tablets (20 mEq total) by mouth daily for 3 days. 06/30/19 07/03/19 Yes Duffy Bruce, MD  vitamin B-12 (CYANOCOBALAMIN) 1000 MCG tablet Take 1,000 mcg by mouth daily.    [provider]    Review of Systems  Constitutional: Positive for fatigue. Negative for appetite change.  HENT: Negative for congestion, postnasal drip and sore throat.   Eyes: Negative.   Respiratory: Negative for cough and shortness of breath.   Cardiovascular: Negative for chest pain, palpitations and leg swelling.  Gastrointestinal: Negative for abdominal distention and abdominal pain.  Endocrine: Negative.   Genitourinary: Negative.   Musculoskeletal: Negative for back pain and neck pain.  Skin: Negative.   Allergic/Immunologic: Negative.   Neurological: Positive for dizziness. Negative for light-headedness.  Hematological: Negative for adenopathy. Does not bruise/bleed easily.  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 Negative for dysphoric mood. The patient is not nervous/anxious.    Vitals:   07/03/19 1215  BP: (!) 142/72  Pulse: 66  Resp: 16  SpO2: 98%  Weight: 210 lb (95.3 kg)  Height: 5\' 6"  (1.676 m)   Wt Readings from Last 3 Encounters:  07/03/19 210 lb (95.3 kg)  06/30/19 221 lb (100.2 kg)  02/12/19 220 lb (99.8 kg)   Lab Results  Component Value Date   CREATININE 1.50 (H) 06/30/2019   CREATININE 1.62 (H) 02/12/2019   CREATININE 1.60 (H) 11/15/2017     Physical Exam Vitals and nursing note reviewed.  Constitutional:      Appearance: She is well-developed.  HENT:      Head: Normocephalic and atraumatic.  Eyes:     Extraocular Movements: Extraocular movements intact.     Pupils: Pupils are equal, round, and reactive to light.  Neck:     Vascular: No JVD.  Cardiovascular:     Rate and Rhythm: Normal rate and regular rhythm.  Pulmonary:     Effort: Pulmonary effort is normal. No respiratory distress.     Breath sounds: No wheezing, rhonchi or rales.  Abdominal:     Palpations: Abdomen is soft.     Tenderness: There is no abdominal tenderness.  Musculoskeletal:     Cervical back: Normal range of motion and neck supple.     Right lower leg: No tenderness. No edema.     Left lower leg: No tenderness. No edema.  Skin:    General: Skin  is warm and dry.  Neurological:     General: No focal deficit present.     Mental Status: She is alert and oriented to person, place, and time.  Psychiatric:        Mood and Affect: Mood normal.        Behavior: Behavior normal.     Assessment & Plan:  1: Chronic heart failure with mildly reduced ejection fraction- - NYHA class II - euvolemic today - doesn't have scales so a set was given to her today; instructed her to weigh daily after using the bathroom, write the weight down and call for an overnight weight gain of >2 pounds or a weekly weight gain of >5 pounds - not adding salt and tries to eat low sodium; reviewed the importance of following a low sodium diet and written dietary information along with a low sodium cookbook were given to her - echo ordered for 07/18/19 to see if EF has improved or worsened to help guide therapy - denies any shortness of breath now since she's been taking furosemide/ potassium BID; was taking it as needed - will have patient decrease her furosemide/ potassium to just once daily; new RX's sent to patient's pharmacy - patient scheduled to get labs with her PCP in 2 days - BNP 06/30/19 was 3314.0 - has not received her flu vaccine yet  2: HTN- - BP looks good today - saw PCP  [place]) 11/30/2018 - BMP 06/30/19 reviewed and showed sodium 143, potassium 4.3, creatinine 1.5 and GFR 35  3: DM- - checks her glucose level "on occasion" but says that it usually runs "good" - A1c 11/23/2018 was 7.4%  Medication bottles were reviewed.  Return in 1 month or sooner for any questions/problems before then.

## 2019-07-13 ENCOUNTER — Telehealth: Payer: Self-pay | Admitting: Family

## 2019-07-13 NOTE — Telephone Encounter (Signed)
Patient called to say that her left foot has been feeling "very cold" since last night. It feels cold to her even after putting it in warm water or under her heated blanket. Also says that her left shin feels stiff along with some numbness in her right foot.   Does have a history of neuropathy and gout but says that this "feels different" and she says that she can't feel a pulse in her foot.   Advised patient to contact her PCP who she saw yesterday to see if he can see her today as she may need ultrasound done. If he can't see her today and she absolutely can not feel a pulse, she should present to an Urgent Care/ED for further evaluation.   Patient voices understanding and says that she will call her PCP immediately and was comfortable with this plan.

## 2019-07-15 ENCOUNTER — Inpatient Hospital Stay
Admission: EM | Admit: 2019-07-15 | Discharge: 2019-07-19 | DRG: 271 | Disposition: A | Discharge status: Home / Self Care | Payer: Medicare Other | Source: Ambulatory Visit | Attending: Internal Medicine | Admitting: Internal Medicine

## 2019-07-15 ENCOUNTER — Encounter: Payer: Self-pay | Admitting: Intensive Care

## 2019-07-15 ENCOUNTER — Encounter: Payer: Self-pay | Admitting: Emergency Medicine

## 2019-07-15 ENCOUNTER — Other Ambulatory Visit: Payer: Self-pay

## 2019-07-15 ENCOUNTER — Ambulatory Visit
Admission: EM | Admit: 2019-07-15 | Discharge: 2019-07-15 | Disposition: A | Discharge status: Other Acute Inpatient Hospital | Payer: Medicare Other

## 2019-07-15 DIAGNOSIS — M1990 Unspecified osteoarthritis, unspecified site: Secondary | ICD-10-CM | POA: Diagnosis present

## 2019-07-15 DIAGNOSIS — Z888 Allergy status to other drugs, medicaments and biological substances status: Secondary | ICD-10-CM

## 2019-07-15 DIAGNOSIS — E7800 Pure hypercholesterolemia, unspecified: Secondary | ICD-10-CM | POA: Diagnosis present

## 2019-07-15 DIAGNOSIS — I5042 Chronic combined systolic (congestive) and diastolic (congestive) heart failure: Secondary | ICD-10-CM | POA: Diagnosis present

## 2019-07-15 DIAGNOSIS — D631 Anemia in chronic kidney disease: Secondary | ICD-10-CM | POA: Diagnosis present

## 2019-07-15 DIAGNOSIS — I252 Old myocardial infarction: Secondary | ICD-10-CM

## 2019-07-15 DIAGNOSIS — Z885 Allergy status to narcotic agent status: Secondary | ICD-10-CM

## 2019-07-15 DIAGNOSIS — N1832 Chronic kidney disease, stage 3b: Secondary | ICD-10-CM | POA: Diagnosis present

## 2019-07-15 DIAGNOSIS — E1165 Type 2 diabetes mellitus with hyperglycemia: Secondary | ICD-10-CM | POA: Diagnosis present

## 2019-07-15 DIAGNOSIS — E785 Hyperlipidemia, unspecified: Secondary | ICD-10-CM | POA: Diagnosis present

## 2019-07-15 DIAGNOSIS — Z6833 Body mass index (BMI) 33.0-33.9, adult: Secondary | ICD-10-CM

## 2019-07-15 DIAGNOSIS — E1122 Type 2 diabetes mellitus with diabetic chronic kidney disease: Secondary | ICD-10-CM | POA: Diagnosis present

## 2019-07-15 DIAGNOSIS — I70229 Atherosclerosis of native arteries of extremities with rest pain, unspecified extremity: Secondary | ICD-10-CM

## 2019-07-15 DIAGNOSIS — Z8673 Personal history of transient ischemic attack (TIA), and cerebral infarction without residual deficits: Secondary | ICD-10-CM

## 2019-07-15 DIAGNOSIS — E063 Autoimmune thyroiditis: Secondary | ICD-10-CM | POA: Diagnosis present

## 2019-07-15 DIAGNOSIS — Z79899 Other long term (current) drug therapy: Secondary | ICD-10-CM

## 2019-07-15 DIAGNOSIS — Z79890 Hormone replacement therapy (postmenopausal): Secondary | ICD-10-CM | POA: Diagnosis not present

## 2019-07-15 DIAGNOSIS — I70222 Atherosclerosis of native arteries of extremities with rest pain, left leg: Secondary | ICD-10-CM | POA: Diagnosis not present

## 2019-07-15 DIAGNOSIS — E669 Obesity, unspecified: Secondary | ICD-10-CM | POA: Diagnosis present

## 2019-07-15 DIAGNOSIS — Z7982 Long term (current) use of aspirin: Secondary | ICD-10-CM

## 2019-07-15 DIAGNOSIS — E1140 Type 2 diabetes mellitus with diabetic neuropathy, unspecified: Secondary | ICD-10-CM | POA: Diagnosis present

## 2019-07-15 DIAGNOSIS — Z23 Encounter for immunization: Secondary | ICD-10-CM

## 2019-07-15 DIAGNOSIS — E1151 Type 2 diabetes mellitus with diabetic peripheral angiopathy without gangrene: Secondary | ICD-10-CM | POA: Diagnosis present

## 2019-07-15 DIAGNOSIS — Z951 Presence of aortocoronary bypass graft: Secondary | ICD-10-CM

## 2019-07-15 DIAGNOSIS — I130 Hypertensive heart and chronic kidney disease with heart failure and stage 1 through stage 4 chronic kidney disease, or unspecified chronic kidney disease: Secondary | ICD-10-CM | POA: Diagnosis present

## 2019-07-15 DIAGNOSIS — Z20822 Contact with and (suspected) exposure to covid-19: Secondary | ICD-10-CM | POA: Diagnosis present

## 2019-07-15 DIAGNOSIS — I739 Peripheral vascular disease, unspecified: Secondary | ICD-10-CM | POA: Diagnosis not present

## 2019-07-15 DIAGNOSIS — Z8711 Personal history of peptic ulcer disease: Secondary | ICD-10-CM

## 2019-07-15 DIAGNOSIS — I998 Other disorder of circulatory system: Secondary | ICD-10-CM

## 2019-07-15 DIAGNOSIS — Z8249 Family history of ischemic heart disease and other diseases of the circulatory system: Secondary | ICD-10-CM

## 2019-07-15 DIAGNOSIS — I25810 Atherosclerosis of coronary artery bypass graft(s) without angina pectoris: Secondary | ICD-10-CM | POA: Diagnosis not present

## 2019-07-15 DIAGNOSIS — I2510 Atherosclerotic heart disease of native coronary artery without angina pectoris: Secondary | ICD-10-CM | POA: Diagnosis present

## 2019-07-15 DIAGNOSIS — M79672 Pain in left foot: Secondary | ICD-10-CM | POA: Diagnosis not present

## 2019-07-15 DIAGNOSIS — I743 Embolism and thrombosis of arteries of the lower extremities: Secondary | ICD-10-CM | POA: Diagnosis present

## 2019-07-15 DIAGNOSIS — G4730 Sleep apnea, unspecified: Secondary | ICD-10-CM | POA: Diagnosis present

## 2019-07-15 DIAGNOSIS — Z833 Family history of diabetes mellitus: Secondary | ICD-10-CM

## 2019-07-15 DIAGNOSIS — Z9884 Bariatric surgery status: Secondary | ICD-10-CM | POA: Diagnosis not present

## 2019-07-15 DIAGNOSIS — Z823 Family history of stroke: Secondary | ICD-10-CM

## 2019-07-15 HISTORY — DX: Personal history of transient ischemic attack (TIA), and cerebral infarction without residual deficits: Z86.73

## 2019-07-15 LAB — COMPREHENSIVE METABOLIC PANEL
ALT: 14 U/L (ref 0–44)
AST: 27 U/L (ref 15–41)
Albumin: 3.7 g/dL (ref 3.5–5.0)
Alkaline Phosphatase: 118 U/L (ref 38–126)
Anion gap: 8 (ref 5–15)
BUN: 41 mg/dL — ABNORMAL HIGH (ref 8–23)
CO2: 24 mmol/L (ref 22–32)
Calcium: 8.7 mg/dL — ABNORMAL LOW (ref 8.9–10.3)
Chloride: 109 mmol/L (ref 98–111)
Creatinine, Ser: 1.75 mg/dL — ABNORMAL HIGH (ref 0.44–1.00)
GFR calc Af Amer: 34 mL/min — ABNORMAL LOW (ref >60)
GFR calc non Af Amer: 29 mL/min — ABNORMAL LOW (ref >60)
Glucose, Bld: 193 mg/dL — ABNORMAL HIGH (ref 70–99)
Potassium: 4.6 mmol/L (ref 3.5–5.1)
Sodium: 141 mmol/L (ref 135–145)
Total Bilirubin: 0.6 mg/dL (ref 0.3–1.2)
Total Protein: 6.8 g/dL (ref 6.5–8.1)

## 2019-07-15 LAB — GLUCOSE, CAPILLARY: Glucose-Capillary: 137 mg/dL — ABNORMAL HIGH (ref 70–99)

## 2019-07-15 LAB — CBC WITH DIFFERENTIAL/PLATELET
Abs Immature Granulocytes: 0.01 10*3/uL (ref 0.00–0.07)
Basophils Absolute: 0 10*3/uL (ref 0.0–0.1)
Basophils Relative: 1 %
Eosinophils Absolute: 0.2 10*3/uL (ref 0.0–0.5)
Eosinophils Relative: 3 %
HCT: 33.7 % — ABNORMAL LOW (ref 36.0–46.0)
Hemoglobin: 9.7 g/dL — ABNORMAL LOW (ref 12.0–15.0)
Immature Granulocytes: 0 %
Lymphocytes Relative: 24 %
Lymphs Abs: 1.5 10*3/uL (ref 0.7–4.0)
MCH: 22.8 pg — ABNORMAL LOW (ref 26.0–34.0)
MCHC: 28.8 g/dL — ABNORMAL LOW (ref 30.0–36.0)
MCV: 79.1 fL — ABNORMAL LOW (ref 80.0–100.0)
Monocytes Absolute: 0.6 10*3/uL (ref 0.1–1.0)
Monocytes Relative: 10 %
Neutro Abs: 3.9 10*3/uL (ref 1.7–7.7)
Neutrophils Relative %: 62 %
Platelets: 282 10*3/uL (ref 150–400)
RBC: 4.26 MIL/uL (ref 3.87–5.11)
RDW: 16.5 % — ABNORMAL HIGH (ref 11.5–15.5)
WBC: 6.3 10*3/uL (ref 4.0–10.5)
nRBC: 0 % (ref 0.0–0.2)

## 2019-07-15 LAB — PROTIME-INR
INR: 1.1 (ref 0.8–1.2)
Prothrombin Time: 14.4 seconds (ref 11.4–15.2)

## 2019-07-15 LAB — RESPIRATORY PANEL BY RT PCR (FLU A&B, COVID)
Influenza A by PCR: NEGATIVE
Influenza B by PCR: NEGATIVE
SARS Coronavirus 2 by RT PCR: NEGATIVE

## 2019-07-15 LAB — APTT: aPTT: 130 seconds — ABNORMAL HIGH (ref 24–36)

## 2019-07-15 MED ORDER — PRAVASTATIN SODIUM 40 MG PO TABS
40.0000 mg | ORAL_TABLET | Freq: Every day | ORAL | Status: DC
  Administered 2019-07-16 – 2019-07-18 (×3): 40 mg via ORAL
  Filled 2019-07-15 (×3): qty 1

## 2019-07-15 MED ORDER — CLONIDINE HCL 0.1 MG PO TABS
0.1000 mg | ORAL_TABLET | Freq: Two times a day (BID) | ORAL | Status: DC
  Administered 2019-07-15 – 2019-07-19 (×8): 0.1 mg via ORAL
  Filled 2019-07-15 (×8): qty 1

## 2019-07-15 MED ORDER — ACETAMINOPHEN 650 MG RE SUPP: 650.0000 mg | Freq: Four times a day (QID) | RECTAL | Status: DC | PRN

## 2019-07-15 MED ORDER — HEPARIN (PORCINE) 25000 UT/250ML-% IV SOLN
1200.0000 [IU]/h | INTRAVENOUS | Status: DC
  Administered 2019-07-15 – 2019-07-17 (×2): 1350 [IU]/h via INTRAVENOUS
  Filled 2019-07-15 (×3): qty 250

## 2019-07-15 MED ORDER — BENAZEPRIL HCL 20 MG PO TABS
40.0000 mg | ORAL_TABLET | Freq: Every day | ORAL | Status: DC
  Administered 2019-07-16 – 2019-07-19 (×4): 40 mg via ORAL
  Filled 2019-07-15 (×4): qty 2

## 2019-07-15 MED ORDER — ONDANSETRON HCL 4 MG PO TABS
4.0000 mg | ORAL_TABLET | Freq: Four times a day (QID) | ORAL | Status: DC | PRN
  Administered 2019-07-15: 4 mg via ORAL
  Filled 2019-07-15: qty 1

## 2019-07-15 MED ORDER — ONDANSETRON HCL 4 MG/2ML IJ SOLN: 4.0000 mg | Freq: Four times a day (QID) | INTRAMUSCULAR | Status: DC | PRN

## 2019-07-15 MED ORDER — INSULIN ASPART 100 UNIT/ML ~~LOC~~ SOLN
0.0000 [IU] | Freq: Three times a day (TID) | SUBCUTANEOUS | Status: DC
  Administered 2019-07-17: 17:00:00 5 [IU] via SUBCUTANEOUS
  Administered 2019-07-18: 17:00:00 2 [IU] via SUBCUTANEOUS
  Administered 2019-07-18: 12:00:00 3 [IU] via SUBCUTANEOUS
  Administered 2019-07-19: 09:00:00 2 [IU] via SUBCUTANEOUS
  Filled 2019-07-15 (×4): qty 1

## 2019-07-15 MED ORDER — ASPIRIN EC 81 MG PO TBEC
81.0000 mg | DELAYED_RELEASE_TABLET | Freq: Every day | ORAL | Status: DC
  Administered 2019-07-16 – 2019-07-17 (×2): 81 mg via ORAL
  Filled 2019-07-15 (×2): qty 1

## 2019-07-15 MED ORDER — LEVOTHYROXINE SODIUM 137 MCG PO TABS
137.0000 ug | ORAL_TABLET | Freq: Every day | ORAL | Status: DC
  Administered 2019-07-16 – 2019-07-19 (×4): 137 ug via ORAL
  Filled 2019-07-15 (×4): qty 1

## 2019-07-15 MED ORDER — FERROUS SULFATE 325 (65 FE) MG PO TABS
325.0000 mg | ORAL_TABLET | ORAL | Status: DC
  Administered 2019-07-16 – 2019-07-18 (×2): 325 mg via ORAL
  Filled 2019-07-15 (×4): qty 1

## 2019-07-15 MED ORDER — ACETAMINOPHEN 325 MG PO TABS
650.0000 mg | ORAL_TABLET | Freq: Four times a day (QID) | ORAL | Status: DC | PRN
  Administered 2019-07-17 – 2019-07-18 (×2): 650 mg via ORAL
  Filled 2019-07-15 (×2): qty 2

## 2019-07-15 MED ORDER — SODIUM CHLORIDE 0.9 % IV BOLUS: 500.0000 mL | Freq: Once | INTRAVENOUS | Status: DC

## 2019-07-15 MED ORDER — POLYETHYLENE GLYCOL 3350 17 G PO PACK: 17.0000 g | PACK | Freq: Every day | ORAL | Status: DC | PRN

## 2019-07-15 MED ORDER — INFLUENZA VAC A&B SA ADJ QUAD 0.5 ML IM PRSY
0.5000 mL | PREFILLED_SYRINGE | INTRAMUSCULAR | Status: AC
  Administered 2019-07-19: 11:00:00 0.5 mL via INTRAMUSCULAR
  Filled 2019-07-15: qty 0.5

## 2019-07-15 MED ORDER — VITAMIN B-12 1000 MCG PO TABS
1000.0000 ug | ORAL_TABLET | Freq: Every day | ORAL | Status: DC
  Administered 2019-07-16 – 2019-07-19 (×4): 1000 ug via ORAL
  Filled 2019-07-15 (×4): qty 1

## 2019-07-15 MED ORDER — FUROSEMIDE 40 MG PO TABS
40.0000 mg | ORAL_TABLET | Freq: Every day | ORAL | Status: DC
  Administered 2019-07-16 – 2019-07-19 (×4): 40 mg via ORAL
  Filled 2019-07-15 (×4): qty 1

## 2019-07-15 MED ORDER — HYDRALAZINE HCL 50 MG PO TABS
50.0000 mg | ORAL_TABLET | Freq: Three times a day (TID) | ORAL | Status: DC
  Administered 2019-07-15 – 2019-07-19 (×9): 50 mg via ORAL
  Filled 2019-07-15 (×10): qty 1

## 2019-07-15 MED ORDER — POTASSIUM CHLORIDE CRYS ER 10 MEQ PO TBCR
20.0000 meq | EXTENDED_RELEASE_TABLET | Freq: Every day | ORAL | Status: DC
  Administered 2019-07-16 – 2019-07-19 (×4): 20 meq via ORAL
  Filled 2019-07-15 (×4): qty 2

## 2019-07-15 MED ORDER — PANTOPRAZOLE SODIUM 40 MG PO TBEC
40.0000 mg | DELAYED_RELEASE_TABLET | Freq: Every day | ORAL | Status: DC
  Administered 2019-07-15 – 2019-07-19 (×5): 40 mg via ORAL
  Filled 2019-07-15 (×5): qty 1

## 2019-07-15 MED ORDER — HEPARIN BOLUS VIA INFUSION
4800.0000 [IU] | Freq: Once | INTRAVENOUS | Status: AC
  Administered 2019-07-15: 18:00:00 4800 [IU] via INTRAVENOUS
  Filled 2019-07-15: qty 4800

## 2019-07-15 NOTE — Consult Note (Deleted)
error 

## 2019-07-15 NOTE — ED Provider Notes (Signed)
69 year old female with history of CAD, CHF, CKD, DM, hypothyroidism, HTN, HLD comes in for left lower extremity pain, discoloration.  Patient was recently evaluated at the emergency department for acute on chronic CHF, and was given IV Lasix, and discharged on p.o. Lasix and potassium.  She has been taking medications as directed, and has had continued to decrease in weight.  Currently weight is 208, goal is 200.  She was seen by PCP 07/12/2019 with stable evaluation, and was told to continue to monitor.  Since then, has noticed pain to the left lower extremity, particularly to the plantar surface, particularly during ambulation.  She noticed that her left lower extremity has been more cold compared to the right.  And yesterday, started noticing discoloration to the foot.  She has not been able to find her pedal pulse for the past 3 days.    On exam, no swelling, erythema, warmth.  Discoloration to the left great toe, with varicose veins noted. No swelling to the calf. Left foot cold. With pallor during elevation. Pain on palpation of LLE. Unable to find pedal pulse.    Given history and exam, discussed case with Dr Lanny Cramp. Worries for PAD with cyanosis of the left great toe. Sent to ED for further evaluation.  Case discussed with Dr Lanny Cramp, who agrees to plan.    Ok Edwards, PA-C 07/15/19 1443

## 2019-07-15 NOTE — ED Triage Notes (Signed)
Patient sent from urgent care for evaluation due to possible arterial involvement of LLE. C/o left leg pain X3 days with foot discoloration, cold extremity. Urgent care unable to find pedal pulse with discoloration of the left great toe. Pallor notes

## 2019-07-15 NOTE — H&P (Addendum)
History and Physical:    [name]   [id number] DOB: [birth year] DOA: 07/15/2019  Referring MD/provider: Brenton Grills, MD PCP: Kirk Ruths, MD   Patient coming from:   Chief Complaint: Left leg pain  History of Present Illness:   [name] is an 69 y.o. female medical history significant for CAD status post CABG 20 years ago, stroke about 52 years ago, CHF ( recently received IVLasix in ED on 06/30/2019), history of gastric ulcer, iron deficiency anemia, arthritis, diabetes mellitus, hypertension, hypothyroidism, hyperlipidemia, CKD stage 3b, sleep apnea, presented to the hospital because of pain in the left leg and left foot.  The pain started about 2 days ago and it was of sudden onset. It's moderate in severity It is worse with walking or when she stands on her foot but relieved with rest.  It is associated with coldness of the left leg and foot.  She said she could not even walk her lower extremity with an electric blanket.  She had to pour hot water on it but after a while it became cold again. She has no other complaints.  No fever, chills, chest pain, shortness of breath, cough, wheezing, headache, dizziness, vomiting diarrhea, abdominal pain.   ED Course:  The patient's vital signs were unremarkable.  Clinical findings were suggestive of ischemic left foot so IV heparin was ordered.  She was also given 500 mls of IV fluids in the ED.  ROS:   ROS all other systems reviewed were negative  Past Medical History:   Past Medical History:  Diagnosis Date  . Anemia   . Anemia, iron deficiency 11/07/2013  . Arthritis   . CAD (coronary artery disease)   . Cerebrovascular accident ([place])   . CHF (congestive heart failure) ([place])   . Chicken pox   . Chronic kidney disease   . Colitis    ? at 71  . Diabetes mellitus   . Hashimoto thyroiditis    w subsequent hypothyroidism  . Headache   . HTN (hypertension)   . Hyperlipidemia   . Hypothyroidism   .  Myocardial infarction ([place])   . 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 Sleep apnea     Past Surgical History:   Past Surgical History:  Procedure Laterality Date  . ABDOMINAL HYSTERECTOMY    . CHOLECYSTECTOMY    . COLONOSCOPY WITH PROPOFOL N/A 12/24/2014   Procedure: COLONOSCOPY WITH PROPOFOL;  Surgeon: Hulen Luster, MD;  Location[facility] ENDOSCOPY;  Service: Gastroenterology;  Laterality: N/A;  . CORONARY ARTERY BYPASS GRAFT    . ESOPHAGOGASTRODUODENOSCOPY N/A 09/02/2017   Procedure: ESOPHAGOGASTRODUODENOSCOPY (EGD);  Surgeon: Virgel Manifold, MD;  Location: [facility] ENDOSCOPY;  Service: Endoscopy;  Laterality: N/A;  . ESOPHAGOGASTRODUODENOSCOPY (EGD) WITH PROPOFOL N/A 12/24/2014   Procedure: ESOPHAGOGASTRODUODENOSCOPY (EGD) WITH PROPOFOL;  Surgeon: Hulen Luster, MD;  Location: [facility] ENDOSCOPY;  Service: Gastroenterology;  Laterality: N/A;  . JOINT REPLACEMENT    . ROUX-EN-Y GASTRIC BYPASS     loss over 100 pounds  . TONSILLECTOMY    . TOTAL KNEE ARTHROPLASTY Left     Social History:   Social History   Socioeconomic History  . Marital status: Single    Spouse name: Not on file  . Number of children: 0  . Years of education: Not on file  . Highest education level: Not on file  Occupational History  . Occupation: customer service    Employer: [employer]  Tobacco Use  . Smoking  status: Never Smoker  . Smokeless tobacco: Never Used  Substance and Sexual Activity  . Alcohol use: No  . Drug use: No  . Sexual activity: Not on file  Other Topics Concern  . Not on file  Social History Narrative   Single, no children   Customer service   1 caffeine/day   Social Determinants of Health   Financial Resource Strain:   . Difficulty of Paying Living Expenses: Not on file  Food Insecurity:   . Worried About Charity fundraiser in the Last Year: Not on file  . Ran Out of Food in the Last Year: Not on file  Transportation Needs:   . Lack of Transportation  (Medical): Not on file  . Lack of Transportation (Non-Medical): Not on file  Physical Activity:   . Days of Exercise per Week: Not on file  . Minutes of Exercise per Session: Not on file  Stress:   . Feeling of Stress : Not on file  Social Connections:   . Frequency of Communication with Friends and Family: Not on file  . Frequency of Social Gatherings with Friends and Family: Not on file  . Attends Religious Services: Not on file  . Active Member of Clubs or Organizations: Not on file  . Attends Archivist Meetings: Not on file  . Marital Status: Not on file  Intimate Partner Violence:   . Fear of Current or Ex-Partner: Not on file  . Emotionally Abused: Not on file  . Physically Abused: Not on file  . Sexually Abused: Not on file    Allergies   Niacin, Ultram [tramadol hcl], Baclofen, Cardura [doxazosin mesylate], Morphine and related, and Other  Family history:   Family History  Problem Relation Age of Onset  . Coronary artery disease Other   . Stroke Maternal Grandmother   . Diabetes Paternal Grandmother        diabetic coma  . Heart attack Father   . Heart attack Mother   . Heart attack Maternal Grandfather   . Heart attack Paternal Grandfather   . Breast cancer Neg Hx     Current Medications:   Prior to Admission medications   Medication Sig Start Date End Date Taking? Authorizing Provider  aspirin EC 81 MG tablet Take 81 mg by mouth daily.     [provider]  benazepril (LOTENSIN) 40 MG tablet TAKE ONE TABLET BY MOUTH ONCE DAILY 08/24/14   Susy Frizzle, MD  cloNIDine (CATAPRES) 0.1 MG tablet Take 0.1 mg by mouth 2 (two) times daily.    [provider]  Ferrous Sulfate (SLOW FE) 142 (45 Fe) MG TBCR Take 1 tablet by mouth 2 (two) times a week.    [provider]  furosemide (LASIX) 40 MG tablet Take 1 tablet (40 mg total) by mouth daily. 07/03/19   Alisa Graff, FNP  glimepiride (AMARYL) 1 MG tablet Take 1 tablet by  mouth daily. 06/15/17   [provider]  hydrALAZINE (APRESOLINE) 25 MG tablet Take 1 tablet (25 mg total) by mouth 3 (three) times daily. Patient taking differently: Take 50 mg by mouth 3 (three) times daily.  08/09/13   Susy Frizzle, MD  levothyroxine (SYNTHROID, LEVOTHROID) 100 MCG tablet Take 1 tablet (100 mcg total) by mouth daily. Patient taking differently: Take 137 mcg by mouth daily.  08/21/13   Susy Frizzle, MD  lovastatin (MEVACOR) 20 MG tablet Take 2 tablets (40 mg total) by mouth at bedtime. 02/06/14  Susy Frizzle, MD  potassium chloride (KLOR-CON) 10 MEQ tablet Take 2 tablets (20 mEq total) by mouth daily. 07/03/19   Alisa Graff, FNP  vitamin B-12 (CYANOCOBALAMIN) 1000 MCG tablet Take 1,000 mcg by mouth daily.    [provider]    Physical Exam:   Vitals:   07/15/19 1628 07/15/19 1629 07/15/19 2009  BP: (!) 146/58  (!) 160/68  Pulse: 69  70  Resp: 18    Temp: 98.7 F (37.1 C)  99.8 F (37.7 C)  TempSrc: Oral  Oral  SpO2: 97%  100%  Weight:  94.3 kg 93.4 kg  Height:  5\' 6"  (1.676 m) 5\' 6"  (1.676 m)     Physical Exam: Blood pressure (!) 160/68, pulse 70, temperature 99.8 F (37.7 C), temperature source Oral, resp. rate 18, height 5\' 6"  (1.676 m), weight 93.4 kg, SpO2 100 %. Gen: No acute distress. Head: Normocephalic, atraumatic. Eyes: Pupils equal, round and reactive to light. Extraocular movements intact.  Sclerae nonicteric.  Mouth: Moist mucous membranes Neck: Supple, no thyromegaly, no lymphadenopathy, no jugular venous distention. Chest: Lungs are clear to auscultation with good air movement. No rales, rhonchi or wheezes. CV: Heart sounds are regular with an S1, S2. No murmurs, rubs or gallops.  Abdomen: Soft, nontender, nondistended with normal active bowel sounds. No palpable masses. Extremities: Extremities are without clubbing, or cyanosis. No edema.  I could not feel any pulses in the left dorsalis pedis artery, left  posterior tibial artery and left popliteal artery.  She has 2+ pulse in the left femoral artery.  2+ pulse in the right dorsalis pedis artery.  Left leg and foot foot are pale, slightly cold and tender.  No erythema noted. Skin: Warm and dry but left leg and foot are cold. No rashes, lesions or wounds Neuro: Alert and oriented times 3; grossly nonfocal.  Psych: Insight is good and judgment is appropriate. Mood and affect normal.   Data Review:    Labs: Basic Metabolic Panel: Recent Labs  Lab 07/15/19 1632  NA 141  K 4.6  CL 109  CO2 24  GLUCOSE 193*  BUN 41*  CREATININE 1.75*  CALCIUM 8.7*   Liver Function Tests: Recent Labs  Lab 07/15/19 1632  AST 27  ALT 14  ALKPHOS 118  BILITOT 0.6  PROT 6.8  ALBUMIN 3.7   No results for input(s): LIPASE, AMYLASE in the last 168 hours. No results for input(s): AMMONIA in the last 168 hours. CBC: Recent Labs  Lab 07/15/19 1708  WBC 6.3  NEUTROABS 3.9  HGB 9.7*  HCT 33.7*  MCV 79.1*  PLT 282   Cardiac Enzymes: No results for input(s): CKTOTAL, CKMB, CKMBINDEX, TROPONINI in the last 168 hours.  BNP (last 3 results) No results for input(s): PROBNP in the last 8760 hours. CBG: No results for input(s): GLUCAP in the last 168 hours.  Urinalysis    Component Value Date/Time   COLORURINE YELLOW (A) 02/12/2019 1719   APPEARANCEUR HAZY (A) 02/12/2019 1719   LABSPEC 1.016 02/12/2019 1719   PHURINE 5.0 02/12/2019 1719   GLUCOSEU NEGATIVE 02/12/2019 1719   HGBUR NEGATIVE 02/12/2019 1719   BILIRUBINUR NEGATIVE 02/12/2019 1719   KETONESUR NEGATIVE 02/12/2019 1719   PROTEINUR 30 (A) 02/12/2019 1719   UROBILINOGEN 0.2 07/25/2007 0910   NITRITE POSITIVE (A) 02/12/2019 1719   LEUKOCYTESUR TRACE (A) 02/12/2019 1719      Radiographic Studies: No results found.     Assessment/Plan:   Active Problems:  CAD, ARTERY BYPASS GRAFT   Ischemic foot   H/O: stroke  Peripheral vascular disease with probable ischemic left  leg/foot: Admit to telemetry.  Arterial duplex scan has been ordered for further evaluation.  Treat with aspirin and IV heparin infusion.  Monitor PTT per protocol.  ED physician, Dr. Joni Fears, has already consulted vascular surgeon on call, Dr. Trula Slade.  Type 2 diabetes mellitus with hyperglycemia: Hold glimepiride.  Treat with NovoLog as needed.  CAD s/p CABG and history of stroke: Continue aspirin and statin  Chronic CHF: Specifics unknown.  Compensated.  Hold Lasix for now.  Hypertension: Continue antihypertensives  CKD stage IIIb: Creatinine stable.  History of gastric ulcer: Start Protonix for GI prophylaxis because she will be on aspirin and IV heparin.  She is on Synthroid and ferrous sulfate for hypothyroidism Iron deficiency anemia respectively.    Body mass index is 33.22 kg/m. (obesity)  Other information:   DVT prophylaxis: IV heparin Code Status: Full code. Family Communication: Plan discussed with the patient Disposition Plan: To be determined Consults called: Vascular surgeon Admission status: Inpatient  The medical decision making on this patient was of high complexity and the patient is at high risk for clinical deterioration, therefore this is a level 3 visit.   Time spent 60 minutes  [facility]   How to contact the [facility] Attending or Consulting provider [place] or covering provider during after hours [place], for this patient?   1. Check the care team in [facility] and look for a) attending/consulting [facility] provider listed and b) the [facility] team listed 2. Log into [email or web address] and use [place]'s universal password to access. If you do not have the password, please contact the hospital operator. 3. Locate the [facility] provider you are looking for under [facility] and page to a number that you can be directly reached. 4. If you still have difficulty reaching the provider, please page the [facility] (Director on Call) for the Hospitalists listed on  amion for assistance.  07/15/2019, 9:23 PM

## 2019-07-15 NOTE — Progress Notes (Signed)
ANTICOAGULATION CONSULT NOTE - Initial Consult  Pharmacy Consult for Heparin Drip Indication: Limb Ischemia  Allergies  Allergen Reactions  . Niacin Other (See Comments)  . Ultram [Tramadol Hcl]   . Baclofen Other (See Comments)    Dizziness  . Cardura [Doxazosin Mesylate] Other (See Comments)    Dizzy with passing out spells  . Morphine And Related Nausea And Vomiting  . Other Rash    Gold metal    Patient Measurements: Height: 5\' 6"  (167.6 cm) Weight: 208 lb (94.3 kg) IBW/kg (Calculated) : 59.3 Heparin Dosing Weight: 80.2 kg  Vital Signs: Temp: 98.7 F (37.1 C) (02/06 1628) Temp Source: Oral (02/06 1628) BP: 146/58 (02/06 1628) Pulse Rate: 69 (02/06 1628)  Labs: Recent Labs    07/15/19 1632 07/15/19 1708  HGB  --  9.7*  HCT  --  33.7*  PLT  --  282  CREATININE 1.75*  --     Estimated Creatinine Clearance: 34.6 mL/min (A) (by C-G formula based on SCr of 1.75 mg/dL (H)).   Medical History: Past Medical History:  Diagnosis Date  . Anemia   . Anemia, iron deficiency 11/07/2013  . Arthritis   . CAD (coronary artery disease)   . Cerebrovascular accident ([place])   . CHF (congestive heart failure) ([place])   . Chicken pox   . Chronic kidney disease   . Colitis    ? at 79  . Diabetes mellitus   . Hashimoto thyroiditis    w subsequent hypothyroidism  . Headache   . HTN (hypertension)   . Hyperlipidemia   . Hypothyroidism   . Myocardial infarction ([place])   . 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 Sleep apnea     Assessment: Patient is a 69yo female admitted for possible arterial involvement of LLE. Patient with pain, discoloration and cold extremity. Pharmacy consulted for Heparin dosing.  Goal of Therapy:  Heparin level 0.3-0.7 units/ml Monitor platelets by anticoagulation protocol: Yes   Plan:  Give 4800 units bolus x 1 Start heparin infusion at 1350 units/hr Check anti-Xa level in 8 hours and daily while on  heparin Continue to monitor H&H and platelets  Paulina Fusi, PharmD, BCPS 07/15/2019 6:00 PM

## 2019-07-15 NOTE — ED Triage Notes (Signed)
Pt here for left leg and foot pain; pt recently had IV lasix and was wondering if her electrolytes could be affected; unable to palpate pedal pulse and some discoloration to left heel; PA aware

## 2019-07-15 NOTE — ED Provider Notes (Signed)
[facility] Emergency Department Provider Note  ____________________________________________  Time seen: Approximately 5:18 PM  I have reviewed the triage vital signs and the nursing notes.   HISTORY  Chief Complaint Leg Pain (left)    HPI [name] is a 69 y.o. female with a history of hypertension diabetes CKD CAD status post CABG who comes the ED complaining of left lower extremity pain and numbness for the past 2 days, constant, worse with walking, no alleviating factors.  Pain is nonradiating.  Mild to moderate intensity, associated with coldness and pallor of the left foot.  Denies trauma      Past Medical History:  Diagnosis Date  . Anemia   . Anemia, iron deficiency 11/07/2013  . Arthritis   . CAD (coronary artery disease)   . Cerebrovascular accident ([place])   . CHF (congestive heart failure) ([place])   . Chicken pox   . Chronic kidney disease   . Colitis    ? at 49  . Diabetes mellitus   . Hashimoto thyroiditis    w subsequent hypothyroidism  . Headache   . HTN (hypertension)   . Hyperlipidemia   . Hypothyroidism   . Myocardial infarction ([place])   . 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 Sleep apnea      Patient Active Problem List   Diagnosis Date Noted  . Ischemic foot 07/15/2019  . Lesion of esophagus   . Acute gastric ulcer without hemorrhage or perforation   . Anemia 09/01/2017  . DOE (dyspnea on exertion) 09/01/2017  . Chronic insomnia 01/26/2017  . Postmenopausal 08/05/2016  . Low TSH level 01/12/2016  . Coronary artery disease with hx of myocardial infarct w/o hx of CABG 05/21/2015  . Chronic kidney disease, stage III (moderate) 10/16/2014  . Acquired hypothyroidism 08/06/2014  . Type 2 diabetes mellitus without complication ([place]) [id number]  . Anemia, iron deficiency 11/07/2013  . 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 Heme + stool 11/07/2013  . Feces  contents abnormal 11/07/2013  . HYPERTENSION, BENIGN 11/10/2008  . CAD, ARTERY BYPASS GRAFT 11/10/2008     Past Surgical History:  Procedure Laterality Date  . ABDOMINAL HYSTERECTOMY    . CHOLECYSTECTOMY    . COLONOSCOPY WITH PROPOFOL N/A 12/24/2014   Procedure: COLONOSCOPY WITH PROPOFOL;  Surgeon: Hulen Luster, MD;  Location: [facility] ENDOSCOPY;  Service: Gastroenterology;  Laterality: N/A;  . CORONARY ARTERY BYPASS GRAFT    . ESOPHAGOGASTRODUODENOSCOPY N/A 09/02/2017   Procedure: ESOPHAGOGASTRODUODENOSCOPY (EGD);  Surgeon: Virgel Manifold, MD;  Location: [facility] ENDOSCOPY;  Service: Endoscopy;  Laterality: N/A;  . ESOPHAGOGASTRODUODENOSCOPY (EGD) WITH PROPOFOL N/A 12/24/2014   Procedure: ESOPHAGOGASTRODUODENOSCOPY (EGD) WITH PROPOFOL;  Surgeon: Hulen Luster, MD;  Location: [facility] ENDOSCOPY;  Service: Gastroenterology;  Laterality: N/A;  . JOINT REPLACEMENT    . ROUX-EN-Y GASTRIC BYPASS     loss over 100 pounds  . TONSILLECTOMY    . TOTAL KNEE ARTHROPLASTY Left      Prior to Admission medications   Medication Sig Start Date End Date Taking? Authorizing Provider  aspirin EC 81 MG tablet Take 81 mg by mouth daily.     [provider]  benazepril (LOTENSIN) 40 MG tablet TAKE ONE TABLET BY MOUTH ONCE DAILY 08/24/14   Susy Frizzle, MD  cloNIDine (CATAPRES) 0.1 MG tablet Take 0.1 mg by mouth 2 (two) times daily.    [provider]  Ferrous Sulfate (SLOW FE) 142 (45 Fe) MG TBCR  Take 1 tablet by mouth 2 (two) times a week.    [provider]  furosemide (LASIX) 40 MG tablet Take 1 tablet (40 mg total) by mouth daily. 07/03/19   Alisa Graff, FNP  glimepiride (AMARYL) 1 MG tablet Take 1 tablet by mouth daily. 06/15/17   [provider]  hydrALAZINE (APRESOLINE) 25 MG tablet Take 1 tablet (25 mg total) by mouth 3 (three) times daily. Patient taking differently: Take 50 mg by mouth 3 (three) times daily.  08/09/13   Susy Frizzle, MD  levothyroxine (SYNTHROID,  LEVOTHROID) 100 MCG tablet Take 1 tablet (100 mcg total) by mouth daily. Patient taking differently: Take 137 mcg by mouth daily.  08/21/13   Susy Frizzle, MD  lovastatin (MEVACOR) 20 MG tablet Take 2 tablets (40 mg total) by mouth at bedtime. 02/06/14   Susy Frizzle, MD  potassium chloride (KLOR-CON) 10 MEQ tablet Take 2 tablets (20 mEq total) by mouth daily. 07/03/19   Alisa Graff, FNP  vitamin B-12 (CYANOCOBALAMIN) 1000 MCG tablet Take 1,000 mcg by mouth daily.    [provider]     Allergies Niacin, Ultram [tramadol hcl], Baclofen, Cardura [doxazosin mesylate], Morphine and related, and Other   Family History  Problem Relation Age of Onset  . Coronary artery disease Other   . Stroke Maternal Grandmother   . Diabetes Paternal Grandmother        diabetic coma  . Heart attack Father   . Heart attack Mother   . Heart attack Maternal Grandfather   . Heart attack Paternal Grandfather   . Breast cancer Neg Hx     Social History Social History   Tobacco Use  . Smoking status: Never Smoker  . Smokeless tobacco: Never Used  Substance Use Topics  . Alcohol use: No  . Drug use: No    Review of Systems  Constitutional:   No fever or chills.  ENT:   No sore throat. No rhinorrhea. Cardiovascular:   No chest pain or syncope.  No palpitations Respiratory:   No dyspnea or cough. Gastrointestinal:   Negative for abdominal pain, vomiting and diarrhea.  Musculoskeletal:   Left leg pain, pallor, and coldness as above All other systems reviewed and are negative except as documented above in ROS and HPI.  ____________________________________________   PHYSICAL EXAM:  VITAL SIGNS: ED Triage Vitals  Enc Vitals Group     BP 07/15/19 1628 (!) 146/58     Pulse Rate 07/15/19 1628 69     Resp 07/15/19 1628 18     Temp 07/15/19 1628 98.7 F (37.1 C)     Temp Source 07/15/19 1628 Oral     SpO2 07/15/19 1628 97 %     Weight 07/15/19 1629 208 lb (94.3 kg)     Height  07/15/19 1629 5\' 6"  (1.676 m)     Head Circumference --      Peak Flow --      Pain Score 07/15/19 1628 3     Pain Loc --      Pain Edu? --      Excl. in [place]? --     Vital signs reviewed, nursing assessments reviewed.   Constitutional:   Alert and oriented. Non-toxic appearance. Eyes:   Conjunctivae are normal. EOMI. PERRL. ENT      Head:   Normocephalic and atraumatic.      Nose:   Wearing a mask.      Mouth/Throat:   Wearing a  mask.      Neck:   No meningismus. Full ROM. Hematological/Lymphatic/Immunilogical:   No cervical lymphadenopathy. Cardiovascular:   RRR. Symmetric bilateral radial pulses.  Normal right DP pulse, nonpalpable left PT and DP pulse.  No murmurs. Cap refill on left side toes and plantar foot about 4 seconds. There is a biphasic Doppler pulse of the left PT artery. There is a monophasic Doppler pulse of the left DP artery.  Respiratory:   Normal respiratory effort without tachypnea/retractions. Breath sounds are clear and equal bilaterally. No wheezes/rales/rhonchi. Gastrointestinal:   Soft and nontender. Non distended.  No rebound, rigidity, or guarding.  Musculoskeletal:   Normal range of motion in all extremities. No joint effusions.  No lower extremity tenderness.  No edema. Neurologic:   Normal speech and language.  Motor grossly intact. Diminished sensation of left foot and toes No acute focal neurologic deficits are appreciated.  Skin:   Left foot is cool to the touch and slightly pale with purplish discoloration of the toes.  No wounds.  No rash.  Right foot is normal.  Skin exam otherwise unremarkable.  ____________________________________________    LABS (pertinent positives/negatives) (all labs ordered are listed, but only abnormal results are displayed) Labs Reviewed  COMPREHENSIVE METABOLIC PANEL - Abnormal; Notable for the following components:      Result Value   Glucose, Bld 193 (*)    BUN 41 (*)    Creatinine, Ser 1.75 (*)    Calcium  8.7 (*)    GFR calc non Af Amer 29 (*)    GFR calc Af Amer 34 (*)    All other components within normal limits  CBC WITH DIFFERENTIAL/PLATELET - Abnormal; Notable for the following components:   Hemoglobin 9.7 (*)    HCT 33.7 (*)    MCV 79.1 (*)    MCH 22.8 (*)    MCHC 28.8 (*)    RDW 16.5 (*)    All other components within normal limits  RESPIRATORY PANEL BY RT PCR (FLU A&B, COVID)  CBC WITH DIFFERENTIAL/PLATELET  HEPARIN LEVEL (UNFRACTIONATED)  CBC   ____________________________________________   EKG  Interpreted by me Sinus rhythm rate of 70, left axis, first-degree AV block.  Poor R wave progression.  Normal ST segments and T waves.  ____________________________________________    RADIOLOGY  No results found.  ____________________________________________   PROCEDURES .Critical Care Performed by: Carrie Mew, MD Authorized by: Carrie Mew, MD   Critical care provider statement:    Critical care time (minutes):  35   Critical care time was exclusive of:  Separately billable procedures and treating other patients   Critical care was necessary to treat or prevent imminent or life-threatening deterioration of the following conditions:  Circulatory failure   Critical care was time spent personally by me on the following activities:  Development of treatment plan with patient or surrogate, discussions with consultants, evaluation of patient's response to treatment, examination of patient, obtaining history from patient or surrogate, ordering and performing treatments and interventions, ordering and review of laboratory studies, ordering and review of radiographic studies, pulse oximetry, re-evaluation of patient's condition and review of old charts    ____________________________________________    CLINICAL IMPRESSION / ASSESSMENT AND PLAN / ED COURSE  Medications ordered in the ED: Medications  sodium chloride 0.9 % bolus 500 mL (has no administration  in time range)  heparin ADULT infusion 100 units/mL (25000 units/223mL sodium chloride 0.45%) (1,350 Units/hr Intravenous New Bag/Given 07/15/19 1742)  heparin bolus via infusion 4,800  Units (4,800 Units Intravenous Bolus from Bag 07/15/19 1742)    Pertinent labs & imaging results that were available during my care of the patient were reviewed by me and considered in my medical decision making (see chart for details).  [name] was evaluated in Emergency Department on 07/15/2019 for the symptoms described in the history of present illness. She was evaluated in the context of the global COVID-19 pandemic, which necessitated consideration that the patient might be at risk for infection with the SARS-CoV-2 virus that causes COVID-19. Institutional protocols and algorithms that pertain to the evaluation of patients at risk for COVID-19 are in a state of rapid change based on information released by regulatory bodies including the CDC and federal and state organizations. These policies and algorithms were followed during the patient's care in the ED.   Patient presents with coldness, pallor, numbness, pain of the left foot with diminished pulses and prolonged capillary refill, concerning for arterial occlusion and ischemic limb.  Discussed with vascular surgery Dr. Trula Slade who agrees with starting heparin and hospitalization for hydration and medical optimization in anticipation of angiogram and revascularization procedure.  Does not feel that doing a CT angiogram would be helpful at this point.      ____________________________________________   FINAL CLINICAL IMPRESSION(S) / ED DIAGNOSES    Final diagnoses:  Critical lower limb ischemia  PAD (peripheral artery disease) ([place])  Type 2 diabetes mellitus with diabetic peripheral angiopathy without gangrene, without long-term current use of insulin [facility])     ED Discharge Orders    None      Portions of this note were generated with dragon  dictation software. Dictation errors may occur despite best attempts at proofreading.   Carrie Mew, MD 07/15/19 [phone]

## 2019-07-15 NOTE — Discharge Instructions (Signed)
69 year old female with recent acute on chronic CHF exacerbation, comes in for 3 day history of left leg pain, with foot discoloration, cold extremity to the left. Unable to find pedal pulse with discoloration of the left great toe. Pallor noted.   Discussed case with Dr Lanny Cramp, who suggested ED evaluation due to possible arterial involvement of LLE.

## 2019-07-15 NOTE — ED Notes (Signed)
Patient is being discharged from the Urgent [place] and sent to the Emergency Department via POV. Per AY, patient is stable but in need of higher level of care due to absent pedal pulse on left foot. Patient is aware and verbalizes understanding of plan of care.  Vitals:   07/15/19 1404  BP: (!) 141/69  Pulse: 79  Resp: 18  Temp: 97.9 F (36.6 C)  SpO2: 96%

## 2019-07-16 DIAGNOSIS — I739 Peripheral vascular disease, unspecified: Secondary | ICD-10-CM

## 2019-07-16 LAB — BASIC METABOLIC PANEL
Anion gap: 7 (ref 5–15)
BUN: 41 mg/dL — ABNORMAL HIGH (ref 8–23)
CO2: 24 mmol/L (ref 22–32)
Calcium: 8.3 mg/dL — ABNORMAL LOW (ref 8.9–10.3)
Chloride: 111 mmol/L (ref 98–111)
Creatinine, Ser: 1.75 mg/dL — ABNORMAL HIGH (ref 0.44–1.00)
GFR calc Af Amer: 34 mL/min — ABNORMAL LOW (ref >60)
GFR calc non Af Amer: 29 mL/min — ABNORMAL LOW (ref >60)
Glucose, Bld: 135 mg/dL — ABNORMAL HIGH (ref 70–99)
Potassium: 4.7 mmol/L (ref 3.5–5.1)
Sodium: 142 mmol/L (ref 135–145)

## 2019-07-16 LAB — CBC
HCT: 29.4 % — ABNORMAL LOW (ref 36.0–46.0)
Hemoglobin: 8.3 g/dL — ABNORMAL LOW (ref 12.0–15.0)
MCH: 22.5 pg — ABNORMAL LOW (ref 26.0–34.0)
MCHC: 28.2 g/dL — ABNORMAL LOW (ref 30.0–36.0)
MCV: 79.7 fL — ABNORMAL LOW (ref 80.0–100.0)
Platelets: 234 10*3/uL (ref 150–400)
RBC: 3.69 MIL/uL — ABNORMAL LOW (ref 3.87–5.11)
RDW: 16.4 % — ABNORMAL HIGH (ref 11.5–15.5)
WBC: 6.1 10*3/uL (ref 4.0–10.5)
nRBC: 0 % (ref 0.0–0.2)

## 2019-07-16 LAB — GLUCOSE, CAPILLARY
Glucose-Capillary: 94 mg/dL (ref 70–99)
Glucose-Capillary: 117 mg/dL — ABNORMAL HIGH (ref 70–99)
Glucose-Capillary: 124 mg/dL — ABNORMAL HIGH (ref 70–99)
Glucose-Capillary: 98 mg/dL (ref 70–99)

## 2019-07-16 LAB — HEPARIN LEVEL (UNFRACTIONATED)
Heparin Unfractionated: 0.51 IU/mL (ref 0.30–0.70)
Heparin Unfractionated: 0.51 IU/mL (ref 0.30–0.70)

## 2019-07-16 LAB — HIV ANTIBODY (ROUTINE TESTING W REFLEX): HIV Screen 4th Generation wRfx: NONREACTIVE

## 2019-07-16 NOTE — Progress Notes (Signed)
Full note to follow Plan for angiogram tomorrow NPO after midnight Continue heparin   Wells Cadyn Rodger

## 2019-07-16 NOTE — Progress Notes (Signed)
Progress Note    [name]  [id number] DOB: September 13, 1950  DOA: 07/15/2019 PCP: Kirk Ruths, MD      Brief Narrative:    Medical records reviewed and are as summarized below:  [name] is an 69 y.o. female with medical history significant for CAD status post CABG 20 years ago, stroke about 20 years ago, CHF ( recently received IVLasix in ED on 06/30/2019), history of gastric ulcer, iron deficiency anemia, arthritis, diabetes mellitus, hypertension, hypothyroidism, hyperlipidemia, CKD stage 3b, sleep apnea, presented to the hospital because of pain in the left leg and left foot.  The pain started about 2 days ago and it was of sudden onset. It's moderate in severity It is worse with walking or when she stands on her foot but relieved with rest.  It is associated with coldness of the left leg and foot.  She said she could not even walk her lower extremity with an electric blanket.  She had to pour hot water on it but after a while it became cold again.      Assessment/Plan:   Active Problems:   CAD, ARTERY BYPASS GRAFT   Ischemic foot   H/O: stroke  Peripheral vascular disease with probable ischemic left leg/foot:  Arterial duplex scan has been ordered for further evaluation.  Continue aspirin and IV heparin infusion.  Monitor PTT per protocol.    Patient has been seen by the vascular surgeon, Dr. Trula Slade, and plan for angiogram tomorrow is noted.   Type 2 diabetes mellitus with hyperglycemia: Hold glimepiride.  Treat with NovoLog as needed.  CAD s/p CABG and history of stroke: Continue aspirin and statin  Chronic CHF: Specifics unknown.  Compensated.  Hold Lasix for now.  Check 2D echo for further evaluation given recent CHF exacerbation on 06/30/2019  Hypertension: Continue antihypertensives  CKD stage IIIb: Creatinine stable.  History of gastric ulcer (2 years ago): Continue Protonix for GI prophylaxis   She is on Synthroid and ferrous sulfate  for hypothyroidism Iron deficiency anemia respectively.    Body mass index is 33.22 kg/m.   Family Communication/Anticipated D/C date and plan/Code Status   DVT prophylaxis: IV heparin Code Status: Full code Family Communication: Plan discussed with patient Disposition Plan: To be determined      Subjective:   She complains of tingling in the toes of the left foot.  Pain in the left leg and foot is better.  She said the left lower extremity feels warm to touch.  Objective:    Vitals:   07/15/19 2337 07/16/19 0206 07/16/19 0436 07/16/19 0743  BP: (!) 143/56  (!) 120/59 (!) 125/54  Pulse: 81  63 70  Resp: 19  19 18   Temp: 98.3 F (36.8 C)  98.2 F (36.8 C) 98.1 F (36.7 C)  TempSrc: Oral  Oral Oral  SpO2: 98%  96% 96%  Weight:  93.4 kg    Height:        Intake/Output Summary (Last 24 hours) at 07/16/2019 1246 Last data filed at 07/16/2019 0813 Gross per 24 hour  Intake 169.07 ml  Output 450 ml  Net -280.93 ml   Filed Weights   07/15/19 1629 07/15/19 2009 07/16/19 0206  Weight: 94.3 kg 93.4 kg 93.4 kg    Exam:  GEN: NAD SKIN: No rash EYES: EOMI ENT: MMM CV: RRR PULM: CTA B ABD: soft, obese, NT, +BS CNS: AAO x 3, non focal EXT: No edema or tenderness.  Left leg  and left foot feel warm to touch compared to yesterday.  No pulse palpable in the left dorsalis pedis artery   Data Reviewed:   I have personally reviewed following labs and imaging studies:  Labs: Labs show the following:   Basic Metabolic Panel: Recent Labs  Lab 07/15/19 1632 07/16/19 0157  NA 141 142  K 4.6 4.7  CL 109 111  CO2 24 24  GLUCOSE 193* 135*  BUN 41* 41*  CREATININE 1.75* 1.75*  CALCIUM 8.7* 8.3*   GFR Estimated Creatinine Clearance: 34.4 mL/min (A) (by C-G formula based on SCr of 1.75 mg/dL (H)). Liver Function Tests: Recent Labs  Lab 07/15/19 1632  AST 27  ALT 14  ALKPHOS 118  BILITOT 0.6  PROT 6.8  ALBUMIN 3.7   No results for input(s): LIPASE, AMYLASE  in the last 168 hours. No results for input(s): AMMONIA in the last 168 hours. Coagulation profile Recent Labs  Lab 07/15/19 2020  INR 1.1    CBC: Recent Labs  Lab 07/15/19 1708 07/16/19 0157  WBC 6.3 6.1  NEUTROABS 3.9  --   HGB 9.7* 8.3*  HCT 33.7* 29.4*  MCV 79.1* 79.7*  PLT 282 234   Cardiac Enzymes: No results for input(s): CKTOTAL, CKMB, CKMBINDEX, TROPONINI in the last 168 hours. BNP (last 3 results) No results for input(s): PROBNP in the last 8760 hours. CBG: Recent Labs  Lab 07/15/19 2341 07/16/19 0745  GLUCAP 137* 94   D-Dimer: No results for input(s): DDIMER in the last 72 hours. Hgb A1c: No results for input(s): HGBA1C in the last 72 hours. Lipid Profile: No results for input(s): CHOL, HDL, LDLCALC, TRIG, CHOLHDL, LDLDIRECT in the last 72 hours. Thyroid function studies: No results for input(s): TSH, T4TOTAL, T3FREE, THYROIDAB in the last 72 hours.  Invalid input(s): FREET3 Anemia work up: No results for input(s): VITAMINB12, FOLATE, FERRITIN, TIBC, IRON, RETICCTPCT in the last 72 hours. Sepsis Labs: Recent Labs  Lab 07/15/19 1708 07/16/19 0157  WBC 6.3 6.1    Microbiology Recent Results (from the past 240 hour(s))  Respiratory Panel by RT PCR (Flu A&B, Covid) - Nasopharyngeal Swab     Status: None   Collection Time: 07/15/19  5:14 PM   Specimen: Nasopharyngeal Swab  Result Value Ref Range Status   SARS Coronavirus 2 by RT PCR NEGATIVE NEGATIVE Final    Comment: (NOTE) SARS-CoV-2 target nucleic acids are NOT DETECTED. The SARS-CoV-2 RNA is generally detectable in upper respiratoy specimens during the acute phase of infection. The lowest concentration of SARS-CoV-2 viral copies this assay can detect is 131 copies/mL. A negative result does not preclude SARS-Cov-2 infection and should not be used as the sole basis for treatment or other patient management decisions. A negative result may occur with  improper specimen collection/handling,  submission of specimen other than nasopharyngeal swab, presence of viral mutation(s) within the areas targeted by this assay, and inadequate number of viral copies (<131 copies/mL). A negative result must be combined with clinical observations, patient history, and epidemiological information. The expected result is Negative. Fact Sheet for Patients:  PinkCheek.be Fact Sheet for Healthcare Providers:  GravelBags.it This test is not yet ap proved or cleared by the [place] FDA and  has been authorized for detection and/or diagnosis of SARS-CoV-2 by FDA under an Emergency Use Authorization (EUA). This EUA will remain  in effect (meaning this test can be used) for the duration of the COVID-19 declaration under Section 564(b)(1) of the Act, 21 U.S.C. section 360bbb-3(b)(1),  unless the authorization is terminated or revoked sooner.    Influenza A by PCR NEGATIVE NEGATIVE Final   Influenza B by PCR NEGATIVE NEGATIVE Final    Comment: (NOTE) The Xpert Xpress SARS-CoV-2/FLU/RSV assay is intended as an aid in  the diagnosis of influenza from Nasopharyngeal swab specimens and  should not be used as a sole basis for treatment. Nasal washings and  aspirates are unacceptable for Xpert Xpress SARS-CoV-2/FLU/RSV  testing. Fact Sheet for Patients: PinkCheek.be Fact Sheet for Healthcare Providers: GravelBags.it This test is not yet approved or cleared by the [place] FDA and  has been authorized for detection and/or diagnosis of SARS-CoV-2 by  FDA under an Emergency Use Authorization (EUA). This EUA will remain  in effect (meaning this test can be used) for the duration of the  Covid-19 declaration under Section 564(b)(1) of the Act, 21  U.S.C. section 360bbb-3(b)(1), unless the authorization is  terminated or revoked. Performed at [facility], [address] [place], [place] [address]     Procedures and diagnostic studies:  No results found.  Medications:   . aspirin EC  81 mg Oral Daily  . benazepril  40 mg Oral Daily  . cloNIDine  0.1 mg Oral BID  . ferrous sulfate  325 mg Oral QODAY  . furosemide  40 mg Oral Daily  . hydrALAZINE  50 mg Oral TID  . influenza vaccine adjuvanted  0.5 mL Intramuscular Tomorrow-1000  . insulin aspart  0-15 Units Subcutaneous TID WC  . levothyroxine  137 mcg Oral Q0600  . pantoprazole  40 mg Oral Daily  . potassium chloride  20 mEq Oral Daily  . pravastatin  40 mg Oral q1800  . vitamin B-12  1,000 mcg Oral Daily   Continuous Infusions: . heparin 1,350 Units/hr (07/16/19 0300)  . sodium chloride       LOS: 1 day   Alaysha Jefcoat  [facility]     07/16/2019, 12:46 PM

## 2019-07-16 NOTE — Progress Notes (Signed)
ANTICOAGULATION CONSULT NOTE - Initial Consult  Pharmacy Consult for Heparin Drip Indication: Limb Ischemia  Allergies  Allergen Reactions  . Niacin Other (See Comments)  . Ultram [Tramadol Hcl]     Intolerance, makes woozy  . Baclofen Other (See Comments)    Dizziness  . Cardura [Doxazosin Mesylate] Other (See Comments)    Dizzy with passing out spells  . Morphine And Related Nausea And Vomiting  . Other Rash    Gold metal (nickel in the gold)    Patient Measurements: Height: 5\' 6"  (167.6 cm) Weight: 205 lb 12.8 oz (93.4 kg) IBW/kg (Calculated) : 59.3 Heparin Dosing Weight: 80.2 kg  Vital Signs: Temp: 98.1 F (36.7 C) (02/07 0743) Temp Source: Oral (02/07 0743) BP: 125/54 (02/07 0743) Pulse Rate: 70 (02/07 0743)  Labs: Recent Labs    07/15/19 1632 07/15/19 1708 07/15/19 2020 07/16/19 0157 07/16/19 0959  HGB  --  9.7*  --  8.3*  --   HCT  --  33.7*  --  29.4*  --   PLT  --  282  --  234  --   APTT  --   --  130*  --   --   LABPROT  --   --  14.4  --   --   INR  --   --  1.1  --   --   HEPARINUNFRC  --   --   --  0.51 0.51  CREATININE 1.75*  --   --  1.75*  --     Estimated Creatinine Clearance: 34.4 mL/min (A) (by C-G formula based on SCr of 1.75 mg/dL (H)).   Medical History: Past Medical History:  Diagnosis Date  . Anemia   . Anemia, iron deficiency 11/07/2013  . Arthritis   . CAD (coronary artery disease)   . Cerebrovascular accident ([place])   . CHF (congestive heart failure) ([place])   . Chicken pox   . Chronic kidney disease   . Colitis    ? at 79  . Diabetes mellitus   . Hashimoto thyroiditis    w subsequent hypothyroidism  . Headache   . HTN (hypertension)   . Hyperlipidemia   . Hypothyroidism   . Myocardial infarction ([place])   . 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 Sleep apnea     Assessment: Patient is a 69yo female admitted for possible arterial involvement of LLE. Patient with pain, discoloration and  cold extremity. Pharmacy consulted for Heparin dosing.  2/7 0157 HL 0.51 therapeutic x 1, H/H worse, PLTs stable.  2/7 0959 HL 0.51 therapeutic x 2   Goal of Therapy:  Heparin level 0.3-0.7 units/ml Monitor platelets by anticoagulation protocol: Yes   Plan:  2/7 0959, HL 0.51 therapeutic x 2, will continue current rate.  HL/CBC daily per protocol.  Lu Duffel, PharmD, BCPS Clinical Pharmacist 07/16/2019 11:54 AM

## 2019-07-16 NOTE — Consult Note (Signed)
[facility] [place]  Patient name: [name] MRN: [id number] DOB: Nov 05, 1950 Sex: female   REQUESTING PROVIDER:    ER   REASON FOR CONSULT:    Left leg pain  HISTORY OF PRESENT ILLNESS:   [name] is a 69 y.o. female, who presented to the emergency department on 07/15/2019 with a 2-day history of left leg pain.  This occurred spontaneously.  She denies a prior history of claudication.  She describes numbness in her foot.  This is exacerbated by walking and alleviated with rest.  She does not have any open wounds.  Her symptoms have significantly improved with IV heparin drip overnight.  She feels her foot is much warmer.  There is minimal numbness.  The patient has a history of coronary artery disease.  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  She also had a stroke about that time.  The patient is a diabetic and does have neuropathy.  She has chronic renal insufficiency.  Her creatinine was 1.7 on admission.  She is medically managed for hypertension and takes a statin for hypercholesterolemia.  She is a non-smoker.  PAST MEDICAL HISTORY    Past Medical History:  Diagnosis Date  . Anemia   . Anemia, iron deficiency 11/07/2013  . Arthritis   . CAD (coronary artery disease)   . Cerebrovascular accident ([place])   . CHF (congestive heart failure) ([place])   . Chicken pox   . Chronic kidney disease   . Colitis    ? at 29  . Diabetes mellitus   . Hashimoto thyroiditis    w subsequent hypothyroidism  . Headache   . HTN (hypertension)   . Hyperlipidemia   . Hypothyroidism   . Myocardial infarction ([place])   . 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 Sleep apnea      FAMILY HISTORY   Family History  Problem Relation Age of Onset  . Coronary artery disease Other   . Stroke Maternal Grandmother   . Diabetes Paternal Grandmother        diabetic coma  . Heart attack Father   . Heart  attack Mother   . Heart attack Maternal Grandfather   . Heart attack Paternal Grandfather   . Breast cancer Neg Hx     SOCIAL HISTORY:   Social History   Socioeconomic History  . Marital status: Single    Spouse name: Not on file  . Number of children: 0  . Years of education: Not on file  . Highest education level: Not on file  Occupational History  . Occupation: customer service    Employer: [employer]  Tobacco Use  . Smoking status: Never Smoker  . Smokeless tobacco: Never Used  Substance and Sexual Activity  . Alcohol use: No  . Drug use: No  . Sexual activity: Not on file  Other Topics Concern  . Not on file  Social History Narrative   Single, no children   Customer service   1 caffeine/day   Social Determinants of Health   Financial Resource Strain:   . Difficulty of Paying Living Expenses: Not on file  Food Insecurity:   . Worried About Charity fundraiser in the Last Year: Not on file  . Ran Out of Food in the Last Year: Not on file  Transportation Needs:   . Lack of Transportation (Medical): Not on file  . Lack of Transportation (Non-Medical): Not on file  Physical Activity:   .  Days of Exercise per Week: Not on file  . Minutes of Exercise per Session: Not on file  Stress:   . Feeling of Stress : Not on file  Social Connections:   . Frequency of Communication with Friends and Family: Not on file  . Frequency of Social Gatherings with Friends and Family: Not on file  . Attends Religious Services: Not on file  . Active Member of Clubs or Organizations: Not on file  . Attends Archivist Meetings: Not on file  . Marital Status: Not on file  Intimate Partner Violence:   . Fear of Current or Ex-Partner: Not on file  . Emotionally Abused: Not on file  . Physically Abused: Not on file  . Sexually Abused: Not on file    ALLERGIES:    Allergies  Allergen Reactions  . Niacin Other (See Comments)  . Ultram [Tramadol Hcl]     Intolerance, makes  woozy  . Baclofen Other (See Comments)    Dizziness  . Cardura [Doxazosin Mesylate] Other (See Comments)    Dizzy with passing out spells  . Morphine And Related Nausea And Vomiting  . Other Rash    Gold metal (nickel in the gold)    CURRENT MEDICATIONS:    Current Facility-Administered Medications  Medication Dose Route Frequency Provider Last Rate Last Admin  . acetaminophen (TYLENOL) tablet 650 mg  650 mg Oral Q6H PRN Jennye Boroughs, MD       Or  . acetaminophen (TYLENOL) suppository 650 mg  650 mg Rectal Q6H PRN Jennye Boroughs, MD      . aspirin EC tablet 81 mg  81 mg Oral Daily Jennye Boroughs, MD   81 mg at 07/16/19 [id number]  . benazepril (LOTENSIN) tablet 40 mg  40 mg Oral Daily Jennye Boroughs, MD   40 mg at 07/16/19 0910  . cloNIDine (CATAPRES) tablet 0.1 mg  0.1 mg Oral BID Jennye Boroughs, MD   0.1 mg at 07/16/19 0907  . ferrous sulfate tablet 325 mg  325 mg Oral Tonye Pearson, MD   325 mg at 07/16/19 [id number]  . furosemide (LASIX) tablet 40 mg  40 mg Oral Daily Jennye Boroughs, MD   40 mg at 07/16/19 0908  . heparin ADULT infusion 100 units/mL (25000 units/256mL sodium chloride 0.45%)  1,350 Units/hr Intravenous Continuous Jennye Boroughs, MD 13.5 mL/hr at 07/16/19 0300 1,350 Units/hr at 07/16/19 0300  . hydrALAZINE (APRESOLINE) tablet 50 mg  50 mg Oral TID Jennye Boroughs, MD   50 mg at 07/16/19 0907  . influenza vaccine adjuvanted (FLUAD) injection 0.5 mL  0.5 mL Intramuscular Tomorrow-1000 Jennye Boroughs, MD      . insulin aspart (novoLOG) injection 0-15 Units  0-15 Units Subcutaneous TID WC Jennye Boroughs, MD   Stopped at 07/16/19 [phone]  . levothyroxine (SYNTHROID) tablet 137 mcg  137 mcg Oral Q0600 Jennye Boroughs, MD   137 mcg at 07/16/19 0607  . ondansetron (ZOFRAN) tablet 4 mg  4 mg Oral Q6H PRN Jennye Boroughs, MD   4 mg at 07/15/19 2201   Or  . ondansetron (ZOFRAN) injection 4 mg  4 mg Intravenous Q6H PRN Jennye Boroughs, MD      . pantoprazole (PROTONIX) EC tablet 40 mg  40  mg Oral Daily Jennye Boroughs, MD   40 mg at 07/16/19 0907  . polyethylene glycol (MIRALAX / GLYCOLAX) packet 17 g  17 g Oral Daily PRN Jennye Boroughs, MD      . potassium chloride (KLOR-CON) CR tablet  20 mEq  20 mEq Oral Daily Jennye Boroughs, MD   20 mEq at 07/16/19 0907  . pravastatin (PRAVACHOL) tablet 40 mg  40 mg Oral q1800 Jennye Boroughs, MD      . sodium chloride 0.9 % bolus 500 mL  500 mL Intravenous Once Jennye Boroughs, MD      . vitamin B-12 (CYANOCOBALAMIN) tablet 1,000 mcg  1,000 mcg Oral Daily Jennye Boroughs, MD   1,000 mcg at 07/16/19 0907    REVIEW OF SYSTEMS:   [X]  denotes positive finding, [ ]  denotes negative finding Cardiac  Comments:  Chest pain or chest pressure:    Shortness of breath upon exertion:    Short of breath when lying flat:    Irregular heart rhythm:        Vascular    Pain in calf, thigh, or hip brought on by ambulation: x   Pain in feet at night that wakes you up from your sleep:  x   Blood clot in your veins:    Leg swelling:         Pulmonary    Oxygen at home:    Productive cough:     Wheezing:         Neurologic    Sudden weakness in arms or legs:     Sudden numbness in arms or legs:     Sudden onset of difficulty speaking or slurred speech:    Temporary loss of vision in one eye:     Problems with dizziness:         Gastrointestinal    Blood in stool:      Vomited blood:         Genitourinary    Burning when urinating:     Blood in urine:        Psychiatric    Major depression:         Hematologic    Bleeding problems:    Problems with blood clotting too easily:        Skin    Rashes or ulcers:        Constitutional    Fever or chills:     PHYSICAL EXAM:   Vitals:   07/16/19 0206 07/16/19 0436 07/16/19 0743 07/16/19 1255  BP:  (!) 120/59 (!) 125/54 119/63  Pulse:  63 70 62  Resp:  19 18 19   Temp:  98.2 F (36.8 C) 98.1 F (36.7 C) 97.8 F (36.6 C)  TempSrc:  Oral Oral Oral  SpO2:  96% 96% 98%  Weight: 93.4 kg      Height:        GENERAL: The patient is a well-nourished female, in no acute distress. The vital signs are documented above. CARDIAC: There is a regular rate and rhythm.  VASCULAR: Palpable bilateral femoral pulses.  Nonpalpable popliteal pulses.  She has a biphasic right posterior tibial Doppler signal and a monophasic left posterior Doppler signal PULMONARY: Nonlabored respirations ABDOMEN: Soft and non-tender with normal pitched bowel sounds.  MUSCULOSKELETAL: There are no major deformities or cyanosis. NEUROLOGIC: No focal weakness or paresthesias are detected. SKIN: There are no ulcers or rashes noted. PSYCHIATRIC: The patient has a normal affect.  STUDIES:   None  ASSESSMENT and PLAN   Peripheral vascular disease with acute presentation of symptoms in the left leg: Prior to 2 days ago, the patient did not have any significant lower extremity symptoms.  Her symptoms are mainly pain with ambulation and numbness at rest as well  as a cool sensation.  These did improve with heparin overnight.  On exam she has a monophasic left posterior tibial Doppler signal.  We discussed proceeding with angiography to define the extent of her disease.  We also discussed the possibility of percutaneous versus open revascularization, based on the results of her imaging study.  I discussed the details of the procedure as well as the risks and benefits.  She is in agreement to proceed tomorrow.  She will need to be n.p.o. after midnight.   Leia Alf, MD, FACS [facility] [phone] Pager [phone]

## 2019-07-16 NOTE — Progress Notes (Signed)
ANTICOAGULATION CONSULT NOTE - Initial Consult  Pharmacy Consult for Heparin Drip Indication: Limb Ischemia  Allergies  Allergen Reactions  . Niacin Other (See Comments)  . Ultram [Tramadol Hcl]     Intolerance, makes woozy  . Baclofen Other (See Comments)    Dizziness  . Cardura [Doxazosin Mesylate] Other (See Comments)    Dizzy with passing out spells  . Morphine And Related Nausea And Vomiting  . Other Rash    Gold metal (nickel in the gold)    Patient Measurements: Height: 5\' 6"  (167.6 cm) Weight: 205 lb 12.8 oz (93.4 kg) IBW/kg (Calculated) : 59.3 Heparin Dosing Weight: 80.2 kg  Vital Signs: Temp: 98.3 F (36.8 C) (02/06 2337) Temp Source: Oral (02/06 2337) BP: 143/56 (02/06 2337) Pulse Rate: 81 (02/06 2337)  Labs: Recent Labs    07/15/19 1632 07/15/19 1708 07/15/19 2020 07/16/19 0157  HGB  --  9.7*  --  8.3*  HCT  --  33.7*  --  29.4*  PLT  --  282  --  234  APTT  --   --  130*  --   LABPROT  --   --  14.4  --   INR  --   --  1.1  --   HEPARINUNFRC  --   --   --  0.51  CREATININE 1.75*  --   --  1.75*    Estimated Creatinine Clearance: 34.4 mL/min (A) (by C-G formula based on SCr of 1.75 mg/dL (H)).   Medical History: Past Medical History:  Diagnosis Date  . Anemia   . Anemia, iron deficiency 11/07/2013  . Arthritis   . CAD (coronary artery disease)   . Cerebrovascular accident ([place])   . CHF (congestive heart failure) ([place])   . Chicken pox   . Chronic kidney disease   . Colitis    ? at 39  . Diabetes mellitus   . Hashimoto thyroiditis    w subsequent hypothyroidism  . Headache   . HTN (hypertension)   . Hyperlipidemia   . Hypothyroidism   . Myocardial infarction ([place])   . 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 Sleep apnea     Assessment: Patient is a 69yo female admitted for possible arterial involvement of LLE. Patient with pain, discoloration and cold extremity. Pharmacy consulted for Heparin  dosing.  2/7 0157 HL 0.51 therapeutic x 1, H/H worse, PLTs stable.   Goal of Therapy:  Heparin level 0.3-0.7 units/ml Monitor platelets by anticoagulation protocol: Yes   Plan:  Give 4800 units bolus x 1 Start heparin infusion at 1350 units/hr Check anti-Xa level in 8 hours and daily while on heparin Continue to monitor H&H and platelets   2/7 0157, HL therapeutic, will continue current rate and recheck HL in 8 hours to confirm.   Hart Robinsons, PharmD Clinical Pharmacist   07/16/2019 2:37 AM

## 2019-07-17 ENCOUNTER — Encounter
Admission: EM | Disposition: A | Discharge status: Home / Self Care | Payer: Self-pay | Source: Ambulatory Visit | Attending: Internal Medicine

## 2019-07-17 ENCOUNTER — Inpatient Hospital Stay (HOSPITAL_COMMUNITY)
Admit: 2019-07-17 | Discharge: 2019-07-17 | Disposition: A | Discharge status: Home / Self Care | Payer: Medicare Other | Attending: Internal Medicine | Admitting: Internal Medicine

## 2019-07-17 DIAGNOSIS — I70222 Atherosclerosis of native arteries of extremities with rest pain, left leg: Secondary | ICD-10-CM

## 2019-07-17 DIAGNOSIS — I5042 Chronic combined systolic (congestive) and diastolic (congestive) heart failure: Secondary | ICD-10-CM

## 2019-07-17 HISTORY — PX: LOWER EXTREMITY ANGIOGRAPHY: CATH118251

## 2019-07-17 LAB — CBC
HCT: 29.8 % — ABNORMAL LOW (ref 36.0–46.0)
Hemoglobin: 8.5 g/dL — ABNORMAL LOW (ref 12.0–15.0)
MCH: 22.7 pg — ABNORMAL LOW (ref 26.0–34.0)
MCHC: 28.5 g/dL — ABNORMAL LOW (ref 30.0–36.0)
MCV: 79.7 fL — ABNORMAL LOW (ref 80.0–100.0)
Platelets: 235 10*3/uL (ref 150–400)
RBC: 3.74 MIL/uL — ABNORMAL LOW (ref 3.87–5.11)
RDW: 16.2 % — ABNORMAL HIGH (ref 11.5–15.5)
WBC: 5.5 10*3/uL (ref 4.0–10.5)
nRBC: 0 % (ref 0.0–0.2)

## 2019-07-17 LAB — ECHOCARDIOGRAM COMPLETE
Height: 66 in
Weight: 3272 oz

## 2019-07-17 LAB — BASIC METABOLIC PANEL
Anion gap: 8 (ref 5–15)
BUN: 37 mg/dL — ABNORMAL HIGH (ref 8–23)
CO2: 24 mmol/L (ref 22–32)
Calcium: 8.2 mg/dL — ABNORMAL LOW (ref 8.9–10.3)
Chloride: 106 mmol/L (ref 98–111)
Creatinine, Ser: 1.63 mg/dL — ABNORMAL HIGH (ref 0.44–1.00)
GFR calc Af Amer: 37 mL/min — ABNORMAL LOW (ref >60)
GFR calc non Af Amer: 32 mL/min — ABNORMAL LOW (ref >60)
Glucose, Bld: 116 mg/dL — ABNORMAL HIGH (ref 70–99)
Potassium: 4.5 mmol/L (ref 3.5–5.1)
Sodium: 138 mmol/L (ref 135–145)

## 2019-07-17 LAB — GLUCOSE, CAPILLARY
Glucose-Capillary: 104 mg/dL — ABNORMAL HIGH (ref 70–99)
Glucose-Capillary: 104 mg/dL — ABNORMAL HIGH (ref 70–99)
Glucose-Capillary: 110 mg/dL — ABNORMAL HIGH (ref 70–99)
Glucose-Capillary: 231 mg/dL — ABNORMAL HIGH (ref 70–99)
Glucose-Capillary: 97 mg/dL (ref 70–99)

## 2019-07-17 LAB — PROTIME-INR
INR: 1.2 (ref 0.8–1.2)
Prothrombin Time: 14.6 seconds (ref 11.4–15.2)

## 2019-07-17 LAB — HEPARIN LEVEL (UNFRACTIONATED): Heparin Unfractionated: 0.82 IU/mL — ABNORMAL HIGH (ref 0.30–0.70)

## 2019-07-17 SURGERY — LOWER EXTREMITY ANGIOGRAPHY
Anesthesia: Moderate Sedation | Laterality: Left

## 2019-07-17 MED ORDER — ASPIRIN EC 81 MG PO TBEC
81.0000 mg | DELAYED_RELEASE_TABLET | Freq: Every day | ORAL | Status: DC
  Administered 2019-07-18 – 2019-07-19 (×2): 81 mg via ORAL
  Filled 2019-07-17 (×2): qty 1

## 2019-07-17 MED ORDER — CEFAZOLIN SODIUM-DEXTROSE 1-4 GM/50ML-% IV SOLN
1.0000 g | Freq: Once | INTRAVENOUS | Status: AC
  Administered 2019-07-17: 12:00:00 1 g via INTRAVENOUS

## 2019-07-17 MED ORDER — MIDAZOLAM HCL 5 MG/5ML IJ SOLN
INTRAMUSCULAR | Status: AC
  Filled 2019-07-17: qty 5

## 2019-07-17 MED ORDER — TIROFIBAN HCL IN NACL 5-0.9 MG/100ML-% IV SOLN
0.0750 ug/kg/min | INTRAVENOUS | Status: DC
  Administered 2019-07-17: 14:00:00 0.075 ug/kg/min via INTRAVENOUS
  Filled 2019-07-17 (×2): qty 100

## 2019-07-17 MED ORDER — ALTEPLASE 2 MG IJ SOLR
INTRAMUSCULAR | Status: DC | PRN
  Administered 2019-07-17: 4 mg

## 2019-07-17 MED ORDER — FENTANYL CITRATE (PF) 100 MCG/2ML IJ SOLN
INTRAMUSCULAR | Status: DC | PRN
  Administered 2019-07-17: 50 ug via INTRAVENOUS
  Administered 2019-07-17: 25 ug via INTRAVENOUS
  Administered 2019-07-17: 50 ug via INTRAVENOUS
  Administered 2019-07-17: 25 ug via INTRAVENOUS

## 2019-07-17 MED ORDER — HEPARIN SODIUM (PORCINE) 1000 UNIT/ML IJ SOLN
INTRAMUSCULAR | Status: AC
  Filled 2019-07-17: qty 1

## 2019-07-17 MED ORDER — APIXABAN 5 MG PO TABS
5.0000 mg | ORAL_TABLET | Freq: Two times a day (BID) | ORAL | Status: DC
  Administered 2019-07-18 – 2019-07-19 (×3): 5 mg via ORAL
  Filled 2019-07-17 (×3): qty 1

## 2019-07-17 MED ORDER — IODIXANOL 320 MG/ML IV SOLN
INTRAVENOUS | Status: DC | PRN
  Administered 2019-07-17: 13:00:00 85 mL via INTRA_ARTERIAL

## 2019-07-17 MED ORDER — TIROFIBAN (AGGRASTAT) BOLUS VIA INFUSION
25.0000 ug/kg | Freq: Once | INTRAVENOUS | Status: AC
  Filled 2019-07-17: qty 47

## 2019-07-17 MED ORDER — TIROFIBAN HCL IV 12.5 MG/250 ML
INTRAVENOUS | Status: AC
  Administered 2019-07-17: 14:00:00 2320 ug via INTRAVENOUS
  Filled 2019-07-17: qty 250

## 2019-07-17 MED ORDER — HEPARIN SODIUM (PORCINE) 1000 UNIT/ML IJ SOLN
INTRAMUSCULAR | Status: DC | PRN
  Administered 2019-07-17: 5000 [IU] via INTRAVENOUS

## 2019-07-17 MED ORDER — MIDAZOLAM HCL 2 MG/2ML IJ SOLN
INTRAMUSCULAR | Status: DC | PRN
  Administered 2019-07-17: 2 mg via INTRAVENOUS
  Administered 2019-07-17 (×2): 0.5 mg via INTRAVENOUS

## 2019-07-17 MED ORDER — FENTANYL CITRATE (PF) 100 MCG/2ML IJ SOLN
INTRAMUSCULAR | Status: AC
  Filled 2019-07-17: qty 2

## 2019-07-17 MED ORDER — SODIUM CHLORIDE 0.9 % IV SOLN: INTRAVENOUS | Status: DC

## 2019-07-17 SURGICAL SUPPLY — 22 items
BALLN LUTONIX 018 5X150X130 (BALLOONS) ×3
BALLN ULTRVRSE 2.5X220X150 (BALLOONS) ×3
BALLN ULTRVRSE 3X300X150 (BALLOONS) ×2
BALLN ULTRVRSE 3X300X150 OTW (BALLOONS) ×1
BALLOON LUTONIX 018 5X150X130 (BALLOONS) IMPLANT
BALLOON ULTRVRSE 2.5X220X150 (BALLOONS) IMPLANT
BALLOON ULTRVRSE 3X300X150 OTW (BALLOONS) IMPLANT
CATH BEACON 5 .038 100 VERT TP (CATHETERS) ×2 IMPLANT
CATH PIG 70CM (CATHETERS) ×2 IMPLANT
CATH ROTAREX 135 6FR (CATHETERS) ×2 IMPLANT
DEVICE PRESTO INFLATION (MISCELLANEOUS) ×2 IMPLANT
DEVICE SAFEGUARD 24CM (GAUZE/BANDAGES/DRESSINGS) ×2 IMPLANT
DEVICE STARCLOSE SE CLOSURE (Vascular Products) ×2 IMPLANT
GLIDEWIRE ADV .035X260CM (WIRE) ×2 IMPLANT
GUIDEWIRE PFTE-COATED .018X300 (WIRE) ×4 IMPLANT
PACK ANGIOGRAPHY (CUSTOM PROCEDURE TRAY) ×3 IMPLANT
SHEATH ANL2 6FRX45 HC (SHEATH) ×2 IMPLANT
SHEATH BRITE TIP 5FRX11 (SHEATH) ×2 IMPLANT
STENT VIABAHN 6X150X120 (Permanent Stent) ×2 IMPLANT
SYR MEDRAD MARK 7 150ML (SYRINGE) ×2 IMPLANT
TUBING CONTRAST HIGH PRESS 72 (TUBING) ×2 IMPLANT
WIRE J 3MM .035X145CM (WIRE) ×2 IMPLANT

## 2019-07-17 NOTE — Progress Notes (Signed)
Progress Note    [name]  [id number] DOB: 09-25-1950  DOA: 07/15/2019 PCP: Kirk Ruths, MD      Brief Narrative:    Medical records reviewed and are as summarized below:  [name] is an 69 y.o. female with medical history significant for CAD status post CABG 20 years ago, stroke about 20 years ago, CHF ( recently received IVLasix in ED on 06/30/2019), history of gastric ulcer, iron deficiency anemia, arthritis, diabetes mellitus, hypertension, hypothyroidism, hyperlipidemia, CKD stage 3b, sleep apnea, presented to the hospital because of pain in the left leg and left foot.  The pain started about 2 days ago and it was of sudden onset. It's moderate in severity It is worse with walking or when she stands on her foot but relieved with rest.  It is associated with coldness of the left leg and foot.  She said she could not even walk her lower extremity with an electric blanket.  She had to pour hot water on it but after a while it became cold again.      Assessment/Plan:   Active Problems:   CAD, ARTERY BYPASS GRAFT   Ischemic foot   H/O: stroke   PAD (peripheral artery disease) (HCC)  Peripheral vascular disease with probable ischemic left leg/foot: s/p mechanical thrombectomy and percutaneous transluminal angioplasty to the left anterior tibial artery, tibioperoneal trunk, peroneal artery and Viabahn stent placement to the left SFA.  She also had mechanical thrombectomy to the left popliteal artery.  Procedure was done on 07/17/2019.  Eliquis was ordered by vascular surgeon to start tomorrow.  Type 2 diabetes mellitus with hyperglycemia: Hold glimepiride.  Treat with NovoLog as needed.  CAD s/p CABG and history of stroke: Continue aspirin and statin  Chronic systolic and diastolic CHF: 2D echo showed EF estimated at 30 to [id number], grade 2 diastolic dysfunction. Compensated.  Continue Lasix.  She is already on benazepril.  Unfortunately, she is bradycardic  with heart rate between 50 and 55.  No beta-blockers for now.  Patient said she has an appointment to go to the heart failure clinic.  Sinus bradycardia/first-degree AV block/left bundle branch block: No beta-blockers for now.  Hypertension: Continue antihypertensives  CKD stage IIIb: Creatinine stable.  History of gastric ulcer (2 years ago): Continue Protonix for GI prophylaxis   She is on Synthroid and ferrous sulfate for hypothyroidism Iron deficiency anemia respectively.    Body mass index is 33.02 kg/m.  (Obesity)   Family Communication/Anticipated D/C date and plan/Code Status   DVT prophylaxis: IV heparin Code Status: Full code Family Communication: Plan discussed with patient Disposition Plan: Possible discharge to home tomorrow      Subjective:   She still has tingling in the toes of the left foot but otherwise feels better.  Objective:    Vitals:   07/17/19 1400 07/17/19 1415 07/17/19 1443 07/17/19 1529  BP: (!) 132/45  (!) 134/44 (!) 141/39  Pulse: (!) 57  (!) 57 (!) 57  Resp: 15  18 16   Temp:   98.5 F (36.9 C) 97.8 F (36.6 C)  TempSrc:   Oral Oral  SpO2: 100% 100% 96% 98%  Weight:      Height:        Intake/Output Summary (Last 24 hours) at 07/17/2019 1708 Last data filed at 07/17/2019 1531 Gross per 24 hour  Intake 425.04 ml  Output 1500 ml  Net -1074.96 ml   Filed Weights   07/16/19 0206 07/17/19  [id number] 07/17/19 1024  Weight: 93.4 kg 92.8 kg 92.8 kg    Exam:  GEN: NAD SKIN: No rash EYES: EOMI ENT: MMM CV: RRR PULM: Air entry adequate bilaterally, no wheezing heard.  She has bibasilar rales. ABD: soft, obese, NT, +BS CNS: AAO x 3, non focal EXT: No edema or tenderness.  Bilateral feet are warm to touch.  No pulse palpable in the left dorsalis pedis artery   Data Reviewed:   I have personally reviewed following labs and imaging studies:  Labs: Labs show the following:   Basic Metabolic Panel: Recent Labs  Lab  07/15/19 1632 07/15/19 1632 07/16/19 0157 07/17/19 0557  NA 141  --  142 138  K 4.6   < > 4.7 4.5  CL 109  --  111 106  CO2 24  --  24 24  GLUCOSE 193*  --  135* 116*  BUN 41*  --  41* 37*  CREATININE 1.75*  --  1.75* 1.63*  CALCIUM 8.7*  --  8.3* 8.2*   < > = values in this interval not displayed.   GFR Estimated Creatinine Clearance: 36.9 mL/min (A) (by C-G formula based on SCr of 1.63 mg/dL (H)). Liver Function Tests: Recent Labs  Lab 07/15/19 1632  AST 27  ALT 14  ALKPHOS 118  BILITOT 0.6  PROT 6.8  ALBUMIN 3.7   No results for input(s): LIPASE, AMYLASE in the last 168 hours. No results for input(s): AMMONIA in the last 168 hours. Coagulation profile Recent Labs  Lab 07/15/19 2020 07/17/19 0557  INR 1.1 1.2    CBC: Recent Labs  Lab 07/15/19 1708 07/16/19 0157 07/17/19 0557  WBC 6.3 6.1 5.5  NEUTROABS 3.9  --   --   HGB 9.7* 8.3* 8.5*  HCT 33.7* 29.4* 29.8*  MCV 79.1* 79.7* 79.7*  PLT 282 234 235   Cardiac Enzymes: No results for input(s): CKTOTAL, CKMB, CKMBINDEX, TROPONINI in the last 168 hours. BNP (last 3 results) No results for input(s): PROBNP in the last 8760 hours. CBG: Recent Labs  Lab 07/16/19 2051 07/17/19 0733 07/17/19 1022 07/17/19 1308 07/17/19 1612  GLUCAP 98 104* 104* 110* 231*   D-Dimer: No results for input(s): DDIMER in the last 72 hours. Hgb A1c: No results for input(s): HGBA1C in the last 72 hours. Lipid Profile: No results for input(s): CHOL, HDL, LDLCALC, TRIG, CHOLHDL, LDLDIRECT in the last 72 hours. Thyroid function studies: No results for input(s): TSH, T4TOTAL, T3FREE, THYROIDAB in the last 72 hours.  Invalid input(s): FREET3 Anemia work up: No results for input(s): VITAMINB12, FOLATE, FERRITIN, TIBC, IRON, RETICCTPCT in the last 72 hours. Sepsis Labs: Recent Labs  Lab 07/15/19 1708 07/16/19 0157 07/17/19 0557  WBC 6.3 6.1 5.5    Microbiology Recent Results (from the past 240 hour(s))  Respiratory  Panel by RT PCR (Flu A&B, Covid) - Nasopharyngeal Swab     Status: None   Collection Time: 07/15/19  5:14 PM   Specimen: Nasopharyngeal Swab  Result Value Ref Range Status   SARS Coronavirus 2 by RT PCR NEGATIVE NEGATIVE Final    Comment: (NOTE) SARS-CoV-2 target nucleic acids are NOT DETECTED. The SARS-CoV-2 RNA is generally detectable in upper respiratoy specimens during the acute phase of infection. The lowest concentration of SARS-CoV-2 viral copies this assay can detect is 131 copies/mL. A negative result does not preclude SARS-Cov-2 infection and should not be used as the sole basis for treatment or other patient management decisions. A negative result may occur  with  improper specimen collection/handling, submission of specimen other than nasopharyngeal swab, presence of viral mutation(s) within the areas targeted by this assay, and inadequate number of viral copies (<131 copies/mL). A negative result must be combined with clinical observations, patient history, and epidemiological information. The expected result is Negative. Fact Sheet for Patients:  PinkCheek.be Fact Sheet for Healthcare Providers:  GravelBags.it This test is not yet ap proved or cleared by the [place] FDA and  has been authorized for detection and/or diagnosis of SARS-CoV-2 by FDA under an Emergency Use Authorization (EUA). This EUA will remain  in effect (meaning this test can be used) for the duration of the COVID-19 declaration under Section 564(b)(1) of the Act, 21 U.S.C. section 360bbb-3(b)(1), unless the authorization is terminated or revoked sooner.    Influenza A by PCR NEGATIVE NEGATIVE Final   Influenza B by PCR NEGATIVE NEGATIVE Final    Comment: (NOTE) The Xpert Xpress SARS-CoV-2/FLU/RSV assay is intended as an aid in  the diagnosis of influenza from Nasopharyngeal swab specimens and  should not be used as a sole basis for  treatment. Nasal washings and  aspirates are unacceptable for Xpert Xpress SARS-CoV-2/FLU/RSV  testing. Fact Sheet for Patients: PinkCheek.be Fact Sheet for Healthcare Providers: GravelBags.it This test is not yet approved or cleared by the [place] FDA and  has been authorized for detection and/or diagnosis of SARS-CoV-2 by  FDA under an Emergency Use Authorization (EUA). This EUA will remain  in effect (meaning this test can be used) for the duration of the  Covid-19 declaration under Section 564(b)(1) of the Act, 21  U.S.C. section 360bbb-3(b)(1), unless the authorization is  terminated or revoked. Performed at [facility], [address] [place], [place] [address]     Procedures and diagnostic studies:  ECHOCARDIOGRAM COMPLETE  Result Date: 07/17/2019    ECHOCARDIOGRAM REPORT   Patient Name:   MELONEE KENNER Date of Exam: 07/17/2019 Medical Rec #:  [id number]      Height:       66.0 in Accession #:    [id number]     Weight:       204.5 lb Date of Birth:  [birth year]      BSA:          2.02 m Patient Age:    39 years       BP:           111/46 mmHg Patient Gender: F              HR:           49 bpm. Exam Location:  [facility] Procedure: 2D Echo, Cardiac Doppler and Color Doppler Indications:     I50.9 Congestive Heart Failure  History:         Patient has no prior history of Echocardiogram examinations.                  CHF, Previous Myocardial Infarction and CAD, Prior CABG, CKD;                  Risk Factors:Sleep Apnea, Hypertension, Diabetes and                  Dyslipidemia.  Sonographer:     Charmayne Sheer RDCS (AE) Referring Phys:  [id number] Jennye Boroughs Diagnosing Phys: Kathlyn Sacramento MD  Sonographer Comments: Suboptimal subcostal window. IMPRESSIONS  1. Left ventricular ejection fraction, by estimation, is 30 to 35%. The left ventricle has moderately decreased  function. The left ventrical demonstrates regional wall motion  abnormalities (see scoring diagram/findings for description). There is moderately increased left ventricular hypertrophy. Left ventricular diastolic parameters are consistent with Grade II diastolic dysfunction (pseudonormalization). There is severe hypokinesis of the left ventricular, entire inferior wall and inferolateral  wall.  2. Right ventricular systolic function is normal. Tricuspid regurgitation signal is inadequate for assessing RVSP.  3. Left atrial size was severely dilated. FINDINGS  Left Ventricle: Left ventricular ejection fraction, by estimation, is 30 to 35%. The left ventricle has moderately decreased function. The left ventricle demonstrates regional wall motion abnormalities. Severe hypokinesis of the left ventricular, entire  inferior wall and inferolateral wall. The left ventricular internal cavity size was normal in size. There is moderately increased left ventricular hypertrophy. Right Ventricle: The right ventricular size is normal. No increase in right ventricular wall thickness. Right ventricular systolic function is normal. Tricuspid regurgitation signal is inadequate for assessing RVSP. Left Atrium: Left atrial size was severely dilated. Right Atrium: Right atrial size was moderately dilated. Pericardium: There is no evidence of pericardial effusion. Mitral Valve: The mitral valve is normal in structure and function. There is mild thickening of the mitral valve leaflet(s). There is mild calcification of the mitral valve leaflet(s). Normal mobility of the mitral valve leaflets. Moderate mitral annular  calcification. Trivial mitral valve regurgitation. No evidence of mitral valve stenosis. MV peak gradient, 4.9 mmHg. The mean mitral valve gradient is 1.0 mmHg. Tricuspid Valve: The tricuspid valve is normal in structure. Tricuspid valve regurgitation is trivial. No evidence of tricuspid stenosis. Aortic Valve: The aortic valve is grossly normal. Aortic valve regurgitation is not  visualized. Mild to moderate aortic valve sclerosis/calcification is present, without any evidence of aortic stenosis. Aortic valve mean gradient measures 3.0 mmHg. Aortic valve peak gradient measures 6.1 mmHg. Aortic valve area, by VTI measures 2.39 cm. Pulmonic Valve: The pulmonic valve was normal in structure. Pulmonic valve regurgitation is not visualized. No evidence of pulmonic stenosis. Aorta: The aortic root is normal in size and structure. Pulmonary Artery: The pulmonary artery is of normal size. Venous: The left upper pulmonary vein, left lower pulmonary vein, right upper pulmonary vein and right lower pulmonary vein are normal. The inferior vena cava is normal in size with greater than 50% respiratory variability, suggesting right atrial pressure of 3 mmHg. IAS/Shunts: No atrial level shunt detected by color flow Doppler.  LEFT VENTRICLE PLAX 2D LVIDd:         4.76 cm       Diastology LVIDs:         3.98 cm       LV e' lateral:   6.42 cm/s LV PW:         1.23 cm       LV E/e' lateral: 16.4 LV IVS:        0.98 cm       LV e' medial:    2.83 cm/s LVOT diam:     2.00 cm       LV E/e' medial:  37.1 LV SV:         67.86 ml LV SV Index:   17.17 LVOT Area:     3.14 cm  LV Volumes (MOD) LV area d, A2C:    41.00 cm LV area d, A4C:    39.60 cm LV area s, A2C:    32.50 cm LV area s, A4C:    33.80 cm LV major d, A2C:   9.00 cm LV major d,  A4C:   9.21 cm LV major s, A2C:   8.72 cm LV major s, A4C:   9.07 cm LV vol d, MOD A2C: 156.0 ml LV vol d, MOD A4C: 141.0 ml LV vol s, MOD A2C: 103.0 ml LV vol s, MOD A4C: 107.0 ml LV SV MOD A2C:     53.0 ml LV SV MOD A4C:     141.0 ml LV SV MOD BP:      42.6 ml RIGHT VENTRICLE RV Basal diam:  3.99 cm LEFT ATRIUM              Index       RIGHT ATRIUM           Index LA diam:        4.60 cm  2.28 cm/m  RA Area:     27.70 cm LA Vol (A2C):   71.4 ml  35.37 ml/m RA Volume:   100.00 ml 49.53 ml/m LA Vol (A4C):   122.0 ml 60.43 ml/m LA Biplane Vol: 98.0 ml  48.54 ml/m  AORTIC  VALVE                   PULMONIC VALVE AV Area (Vmax):    2.42 cm    PV Vmax:       1.00 m/s AV Area (Vmean):   2.37 cm    PV Vmean:      73.400 cm/s AV Area (VTI):     2.39 cm    PV VTI:        0.219 m AV Vmax:           123.00 cm/s PV Peak grad:  4.0 mmHg AV Vmean:          86.800 cm/s PV Mean grad:  2.0 mmHg AV VTI:            0.284 m AV Peak Grad:      6.1 mmHg AV Mean Grad:      3.0 mmHg LVOT Vmax:         94.60 cm/s LVOT Vmean:        65.400 cm/s LVOT VTI:          0.216 m LVOT/AV VTI ratio: 0.76  AORTA Ao Root diam: 3.30 cm MITRAL VALVE MV Area (PHT): 2.76 cm              SHUNTS MV Peak grad:  4.9 mmHg              Systemic VTI:  0.22 m MV Mean grad:  1.0 mmHg              Systemic Diam: 2.00 cm MV Vmax:       1.11 m/s MV Vmean:      52.7 cm/s MV Decel Time: 275 msec MV E velocity: 105.00 cm/s 103 cm/s MV A velocity: 68.90 cm/s  70.3 cm/s MV E/A ratio:  1.52        1.5 Kathlyn Sacramento MD Electronically signed by Kathlyn Sacramento MD Signature Date/Time: 07/17/2019/11:38:39 AM    Final     Medications:    [START ON 07/18/2019] apixaban  5 mg Oral BID   [START ON 07/18/2019] aspirin EC  81 mg Oral Daily   benazepril  40 mg Oral Daily   cloNIDine  0.1 mg Oral BID   ferrous sulfate  325 mg Oral QODAY   furosemide  40 mg Oral Daily   hydrALAZINE  50 mg Oral TID   influenza vaccine  adjuvanted  0.5 mL Intramuscular Tomorrow-1000   insulin aspart  0-15 Units Subcutaneous TID WC   levothyroxine  137 mcg Oral Q0600   pantoprazole  40 mg Oral Daily   potassium chloride  20 mEq Oral Daily   pravastatin  40 mg Oral q1800   vitamin B-12  1,000 mcg Oral Daily   Continuous Infusions:  sodium chloride     tirofiban 0.075 mcg/kg/min (07/17/19 1512)     LOS: 2 days   Tristin Gladman  [facility]     07/17/2019, 5:08 PM

## 2019-07-17 NOTE — Progress Notes (Signed)
ANTICOAGULATION CONSULT NOTE -   Pharmacy Consult for Heparin Drip Indication: Limb Ischemia  Allergies  Allergen Reactions  . Niacin Other (See Comments)  . Ultram [Tramadol Hcl]     Intolerance, makes woozy  . Baclofen Other (See Comments)    Dizziness  . Cardura [Doxazosin Mesylate] Other (See Comments)    Dizzy with passing out spells  . Morphine And Related Nausea And Vomiting  . Other Rash    Gold metal (nickel in the gold)    Patient Measurements: Height: 5\' 6"  (167.6 cm) Weight: 204 lb 8 oz (92.8 kg) IBW/kg (Calculated) : 59.3 Heparin Dosing Weight: 80.2 kg  Vital Signs: Temp: 98 F (36.7 C) (02/08 0419) Temp Source: Oral (02/08 0419) BP: 111/46 (02/08 0419) Pulse Rate: 52 (02/08 0419)  Labs: Recent Labs    07/15/19 1632 07/15/19 1708 07/15/19 1708 07/15/19 2020 07/16/19 0157 07/16/19 0959 07/17/19 0557  HGB  --  9.7*   < >  --  8.3*  --  8.5*  HCT  --  33.7*  --   --  29.4*  --  29.8*  PLT  --  282  --   --  234  --  235  APTT  --   --   --  130*  --   --   --   LABPROT  --   --   --  14.4  --   --  14.6  INR  --   --   --  1.1  --   --  1.2  HEPARINUNFRC  --   --   --   --  0.51 0.51 0.82*  CREATININE 1.75*  --   --   --  1.75*  --   --    < > = values in this interval not displayed.    Estimated Creatinine Clearance: 34.3 mL/min (A) (by C-G formula based on SCr of 1.75 mg/dL (H)).   Medical History: Past Medical History:  Diagnosis Date  . Anemia   . Anemia, iron deficiency 11/07/2013  . Arthritis   . CAD (coronary artery disease)   . Cerebrovascular accident ([place])   . CHF (congestive heart failure) ([place])   . Chicken pox   . Chronic kidney disease   . Colitis    ? at 49  . Diabetes mellitus   . Hashimoto thyroiditis    w subsequent hypothyroidism  . Headache   . HTN (hypertension)   . Hyperlipidemia   . Hypothyroidism   . Myocardial infarction ([place])   . 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 Sleep  apnea     Assessment: Patient is a 68yo female admitted for possible arterial involvement of LLE. Patient with pain, discoloration and cold extremity. Pharmacy consulted for Heparin dosing.  2/7 0157 HL 0.51 therapeutic x 1, H/H worse, PLTs stable.  2/7 0959 HL 0.51 therapeutic x 2  2/8 0557 HL 0.82, SUPRAtherapeutic, CBC stable  Goal of Therapy:  Heparin level 0.3-0.7 units/ml Monitor platelets by anticoagulation protocol: Yes   Plan:  HL SUPRAtherapeutic, CBC stable.  Will decrease Heparin infusion to 1200 units/hr and recheck HL in ~8 hours  HL/CBC daily per protocol.  Ena Dawley, PharmD Clinical Pharmacist 07/17/2019 6:54 AM

## 2019-07-17 NOTE — Progress Notes (Signed)
Dr. Lucky Cowboy at bedside, speaking with pt. Pre-procedure. Pt. Verbalized understanding.

## 2019-07-17 NOTE — Op Note (Signed)
[facility]  Percutaneous Study/Intervention Procedural Note   Date of Surgery: 07/17/2019  Surgeon(s):Shalandria Elsbernd    Assistants:none  Pre-operative Diagnosis: PAD with rest pain LLE, acute on chronic ischemia  Post-operative diagnosis:  Same  Procedure(s) Performed:             1.  Ultrasound guidance for vascular access right femoral artery             2.  Catheter placement into left common femoral artery from right femoral approach             3.  Aortogram and selective left lower extremity angiogram             4.  Mechanical thrombectomy to the left popliteal artery, anterior tibial artery, and tibioperoneal trunk and peroneal artery with the roto-Rx device             5.  Percutaneous transluminal angioplasty of the left anterior tibial artery with 3 mm diameter by 30 cm length angioplasty balloon  6.  Percutaneous transluminal angioplasty of the left peroneal artery and tibioperoneal trunk with 2.5 mm diameter by 22 cm length angioplasty balloon             7.  Viabahn stent placement to the left SFA with 6 mm diameter by 15 cm length stent for thrombus and stenosis greater than 50%             8.  StarClose closure device right femoral artery  EBL: 100 cc  Contrast: 85 cc  Fluoro Time: 17.7 minutes  Moderate Conscious Sedation Time: approximately 60 minutes using 3 mg of Versed and 150 mcg of Fentanyl              Indications:  Patient is a 69 y.o.female with rest pain of the left foot that started acutely 3 to 4 days ago and pallor with nonpalpable pulses. The patient is brought in for angiography for further evaluation and potential treatment.  Due to the limb threatening nature of the situation, angiogram was performed for attempted limb salvage. The patient is aware that if the procedure fails, amputation would be expected.  The patient also understands that even with successful revascularization, amputation may still be required due to the severity  of the situation.  Risks and benefits are discussed and informed consent is obtained.   Procedure:  The patient was identified and appropriate procedural time out was performed.  The patient was then placed supine on the table and prepped and draped in the usual sterile fashion. Moderate conscious sedation was administered during a face to face encounter with the patient throughout the procedure with my supervision of the RN administering medicines and monitoring the patient's vital signs, pulse oximetry, telemetry and mental status throughout from the start of the procedure until the patient was taken to the recovery room. Ultrasound was used to evaluate the right common femoral artery.  It was patent .  A digital ultrasound image was acquired.  A Seldinger needle was used to access the right common femoral artery under direct ultrasound guidance and a permanent image was performed.  A 0.035 J wire was advanced without resistance and a 5Fr sheath was placed.  Pigtail catheter was placed into the aorta and an AP aortogram was performed. This demonstrated normal renal arteries and normal aorta and iliac segments without significant stenosis. I then crossed the aortic bifurcation and advanced to the left femoral head. Selective left lower extremity angiogram was  then performed. This demonstrated a 90% stenosis in the proximal to mid SFA with associated thrombus.  The mid SFA also had about a 70% stenosis just downstream from the initial stenosis.  The vessel then normalized until the popliteal artery which had an abrupt occlusion and appearance of thrombus present.  The origin of all 3 tibial vessels was also occluded with thrombus present with reconstitution of all 3 tibial vessels beyond the initial occlusion. It was felt that it was in the patient's best interest to proceed with intervention after these images to avoid a second procedure and a larger amount of contrast and fluoroscopy based off of the findings  from the initial angiogram. The patient was systemically heparinized and a 6 [place] Ansell sheath was then placed over the [employer] wire. I then used a Kumpe catheter and the advantage wire to navigate through the SFA lesions with no difficulty and get down into the popliteal artery where 4 mg of TPA was instilled.  I then advanced with an advantage wire and the Kumpe catheter down into the anterior tibial artery in the proximal mid segment where selective imaging performed showing thrombus present in the anterior tibial artery and occlusion proximally.  After this was allowed to dwell for 10 to 15 minutes, mechanical thrombectomy was then performed to the left popliteal artery and initially the anterior tibial artery with the roto-Rx device.  There remained occlusion of the anterior tibial artery proximally all the way down to the mid segment so I elected to perform angioplasty and a 3 mm diameter by 30 cm length angioplasty balloon was inflated to 12 atm for 1 minute.  This resulted in a marked improvement with clearance of the thrombus from the popliteal artery and only a small amount of residual thrombus in the anterior tibial artery that did not appear flow-limiting.  I then used the Kumpe catheter and a 0.018 advantage wire to selectively cannulate the tibioperoneal trunk and advanced down to the peroneal artery where selective imaging was performed.  The remainder of the peroneal artery beyond the proximal segment had good flow distally.  I then replaced a 0.018 wire and used the roto-Rx device and for mechanical thrombectomy of the tibioperoneal trunk and the peroneal arteries.  With suboptimal result, a 2.5 mm diameter by 22 cm length angioplasty balloon was inflated from the mid peroneal artery up to the tibioperoneal trunk and inflated to 10 atm for 1 minute.  Following this, an additional pass with the roto-Rx device down the tibioperoneal trunk and proximal peroneal artery resulted in marked  improvement.  There is some spasm in the peroneal artery beyond her treatment, with only a small amount of residual thrombus was present in the peroneal artery, tibioperoneal trunk, and anterior tibial artery that did not appear flow-limiting.  This was a marked improvement and I turned my attention to the SFA lesion which may have been the culprit lesion that caused the embolization.  A 6 mm diameter by 15 cm length Viabahn stent was used to cover both lesions and was postdilated with a 5 mm Lutonix drug-coated angioplasty balloon with excellent angiographic completion result and less than 10% residual stenosis.  I elected to terminate the procedure. The sheath was removed and StarClose closure device was deployed in the right femoral artery with excellent hemostatic result. The patient was taken to the recovery room in stable condition having tolerated the procedure well.  Findings:  Aortogram:  normal renal arteries, normal aorta and iliac arteries             Left Lower Extremity:  This demonstrated a 90% stenosis in the proximal to mid SFA with associated thrombus.  The mid SFA also had about a 70% stenosis just downstream from the initial stenosis.  The vessel then normalized until the popliteal artery which had an abrupt occlusion and appearance of thrombus present.  The origin of all 3 tibial vessels was also occluded with thrombus present with reconstitution of all 3 tibial vessels beyond the initial occlusion.   Disposition: Patient was taken to the recovery room in stable condition having tolerated the procedure well.  Complications: None  Leotis Pain 07/17/2019 12:57 PM   This note was created with Dragon Medical transcription system. Any errors in dictation are purely unintentional.

## 2019-07-17 NOTE — Progress Notes (Signed)
*  PRELIMINARY RESULTS* Echocardiogram 2D Echocardiogram has been performed.  [name] 07/17/2019, 8:23 AM

## 2019-07-17 NOTE — Progress Notes (Signed)
Dr. Lucky Cowboy at bedside, speaking with pt. Re: procedural results. MD assessed pt. Left foot status and pulses verified with RN to DP and PT sites of Left foot. Pt. Verbalized understanding of conversation.

## 2019-07-17 NOTE — H&P (Signed)
[facility] History & Physical Update  The patient was interviewed and re-examined.  The patient's previous History and Physical has been reviewed and is unchanged.  There is no change in the plan of care. We plan to proceed with the scheduled procedure.  Leotis Pain, MD  07/17/2019, 10:23 AM

## 2019-07-18 ENCOUNTER — Ambulatory Visit: Payer: Medicare Other

## 2019-07-18 ENCOUNTER — Encounter: Payer: Self-pay | Admitting: Cardiology

## 2019-07-18 LAB — GLUCOSE, CAPILLARY
Glucose-Capillary: 116 mg/dL — ABNORMAL HIGH (ref 70–99)
Glucose-Capillary: 153 mg/dL — ABNORMAL HIGH (ref 70–99)
Glucose-Capillary: 122 mg/dL — ABNORMAL HIGH (ref 70–99)
Glucose-Capillary: 133 mg/dL — ABNORMAL HIGH (ref 70–99)

## 2019-07-18 LAB — BASIC METABOLIC PANEL
Anion gap: 8 (ref 5–15)
BUN: 35 mg/dL — ABNORMAL HIGH (ref 8–23)
CO2: 25 mmol/L (ref 22–32)
Calcium: 8.7 mg/dL — ABNORMAL LOW (ref 8.9–10.3)
Chloride: 108 mmol/L (ref 98–111)
Creatinine, Ser: 1.87 mg/dL — ABNORMAL HIGH (ref 0.44–1.00)
GFR calc Af Amer: 31 mL/min — ABNORMAL LOW (ref >60)
GFR calc non Af Amer: 27 mL/min — ABNORMAL LOW (ref >60)
Glucose, Bld: 123 mg/dL — ABNORMAL HIGH (ref 70–99)
Potassium: 4.5 mmol/L (ref 3.5–5.1)
Sodium: 141 mmol/L (ref 135–145)

## 2019-07-18 LAB — CBC
HCT: 29.7 % — ABNORMAL LOW (ref 36.0–46.0)
Hemoglobin: 8.4 g/dL — ABNORMAL LOW (ref 12.0–15.0)
MCH: 22.5 pg — ABNORMAL LOW (ref 26.0–34.0)
MCHC: 28.3 g/dL — ABNORMAL LOW (ref 30.0–36.0)
MCV: 79.4 fL — ABNORMAL LOW (ref 80.0–100.0)
Platelets: 256 10*3/uL (ref 150–400)
RBC: 3.74 MIL/uL — ABNORMAL LOW (ref 3.87–5.11)
RDW: 16.2 % — ABNORMAL HIGH (ref 11.5–15.5)
WBC: 6.6 10*3/uL (ref 4.0–10.5)
nRBC: 0 % (ref 0.0–0.2)

## 2019-07-18 MED ORDER — SODIUM CHLORIDE 0.9% FLUSH
10.0000 mL | Freq: Two times a day (BID) | INTRAVENOUS | Status: DC
  Administered 2019-07-18 – 2019-07-19 (×2): 10 mL via INTRAVENOUS

## 2019-07-18 MED ORDER — OXYCODONE HCL 5 MG PO TABS
5.0000 mg | ORAL_TABLET | Freq: Three times a day (TID) | ORAL | Status: DC | PRN
  Administered 2019-07-18: 13:00:00 5 mg via ORAL
  Filled 2019-07-18: qty 1

## 2019-07-18 NOTE — Evaluation (Signed)
Physical Therapy Evaluation Patient Details Name: [name] MRN: [id number] DOB: 1950/07/27 Today's Date: 07/18/2019   History of Present Illness  69 y.o. female with medical history significant for CAD status post CABG 20 years ago, stroke about 20 years ago, CHF w/ EF 30-35% ( recently received IVLasix in ED on 06/30/2019), history of gastric ulcer, iron deficiency anemia, arthritis, diabetes mellitus, hypertension, hypothyroidism, hyperlipidemia, CKD stage 3b, sleep apnea, presented to the hospital because of pain in the left leg and left foot. S/p mechanical thrombectomy to L popliteal artery, anterior tibial artery, and tibioperoneal peroneal artery, angioplasty of L anterior tibial artery and L peroneal artery and tibioperoneal trunk, and stent placement to L SFA on 07/17/2019.  Clinical Impression  Pt is a 69 yo female admitted for above. Pt reports living alone but her son and daughter in law live in the apartment next door. Son works from home and is able to assist frequently, DIL limited in amount of assistance due to being 7 months pregnant. Pt reports being independent with all ADLs and is very active during the day working in the yard including weed eating and using small chainsaw. Pt encouraged to continue being active during the day and educated on the recommendations for amount of physical activity in a week for adults. Pt independent with bed mobility and mod I with transfers. Min guard for safety with ambulation within the room. Pt initially ambulating without AD but mild unsteadiness noted and pt reaching out for counter and bed rails for added support due to increased burning pain in LE with WBing. Pt provided with RW for decreased WBing through LE with improved stability noted. Pt reporting feeling more stable with RW. Pt encouraged to use RW for added stability while pain is limiting her ambulation in order to decrease fall risk. With decreased pain, suspect pt can quickly go back to  ambulating without RW. Pt educated on LE therex for HEP with therapist providing demonstration (see below for HEP). Pt verbalizing understanding and that she has done most of the exercises prior. Pt reports having no concerns over returning home and does not feel she needs further PT services. Pt educated on outpatient PT should she feel she needs PT services in the future. Will follow acutely for improved strengthening, balance, activity tolerance and decreased pain. No PT follow up needed after hospital discharge.     Follow Up Recommendations No PT follow up    Equipment Recommendations  None recommended by PT(pt has BSC and RW)    Recommendations for Other Services       Precautions / Restrictions Precautions Precautions: Fall Restrictions Weight Bearing Restrictions: No      Mobility  Bed Mobility Overal bed mobility: Independent             General bed mobility comments: deferred up in recliner  Transfers Overall transfer level: Modified independent Equipment used: None             General transfer comment: supervision for STS tranfer and transfer to/from bed to [facility]  Ambulation/Gait Ambulation/Gait assistance: Min guard Gait Distance (Feet): 15 Feet Assistive device: Rolling walker (2 wheeled);1 person hand held assist Gait Pattern/deviations: Step-through pattern;Decreased stride length Gait velocity: dec   General Gait Details: pt ambulated in room initially without AD although unsteady due to sharp burning pain in LE during ambulation, provided with RW for improved stability due to increased UE support in order to off weight LE  Stairs  Wheelchair Mobility    Modified Rankin (Stroke Patients Only)       Balance Overall balance assessment: Needs assistance Sitting-balance support: No upper extremity supported;Feet supported Sitting balance-Leahy Scale: Normal       Standing balance-Leahy Scale: Fair Standing balance comment:  able to maintain standing balance without UE support however improved stability with RW due to increased LE pain                             Pertinent Vitals/Pain Pain Assessment: No/denies pain(no pain at rest, sharp shooting pain in R thigh with movement) Faces Pain Scale: Hurts little more Pain Location: RLE Pain Descriptors / Indicators: Burning Pain Intervention(s): Limited activity within patient's tolerance;Premedicated before session;Monitored during session    Home Living Family/patient expects to be discharged to:: Private residence Living Arrangements: Alone Available Help at Discharge: Family;Available PRN/intermittently(son and DIL in neighboring aparment, Son works from home and available often, DIL limited with physical assist due to being 7 months pregnant) Type of Home: Apartment Home Access: Stairs to enter Entrance Stairs-Rails: None Entrance Stairs-Number of Steps: 3 steep steps, from parking lot to deck, 2 stairs from deck to get inside, living room is 2 steps below rest of house Home Layout: Able to live on main level with bedroom/bathroom Home Equipment: Bedside commode;Walker - 2 wheels;Shower seat;Grab bars - tub/shower Additional Comments: 2 BSC, uses one has shower chair    Prior Function Level of Independence: Independent         Comments: works from home, fall in yard >91mo ago but able to get up ind with no injuries, retired Quarry manager, 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 including weed eating and using a Warehouse manager        Extremity/Trunk Assessment   Upper Extremity Assessment Upper Extremity Assessment: Defer to OT evaluation    Lower Extremity Assessment Lower Extremity Assessment: Generalized weakness       Communication   Communication: No difficulties  Cognition Arousal/Alertness: Awake/alert Behavior During Therapy: WFL for tasks assessed/performed Overall Cognitive Status: Within Functional Limits for tasks  assessed                                        General Comments      Exercises Total Joint Exercises Long Arc Quad: AROM;Both;15 reps Marching in Standing: AROM;Both;15 reps;Seated Other Exercises Other Exercises: pt educated on standing LE therex for HEP with therapist providing demonstration for standing hip abd/ext, heel and toe raises and standing hamstring curl Other Exercises: pt encouraged to cont being active during her days once at home including walking around the yard and incoporating increased physical activity during the day to meet national guidelines/recommendations   Assessment/Plan    PT Assessment Patient needs continued PT services  PT Problem List Decreased strength;Decreased mobility;Decreased range of motion;Decreased activity tolerance;Decreased balance;Pain;Decreased knowledge of use of DME       PT Treatment Interventions DME instruction;Therapeutic exercise;Gait training;Balance training;Stair training;Neuromuscular re-education;Functional mobility training;Therapeutic activities;Patient/family education    PT Goals (Current goals can be found in the Care Plan section)  Acute Rehab PT Goals Patient Stated Goal: go home PT Goal Formulation: With patient Time For Goal Achievement: 08/01/19 Potential to Achieve Goals: Good    Frequency Min 2X/week   Barriers to discharge        Co-evaluation  AM-PAC PT "6 Clicks" Mobility  Outcome Measure Help needed turning from your back to your side while in a flat bed without using bedrails?: None Help needed moving from lying on your back to sitting on the side of a flat bed without using bedrails?: None Help needed moving to and from a bed to a chair (including a wheelchair)?: None Help needed standing up from a chair using your arms (e.g., wheelchair or bedside chair)?: None Help needed to walk in hospital room?: A Little Help needed climbing 3-5 steps with a railing? : A  Little 6 Click Score: 22    End of Session Equipment Utilized During Treatment: Gait belt Activity Tolerance: Patient tolerated treatment well;Patient limited by pain Patient left: with call bell/phone within reach;with nursing/sitter in room;in chair Nurse Communication: Mobility status PT Visit Diagnosis: Unsteadiness on feet (R26.81);Difficulty in walking, not elsewhere classified (R26.2)    Time: [id number] PT Time Calculation (min) (ACUTE ONLY): 37 min   Charges:   PT Evaluation $PT Eval Moderate Complexity: 1 Mod PT Treatments $Therapeutic Activity: 8-22 mins        Zachary George PT, DPT 4:27 PM,07/18/19 [phone]   Aidah Forquer Drucilla Chalet 07/18/2019, 4:21 PM

## 2019-07-18 NOTE — Evaluation (Signed)
Occupational Therapy Evaluation Patient Details Name: [name] MRN: [id number] DOB: 03-22-1951 Today's Date: 07/18/2019    History of Present Illness 69 y.o. female with medical history significant for CAD status post CABG 20 years ago, stroke about 20 years ago, CHF w/ EF 30-35% ( recently received IVLasix in ED on 06/30/2019), history of gastric ulcer, iron deficiency anemia, arthritis, diabetes mellitus, hypertension, hypothyroidism, hyperlipidemia, CKD stage 3b, sleep apnea, presented to the hospital because of pain in the left leg and left foot. S/p mechanical thrombectomy to L popliteal artery, anterior tibial artery, and tibioperoneal peroneal artery, angioplasty of L anterior tibial artery and L peroneal artery and tibioperoneal trunk, and stent placement to L SFA on 07/17/2019.   Clinical Impression   Pt seen for OT evaluation this date. Pt was independent in all ADL and functional mobility prior to admission, doing yard work, and working from home. Pt lives by herself in a 2 story home with access to bed/bath on main floor, which she plans to stay in for at least the first week. Pt currently requires supervision assist for LB ADL and ADL mobility due to current functional impairments (See OT Problem List below). Pt educated in energy conservation strategies including pursed lip breathing, activity pacing, home/routines modifications, work simplification, AE/DME, prioritizing of meaningful occupations, and falls prevention as well as chronic disease mgt and pt able to independently verbalize how she is to weigh herself and parameters for weight gain that would trigger her to call the MD or go to the ED. ECS Handout provided. Pt verbalized understanding and would benefit from additional skilled OT services to maximize recall and carryover of learned techniques and facilitate implementation of learned techniques into daily routines. Do not anticipate need for additional skilled OT services at  this time. Will sign off.       Follow Up Recommendations  No OT follow up    Equipment Recommendations  None recommended by OT    Recommendations for Other Services       Precautions / Restrictions Precautions Precautions: Fall Restrictions Weight Bearing Restrictions: No      Mobility Bed Mobility               General bed mobility comments: deferred up in recliner  Transfers Overall transfer level: Modified independent Equipment used: Rolling walker (2 wheeled)             General transfer comment: supervision for STS transfers    Balance Overall balance assessment: Mild deficits observed, not formally tested                                         ADL either performed or assessed with clinical judgement   ADL Overall ADL's : Modified independent                                       General ADL Comments: Mod indep with ADL requiring increased time and effort with R thigh pain with movement     Vision Patient Visual Report: No change from baseline       Perception     Praxis      Pertinent Vitals/Pain Pain Assessment: No/denies pain(no pain at rest, sharp shooting pain in R thigh with movement) Faces Pain Scale: Hurts a little bit Pain Location:  RLE Pain Descriptors / Indicators: Burning Pain Intervention(s): Monitored during session;Premedicated before session;Limited activity within patient's tolerance     Hand Dominance     Extremity/Trunk Assessment Upper Extremity Assessment Upper Extremity Assessment: Overall WFL for tasks assessed   Lower Extremity Assessment Lower Extremity Assessment: Generalized weakness       Communication Communication Communication: No difficulties   Cognition Arousal/Alertness: Awake/alert Behavior During Therapy: WFL for tasks assessed/performed Overall Cognitive Status: Within Functional Limits for tasks assessed                                      General Comments       Exercises Other Exercises Other Exercises: Pt educated in energy conservation strategies and chronic disease mgt to minimize falls risk and maximize independence with ADL and mobility, handout provided   Shoulder Instructions      Home Living Family/patient expects to be discharged to:: Private residence Living Arrangements: Alone Available Help at Discharge: Family;Available PRN/intermittently(son and DIL in next apartment, DIL currently 7 month pregnant and very sick) Type of Home: Apartment Home Access: Stairs to enter [employer] of Steps: 3 steep steps, from parking lot to deck, 2 stairs from deck to get inside, living room is 2 steps below rest of house Entrance Stairs-Rails: None Home Layout: Able to live on main level with bedroom/bathroom     Bathroom Shower/Tub: Occupational psychologist: Standard     Home Equipment: Bedside commode;Walker - 2 wheels;Shower seat;Grab bars - tub/shower   Additional Comments: 2 BSC one as BSC and one has shower chair      Prior Functioning/Environment Level of Independence: Independent        Comments: works from home, fall in yard >72mo ago but able to get up ind with no injuries, retired Quarry manager        OT Problem List: Decreased activity tolerance;Decreased strength;Pain;Decreased knowledge of use of DME or AE      OT Treatment/Interventions:      OT Goals(Current goals can be found in the care plan section) Acute Rehab OT Goals Patient Stated Goal: go home OT Goal Formulation: All assessment and education complete, DC therapy  OT Frequency:     Barriers to D/C:            Co-evaluation              AM-PAC OT "6 Clicks" Daily Activity     Outcome Measure Help from another person eating meals?: None Help from another person taking care of personal grooming?: None Help from another person toileting, which includes using toliet, bedpan, or urinal?: A Little Help from  another person bathing (including washing, rinsing, drying)?: A Little Help from another person to put on and taking off regular upper body clothing?: None Help from another person to put on and taking off regular lower body clothing?: A Little 6 Click Score: 21   End of Session    Activity Tolerance: Patient tolerated treatment well Patient left: in chair;with call bell/phone within reach;with chair alarm set  OT Visit Diagnosis: Other abnormalities of gait and mobility (R26.89)                Time: 1420-1446 OT Time Calculation (min): 26 min Charges:  OT General Charges $OT Visit: 1 Visit OT Evaluation $OT Eval Low Complexity: 1 Low OT Treatments $Self Care/Home Management : 8-22 mins  Roselyn Reef  Darryll Capers, MPH, MS, OTR/L ascom [phone] 07/18/19, 3:49 PM

## 2019-07-18 NOTE — Care Management Important Message (Signed)
Important Message  Patient Details  Name: [name] MRN: [id number] Date of Birth: 12-08-1950   Medicare Important Message Given:  Yes     Dannette Barbara 07/18/2019, 2:20 PM

## 2019-07-18 NOTE — Progress Notes (Addendum)
Progress Note    [name]  [id number] DOB: 1950-08-20  DOA: 07/15/2019 PCP: Kirk Ruths, MD      Brief Narrative:    Medical records reviewed and are as summarized below:  [name] is an 69 y.o. female with medical history significant for CAD status post CABG 20 years ago, stroke about 20 years ago, CHF ( recently received IVLasix in ED on 06/30/2019), history of gastric ulcer, iron deficiency anemia, arthritis, diabetes mellitus, hypertension, hypothyroidism, hyperlipidemia, CKD stage 3b, sleep apnea, presented to the hospital because of pain in the left leg and left foot.  The pain started about 2 days ago and it was of sudden onset. It's moderate in severity It is worse with walking or when she stands on her foot but relieved with rest.  It is associated with coldness of the left leg and foot.  She said she could not even walk her lower extremity with an electric blanket.  She had to pour hot water on it but after a while it became cold again.      Assessment/Plan:   Active Problems:   CAD, ARTERY BYPASS GRAFT   Ischemic foot   H/O: stroke   PAD (peripheral artery disease) (HCC)  Peripheral vascular disease with probable ischemic left leg/foot: s/p mechanical thrombectomy and percutaneous transluminal angioplasty to the left anterior tibial artery, tibioperoneal trunk, peroneal artery and Viabahn stent placement to the left SFA.  She also had mechanical thrombectomy to the left popliteal artery.  Procedure was done on 07/17/2019.  Continue Eliquis and aspirin.  Oxycodone as needed for pain.  Follow-up CBC.  Follow-up with vascular surgeon for further recommendation.  Type 2 diabetes mellitus with hyperglycemia: Hold glimepiride.  Glucose levels improved.  Continue  NovoLog as needed.  CAD s/p CABG and history of stroke: Continue aspirin and statin  Chronic systolic and diastolic CHF: 2D echo on [id number] showed EF estimated at 30 to 35%, grade 2  diastolic dysfunction. Compensated.  Continue Lasix.  She is already on benazepril.  Unfortunately, she is bradycardic with heart rate between 50 and 55.  No beta-blockers for now.  Patient said she has an appointment to go to the heart failure clinic on 08/01/2019.  Sinus bradycardia/first-degree AV block/left bundle branch block: No beta-blockers for now.  Anemia of chronic disease: Monitor CBC  Hypertension: Continue antihypertensives  CKD stage IIIb: Creatinine trending up slightly.  Continue to monitor.  History of gastric ulcer (2 years ago): Continue Protonix for GI prophylaxis   She is on Synthroid and ferrous sulfate for hypothyroidism Iron deficiency anemia respectively.    Body mass index is 32.7 kg/m.  (Obesity)   Family Communication/Anticipated D/C date and plan/Code Status   DVT prophylaxis: Eliquis  code Status: Full code Family Communication: Plan discussed with patient Disposition Plan: Possible discharge to home tomorrow pending clearance from vascular surgeon      Subjective:   She complains of pain in the left leg/foot and Tylenol does not help.  No shortness of breath or chest pain.     Objective:    Vitals:   07/17/19 1930 07/18/19 0353 07/18/19 0735 07/18/19 1210  BP: (!) 113/43 (!) 166/81 (!) 126/58 124/74  Pulse: (!) 52 66 (!) 53 (!) 55  Resp: 20 20 19 18   Temp: 98.8 F (37.1 C) 98.3 F (36.8 C) 98.4 F (36.9 C) 98.2 F (36.8 C)  TempSrc: Oral Oral Oral Oral  SpO2: 98% 99% 93% 98%  Weight:  91.9 kg    Height:        Intake/Output Summary (Last 24 hours) at 07/18/2019 1217 Last data filed at 07/18/2019 0841 Gross per 24 hour  Intake 276.45 ml  Output 1525 ml  Net -1248.55 ml   Filed Weights   07/17/19 0419 07/17/19 1024 07/18/19 0353  Weight: 92.8 kg 92.8 kg 91.9 kg    Exam:  GEN: NAD SKIN: No rash.  No bleeding in the right groin (entry site for catheterization) EYES: EOMI ENT: MMM CV: RRR PULM: No wheezing heard but  she has mild bibasilar rales. ABD: soft, obese, NT, +BS CNS: AAO x 3, non focal EXT: No edema or tenderness.  Bilateral feet are warm to touch.  No pulse palpable in the left dorsalis pedis artery   Data Reviewed:   I have personally reviewed following labs and imaging studies:  Labs: Labs show the following:   Basic Metabolic Panel: Recent Labs  Lab 07/15/19 1632 07/15/19 1632 07/16/19 0157 07/16/19 0157 07/17/19 0557 07/18/19 0446  NA 141  --  142  --  138 141  K 4.6   < > 4.7   < > 4.5 4.5  CL 109  --  111  --  106 108  CO2 24  --  24  --  24 25  GLUCOSE 193*  --  135*  --  116* 123*  BUN 41*  --  41*  --  37* 35*  CREATININE 1.75*  --  1.75*  --  1.63* 1.87*  CALCIUM 8.7*  --  8.3*  --  8.2* 8.7*   < > = values in this interval not displayed.   GFR Estimated Creatinine Clearance: 32 mL/min (A) (by C-G formula based on SCr of 1.87 mg/dL (H)). Liver Function Tests: Recent Labs  Lab 07/15/19 1632  AST 27  ALT 14  ALKPHOS 118  BILITOT 0.6  PROT 6.8  ALBUMIN 3.7   No results for input(s): LIPASE, AMYLASE in the last 168 hours. No results for input(s): AMMONIA in the last 168 hours. Coagulation profile Recent Labs  Lab 07/15/19 2020 07/17/19 0557  INR 1.1 1.2    CBC: Recent Labs  Lab 07/15/19 1708 07/16/19 0157 07/17/19 0557 07/18/19 0446  WBC 6.3 6.1 5.5 6.6  NEUTROABS 3.9  --   --   --   HGB 9.7* 8.3* 8.5* 8.4*  HCT 33.7* 29.4* 29.8* 29.7*  MCV 79.1* 79.7* 79.7* 79.4*  PLT 282 234 235 256   Cardiac Enzymes: No results for input(s): CKTOTAL, CKMB, CKMBINDEX, TROPONINI in the last 168 hours. BNP (last 3 results) No results for input(s): PROBNP in the last 8760 hours. CBG: Recent Labs  Lab 07/17/19 1308 07/17/19 1612 07/17/19 2052 07/18/19 0734 07/18/19 1157  GLUCAP 110* 231* 97 116* 153*   D-Dimer: No results for input(s): DDIMER in the last 72 hours. Hgb A1c: No results for input(s): HGBA1C in the last 72 hours. Lipid Profile: No  results for input(s): CHOL, HDL, LDLCALC, TRIG, CHOLHDL, LDLDIRECT in the last 72 hours. Thyroid function studies: No results for input(s): TSH, T4TOTAL, T3FREE, THYROIDAB in the last 72 hours.  Invalid input(s): FREET3 Anemia work up: No results for input(s): VITAMINB12, FOLATE, FERRITIN, TIBC, IRON, RETICCTPCT in the last 72 hours. Sepsis Labs: Recent Labs  Lab 07/15/19 1708 07/16/19 0157 07/17/19 0557 07/18/19 0446  WBC 6.3 6.1 5.5 6.6    Microbiology Recent Results (from the past 240 hour(s))  Respiratory Panel by RT PCR (Flu  A&B, Covid) - Nasopharyngeal Swab     Status: None   Collection Time: 07/15/19  5:14 PM   Specimen: Nasopharyngeal Swab  Result Value Ref Range Status   SARS Coronavirus 2 by RT PCR NEGATIVE NEGATIVE Final    Comment: (NOTE) SARS-CoV-2 target nucleic acids are NOT DETECTED. The SARS-CoV-2 RNA is generally detectable in upper respiratoy specimens during the acute phase of infection. The lowest concentration of SARS-CoV-2 viral copies this assay can detect is 131 copies/mL. A negative result does not preclude SARS-Cov-2 infection and should not be used as the sole basis for treatment or other patient management decisions. A negative result may occur with  improper specimen collection/handling, submission of specimen other than nasopharyngeal swab, presence of viral mutation(s) within the areas targeted by this assay, and inadequate number of viral copies (<131 copies/mL). A negative result must be combined with clinical observations, patient history, and epidemiological information. The expected result is Negative. Fact Sheet for Patients:  PinkCheek.be Fact Sheet for Healthcare Providers:  GravelBags.it This test is not yet ap proved or cleared by the [place] FDA and  has been authorized for detection and/or diagnosis of SARS-CoV-2 by FDA under an Emergency Use Authorization (EUA). This  EUA will remain  in effect (meaning this test can be used) for the duration of the COVID-19 declaration under Section 564(b)(1) of the Act, 21 U.S.C. section 360bbb-3(b)(1), unless the authorization is terminated or revoked sooner.    Influenza A by PCR NEGATIVE NEGATIVE Final   Influenza B by PCR NEGATIVE NEGATIVE Final    Comment: (NOTE) The Xpert Xpress SARS-CoV-2/FLU/RSV assay is intended as an aid in  the diagnosis of influenza from Nasopharyngeal swab specimens and  should not be used as a sole basis for treatment. Nasal washings and  aspirates are unacceptable for Xpert Xpress SARS-CoV-2/FLU/RSV  testing. Fact Sheet for Patients: PinkCheek.be Fact Sheet for Healthcare Providers: GravelBags.it This test is not yet approved or cleared by the [place] FDA and  has been authorized for detection and/or diagnosis of SARS-CoV-2 by  FDA under an Emergency Use Authorization (EUA). This EUA will remain  in effect (meaning this test can be used) for the duration of the  Covid-19 declaration under Section 564(b)(1) of the Act, 21  U.S.C. section 360bbb-3(b)(1), unless the authorization is  terminated or revoked. Performed at [facility], [address] [place], [place] [address]     Procedures and diagnostic studies:  PERIPHERAL VASCULAR CATHETERIZATION  Result Date: 07/17/2019 See op note  ECHOCARDIOGRAM COMPLETE  Result Date: 07/17/2019    ECHOCARDIOGRAM REPORT   Patient Name:   SHAMBRIA LEMASTERS Date of Exam: 07/17/2019 Medical Rec #:  [id number]      Height:       66.0 in Accession #:    [id number]     Weight:       204.5 lb Date of Birth:  11/03/1950      BSA:          2.02 m Patient Age:    59 years       BP:           111/46 mmHg Patient Gender: F              HR:           49 bpm. Exam Location:  [facility] Procedure: 2D Echo, Cardiac Doppler and Color Doppler Indications:     I50.9 Congestive Heart Failure  History:  Patient has no prior history of Echocardiogram examinations.                  CHF, Previous Myocardial Infarction and CAD, Prior CABG, CKD;                  Risk Factors:Sleep Apnea, Hypertension, Diabetes and                  Dyslipidemia.  Sonographer:     Charmayne Sheer RDCS (AE) Referring Phys:  [id number] Jennye Boroughs Diagnosing Phys: Kathlyn Sacramento MD  Sonographer Comments: Suboptimal subcostal window. IMPRESSIONS  1. Left ventricular ejection fraction, by estimation, is 30 to 35%. The left ventricle has moderately decreased function. The left ventrical demonstrates regional wall motion abnormalities (see scoring diagram/findings for description). There is moderately increased left ventricular hypertrophy. Left ventricular diastolic parameters are consistent with Grade II diastolic dysfunction (pseudonormalization). There is severe hypokinesis of the left ventricular, entire inferior wall and inferolateral  wall.  2. Right ventricular systolic function is normal. Tricuspid regurgitation signal is inadequate for assessing RVSP.  3. Left atrial size was severely dilated. FINDINGS  Left Ventricle: Left ventricular ejection fraction, by estimation, is 30 to 35%. The left ventricle has moderately decreased function. The left ventricle demonstrates regional wall motion abnormalities. Severe hypokinesis of the left ventricular, entire  inferior wall and inferolateral wall. The left ventricular internal cavity size was normal in size. There is moderately increased left ventricular hypertrophy. Right Ventricle: The right ventricular size is normal. No increase in right ventricular wall thickness. Right ventricular systolic function is normal. Tricuspid regurgitation signal is inadequate for assessing RVSP. Left Atrium: Left atrial size was severely dilated. Right Atrium: Right atrial size was moderately dilated. Pericardium: There is no evidence of pericardial effusion. Mitral Valve: The mitral valve is normal in  structure and function. There is mild thickening of the mitral valve leaflet(s). There is mild calcification of the mitral valve leaflet(s). Normal mobility of the mitral valve leaflets. Moderate mitral annular  calcification. Trivial mitral valve regurgitation. No evidence of mitral valve stenosis. MV peak gradient, 4.9 mmHg. The mean mitral valve gradient is 1.0 mmHg. Tricuspid Valve: The tricuspid valve is normal in structure. Tricuspid valve regurgitation is trivial. No evidence of tricuspid stenosis. Aortic Valve: The aortic valve is grossly normal. Aortic valve regurgitation is not visualized. Mild to moderate aortic valve sclerosis/calcification is present, without any evidence of aortic stenosis. Aortic valve mean gradient measures 3.0 mmHg. Aortic valve peak gradient measures 6.1 mmHg. Aortic valve area, by VTI measures 2.39 cm. Pulmonic Valve: The pulmonic valve was normal in structure. Pulmonic valve regurgitation is not visualized. No evidence of pulmonic stenosis. Aorta: The aortic root is normal in size and structure. Pulmonary Artery: The pulmonary artery is of normal size. Venous: The left upper pulmonary vein, left lower pulmonary vein, right upper pulmonary vein and right lower pulmonary vein are normal. The inferior vena cava is normal in size with greater than 50% respiratory variability, suggesting right atrial pressure of 3 mmHg. IAS/Shunts: No atrial level shunt detected by color flow Doppler.  LEFT VENTRICLE PLAX 2D LVIDd:         4.76 cm       Diastology LVIDs:         3.98 cm       LV e' lateral:   6.42 cm/s LV PW:         1.23 cm       LV E/e' lateral: 16.4 LV  IVS:        0.98 cm       LV e' medial:    2.83 cm/s LVOT diam:     2.00 cm       LV E/e' medial:  37.1 LV SV:         67.86 ml LV SV Index:   17.17 LVOT Area:     3.14 cm  LV Volumes (MOD) LV area d, A2C:    41.00 cm LV area d, A4C:    39.60 cm LV area s, A2C:    32.50 cm LV area s, A4C:    33.80 cm LV major d, A2C:   9.00 cm  LV major d, A4C:   9.21 cm LV major s, A2C:   8.72 cm LV major s, A4C:   9.07 cm LV vol d, MOD A2C: 156.0 ml LV vol d, MOD A4C: 141.0 ml LV vol s, MOD A2C: 103.0 ml LV vol s, MOD A4C: 107.0 ml LV SV MOD A2C:     53.0 ml LV SV MOD A4C:     141.0 ml LV SV MOD BP:      42.6 ml RIGHT VENTRICLE RV Basal diam:  3.99 cm LEFT ATRIUM              Index       RIGHT ATRIUM           Index LA diam:        4.60 cm  2.28 cm/m  RA Area:     27.70 cm LA Vol (A2C):   71.4 ml  35.37 ml/m RA Volume:   100.00 ml 49.53 ml/m LA Vol (A4C):   122.0 ml 60.43 ml/m LA Biplane Vol: 98.0 ml  48.54 ml/m  AORTIC VALVE                   PULMONIC VALVE AV Area (Vmax):    2.42 cm    PV Vmax:       1.00 m/s AV Area (Vmean):   2.37 cm    PV Vmean:      73.400 cm/s AV Area (VTI):     2.39 cm    PV VTI:        0.219 m AV Vmax:           123.00 cm/s PV Peak grad:  4.0 mmHg AV Vmean:          86.800 cm/s PV Mean grad:  2.0 mmHg AV VTI:            0.284 m AV Peak Grad:      6.1 mmHg AV Mean Grad:      3.0 mmHg LVOT Vmax:         94.60 cm/s LVOT Vmean:        65.400 cm/s LVOT VTI:          0.216 m LVOT/AV VTI ratio: 0.76  AORTA Ao Root diam: 3.30 cm MITRAL VALVE MV Area (PHT): 2.76 cm              SHUNTS MV Peak grad:  4.9 mmHg              Systemic VTI:  0.22 m MV Mean grad:  1.0 mmHg              Systemic Diam: 2.00 cm MV Vmax:       1.11 m/s MV Vmean:      52.7 cm/s MV Decel Time: 275  msec MV E velocity: 105.00 cm/s 103 cm/s MV A velocity: 68.90 cm/s  70.3 cm/s MV E/A ratio:  1.52        1.5 Kathlyn Sacramento MD Electronically signed by Kathlyn Sacramento MD Signature Date/Time: 07/17/2019/11:38:39 AM    Final     Medications:   . apixaban  5 mg Oral BID  . aspirin EC  81 mg Oral Daily  . benazepril  40 mg Oral Daily  . cloNIDine  0.1 mg Oral BID  . ferrous sulfate  325 mg Oral QODAY  . furosemide  40 mg Oral Daily  . hydrALAZINE  50 mg Oral TID  . influenza vaccine adjuvanted  0.5 mL Intramuscular Tomorrow-1000  . insulin aspart  0-15  Units Subcutaneous TID WC  . levothyroxine  137 mcg Oral Q0600  . pantoprazole  40 mg Oral Daily  . potassium chloride  20 mEq Oral Daily  . pravastatin  40 mg Oral q1800  . vitamin B-12  1,000 mcg Oral Daily   Continuous Infusions: . sodium chloride       LOS: 3 days   Marchella Hibbard  [facility]     07/18/2019, 12:17 PM

## 2019-07-18 NOTE — Progress Notes (Signed)
[facility] Daily Progress Note   Subjective: 07/17/19: 1. Ultrasound guidance for vascular access right femoral artery 2. Catheter placement into left common femoral artery from right femoral approach 3. Aortogram and selective left lower extremity angiogram 4. Mechanical thrombectomy to the left popliteal artery, anterior tibial artery, and tibioperoneal trunk and peroneal artery with the roto-Rx device 5. Percutaneous transluminal angioplasty of the left anterior tibial artery with 3 mm diameter by 30 cm length angioplasty balloon             6.  Percutaneous transluminal angioplasty of the left peroneal artery and tibioperoneal trunk with 2.5 mm diameter by 22 cm length angioplasty balloon             7.  Viabahn stent placement to the left SFA with 6 mm diameter by 15 cm length stent for thrombus and stenosis greater than 50% 8. StarClose closure device right femoral artery  Patient without complaint this AM. No issues overnight. Had some right thigh pain - however seems to have resolved by this AM.   Objective: Vitals:   07/17/19 1529 07/17/19 1930 07/18/19 0353 07/18/19 0735  BP: (!) 141/39 (!) 113/43 (!) 166/81 (!) 126/58  Pulse: (!) 57 (!) 52 66 (!) 53  Resp: 16 20 20 19   Temp: 97.8 F (36.6 C) 98.8 F (37.1 C) 98.3 F (36.8 C) 98.4 F (36.9 C)  TempSrc: Oral Oral Oral Oral  SpO2: 98% 98% 99% 93%  Weight:   91.9 kg   Height:        Intake/Output Summary (Last 24 hours) at 07/18/2019 1105 Last data filed at 07/18/2019 0841 Gross per 24 hour  Intake 276.45 ml  Output 1525 ml  Net -1248.55 ml   Physical Exam: A&Ox3, NAD CV: RRR Pulmonary: CTA Bilaterally Abdomen: Soft, Non-tender, Non-distended Right Groin:  PAD in place. No ecchymosis, swelling or drainage noted. Right lower extremity: Thigh Soft. Calf soft.  She remains warm distally toes.  No evidence of hematoma.    Left lower extremity: Hard to palpate pedal pulses however the extremity is warm distally toes.  Thigh soft.  Calf soft.  No evidence of compartment syndrome.    Laboratory: CBC    Component Value Date/Time   WBC 6.6 07/18/2019 0446   HGB 8.4 (L) 07/18/2019 0446   HGB 8.3 (L) 02/05/2012 0833   HCT 29.7 (L) 07/18/2019 0446   HCT 26.8 (L) 01/25/2012 0837   PLT 256 07/18/2019 0446   PLT 227 02/03/2012 0426   BMET    Component Value Date/Time   NA 141 07/18/2019 0446   NA 144 02/03/2012 0426   K 4.5 07/18/2019 0446   K 4.4 02/03/2012 0426   CL 108 07/18/2019 0446   CL 113 (H) 02/03/2012 0426   CO2 25 07/18/2019 0446   CO2 24 02/03/2012 0426   GLUCOSE 123 (H) 07/18/2019 0446   GLUCOSE 153 (H) 02/03/2012 0426   BUN 35 (H) 07/18/2019 0446   BUN 22 (H) 02/03/2012 0426   CREATININE 1.87 (H) 07/18/2019 0446   CREATININE 1.76 (H) 08/18/2013 1019   CALCIUM 8.7 (L) 07/18/2019 0446   CALCIUM 8.1 (L) 02/03/2012 0426   GFRNONAA 27 (L) 07/18/2019 0446   GFRNONAA 30 (L) 08/18/2013 1019   GFRAA 31 (L) 07/18/2019 0446   GFRAA 35 (L) 08/18/2013 1019   Assessment/Planning: The patient is a 69 year old female admitted with peripheral artery disease associated with rest pain / acute on chronic ischemia to left lower extremity  status post endovascular intervention - POD#1  1) Aggrastat stopped at 8am. ASA / Eliquis to start at 10:00am. CBC in AM. 2) Encouraged out of bed to chair and ambulation today.  Patient asking for a walker.  Will consult PT/OT for recommendations.  Discussed with Dr. Ellis Parents Keshav Winegar PA-C 07/18/2019 11:05 AM

## 2019-07-19 DIAGNOSIS — I998 Other disorder of circulatory system: Secondary | ICD-10-CM

## 2019-07-19 DIAGNOSIS — E1151 Type 2 diabetes mellitus with diabetic peripheral angiopathy without gangrene: Principal | ICD-10-CM

## 2019-07-19 DIAGNOSIS — M79672 Pain in left foot: Secondary | ICD-10-CM

## 2019-07-19 LAB — BASIC METABOLIC PANEL
Anion gap: 10 (ref 5–15)
BUN: 41 mg/dL — ABNORMAL HIGH (ref 8–23)
CO2: 24 mmol/L (ref 22–32)
Calcium: 8.5 mg/dL — ABNORMAL LOW (ref 8.9–10.3)
Chloride: 105 mmol/L (ref 98–111)
Creatinine, Ser: 1.87 mg/dL — ABNORMAL HIGH (ref 0.44–1.00)
GFR calc Af Amer: 31 mL/min — ABNORMAL LOW (ref >60)
GFR calc non Af Amer: 27 mL/min — ABNORMAL LOW (ref >60)
Glucose, Bld: 150 mg/dL — ABNORMAL HIGH (ref 70–99)
Potassium: 4.5 mmol/L (ref 3.5–5.1)
Sodium: 139 mmol/L (ref 135–145)

## 2019-07-19 LAB — CBC
HCT: 29.2 % — ABNORMAL LOW (ref 36.0–46.0)
Hemoglobin: 8.6 g/dL — ABNORMAL LOW (ref 12.0–15.0)
MCH: 22.9 pg — ABNORMAL LOW (ref 26.0–34.0)
MCHC: 29.5 g/dL — ABNORMAL LOW (ref 30.0–36.0)
MCV: 77.9 fL — ABNORMAL LOW (ref 80.0–100.0)
Platelets: 252 10*3/uL (ref 150–400)
RBC: 3.75 MIL/uL — ABNORMAL LOW (ref 3.87–5.11)
RDW: 16.4 % — ABNORMAL HIGH (ref 11.5–15.5)
WBC: 8 10*3/uL (ref 4.0–10.5)
nRBC: 0 % (ref 0.0–0.2)

## 2019-07-19 LAB — GLUCOSE, CAPILLARY: Glucose-Capillary: 134 mg/dL — ABNORMAL HIGH (ref 70–99)

## 2019-07-19 LAB — MAGNESIUM: Magnesium: 2.2 mg/dL (ref 1.7–2.4)

## 2019-07-19 MED ORDER — APIXABAN 5 MG PO TABS: 5.0000 mg | ORAL_TABLET | Freq: Two times a day (BID) | ORAL | 1 refills | Status: DC

## 2019-07-19 MED ORDER — HYDRALAZINE HCL 25 MG PO TABS: 25.0000 mg | ORAL_TABLET | Freq: Two times a day (BID) | ORAL | 0 refills | Status: DC

## 2019-07-19 MED ORDER — PANTOPRAZOLE SODIUM 40 MG PO TBEC: 40.0000 mg | DELAYED_RELEASE_TABLET | Freq: Every day | ORAL | 0 refills | Status: DC

## 2019-07-19 MED ORDER — TRAMADOL HCL 50 MG PO TABS: 50.0000 mg | ORAL_TABLET | Freq: Two times a day (BID) | ORAL | 0 refills | Status: AC | PRN

## 2019-07-19 NOTE — Discharge Summary (Signed)
[facility] NAME: [name]    MR#:  [id number]  DATE OF BIRTH:  [birth year]  DATE OF ADMISSION:  07/15/2019 ADMITTING PHYSICIAN: Jennye Boroughs, MD  DATE OF DISCHARGE: 07/19/2019  PRIMARY CARE PHYSICIAN: Kirk Ruths, MD    ADMISSION DIAGNOSIS:  PAD (peripheral artery disease) ([place]) [I73.9] Critical lower limb ischemia [I99.8] Ischemic foot [I99.8] Type 2 diabetes mellitus with diabetic peripheral angiopathy without gangrene, without long-term current use of insulin (HCC) [E11.51]  DISCHARGE DIAGNOSIS:  PAD with Left LE rest pain/ Acute left ischemic foot s/p Mechanical thrombectomy and PTA  SECONDARY DIAGNOSIS:   Past Medical History:  Diagnosis Date  . Anemia   . Anemia, iron deficiency 11/07/2013  . Arthritis   . CAD (coronary artery disease)   . Cerebrovascular accident ([place])   . CHF (congestive heart failure) ([place])   . Chicken pox   . Chronic kidney disease   . Colitis    ? at 50  . Diabetes mellitus   . Hashimoto thyroiditis    w subsequent hypothyroidism  . Headache   . HTN (hypertension)   . Hyperlipidemia   . Hypothyroidism   . Myocardial infarction ([place])   . 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 Sleep apnea     HOSPITAL COURSE:  [name] is an 69 y.o. female medical history significant for CAD status post CABG 20 years ago, stroke about 20 years ago, CHF ( recently received IVLasix in ED on 06/30/2019), history of gastric ulcer, iron deficiency anemia, arthritis, diabetes mellitus, hypertension, hypothyroidism, hyperlipidemia, CKD stage 3b, sleep apnea, presented to the hospital because of pain in the left leg and left foot  # Peripheral vascular disease with rest pain/acute ischemic left leg/foot:  -s/p mechanical thrombectomy and percutaneous transluminal angioplasty to the left anterior tibial artery, tibioperoneal trunk, peroneal artery and Viabahn stent  placement to the left SFA.  - She also had mechanical thrombectomy to the left popliteal artery on 07/17/2019 by dr Lucky Cowboy -Continue Eliquis and aspirin.   - tramadol as needed for pain.  pt says oxycodone make her sleepy and ok to take tramdol (teken for her knee sx before) -ok from vascular standpoint for d/c--f/u as out pt  #Type 2 diabetes mellitus with hyperglycemia:  -resume  Glimepiride at discharge .  Glucose levels improved.  - Continue  NovoLog as needed.  # CAD s/p CABG and history of stroke:  -Continue aspirin and statin  #Chronic systolic and diastolic CHF:  -2D echo on 07/17/2019 showed EF estimated at 30 to [id number], grade 2 diastolic dysfunction.Compensated.   -Continue Lasix.  She is already on benazepril.  Unfortunately, she is bradycardic with heart rate between 50 and 55.  No beta-blockers for now.  Patient said she has an appointment to go to the heart failure clinic on 08/01/2019  #Sinus bradycardia/first-degree AV block/left bundle branch block: No beta-blockers for now. -asymptomatic  #Hypertension: Continue antihypertensives  #CKD stage IIIb: Creatinine trending up slightly.  Continue to monitor.  # hypothyroidism -on synthorid  # chronic Irondeficiency anemia  -cont oral home dose iron. hgb stable -on PPI gor h/o PUD  DVT prophylaxis: Eliquis code Status: Full code Family Communication: Plan discussed with patient Disposition Plan: discharge to home today. No PT or nany home needs. Ok from vascular standpoint to go home with out pt f/u. Pt agrees with plan.  CONSULTS OBTAINED:  Treatment Team:  Algernon Huxley, MD  DRUG ALLERGIES:   Allergies  Allergen Reactions  . Niacin Other (See Comments)  . Ultram [Tramadol Hcl]     Intolerance, makes woozy  . Baclofen Other (See Comments)    Dizziness  . Cardura [Doxazosin Mesylate] Other (See Comments)    Dizzy with passing out spells  . Morphine And Related Nausea And Vomiting  . Other Rash    Gold metal  (nickel in the gold)    DISCHARGE MEDICATIONS:   Allergies as of 07/19/2019      Reactions   Niacin Other (See Comments)   Ultram [tramadol Hcl]    Intolerance, makes woozy   Baclofen Other (See Comments)   Dizziness   Cardura [doxazosin Mesylate] Other (See Comments)   Dizzy with passing out spells   Morphine And Related Nausea And Vomiting   Other Rash   Gold metal (nickel in the gold)      Medication List    TAKE these medications   apixaban 5 MG Tabs tablet Commonly known as: ELIQUIS Take 1 tablet (5 mg total) by mouth 2 (two) times daily.   aspirin EC 81 MG tablet Take 81 mg by mouth daily.   benazepril 40 MG tablet Commonly known as: LOTENSIN TAKE ONE TABLET BY MOUTH ONCE DAILY   cloNIDine 0.1 MG tablet Commonly known as: CATAPRES Take 0.1 mg by mouth 2 (two) times daily.   furosemide 40 MG tablet Commonly known as: LASIX Take 1 tablet (40 mg total) by mouth daily.   glimepiride 1 MG tablet Commonly known as: AMARYL Take 1 tablet by mouth daily.   hydrALAZINE 25 MG tablet Commonly known as: APRESOLINE Take 1 tablet (25 mg total) by mouth 2 (two) times daily.   levothyroxine 137 MCG tablet Commonly known as: SYNTHROID Take 137 mcg by mouth daily before breakfast.   lovastatin 20 MG tablet Commonly known as: MEVACOR Take 2 tablets (40 mg total) by mouth at bedtime.   pantoprazole 40 MG tablet Commonly known as: PROTONIX Take 1 tablet (40 mg total) by mouth daily. Start taking on: July 20, 2019   potassium chloride 10 MEQ tablet Commonly known as: KLOR-CON Take 2 tablets (20 mEq total) by mouth daily.   Slow Fe 142 (45 Fe) MG Tbcr Generic drug: Ferrous Sulfate Take 1 tablet by mouth 2 (two) times a week.   traMADol 50 MG tablet Commonly known as: Ultram Take 1 tablet (50 mg total) by mouth every 12 (twelve) hours as needed for severe pain.   vitamin B-12 1000 MCG tablet Commonly known as: CYANOCOBALAMIN Take 1,000 mcg by mouth daily.        If you experience worsening of your admission symptoms, develop shortness of breath, life threatening emergency, suicidal or homicidal thoughts you must seek medical attention immediately by calling 911 or calling your MD immediately  if symptoms less severe.  You Must read complete instructions/literature along with all the possible adverse reactions/side effects for all the Medicines you take and that have been prescribed to you. Take any new Medicines after you have completely understood and accept all the possible adverse reactions/side effects.   Please note  You were cared for by a hospitalist during your hospital stay. If you have any questions about your discharge medications or the care you received while you were in the hospital after you are discharged, you can call the unit and asked to speak with the hospitalist on call if the hospitalist that took care of you is not available. Once you  are discharged, your primary care physician will handle any further medical issues. Please note that NO REFILLS for any discharge medications will be authorized once you are discharged, as it is imperative that you return to your primary care physician (or establish a relationship with a primary care physician if you do not have one) for your aftercare needs so that they can reassess your need for medications and monitor your lab values. Today   SUBJECTIVE   Left  Thigh pain--no rest pain. Had chronic back issues  VITAL SIGNS:  Blood pressure 120/60, pulse 62, temperature 98.2 F (36.8 C), temperature source Oral, resp. rate 18, height 5\' 6"  (1.676 m), weight 90.2 kg, SpO2 96 %.  I/O:    Intake/Output Summary (Last 24 hours) at 07/19/2019 0906 Last data filed at 07/19/2019 [id number] Gross per 24 hour  Intake 480 ml  Output 900 ml  Net -420 ml    PHYSICAL EXAMINATION:  GENERAL:  69 y.o.-year-old patient lying in the bed with no acute distress.  EYES: Pupils equal, round, reactive to light  and accommodation. No scleral icterus.  HEENT: Head atraumatic, normocephalic. Oropharynx and nasopharynx clear.  NECK:  Supple, no jugular venous distention. No thyroid enlargement, no tenderness.  LUNGS: Normal breath sounds bilaterally, no wheezing, rales,rhonchi or crepitation. No use of accessory muscles of respiration.  CARDIOVASCULAR: S1, S2 normal. No murmurs, rubs, or gallops.  ABDOMEN: Soft, non-tender, non-distended. Bowel sounds present. No organomegaly or mass.  EXTREMITIES: No pedal edema, cyanosis, or clubbing.  NEUROLOGIC: Cranial nerves II through XII are intact. Muscle strength 5/5 in all extremities. Sensation intact. Gait not checked.  PSYCHIATRIC: The patient is alert and oriented x 3.  SKIN: No obvious rash, lesion, or ulcer.   DATA REVIEW:   CBC  Recent Labs  Lab 07/19/19 0211  WBC 8.0  HGB 8.6*  HCT 29.2*  PLT 252    Chemistries  Recent Labs  Lab 07/15/19 1632 07/16/19 0157 07/19/19 0211  NA 141   < > 139  K 4.6   < > 4.5  CL 109   < > 105  CO2 24   < > 24  GLUCOSE 193*   < > 150*  BUN 41*   < > 41*  CREATININE 1.75*   < > 1.87*  CALCIUM 8.7*   < > 8.5*  MG  --   --  2.2  AST 27  --   --   ALT 14  --   --   ALKPHOS 118  --   --   BILITOT 0.6  --   --    < > = values in this interval not displayed.    Microbiology Results   Recent Results (from the past 240 hour(s))  Respiratory Panel by RT PCR (Flu A&B, Covid) - Nasopharyngeal Swab     Status: None   Collection Time: 07/15/19  5:14 PM   Specimen: Nasopharyngeal Swab  Result Value Ref Range Status   SARS Coronavirus 2 by RT PCR NEGATIVE NEGATIVE Final    Comment: (NOTE) SARS-CoV-2 target nucleic acids are NOT DETECTED. The SARS-CoV-2 RNA is generally detectable in upper respiratoy specimens during the acute phase of infection. The lowest concentration of SARS-CoV-2 viral copies this assay can detect is 131 copies/mL. A negative result does not preclude SARS-Cov-2 infection and should  not be used as the sole basis for treatment or other patient management decisions. A negative result may occur with  improper specimen collection/handling, submission of specimen other  than nasopharyngeal swab, presence of viral mutation(s) within the areas targeted by this assay, and inadequate number of viral copies (<131 copies/mL). A negative result must be combined with clinical observations, patient history, and epidemiological information. The expected result is Negative. Fact Sheet for Patients:  PinkCheek.be Fact Sheet for Healthcare Providers:  GravelBags.it This test is not yet ap proved or cleared by the [place] FDA and  has been authorized for detection and/or diagnosis of SARS-CoV-2 by FDA under an Emergency Use Authorization (EUA). This EUA will remain  in effect (meaning this test can be used) for the duration of the COVID-19 declaration under Section 564(b)(1) of the Act, 21 U.S.C. section 360bbb-3(b)(1), unless the authorization is terminated or revoked sooner.    Influenza A by PCR NEGATIVE NEGATIVE Final   Influenza B by PCR NEGATIVE NEGATIVE Final    Comment: (NOTE) The Xpert Xpress SARS-CoV-2/FLU/RSV assay is intended as an aid in  the diagnosis of influenza from Nasopharyngeal swab specimens and  should not be used as a sole basis for treatment. Nasal washings and  aspirates are unacceptable for Xpert Xpress SARS-CoV-2/FLU/RSV  testing. Fact Sheet for Patients: PinkCheek.be Fact Sheet for Healthcare Providers: GravelBags.it This test is not yet approved or cleared by the [place] FDA and  has been authorized for detection and/or diagnosis of SARS-CoV-2 by  FDA under an Emergency Use Authorization (EUA). This EUA will remain  in effect (meaning this test can be used) for the duration of the  Covid-19 declaration under Section 564(b)(1)  of the Act, 21  U.S.C. section 360bbb-3(b)(1), unless the authorization is  terminated or revoked. Performed at [facility], [address] [place], [place] [address]     RADIOLOGY:  PERIPHERAL VASCULAR CATHETERIZATION  Result Date: 07/17/2019 See op note    CODE STATUS:     Code Status Orders  (From admission, onward)         Start     Ordered   07/15/19 2015  Full code  Continuous     07/15/19 2015        Code Status History    Date Active Date Inactive Code Status Order ID Comments User Context   09/01/2017 1755 09/03/2017 1859 Full Code [id number]  SalaryAvel Peace, MD Inpatient   Advance Care Planning Activity       TOTAL TIME TAKING CARE OF THIS PATIENT: 40  minutes.    Fritzi Mandes M.D  [facility]    CC: Primary care physician; Kirk Ruths, MD

## 2019-07-19 NOTE — Progress Notes (Signed)
Removed PAD without any complications.

## 2019-07-19 NOTE — Plan of Care (Signed)
  Problem: Clinical Measurements: Goal: Ability to maintain clinical measurements within normal limits will improve Outcome: Adequate for Discharge Goal: Will remain free from infection Outcome: Adequate for Discharge Goal: Diagnostic test results will improve Outcome: Adequate for Discharge   

## 2019-07-19 NOTE — Progress Notes (Signed)
Patient given discharge instructions. IV's taken out and tele monitor off. Patient verbalized understanding without any questions or concerns. Family picking up patient.

## 2019-07-31 NOTE — Progress Notes (Signed)
Patient ID: [name], female    DOB: 05-25-1951, 69 y.o.   MRN: [id number]  HPI  [name] is a 69 y/o female with a history of CAD, DM, HTN, CKD, stroke, thyroid disease, hyperlipidemia, sleep apnea, anemia and heart failure.   Echo report from 07/17/19 reviewed and showed an EF of 30-35% along with severely dilated left atria. Echo report from 10/26/17 reviewed and showed an EF of 40% along with moderate MR and mild TR.   Admitted 07/15/19 due to peripheral vascular disease with rest pain/acute ischemic left leg/foot. Vascular consult obtained. Had mechanical thrombectomy and percutaneous transluminal angioplasty to the left anterior tibial artery, tibioperoneal trunk, peroneal artery and Viabahn stent placement to the left SFA. Discharged after 4 days. Was in the ED 06/30/19 due to acute on chronic heart failure. Given IV lasix and released.   She presents today for a follow-up visit with a chief complaint of minimal fatigue upon moderate exertion. She describes this as chronic in nature having been present for several years. She has associated nausea, weakness and difficulty sleeping along with this. She denies any dizziness, abdominal distention, palpitations, pedal edema, chest pain, shortness of breath, cough or weight gain.   Says that her left foot/ leg feels "great" and has no pain. Has been out of eliquis for a couple of weeks because she said that she couldn't afford it.   Past Medical History:  Diagnosis Date  . Anemia   . Anemia, iron deficiency 11/07/2013  . Arthritis   . CAD (coronary artery disease)   . Cerebrovascular accident ([place])   . CHF (congestive heart failure) ([place])   . Chicken pox   . Chronic kidney disease   . Colitis    ? at 63  . Diabetes mellitus   . Hashimoto thyroiditis    w subsequent hypothyroidism  . Headache   . HTN (hypertension)   . Hyperlipidemia   . Hypothyroidism   . Myocardial infarction ([place])   . Postoperative malabsorption - s/p gastric  bypass roux-en-Y 11/07/2013  . Sleep apnea    Past Surgical History:  Procedure Laterality Date  . ABDOMINAL HYSTERECTOMY    . CHOLECYSTECTOMY    . COLONOSCOPY WITH PROPOFOL N/A 12/24/2014   Procedure: COLONOSCOPY WITH PROPOFOL;  Surgeon: Hulen Luster, MD;  Location[facility] ENDOSCOPY;  Service: Gastroenterology;  Laterality: N/A;  . CORONARY ARTERY BYPASS GRAFT    . ESOPHAGOGASTRODUODENOSCOPY N/A 09/02/2017   Procedure: ESOPHAGOGASTRODUODENOSCOPY (EGD);  Surgeon: Virgel Manifold, MD;  Location: [facility] ENDOSCOPY;  Service: Endoscopy;  Laterality: N/A;  . ESOPHAGOGASTRODUODENOSCOPY (EGD) WITH PROPOFOL N/A 12/24/2014   Procedure: ESOPHAGOGASTRODUODENOSCOPY (EGD) WITH PROPOFOL;  Surgeon: Hulen Luster, MD;  Location[facility] ENDOSCOPY;  Service: Gastroenterology;  Laterality: N/A;  . JOINT REPLACEMENT    . LOWER EXTREMITY ANGIOGRAPHY Left 07/17/2019   Procedure: Lower Extremity Angiography;  Surgeon: Algernon Huxley, MD;  Location: [place] CV LAB;  Service: Cardiovascular;  Laterality: Left;  . ROUX-EN-Y GASTRIC BYPASS     loss over 100 pounds  . TONSILLECTOMY    . TOTAL KNEE ARTHROPLASTY Left    Family History  Problem Relation Age of Onset  . Coronary artery disease Other   . Stroke Maternal Grandmother   . Diabetes Paternal Grandmother        diabetic coma  . Heart attack Father   . Heart attack Mother   . Heart attack Maternal Grandfather   . Heart attack Paternal Grandfather   . Breast cancer  Neg Hx    Social History   Tobacco Use  . Smoking status: Never Smoker  . Smokeless tobacco: Never Used  Substance Use Topics  . Alcohol use: No   Allergies  Allergen Reactions  . Niacin Other (See Comments)  . Ultram [Tramadol Hcl]     Intolerance, makes woozy  . Baclofen Other (See Comments)    Dizziness  . Cardura [Doxazosin Mesylate] Other (See Comments)    Dizzy with passing out spells  . Morphine And Related Nausea And Vomiting  . Other Rash    Gold metal (nickel in the gold)    Prior to Admission medications   Medication Sig Start Date End Date Taking? Authorizing Provider  aspirin EC 81 MG tablet Take 81 mg by mouth daily.    Yes [provider]  benazepril (LOTENSIN) 40 MG tablet TAKE ONE TABLET BY MOUTH ONCE DAILY Patient taking differently: Take 40 mg by mouth daily.  08/24/14  Yes Susy Frizzle, MD  cloNIDine (CATAPRES) 0.1 MG tablet Take 0.1 mg by mouth 2 (two) times daily.   Yes [provider]  Ferrous Sulfate (SLOW FE) 142 (45 Fe) MG TBCR Take 1 tablet by mouth 2 (two) times a week.   Yes [provider]  furosemide (LASIX) 40 MG tablet Take 1 tablet (40 mg total) by mouth daily. 07/03/19  Yes Valla Pacey A, FNP  glimepiride (AMARYL) 1 MG tablet Take 1 tablet by mouth daily. 06/15/17  Yes [provider]  hydrALAZINE (APRESOLINE) 25 MG tablet Take 1 tablet (25 mg total) by mouth 2 (two) times daily. 07/19/19  Yes Fritzi Mandes, MD  levothyroxine (SYNTHROID) 137 MCG tablet Take 137 mcg by mouth daily before breakfast.   Yes [provider]  lovastatin (MEVACOR) 20 MG tablet Take 2 tablets (40 mg total) by mouth at bedtime. 02/06/14  Yes Susy Frizzle, MD  pantoprazole (PROTONIX) 40 MG tablet Take 1 tablet (40 mg total) by mouth daily. 07/20/19  Yes Fritzi Mandes, MD  potassium chloride (KLOR-CON) 10 MEQ tablet Take 2 tablets (20 mEq total) by mouth daily. 07/03/19  Yes Alisa Graff, FNP  traMADol (ULTRAM) 50 MG tablet Take 1 tablet (50 mg total) by mouth every 12 (twelve) hours as needed for severe pain. 07/19/19 07/18/20 Yes Fritzi Mandes, MD  vitamin B-12 (CYANOCOBALAMIN) 1000 MCG tablet Take 1,000 mcg by mouth daily.   Yes [provider]  apixaban (ELIQUIS) 5 MG TABS tablet Take 1 tablet (5 mg total) by mouth 2 (two) times daily. Patient not taking: Reported on 08/01/2019 07/19/19   Fritzi Mandes, MD     Review of Systems  Constitutional: Positive for fatigue. Negative for appetite change.  HENT: Negative  for congestion, postnasal drip and sore throat.   Eyes: Negative.   Respiratory: Negative for cough and shortness of breath.   Cardiovascular: Negative for chest pain, palpitations and leg swelling.  Gastrointestinal: Positive for nausea (at times). Negative for abdominal distention and abdominal pain.  Endocrine: Negative.   Genitourinary: Negative.   Musculoskeletal: Negative for back pain and neck pain.  Skin: Negative.   Allergic/Immunologic: Negative.   Neurological: Positive for weakness. Negative for dizziness and light-headedness.  Hematological: Negative for adenopathy. Does not bruise/bleed easily.  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 Negative for dysphoric mood. The patient is not nervous/anxious.    Vitals:   08/01/19 0857  BP: 130/62  Pulse: 72  Resp: 16  SpO2:  100%  Weight: 201 lb 12.8 oz (91.5 kg)  Height: 5\' 6"  (1.676 m)   Wt Readings from Last 3 Encounters:  08/01/19 201 lb 12.8 oz (91.5 kg)  07/19/19 198 lb 12.8 oz (90.2 kg)  07/03/19 210 lb (95.3 kg)   Lab Results  Component Value Date   CREATININE 1.87 (H) 07/19/2019   CREATININE 1.87 (H) 07/18/2019   CREATININE 1.63 (H) 07/17/2019    Physical Exam Vitals and nursing note reviewed.  Constitutional:      Appearance: She is well-developed.  HENT:     Head: Normocephalic and atraumatic.  Eyes:     Extraocular Movements: Extraocular movements intact.     Pupils: Pupils are equal, round, and reactive to light.  Neck:     Vascular: No JVD.  Cardiovascular:     Rate and Rhythm: Normal rate and regular rhythm.  Pulmonary:     Effort: Pulmonary effort is normal. No respiratory distress.     Breath sounds: No wheezing, rhonchi or rales.  Abdominal:     Palpations: Abdomen is soft.     Tenderness: There is no abdominal tenderness.  Musculoskeletal:     Cervical back: Normal range of motion and neck supple.     Right  lower leg: No tenderness. No edema.     Left lower leg: No tenderness. No edema.  Skin:    General: Skin is warm and dry.  Neurological:     General: No focal deficit present.     Mental Status: She is alert and oriented to person, place, and time.  Psychiatric:        Mood and Affect: Mood normal.        Behavior: Behavior normal.     Assessment & Plan:  1: Chronic heart failure with reduced ejection fraction- - NYHA class II - euvolemic today - weighing daily; reminded to call for an overnight weight gain of >2 pounds or a weekly weight gain of >5 pounds - weight down 9 pounds from last visit here ~ 1 month ago - not adding salt and tries to eat low sodium and has been diligent about reading food labels - has been bradycardic in the past so currently not on a beta-blocker - consider changing benazepril to entresto in the future if able; will need to watch renal function - saw cardiology Nehemiah Massed) 09/28/17; appt scheduled with him on 08/03/19 - 2 weeks samples of eliquis 5mg  given along with copay card to see if she can use that; she is going to discuss with Dr. Nehemiah Massed if she can take warfarin instead due to cost - BNP 06/30/19 was 3314.0 - has gotten her flu vaccine   2: HTN- - BP looks good today - saw PCP Ouida Sills) 07/12/19 - BMP 07/19/19 reviewed and showed sodium 139, potassium 4.5, creatinine 1.87 and GFR 27  3: DM- - not checking her glucose daily - A1c 11/23/2018 was 7.4%  4: PVD- - s/p thrombectomy to left popliteal artery on 07/17/19 - sees vascular Owens Shark) March 2021 - 2 weeks samples of eliquis 5mg  given to patient today   Patient did not bring her medications nor a list. Each medication was verbally reviewed with the patient and she was encouraged to bring the bottles to every visit to confirm accuracy of list.  Return in 2 months or sooner for any questions/problems before then.

## 2019-08-01 ENCOUNTER — Encounter: Payer: Self-pay | Admitting: Family

## 2019-08-01 ENCOUNTER — Ambulatory Visit: Payer: Medicare Other | Attending: Family | Admitting: Family

## 2019-08-01 ENCOUNTER — Other Ambulatory Visit: Payer: Self-pay

## 2019-08-01 VITALS — BP 130/62 | HR 72 | Resp 16 | Ht 66.0 in | Wt 201.8 lb

## 2019-08-01 DIAGNOSIS — Z7982 Long term (current) use of aspirin: Secondary | ICD-10-CM | POA: Insufficient documentation

## 2019-08-01 DIAGNOSIS — Z885 Allergy status to narcotic agent status: Secondary | ICD-10-CM | POA: Diagnosis not present

## 2019-08-01 DIAGNOSIS — I252 Old myocardial infarction: Secondary | ICD-10-CM | POA: Insufficient documentation

## 2019-08-01 DIAGNOSIS — R531 Weakness: Secondary | ICD-10-CM | POA: Insufficient documentation

## 2019-08-01 DIAGNOSIS — E785 Hyperlipidemia, unspecified: Secondary | ICD-10-CM | POA: Insufficient documentation

## 2019-08-01 DIAGNOSIS — Z833 Family history of diabetes mellitus: Secondary | ICD-10-CM | POA: Insufficient documentation

## 2019-08-01 DIAGNOSIS — Z7901 Long term (current) use of anticoagulants: Secondary | ICD-10-CM | POA: Insufficient documentation

## 2019-08-01 DIAGNOSIS — Z7984 Long term (current) use of oral hypoglycemic drugs: Secondary | ICD-10-CM | POA: Insufficient documentation

## 2019-08-01 DIAGNOSIS — R110 Nausea: Secondary | ICD-10-CM | POA: Diagnosis not present

## 2019-08-01 DIAGNOSIS — Z8249 Family history of ischemic heart disease and other diseases of the circulatory system: Secondary | ICD-10-CM | POA: Diagnosis not present

## 2019-08-01 DIAGNOSIS — G4730 Sleep apnea, unspecified: Secondary | ICD-10-CM | POA: Diagnosis not present

## 2019-08-01 DIAGNOSIS — Z79899 Other long term (current) drug therapy: Secondary | ICD-10-CM | POA: Insufficient documentation

## 2019-08-01 DIAGNOSIS — I130 Hypertensive heart and chronic kidney disease with heart failure and stage 1 through stage 4 chronic kidney disease, or unspecified chronic kidney disease: Secondary | ICD-10-CM | POA: Insufficient documentation

## 2019-08-01 DIAGNOSIS — E063 Autoimmune thyroiditis: Secondary | ICD-10-CM | POA: Diagnosis not present

## 2019-08-01 DIAGNOSIS — I2510 Atherosclerotic heart disease of native coronary artery without angina pectoris: Secondary | ICD-10-CM | POA: Diagnosis not present

## 2019-08-01 DIAGNOSIS — I5022 Chronic systolic (congestive) heart failure: Secondary | ICD-10-CM | POA: Insufficient documentation

## 2019-08-01 DIAGNOSIS — Z79890 Hormone replacement therapy (postmenopausal): Secondary | ICD-10-CM | POA: Diagnosis not present

## 2019-08-01 DIAGNOSIS — E1151 Type 2 diabetes mellitus with diabetic peripheral angiopathy without gangrene: Secondary | ICD-10-CM | POA: Insufficient documentation

## 2019-08-01 DIAGNOSIS — E1122 Type 2 diabetes mellitus with diabetic chronic kidney disease: Secondary | ICD-10-CM | POA: Insufficient documentation

## 2019-08-01 DIAGNOSIS — Z951 Presence of aortocoronary bypass graft: Secondary | ICD-10-CM | POA: Insufficient documentation

## 2019-08-01 DIAGNOSIS — I739 Peripheral vascular disease, unspecified: Secondary | ICD-10-CM

## 2019-08-01 DIAGNOSIS — Z8673 Personal history of transient ischemic attack (TIA), and cerebral infarction without residual deficits: Secondary | ICD-10-CM | POA: Diagnosis not present

## 2019-08-01 DIAGNOSIS — I10 Essential (primary) hypertension: Secondary | ICD-10-CM

## 2019-08-01 DIAGNOSIS — Z9884 Bariatric surgery status: Secondary | ICD-10-CM | POA: Diagnosis not present

## 2019-08-01 DIAGNOSIS — N189 Chronic kidney disease, unspecified: Secondary | ICD-10-CM | POA: Diagnosis not present

## 2019-08-01 NOTE — Patient Instructions (Addendum)
Continue weighing daily and call for an overnight weight gain of > 2 pounds or a weekly weight gain of >5 pounds.  Go see cardiology, Dr. Nehemiah Massed, this Thursday at 11:00am. Call his office if you need to reschedule at  [phone]

## 2019-08-03 DIAGNOSIS — I5023 Acute on chronic systolic (congestive) heart failure: Secondary | ICD-10-CM | POA: Insufficient documentation

## 2019-08-03 HISTORY — DX: Acute on chronic systolic (congestive) heart failure: I50.23

## 2019-08-15 ENCOUNTER — Other Ambulatory Visit (INDEPENDENT_AMBULATORY_CARE_PROVIDER_SITE_OTHER): Payer: Self-pay | Admitting: Vascular Surgery

## 2019-08-15 DIAGNOSIS — Z95820 Peripheral vascular angioplasty status with implants and grafts: Secondary | ICD-10-CM

## 2019-08-16 ENCOUNTER — Ambulatory Visit (INDEPENDENT_AMBULATORY_CARE_PROVIDER_SITE_OTHER): Payer: No Typology Code available for payment source | Admitting: Nurse Practitioner

## 2019-08-16 ENCOUNTER — Encounter (INDEPENDENT_AMBULATORY_CARE_PROVIDER_SITE_OTHER): Payer: Medicare Other

## 2019-09-26 ENCOUNTER — Ambulatory Visit: Payer: Medicare Other | Admitting: Family

## 2019-09-28 ENCOUNTER — Encounter: Payer: Self-pay | Admitting: Family

## 2019-09-28 ENCOUNTER — Ambulatory Visit: Payer: Medicare Other | Attending: Family | Admitting: Family

## 2019-09-28 ENCOUNTER — Other Ambulatory Visit: Payer: Self-pay

## 2019-09-28 VITALS — BP 134/71 | HR 71 | Resp 18 | Ht 66.0 in | Wt 208.1 lb

## 2019-09-28 DIAGNOSIS — Z7982 Long term (current) use of aspirin: Secondary | ICD-10-CM | POA: Insufficient documentation

## 2019-09-28 DIAGNOSIS — Z79899 Other long term (current) drug therapy: Secondary | ICD-10-CM | POA: Diagnosis not present

## 2019-09-28 DIAGNOSIS — Z79890 Hormone replacement therapy (postmenopausal): Secondary | ICD-10-CM | POA: Diagnosis not present

## 2019-09-28 DIAGNOSIS — Z951 Presence of aortocoronary bypass graft: Secondary | ICD-10-CM | POA: Diagnosis not present

## 2019-09-28 DIAGNOSIS — D509 Iron deficiency anemia, unspecified: Secondary | ICD-10-CM | POA: Insufficient documentation

## 2019-09-28 DIAGNOSIS — G4730 Sleep apnea, unspecified: Secondary | ICD-10-CM | POA: Diagnosis not present

## 2019-09-28 DIAGNOSIS — E1122 Type 2 diabetes mellitus with diabetic chronic kidney disease: Secondary | ICD-10-CM | POA: Diagnosis not present

## 2019-09-28 DIAGNOSIS — Z833 Family history of diabetes mellitus: Secondary | ICD-10-CM | POA: Insufficient documentation

## 2019-09-28 DIAGNOSIS — I5022 Chronic systolic (congestive) heart failure: Secondary | ICD-10-CM | POA: Insufficient documentation

## 2019-09-28 DIAGNOSIS — Z96652 Presence of left artificial knee joint: Secondary | ICD-10-CM | POA: Diagnosis not present

## 2019-09-28 DIAGNOSIS — Z9884 Bariatric surgery status: Secondary | ICD-10-CM | POA: Diagnosis not present

## 2019-09-28 DIAGNOSIS — I130 Hypertensive heart and chronic kidney disease with heart failure and stage 1 through stage 4 chronic kidney disease, or unspecified chronic kidney disease: Secondary | ICD-10-CM | POA: Diagnosis not present

## 2019-09-28 DIAGNOSIS — Z8249 Family history of ischemic heart disease and other diseases of the circulatory system: Secondary | ICD-10-CM | POA: Diagnosis not present

## 2019-09-28 DIAGNOSIS — I10 Essential (primary) hypertension: Secondary | ICD-10-CM

## 2019-09-28 DIAGNOSIS — Z888 Allergy status to other drugs, medicaments and biological substances status: Secondary | ICD-10-CM | POA: Insufficient documentation

## 2019-09-28 DIAGNOSIS — Z7984 Long term (current) use of oral hypoglycemic drugs: Secondary | ICD-10-CM | POA: Diagnosis not present

## 2019-09-28 DIAGNOSIS — I2510 Atherosclerotic heart disease of native coronary artery without angina pectoris: Secondary | ICD-10-CM | POA: Insufficient documentation

## 2019-09-28 DIAGNOSIS — N189 Chronic kidney disease, unspecified: Secondary | ICD-10-CM | POA: Diagnosis not present

## 2019-09-28 DIAGNOSIS — I252 Old myocardial infarction: Secondary | ICD-10-CM | POA: Insufficient documentation

## 2019-09-28 DIAGNOSIS — E785 Hyperlipidemia, unspecified: Secondary | ICD-10-CM | POA: Insufficient documentation

## 2019-09-28 DIAGNOSIS — I509 Heart failure, unspecified: Secondary | ICD-10-CM | POA: Diagnosis present

## 2019-09-28 DIAGNOSIS — Z823 Family history of stroke: Secondary | ICD-10-CM | POA: Insufficient documentation

## 2019-09-28 DIAGNOSIS — E039 Hypothyroidism, unspecified: Secondary | ICD-10-CM | POA: Diagnosis not present

## 2019-09-28 DIAGNOSIS — Z8673 Personal history of transient ischemic attack (TIA), and cerebral infarction without residual deficits: Secondary | ICD-10-CM | POA: Insufficient documentation

## 2019-09-28 DIAGNOSIS — Z885 Allergy status to narcotic agent status: Secondary | ICD-10-CM | POA: Diagnosis not present

## 2019-09-28 DIAGNOSIS — M1990 Unspecified osteoarthritis, unspecified site: Secondary | ICD-10-CM | POA: Insufficient documentation

## 2019-09-28 DIAGNOSIS — E1151 Type 2 diabetes mellitus with diabetic peripheral angiopathy without gangrene: Secondary | ICD-10-CM | POA: Insufficient documentation

## 2019-09-28 DIAGNOSIS — I739 Peripheral vascular disease, unspecified: Secondary | ICD-10-CM

## 2019-09-28 NOTE — Patient Instructions (Addendum)
Continue weighing daily and call for an overnight weight gain of > 2 pounds or a weekly weight gain of >5 pounds.   Call us anytime if you need to schedule another appointment.

## 2019-09-28 NOTE — Progress Notes (Signed)
Patient ID: [name], female    DOB: May 21, 1951, 69 y.o.   MRN: [id number]  HPI  Ms Rikard is a 69 y/o female with a history of CAD, DM, HTN, CKD, stroke, thyroid disease, hyperlipidemia, sleep apnea, anemia and heart failure.   Echo report from 07/17/19 reviewed and showed an EF of 30-35% along with severely dilated left atria. Echo report from 10/26/17 reviewed and showed an EF of 40% along with moderate MR and mild TR.   Admitted 07/15/19 due to peripheral vascular disease with rest pain/acute ischemic left leg/foot. Vascular consult obtained. Had mechanical thrombectomy and percutaneous transluminal angioplasty to the left anterior tibial artery, tibioperoneal trunk, peroneal artery and Viabahn stent placement to the left SFA. Discharged after 4 days. Was in the ED 06/30/19 due to acute on chronic heart failure. Given IV lasix and released.   She presents today for a follow-up visit with a chief complaint of minimal fatigue upon moderate exertion. She describes this as chronic in nature having been present for several years although she does feel like it's much improved. She has associated gradual weight gain along with this. She denies any difficulty sleeping, dizziness, abdominal distention, palpitations, pedal edema, chest pain, shortness of breath or cough.   She called her cardiologist and said that her eliquis was dropping her BP and making her feel bad and says she was told to stop it. She was supposed to call them back and let them know how she was feeling but she hadn't done so yet.   Past Medical History:  Diagnosis Date  . Anemia   . Anemia, iron deficiency 11/07/2013  . Arthritis   . CAD (coronary artery disease)   . Cerebrovascular accident ([place])   . CHF (congestive heart failure) ([place])   . Chicken pox   . Chronic kidney disease   . Colitis    ? at 56  . Diabetes mellitus   . Hashimoto thyroiditis    w subsequent hypothyroidism  . Headache   . HTN (hypertension)   .  Hyperlipidemia   . Hypothyroidism   . Myocardial infarction ([place])   . 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 Sleep apnea    Past Surgical History:  Procedure Laterality Date  . ABDOMINAL HYSTERECTOMY    . CHOLECYSTECTOMY    . COLONOSCOPY WITH PROPOFOL N/A 12/24/2014   Procedure: COLONOSCOPY WITH PROPOFOL;  Surgeon: Hulen Luster, MD;  Location: [facility] ENDOSCOPY;  Service: Gastroenterology;  Laterality: N/A;  . CORONARY ARTERY BYPASS GRAFT    . ESOPHAGOGASTRODUODENOSCOPY N/A 09/02/2017   Procedure: ESOPHAGOGASTRODUODENOSCOPY (EGD);  Surgeon: Virgel Manifold, MD;  Location: [facility] ENDOSCOPY;  Service: Endoscopy;  Laterality: N/A;  . ESOPHAGOGASTRODUODENOSCOPY (EGD) WITH PROPOFOL N/A 12/24/2014   Procedure: ESOPHAGOGASTRODUODENOSCOPY (EGD) WITH PROPOFOL;  Surgeon: Hulen Luster, MD;  Location: [facility] ENDOSCOPY;  Service: Gastroenterology;  Laterality: N/A;  . JOINT REPLACEMENT    . LOWER EXTREMITY ANGIOGRAPHY Left 07/17/2019   Procedure: Lower Extremity Angiography;  Surgeon: Algernon Huxley, MD;  Location: [facility] LAB;  Service: Cardiovascular;  Laterality: Left;  . ROUX-EN-Y GASTRIC BYPASS     loss over 100 pounds  . TONSILLECTOMY    . TOTAL KNEE ARTHROPLASTY Left    Family History  Problem Relation Age of Onset  . Coronary artery disease Other   . Stroke Maternal Grandmother   . Diabetes Paternal Grandmother        diabetic coma  . Heart attack Father   .  Heart attack Mother   . Heart attack Maternal Grandfather   . Heart attack Paternal Grandfather   . Breast cancer Neg Hx    Social History   Tobacco Use  . Smoking status: Never Smoker  . Smokeless tobacco: Never Used  Substance Use Topics  . Alcohol use: No   Allergies  Allergen Reactions  . Niacin Other (See Comments)  . Ultram [Tramadol Hcl]     Intolerance, makes woozy  . Baclofen Other (See Comments)    Dizziness  . Cardura [Doxazosin Mesylate] Other (See Comments)    Dizzy with  passing out spells  . Morphine And Related Nausea And Vomiting  . Other Rash    Gold metal (nickel in the gold)   Prior to Admission medications   Medication Sig Start Date End Date Taking? Authorizing Provider  aspirin EC 81 MG tablet Take 81 mg by mouth daily.    Yes [provider]  benazepril (LOTENSIN) 40 MG tablet TAKE ONE TABLET BY MOUTH ONCE DAILY Patient taking differently: Take 40 mg by mouth daily.  08/24/14  Yes Susy Frizzle, MD  cloNIDine (CATAPRES) 0.1 MG tablet Take 0.1 mg by mouth 2 (two) times daily.   Yes [provider]  Ferrous Sulfate (SLOW FE) 142 (45 Fe) MG TBCR Take 1 tablet by mouth 2 (two) times a week.   Yes [provider]  furosemide (LASIX) 40 MG tablet Take 1 tablet (40 mg total) by mouth daily. 07/03/19  Yes Shontia Gillooly A, FNP  glimepiride (AMARYL) 1 MG tablet Take 1 tablet by mouth daily. 06/15/17  Yes [provider]  hydrALAZINE (APRESOLINE) 25 MG tablet Take 1 tablet (25 mg total) by mouth 2 (two) times daily. 07/19/19  Yes Fritzi Mandes, MD  levothyroxine (SYNTHROID) 137 MCG tablet Take 137 mcg by mouth daily before breakfast.   Yes [provider]  lovastatin (MEVACOR) 20 MG tablet Take 2 tablets (40 mg total) by mouth at bedtime. 02/06/14  Yes Susy Frizzle, MD  pantoprazole (PROTONIX) 40 MG tablet Take 1 tablet (40 mg total) by mouth daily. 07/20/19  Yes Fritzi Mandes, MD  potassium chloride (KLOR-CON) 10 MEQ tablet Take 2 tablets (20 mEq total) by mouth daily. 07/03/19  Yes Alisa Graff, FNP  traMADol (ULTRAM) 50 MG tablet Take 1 tablet (50 mg total) by mouth every 12 (twelve) hours as needed for severe pain. 07/19/19 07/18/20 Yes Fritzi Mandes, MD  vitamin B-12 (CYANOCOBALAMIN) 1000 MCG tablet Take 1,000 mcg by mouth daily.   Yes [provider]     Review of Systems  Constitutional: Positive for fatigue. Negative for appetite change.  HENT: Negative for congestion, postnasal drip and sore throat.    Eyes: Negative.   Respiratory: Negative for cough and shortness of breath.   Cardiovascular: Negative for chest pain, palpitations and leg swelling.  Gastrointestinal: Negative for abdominal distention, abdominal pain and nausea.  Endocrine: Negative.   Genitourinary: Negative.   Musculoskeletal: Negative for back pain and neck pain.  Skin: Negative.   Allergic/Immunologic: Negative.   Neurological: Negative for dizziness, weakness and light-headedness.  Hematological: Negative for adenopathy. Does not bruise/bleed easily.  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 The patient is not nervous/anxious.    Vitals:   09/28/19 1311  BP: 134/71  Pulse: 71  Resp: 18  SpO2: 100%  Weight: 208 lb 2 oz (94.4 kg)  Height: 5\' 6"  (1.676 m)  Wt Readings from Last 3 Encounters:  09/28/19 208 lb 2 oz (94.4 kg)  08/01/19 201 lb 12.8 oz (91.5 kg)  07/19/19 198 lb 12.8 oz (90.2 kg)   Lab Results  Component Value Date   CREATININE 1.87 (H) 07/19/2019   CREATININE 1.87 (H) 07/18/2019   CREATININE 1.63 (H) 07/17/2019     Physical Exam Vitals and nursing note reviewed.  Constitutional:      Appearance: She is well-developed.  HENT:     Head: Normocephalic and atraumatic.  Eyes:     Extraocular Movements: Extraocular movements intact.     Pupils: Pupils are equal, round, and reactive to light.  Neck:     Vascular: No JVD.  Cardiovascular:     Rate and Rhythm: Normal rate and regular rhythm.  Pulmonary:     Effort: Pulmonary effort is normal. No respiratory distress.     Breath sounds: No wheezing, rhonchi or rales.  Abdominal:     Palpations: Abdomen is soft.     Tenderness: There is no abdominal tenderness.  Musculoskeletal:     Cervical back: Normal range of motion and neck supple.     Right lower leg: No tenderness. No edema.     Left lower leg: No tenderness. No edema.  Skin:     General: Skin is warm and dry.  Neurological:     General: No focal deficit present.     Mental Status: She is alert and oriented to person, place, and time.  Psychiatric:        Mood and Affect: Mood normal.        Behavior: Behavior normal.     Assessment & Plan:  1: Chronic heart failure with reduced ejection fraction- - NYHA class II - euvolemic today - weighing daily; reminded to call for an overnight weight gain of >2 pounds or a weekly weight gain of >5 pounds - weight up 6 pounds from last visit here 2 months ago - not adding salt and tries to eat low sodium and has been diligent about reading food labels - has been bradycardic in the past so currently not on a beta-blocker - saw cardiology Nehemiah Massed) 08/03/19 - says that she's now gardening and doing a lot more walking every day and feels "great" - BNP 06/30/19 was 3314.0 - has received both her COVID vaccines  2: HTN- - BP looks good today - saw PCP Ouida Sills) 07/12/19 - BMP 07/19/19 reviewed and showed sodium 139, potassium 4.5, creatinine 1.87 and GFR 27  3: PVD- - s/p thrombectomy to left popliteal artery on 07/17/19 - has been off eliquis because she said it dropped her BP and made her feel bad; encouraged her to call cardiologist office back for further instruction    Patient did not bring her medications nor a list. Each medication was verbally reviewed with the patient and she was encouraged to bring the bottles to every visit to confirm accuracy of list.  Due to HF stability, will not schedule another appointment at this time. Advised patient that she could return at anytime to schedule another appointment and patient was comfortable with this plan.

## 2019-09-29 ENCOUNTER — Other Ambulatory Visit: Payer: Self-pay

## 2019-09-29 ENCOUNTER — Ambulatory Visit (INDEPENDENT_AMBULATORY_CARE_PROVIDER_SITE_OTHER): Payer: Medicare Other

## 2019-09-29 ENCOUNTER — Encounter (INDEPENDENT_AMBULATORY_CARE_PROVIDER_SITE_OTHER): Payer: Self-pay | Admitting: Nurse Practitioner

## 2019-09-29 ENCOUNTER — Ambulatory Visit (INDEPENDENT_AMBULATORY_CARE_PROVIDER_SITE_OTHER): Payer: Medicare Other | Admitting: Nurse Practitioner

## 2019-09-29 VITALS — BP 136/68 | HR 67 | Resp 16 | Wt 206.0 lb

## 2019-09-29 DIAGNOSIS — E782 Mixed hyperlipidemia: Secondary | ICD-10-CM

## 2019-09-29 DIAGNOSIS — E1151 Type 2 diabetes mellitus with diabetic peripheral angiopathy without gangrene: Secondary | ICD-10-CM | POA: Diagnosis not present

## 2019-09-29 DIAGNOSIS — Z95820 Peripheral vascular angioplasty status with implants and grafts: Secondary | ICD-10-CM | POA: Diagnosis not present

## 2019-09-29 DIAGNOSIS — I739 Peripheral vascular disease, unspecified: Secondary | ICD-10-CM

## 2019-09-29 MED ORDER — CLOPIDOGREL BISULFATE 75 MG PO TABS: 75.0000 mg | ORAL_TABLET | Freq: Every day | ORAL | 6 refills | Status: DC

## 2019-10-04 ENCOUNTER — Encounter (INDEPENDENT_AMBULATORY_CARE_PROVIDER_SITE_OTHER): Payer: Self-pay | Admitting: Nurse Practitioner

## 2019-10-04 NOTE — Progress Notes (Signed)
Subjective:    Patient ID: [name], female    DOB: 10/17/1950, 69 y.o.   MRN: [id number] Chief Complaint  Patient presents with  . Follow-up    [facility] 1 month follow up    The patient returns to the office for followup and review status post angiogram with intervention.  The patient underwent left lower extremity angiogram after having an acutely ischemic leg.  The patient notes improvement in the lower extremity symptoms. No interval shortening of the patient's claudication distance or rest pain symptoms. Previous wounds have now healed.  No new ulcers or wounds have occurred since the last visit.  Patient also does note that she stopped the Eliquis that she was placed on at discharge due to not tolerating it.  There have been no significant changes to the patient's overall health care.  The patient denies amaurosis fugax or recent TIA symptoms. There are no recent neurological changes noted. The patient denies history of DVT, PE or superficial thrombophlebitis. The patient denies recent episodes of angina or shortness of breath.   ABI's Rt=1.58 and Lt=0.75  (no previous) Duplex US of the right lower extremity reveals triphasic waveforms in the tibial arteries whereas the left lower extremity has strong monophasic waveforms with dampened left great toe digit waveforms.  The right great toe digit waveforms are strong.   Review of Systems  All other systems reviewed and are negative.      Objective:   Physical Exam Vitals reviewed.  Constitutional:      Appearance: Normal appearance.  Cardiovascular:     Rate and Rhythm: Normal rate and regular rhythm.     Pulses:          Dorsalis pedis pulses are 2+ on the right side and 2+ on the left side.       Posterior tibial pulses are 2+ on the right side and 1+ on the left side.  Pulmonary:     Effort: Pulmonary effort is normal.     Breath sounds: Normal breath sounds.  Skin:    Capillary Refill: Capillary refill takes more  than 3 seconds.  Neurological:     Mental Status: She is alert and oriented to person, place, and time.  Psychiatric:        Mood and Affect: Mood normal.        Behavior: Behavior normal.        Thought Content: Thought content normal.        Judgment: Judgment normal.     BP 136/68 (BP Location: Right Arm)   Pulse 67   Resp 16   Wt 206 lb (93.4 kg)   BMI 33.25 kg/m   Past Medical History:  Diagnosis Date  . Anemia   . Anemia, iron deficiency 11/07/2013  . Arthritis   . CAD (coronary artery disease)   . Cerebrovascular accident ([place])   . CHF (congestive heart failure) ([place])   . Chicken pox   . Chronic kidney disease   . Colitis    ? at 25  . Diabetes mellitus   . Hashimoto thyroiditis    w subsequent hypothyroidism  . Headache   . HTN (hypertension)   . Hyperlipidemia   . Hypothyroidism   . Myocardial infarction ([place])   . 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 Sleep apnea     Social History   Socioeconomic History  . Marital status: Single    Spouse name: Not on file  . Number  of children: 0  . Years of education: Not on file  . Highest education level: Not on file  Occupational History  . Occupation: customer service    Employer: [employer]  Tobacco Use  . Smoking status: Never Smoker  . Smokeless tobacco: Never Used  Substance and Sexual Activity  . Alcohol use: No  . Drug use: No  . Sexual activity: Not Currently  Other Topics Concern  . Not on file  Social History Narrative   Single, no children   Customer service   1 caffeine/day   Social Determinants of Health   Financial Resource Strain:   . Difficulty of Paying Living Expenses:   Food Insecurity:   . Worried About Charity fundraiser in the Last Year:   . Arboriculturist in the Last Year:   Transportation Needs:   . Film/video editor (Medical):   Marland Kitchen Lack of Transportation (Non-Medical):   Physical Activity:   . Days of Exercise per Week:   . Minutes  of Exercise per Session:   Stress:   . Feeling of Stress :   Social Connections:   . Frequency of Communication with Friends and Family:   . Frequency of Social Gatherings with Friends and Family:   . Attends Religious Services:   . Active Member of Clubs or Organizations:   . Attends Archivist Meetings:   Marland Kitchen Marital Status:   Intimate Partner Violence:   . Fear of Current or Ex-Partner:   . Emotionally Abused:   Marland Kitchen Physically Abused:   . Sexually Abused:     Past Surgical History:  Procedure Laterality Date  . ABDOMINAL HYSTERECTOMY    . CHOLECYSTECTOMY    . COLONOSCOPY WITH PROPOFOL N/A 12/24/2014   Procedure: COLONOSCOPY WITH PROPOFOL;  Surgeon: Hulen Luster, MD;  Location: [facility] ENDOSCOPY;  Service: Gastroenterology;  Laterality: N/A;  . CORONARY ARTERY BYPASS GRAFT    . ESOPHAGOGASTRODUODENOSCOPY N/A 09/02/2017   Procedure: ESOPHAGOGASTRODUODENOSCOPY (EGD);  Surgeon: Virgel Manifold, MD;  Location: [facility] ENDOSCOPY;  Service: Endoscopy;  Laterality: N/A;  . ESOPHAGOGASTRODUODENOSCOPY (EGD) WITH PROPOFOL N/A 12/24/2014   Procedure: ESOPHAGOGASTRODUODENOSCOPY (EGD) WITH PROPOFOL;  Surgeon: Hulen Luster, MD;  Location: [facility] ENDOSCOPY;  Service: Gastroenterology;  Laterality: N/A;  . JOINT REPLACEMENT    . LOWER EXTREMITY ANGIOGRAPHY Left 07/17/2019   Procedure: Lower Extremity Angiography;  Surgeon: Algernon Huxley, MD;  Location: [place] [facility] LAB;  Service: Cardiovascular;  Laterality: Left;  . ROUX-EN-Y GASTRIC BYPASS     loss over 100 pounds  . TONSILLECTOMY    . TOTAL KNEE ARTHROPLASTY Left     Family History  Problem Relation Age of Onset  . Coronary artery disease Other   . Stroke Maternal Grandmother   . Diabetes Paternal Grandmother        diabetic coma  . Heart attack Father   . Heart attack Mother   . Heart attack Maternal Grandfather   . Heart attack Paternal Grandfather   . Breast cancer Neg Hx     Allergies  Allergen Reactions  . Niacin Other (See  Comments)  . Ultram [Tramadol Hcl]     Intolerance, makes woozy  . Baclofen Other (See Comments)    Dizziness  . Cardura [Doxazosin Mesylate] Other (See Comments)    Dizzy with passing out spells  . Morphine And Related Nausea And Vomiting  . Other Rash    Gold metal (nickel in the gold)       Assessment &  Plan:   1. PAD (peripheral artery disease) (HCC) Recommend:  The patient is status post successful angiogram with intervention.  The patient reports that the claudication symptoms and leg pain is essentially gone.   The patient denies lifestyle limiting changes at this point in time.  No further invasive studies, angiography or surgery at this time The patient should continue walking and begin a more formal exercise program.  We will have the patient switch to Plavix and aspirin versus Eliquis.  Discussed with the patient difference between the 2 medications and that Plavix was generally not as effective at preventing recurrence of thrombus as Eliquis.  Patient did not want to try Xarelto for fear that it would have the same effects as Eliquis.  Patient also did not want to be placed on Coumadin.  The patient should continue wearing graduated compression socks 10-15 mmHg strength to control the mild edema.  Patient should undergo noninvasive studies as ordered. The patient will follow up with me after the studies.   - clopidogrel (PLAVIX) 75 MG tablet; Take 1 tablet (75 mg total) by mouth daily.  Dispense: 30 tablet; Refill: 6  2. Hyperlipidemia, mixed Continue statin as ordered and reviewed, no changes at this time   3. Type 2 diabetes mellitus with diabetic peripheral angiopathy without gangrene, without long-term current use of insulin (HCC) Continue hypoglycemic medications as already ordered, these medications have been reviewed and there are no changes at this time.  Hgb A1C to be monitored as already arranged by primary service    Current Outpatient Medications on  File Prior to Visit  Medication Sig Dispense Refill  . aspirin EC 81 MG tablet Take 81 mg by mouth daily.     . benazepril (LOTENSIN) 40 MG tablet TAKE ONE TABLET BY MOUTH ONCE DAILY (Patient taking differently: Take 40 mg by mouth daily. ) 30 tablet 0  . cloNIDine (CATAPRES) 0.1 MG tablet Take 0.1 mg by mouth 2 (two) times daily.    . Ferrous Sulfate (SLOW FE) 142 (45 Fe) MG TBCR Take 1 tablet by mouth 2 (two) times a week.    . furosemide (LASIX) 40 MG tablet Take 1 tablet (40 mg total) by mouth daily. 30 tablet 5  . glimepiride (AMARYL) 1 MG tablet Take 1 tablet by mouth daily.    . hydrALAZINE (APRESOLINE) 25 MG tablet Take 1 tablet (25 mg total) by mouth 2 (two) times daily. 30 tablet 0  . levothyroxine (SYNTHROID) 137 MCG tablet Take 137 mcg by mouth daily before breakfast.    . lovastatin (MEVACOR) 20 MG tablet Take 2 tablets (40 mg total) by mouth at bedtime. 180 tablet 3  . pantoprazole (PROTONIX) 40 MG tablet Take 1 tablet (40 mg total) by mouth daily. 40 tablet 0  . potassium chloride (KLOR-CON) 10 MEQ tablet Take 2 tablets (20 mEq total) by mouth daily. 60 tablet 5  . traMADol (ULTRAM) 50 MG tablet Take 1 tablet (50 mg total) by mouth every 12 (twelve) hours as needed for severe pain. 20 tablet 0  . vitamin B-12 (CYANOCOBALAMIN) 1000 MCG tablet Take 1,000 mcg by mouth daily.     No current facility-administered medications on file prior to visit.    There are no Patient Instructions on file for this visit. No follow-ups on file.   Kris Hartmann, NP

## 2019-12-26 ENCOUNTER — Ambulatory Visit
Admission: EM | Admit: 2019-12-26 | Discharge: 2019-12-26 | Disposition: A | Discharge status: Home / Self Care | Payer: Medicare Other | Attending: Physician Assistant | Admitting: Physician Assistant

## 2019-12-26 DIAGNOSIS — J209 Acute bronchitis, unspecified: Secondary | ICD-10-CM

## 2019-12-26 MED ORDER — CLOTRIMAZOLE-BETAMETHASONE 1-0.05 % EX CREA: TOPICAL_CREAM | CUTANEOUS | 0 refills | Status: DC

## 2019-12-26 MED ORDER — DOXYCYCLINE HYCLATE 100 MG PO CAPS: 100.0000 mg | ORAL_CAPSULE | Freq: Two times a day (BID) | ORAL | 0 refills | Status: DC

## 2019-12-26 MED ORDER — FLUCONAZOLE 150 MG PO TABS: 150.0000 mg | ORAL_TABLET | Freq: Every day | ORAL | 0 refills | Status: DC

## 2019-12-26 MED ORDER — BENZONATATE 200 MG PO CAPS: 200.0000 mg | ORAL_CAPSULE | Freq: Three times a day (TID) | ORAL | 0 refills | Status: DC

## 2019-12-26 NOTE — ED Provider Notes (Signed)
[facility]    CSN: [id number] Arrival date & time: 12/26/19  1900      History   Chief Complaint Chief Complaint  Patient presents with  . Cough  . Shortness of Breath    HPI [name] is a 69 y.o. female.   69 year old female with history of CAD, CHF, HTN, HLD, DM, comes in for 8 day of URI symptoms. Cough, rhinorrhea. Cough is mostly nonproductive, but deep. Coughing fits causing shortness of breath. Denies shortness of breath at rest. Denies chest pain, orthopnea, leg swelling. Daily weights within 5 lb without significant changes. otc medicines without relief. Never smoker, passive smoker growing up for 20 years     Past Medical History:  Diagnosis Date  . Anemia   . Anemia, iron deficiency 11/07/2013  . Arthritis   . CAD (coronary artery disease)   . Cerebrovascular accident ([place])   . CHF (congestive heart failure) ([place])   . Chicken pox   . Chronic kidney disease   . Colitis    ? at 58  . Diabetes mellitus   . Hashimoto thyroiditis    w subsequent hypothyroidism  . Headache   . HTN (hypertension)   . Hyperlipidemia   . Hypothyroidism   . Myocardial infarction ([place])   . 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 Sleep apnea     Patient Active Problem List   Diagnosis Date Noted  . Acute on chronic systolic CHF (congestive heart failure), NYHA class 3 ([place]) 08/03/2019  . Type 2 diabetes mellitus with diabetic peripheral angiopathy without gangrene, without long-term current use of insulin ([place])   . Ischemic pain of left foot   . PAD (peripheral artery disease) ([place])   . Ischemic foot 07/15/2019  . H/O: stroke 07/15/2019  . Healthcare maintenance 11/30/2018  . Amaurosis fugax of right eye 12/14/2017  . Bilateral leg edema 09/28/2017  . Hyperlipidemia, mixed 09/28/2017  . Lesion of esophagus   . Acute gastric ulcer without hemorrhage or perforation   . Anemia 09/01/2017  . DOE (dyspnea on exertion)  09/01/2017  . Chronic insomnia 01/26/2017  . Postmenopausal 08/05/2016  . Low TSH level 01/12/2016  . Coronary artery disease with hx of myocardial infarct w/o hx of CABG 05/21/2015  . Chronic kidney disease, stage III (moderate) 10/16/2014  . Acquired hypothyroidism 08/06/2014  . Type 2 diabetes mellitus without complication ([place]) 06/10/7251  . Anemia, iron deficiency 11/07/2013  . 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 Heme + stool 11/07/2013  . Feces contents abnormal 11/07/2013  . HYPERTENSION, BENIGN 11/10/2008  . CAD, ARTERY BYPASS GRAFT 11/10/2008    Past Surgical History:  Procedure Laterality Date  . ABDOMINAL HYSTERECTOMY    . CHOLECYSTECTOMY    . COLONOSCOPY WITH PROPOFOL N/A 12/24/2014   Procedure: COLONOSCOPY WITH PROPOFOL;  Surgeon: Hulen Luster, MD;  Location: [facility] ENDOSCOPY;  Service: Gastroenterology;  Laterality: N/A;  . CORONARY ARTERY BYPASS GRAFT    . ESOPHAGOGASTRODUODENOSCOPY N/A 09/02/2017   Procedure: ESOPHAGOGASTRODUODENOSCOPY (EGD);  Surgeon: Virgel Manifold, MD;  Location: [facility] ENDOSCOPY;  Service: Endoscopy;  Laterality: N/A;  . ESOPHAGOGASTRODUODENOSCOPY (EGD) WITH PROPOFOL N/A 12/24/2014   Procedure: ESOPHAGOGASTRODUODENOSCOPY (EGD) WITH PROPOFOL;  Surgeon: Hulen Luster, MD;  Location: [facility] ENDOSCOPY;  Service: Gastroenterology;  Laterality: N/A;  . JOINT REPLACEMENT    . LOWER EXTREMITY ANGIOGRAPHY Left 07/17/2019   Procedure: Lower Extremity Angiography;  Surgeon: Algernon Huxley, MD;  Location: [facility]  INVASIVE CV LAB;  Service: Cardiovascular;  Laterality: Left;  . ROUX-EN-Y GASTRIC BYPASS     loss over 100 pounds  . TONSILLECTOMY    . TOTAL KNEE ARTHROPLASTY Left     OB History   No obstetric history on file.      Home Medications    Prior to Admission medications   Medication Sig Start Date End Date Taking? Authorizing Provider  aspirin EC 81 MG tablet Take 81 mg by mouth daily.     [provider]    benazepril (LOTENSIN) 40 MG tablet TAKE ONE TABLET BY MOUTH ONCE DAILY Patient taking differently: Take 40 mg by mouth daily.  08/24/14   Susy Frizzle, MD  benzonatate (TESSALON) 200 MG capsule Take 1 capsule (200 mg total) by mouth every 8 (eight) hours. 12/26/19   Tasia Catchings, Alizaya Oshea V, PA-C  cloNIDine (CATAPRES) 0.1 MG tablet Take 0.1 mg by mouth 2 (two) times daily.    [provider]  clopidogrel (PLAVIX) 75 MG tablet Take 1 tablet (75 mg total) by mouth daily. 09/29/19   Kris Hartmann, NP  clotrimazole-betamethasone (LOTRISONE) cream Apply to affected area 2 times daily prn 12/26/19   Tasia Catchings, Rainy Rothman V, PA-C  doxycycline (VIBRAMYCIN) 100 MG capsule Take 1 capsule (100 mg total) by mouth 2 (two) times daily. 12/26/19   Tasia Catchings, Lynix Bonine V, PA-C  Ferrous Sulfate (SLOW FE) 142 (45 Fe) MG TBCR Take 1 tablet by mouth 2 (two) times a week.    [provider]  fluconazole (DIFLUCAN) 150 MG tablet Take 1 tablet (150 mg total) by mouth daily. Take second dose 72 hours later if symptoms still persists. 12/26/19   Tasia Catchings, Bearl Talarico V, PA-C  furosemide (LASIX) 40 MG tablet Take 1 tablet (40 mg total) by mouth daily. 07/03/19   Alisa Graff, FNP  glimepiride (AMARYL) 1 MG tablet Take 1 tablet by mouth daily. 06/15/17   [provider]  hydrALAZINE (APRESOLINE) 25 MG tablet Take 1 tablet (25 mg total) by mouth 2 (two) times daily. 07/19/19   Fritzi Mandes, MD  levothyroxine (SYNTHROID) 137 MCG tablet Take 137 mcg by mouth daily before breakfast.    [provider]  lovastatin (MEVACOR) 20 MG tablet Take 2 tablets (40 mg total) by mouth at bedtime. 02/06/14   Susy Frizzle, MD  pantoprazole (PROTONIX) 40 MG tablet Take 1 tablet (40 mg total) by mouth daily. 07/20/19   Fritzi Mandes, MD  potassium chloride (KLOR-CON) 10 MEQ tablet Take 2 tablets (20 mEq total) by mouth daily. 07/03/19   Alisa Graff, FNP  traMADol (ULTRAM) 50 MG tablet Take 1 tablet (50 mg total) by mouth every 12 (twelve) hours as needed for  severe pain. 07/19/19 07/18/20  Fritzi Mandes, MD  vitamin B-12 (CYANOCOBALAMIN) 1000 MCG tablet Take 1,000 mcg by mouth daily.    [provider]    Family History Family History  Problem Relation Age of Onset  . Coronary artery disease Other   . Stroke Maternal Grandmother   . Diabetes Paternal Grandmother        diabetic coma  . Heart attack Father   . Heart attack Mother   . Heart attack Maternal Grandfather   . Heart attack Paternal Grandfather   . Breast cancer Neg Hx     Social History Social History   Tobacco Use  . Smoking status: Never Smoker  . Smokeless tobacco: Never Used  Vaping Use  . Vaping Use: Never used  Substance Use  Topics  . Alcohol use: No  . Drug use: No     Allergies   Niacin, Ultram [tramadol hcl], Baclofen, Cardura [doxazosin mesylate], Morphine and related, and Other   Review of Systems Review of Systems  Reason unable to perform ROS: See HPI as above.     Physical Exam Triage Vital Signs ED Triage Vitals  Enc Vitals Group     BP 12/26/19 2013 131/75     Pulse Rate 12/26/19 2013 80     Resp 12/26/19 2013 18     Temp 12/26/19 2013 98.8 F (37.1 C)     Temp Source 12/26/19 2013 Oral     SpO2 12/26/19 2013 95 %     Weight --      Height --      Head Circumference --      Peak Flow --      Pain Score 12/26/19 2014 9     Pain Loc --      Pain Edu? --      Excl. in [place]? --    No data found.  Updated Vital Signs BP 131/75 (BP Location: Left Arm)   Pulse 80   Temp 98.8 F (37.1 C) (Oral)   Resp 18   SpO2 95%    Physical Exam Constitutional:      General: She is not in acute distress.    Appearance: Normal appearance. She is well-developed. She is not toxic-appearing or diaphoretic.  HENT:     Head: Normocephalic and atraumatic.  Eyes:     Conjunctiva/sclera: Conjunctivae normal.     Pupils: Pupils are equal, round, and reactive to light.  Cardiovascular:     Rate and Rhythm: Normal rate and regular rhythm.    Pulmonary:     Effort: Pulmonary effort is normal. No respiratory distress.     Comments: Bronchitic cough.  Diffuse rhonchi with expiratory wheezing to the lower periphery.  No rales.  Good air movement. Musculoskeletal:     Cervical back: Normal range of motion and neck supple.     Right lower leg: No tenderness. No edema.     Left lower leg: No tenderness. No edema.  Skin:    General: Skin is warm and dry.  Neurological:     Mental Status: She is alert and oriented to person, place, and time.      UC Treatments / Results  Labs (all labs ordered are listed, but only abnormal results are displayed) Labs Reviewed - No data to display  EKG   Radiology No results found.  Procedures Procedures (including critical care time)  Medications Ordered in UC Medications - No data to display  Initial Impression / Assessment and Plan / UC Course  I have reviewed the triage vital signs and the nursing notes.  Pertinent labs & imaging results that were available during my care of the patient were reviewed by me and considered in my medical decision making (see chart for details).    No leg swelling, orthopnea, chest pain, daily weights withint 4-5lbs, low suspicion of CHF exacerbation. Doxycycline for bronchitis. Patient with history of DM, last a1c 7, would like to avoid prednisone. Has used albuterol in the past, but does not like breathing in medicines. Allergies/intolerance with tramadol/morphine. Will start tessalon for now for cough. If not well controlled, may need to retry albuterol or start prednisone. Return precautions given. Patient expresses understanding and agrees to plan.  Final Clinical Impressions(s) / UC Diagnoses   Final diagnoses:  Acute bronchitis, unspecified organism    ED Prescriptions    Medication Sig Dispense Auth. Provider   doxycycline (VIBRAMYCIN) 100 MG capsule Take 1 capsule (100 mg total) by mouth 2 (two) times daily. 14 capsule [place] Maulden V, PA-C    benzonatate (TESSALON) 200 MG capsule Take 1 capsule (200 mg total) by mouth every 8 (eight) hours. 21 capsule Kian Gamarra V, PA-C   clotrimazole-betamethasone (LOTRISONE) cream Apply to affected area 2 times daily prn 15 g Pamla Pangle V, PA-C   fluconazole (DIFLUCAN) 150 MG tablet Take 1 tablet (150 mg total) by mouth daily. Take second dose 72 hours later if symptoms still persists. 2 tablet Ok Edwards, PA-C     PDMP not reviewed this encounter.   Ok Edwards, PA-C 12/26/19 2215

## 2019-12-26 NOTE — ED Triage Notes (Signed)
Patient is here today with complaints of shortness of breath, dry cough, runny nose - clear for the past 8 days. Patient states she tried OTC Diabetic Robitussin and Benadryl with no relief.   Patient denies fever, sorethroat

## 2019-12-26 NOTE — Discharge Instructions (Signed)
Start doxycycline as directed. Tessalon for cough. Tylenol/motrin for fever and pain. Monitor for any worsening of symptoms, chest pain, shortness of breath, wheezing, swelling of the throat, go to the emergency department for further evaluation needed.   Diflucan to prevent yeast infection. Lotrisone for under your breast.

## 2019-12-29 ENCOUNTER — Encounter (INDEPENDENT_AMBULATORY_CARE_PROVIDER_SITE_OTHER): Payer: Medicare Other

## 2019-12-29 ENCOUNTER — Ambulatory Visit (INDEPENDENT_AMBULATORY_CARE_PROVIDER_SITE_OTHER): Payer: Medicare Other | Admitting: Nurse Practitioner

## 2019-12-29 ENCOUNTER — Other Ambulatory Visit (INDEPENDENT_AMBULATORY_CARE_PROVIDER_SITE_OTHER): Payer: Self-pay | Admitting: Nurse Practitioner

## 2019-12-29 DIAGNOSIS — I739 Peripheral vascular disease, unspecified: Secondary | ICD-10-CM

## 2020-01-07 ENCOUNTER — Other Ambulatory Visit: Payer: Self-pay | Admitting: Family

## 2020-01-29 ENCOUNTER — Other Ambulatory Visit: Payer: Self-pay | Admitting: Family

## 2020-02-28 ENCOUNTER — Other Ambulatory Visit: Payer: Self-pay | Admitting: Family

## 2020-02-28 MED ORDER — FUROSEMIDE 40 MG PO TABS: 40.0000 mg | ORAL_TABLET | Freq: Every day | ORAL | 5 refills | Status: DC

## 2020-03-07 NOTE — Progress Notes (Signed)
Patient ID: [name], female    DOB: [birth year], 69 y.o.   MRN: [id number]  HPI  Ms Kohut is a 69 y/o female with a history of CAD, DM, HTN, CKD, stroke, thyroid disease, hyperlipidemia, sleep apnea, anemia and heart failure.   Echo report from 07/17/19 reviewed and showed an EF of 30-35% along with severely dilated left atria. Echo report from 10/26/17 reviewed and showed an EF of 40% along with moderate MR and mild TR.   Was in the ED 12/26/19 due to cough and shortness of breath. Tessalon perles and antibiotics started due to bronchitis and she was released.  She presents today for a follow-up visit with a chief complaint of minimal shortness of breath upon moderate exertion. She describes this as chronic in nature having been present for several years. She has associated fatigue and chronic difficulty sleeping along with this. She denies any dizziness, abdominal distention, palpitations, pedal edema, chest pain, cough or weight gain.   Continues to be quite active at home.   Past Medical History:  Diagnosis Date  . Anemia   . Anemia, iron deficiency 11/07/2013  . Arthritis   . CAD (coronary artery disease)   . Cerebrovascular accident ([place])   . CHF (congestive heart failure) ([place])   . Chicken pox   . Chronic kidney disease   . Colitis    ? at 26  . Diabetes mellitus   . Hashimoto thyroiditis    w subsequent hypothyroidism  . Headache   . HTN (hypertension)   . Hyperlipidemia   . Hypothyroidism   . Myocardial infarction ([place])   . 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 Sleep apnea    Past Surgical History:  Procedure Laterality Date  . ABDOMINAL HYSTERECTOMY    . CHOLECYSTECTOMY    . COLONOSCOPY WITH PROPOFOL N/A 12/24/2014   Procedure: COLONOSCOPY WITH PROPOFOL;  Surgeon: Hulen Luster, MD;  Location: [facility] ENDOSCOPY;  Service: Gastroenterology;  Laterality: N/A;  . CORONARY ARTERY BYPASS GRAFT    . ESOPHAGOGASTRODUODENOSCOPY N/A 09/02/2017    Procedure: ESOPHAGOGASTRODUODENOSCOPY (EGD);  Surgeon: Virgel Manifold, MD;  Location: [facility] ENDOSCOPY;  Service: Endoscopy;  Laterality: N/A;  . ESOPHAGOGASTRODUODENOSCOPY (EGD) WITH PROPOFOL N/A 12/24/2014   Procedure: ESOPHAGOGASTRODUODENOSCOPY (EGD) WITH PROPOFOL;  Surgeon: Hulen Luster, MD;  Location: [facility] ENDOSCOPY;  Service: Gastroenterology;  Laterality: N/A;  . JOINT REPLACEMENT    . LOWER EXTREMITY ANGIOGRAPHY Left 07/17/2019   Procedure: Lower Extremity Angiography;  Surgeon: Algernon Huxley, MD;  Location: [place] CV LAB;  Service: Cardiovascular;  Laterality: Left;  . ROUX-EN-Y GASTRIC BYPASS     loss over 100 pounds  . TONSILLECTOMY    . TOTAL KNEE ARTHROPLASTY Left    Family History  Problem Relation Age of Onset  . Coronary artery disease Other   . Stroke Maternal Grandmother   . Diabetes Paternal Grandmother        diabetic coma  . Heart attack Father   . Heart attack Mother   . Heart attack Maternal Grandfather   . Heart attack Paternal Grandfather   . Breast cancer Neg Hx    Social History   Tobacco Use  . Smoking status: Never Smoker  . Smokeless tobacco: Never Used  Substance Use Topics  . Alcohol use: No   Allergies  Allergen Reactions  . Niacin Other (See Comments)  . Ultram [Tramadol Hcl]     Intolerance, makes woozy  . Baclofen Other (See Comments)  Dizziness  . Cardura [Doxazosin Mesylate] Other (See Comments)    Dizzy with passing out spells  . Morphine And Related Nausea And Vomiting  . Other Rash    Gold metal (nickel in the gold)   Prior to Admission medications   Medication Sig Start Date End Date Taking? Authorizing Provider  aspirin EC 81 MG tablet Take 81 mg by mouth daily.    Yes [provider]  benazepril (LOTENSIN) 40 MG tablet TAKE ONE TABLET BY MOUTH ONCE DAILY Patient taking differently: Take 40 mg by mouth daily.  08/24/14  Yes Susy Frizzle, MD  benzonatate (TESSALON) 200 MG capsule Take 1 capsule (200 mg  total) by mouth every 8 (eight) hours. 12/26/19  Yes Yu, Amy V, PA-C  cloNIDine (CATAPRES) 0.1 MG tablet Take 0.1 mg by mouth 2 (two) times daily.   Yes [provider]  clopidogrel (PLAVIX) 75 MG tablet Take 1 tablet (75 mg total) by mouth daily. 09/29/19  Yes Kris Hartmann, NP  clotrimazole-betamethasone (LOTRISONE) cream Apply to affected area 2 times daily prn 12/26/19  Yes Yu, Amy V, PA-C  doxycycline (VIBRAMYCIN) 100 MG capsule Take 1 capsule (100 mg total) by mouth 2 (two) times daily. 12/26/19  Yes Yu, Amy V, PA-C  Ferrous Sulfate (SLOW FE) 142 (45 Fe) MG TBCR Take 1 tablet by mouth 2 (two) times a week.   Yes [provider]  fluconazole (DIFLUCAN) 150 MG tablet Take 1 tablet (150 mg total) by mouth daily. Take second dose 72 hours later if symptoms still persists. 12/26/19  Yes Yu, Amy V, PA-C  furosemide (LASIX) 40 MG tablet Take 1 tablet (40 mg total) by mouth daily. Must keep appt or further refills 02/28/20  Yes Jaydrien Wassenaar, Otila Kluver A, FNP  glimepiride (AMARYL) 1 MG tablet Take 1 tablet by mouth daily. 06/15/17  Yes [provider]  hydrALAZINE (APRESOLINE) 25 MG tablet Take 1 tablet (25 mg total) by mouth 2 (two) times daily. 07/19/19  Yes Fritzi Mandes, MD  levothyroxine (SYNTHROID) 137 MCG tablet Take 137 mcg by mouth daily before breakfast.   Yes [provider]  lovastatin (MEVACOR) 20 MG tablet Take 2 tablets (40 mg total) by mouth at bedtime. 02/06/14  Yes Susy Frizzle, MD  Melatonin 10 MG CAPS Take 30 mg by mouth.   Yes [provider]  pantoprazole (PROTONIX) 40 MG tablet Take 1 tablet (40 mg total) by mouth daily. 07/20/19  Yes Fritzi Mandes, MD  potassium chloride (KLOR-CON) 10 MEQ tablet Take 2 tablets (20 mEq total) by mouth daily. 07/03/19  Yes Alisa Graff, FNP  traMADol (ULTRAM) 50 MG tablet Take 1 tablet (50 mg total) by mouth every 12 (twelve) hours as needed for severe pain. 07/19/19 07/18/20 Yes Fritzi Mandes, MD  vitamin B-12  (CYANOCOBALAMIN) 1000 MCG tablet Take 1,000 mcg by mouth daily.   Yes [provider]    Review of Systems  Constitutional: Positive for fatigue. Negative for appetite change.  HENT: Negative for congestion, postnasal drip and sore throat.   Eyes: Negative.   Respiratory: Positive for shortness of breath (with moderate exertion; improves upon rest). Negative for cough.   Cardiovascular: Negative for chest pain, palpitations and leg swelling.  Gastrointestinal: Negative for abdominal distention, abdominal pain and nausea.  Endocrine: Negative.   Genitourinary: Negative.   Musculoskeletal: Negative for back pain and neck pain.  Skin: Negative.   Allergic/Immunologic: Negative.   Neurological: Negative for dizziness, weakness and light-headedness.  Hematological: Negative  for adenopathy. Does not bruise/bleed easily.  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 Negative for dysphoric mood. The patient is not nervous/anxious.    Vitals:   03/08/20 1159  BP: (!) 124/58  Pulse: 62  Resp: 16  SpO2: 100%  Weight: 197 lb 4 oz (89.5 kg)  Height: 5\' 6"  (1.676 m)   Wt Readings from Last 3 Encounters:  03/08/20 197 lb 4 oz (89.5 kg)  09/29/19 206 lb (93.4 kg)  09/28/19 208 lb 2 oz (94.4 kg)   Lab Results  Component Value Date   CREATININE 1.87 (H) 07/19/2019   CREATININE 1.87 (H) 07/18/2019   CREATININE 1.63 (H) 07/17/2019    Physical Exam Vitals and nursing note reviewed.  Constitutional:      Appearance: She is well-developed.  HENT:     Head: Normocephalic and atraumatic.  Eyes:     Extraocular Movements: Extraocular movements intact.     Pupils: Pupils are equal, round, and reactive to light.  Neck:     Vascular: No JVD.  Cardiovascular:     Rate and Rhythm: Normal rate and regular rhythm.  Pulmonary:     Effort: Pulmonary effort is normal. No respiratory distress.     Breath sounds:  No wheezing, rhonchi or rales.  Abdominal:     Palpations: Abdomen is soft.     Tenderness: There is no abdominal tenderness.  Musculoskeletal:     Cervical back: Normal range of motion and neck supple.     Right lower leg: No tenderness. No edema.     Left lower leg: No tenderness. No edema.  Skin:    General: Skin is warm and dry.  Neurological:     General: No focal deficit present.     Mental Status: She is alert and oriented to person, place, and time.  Psychiatric:        Mood and Affect: Mood normal.        Behavior: Behavior normal.     Assessment & Plan:  1: Chronic heart failure with reduced ejection fraction- - NYHA class II - euvolemic today - weighing daily; reminded to call for an overnight weight gain of >2 pounds or a weekly weight gain of >5 pounds - weight down 11 pounds from last visit here 5 months ago - not adding salt and tries to eat low sodium and has been diligent about reading food labels - has been bradycardic in the past so currently not on a beta-blocker - saw cardiology Nehemiah Massed) 08/03/19 - says that she continues to garden and doing a lot of walking every day and feels "great" - BNP 06/30/19 was 3314.0 - has received both her COVID vaccines  2: HTN- - BP looks good today. - saw PCP Ouida Sills) 12/07/19 - BMP 11/09/19 reviewed and showed sodium 141, potassium 4.8, creatinine 2.1 and GFR 23   Patient did not bring her medications nor a list. Each medication was verbally reviewed with the patient and she was encouraged to bring the bottles to every visit to confirm accuracy of list.  Due to HF stability, will not make a return appointment at this time. Advised patient that she could call back at anytime to schedule another appointment and she was comfortable with this plan.

## 2020-03-08 ENCOUNTER — Encounter: Payer: Self-pay | Admitting: Family

## 2020-03-08 ENCOUNTER — Other Ambulatory Visit: Payer: Self-pay

## 2020-03-08 ENCOUNTER — Ambulatory Visit: Payer: Medicare Other | Attending: Family | Admitting: Family

## 2020-03-08 VITALS — BP 124/58 | HR 62 | Resp 16 | Ht 66.0 in | Wt 197.2 lb

## 2020-03-08 DIAGNOSIS — Z951 Presence of aortocoronary bypass graft: Secondary | ICD-10-CM | POA: Diagnosis not present

## 2020-03-08 DIAGNOSIS — I5022 Chronic systolic (congestive) heart failure: Secondary | ICD-10-CM

## 2020-03-08 DIAGNOSIS — Z79899 Other long term (current) drug therapy: Secondary | ICD-10-CM | POA: Diagnosis not present

## 2020-03-08 DIAGNOSIS — I252 Old myocardial infarction: Secondary | ICD-10-CM | POA: Diagnosis not present

## 2020-03-08 DIAGNOSIS — G4730 Sleep apnea, unspecified: Secondary | ICD-10-CM | POA: Insufficient documentation

## 2020-03-08 DIAGNOSIS — E785 Hyperlipidemia, unspecified: Secondary | ICD-10-CM | POA: Insufficient documentation

## 2020-03-08 DIAGNOSIS — E063 Autoimmune thyroiditis: Secondary | ICD-10-CM | POA: Diagnosis not present

## 2020-03-08 DIAGNOSIS — Z9884 Bariatric surgery status: Secondary | ICD-10-CM | POA: Insufficient documentation

## 2020-03-08 DIAGNOSIS — E1122 Type 2 diabetes mellitus with diabetic chronic kidney disease: Secondary | ICD-10-CM | POA: Diagnosis not present

## 2020-03-08 DIAGNOSIS — Z8673 Personal history of transient ischemic attack (TIA), and cerebral infarction without residual deficits: Secondary | ICD-10-CM | POA: Diagnosis not present

## 2020-03-08 DIAGNOSIS — I130 Hypertensive heart and chronic kidney disease with heart failure and stage 1 through stage 4 chronic kidney disease, or unspecified chronic kidney disease: Secondary | ICD-10-CM | POA: Insufficient documentation

## 2020-03-08 DIAGNOSIS — M1990 Unspecified osteoarthritis, unspecified site: Secondary | ICD-10-CM | POA: Insufficient documentation

## 2020-03-08 DIAGNOSIS — I2510 Atherosclerotic heart disease of native coronary artery without angina pectoris: Secondary | ICD-10-CM | POA: Diagnosis not present

## 2020-03-08 DIAGNOSIS — N189 Chronic kidney disease, unspecified: Secondary | ICD-10-CM | POA: Diagnosis not present

## 2020-03-08 DIAGNOSIS — Z7902 Long term (current) use of antithrombotics/antiplatelets: Secondary | ICD-10-CM | POA: Diagnosis not present

## 2020-03-08 DIAGNOSIS — Z7982 Long term (current) use of aspirin: Secondary | ICD-10-CM | POA: Insufficient documentation

## 2020-03-08 DIAGNOSIS — I10 Essential (primary) hypertension: Secondary | ICD-10-CM

## 2020-03-08 DIAGNOSIS — Z7984 Long term (current) use of oral hypoglycemic drugs: Secondary | ICD-10-CM | POA: Insufficient documentation

## 2020-03-08 NOTE — Patient Instructions (Addendum)
Continue weighing daily and call for an overnight weight gain of > 2 pounds or a weekly weight gain of >5 pounds.   Call us in the future if you'd like to schedule another appointment 

## 2020-06-15 ENCOUNTER — Ambulatory Visit: Payer: Medicare Other | Attending: Internal Medicine

## 2020-06-15 DIAGNOSIS — Z23 Encounter for immunization: Secondary | ICD-10-CM

## 2020-06-15 IMAGING — CT CT ABD-PELV W/ CM
2 of 5 series · 15 of 46 positions shown, 17 images · IV contrast (APPLIED)
Comparison: Abdomen ultrasound report dated 02/05/2006

CLINICAL DATA: Right lower quadrant pain for the past week, worse
with movement. Previous cholecystectomy and hysterectomy.

EXAM:
CT ABDOMEN AND PELVIS WITH CONTRAST
TECHNIQUE: Multidetector CT imaging of the abdomen and pelvis was performed
using the standard protocol following bolus administration of
intravenous contrast.
CONTRAST:  75mL OMNIPAQUE IOHEXOL 300 MG/ML  SOLN

[Series 2: routine abd/pel with · axial · 0.87mm/px · z∈[-886,-426]mm · 12 of 104 slices shown, 14 images]
[im 6/104  soft-tissue]
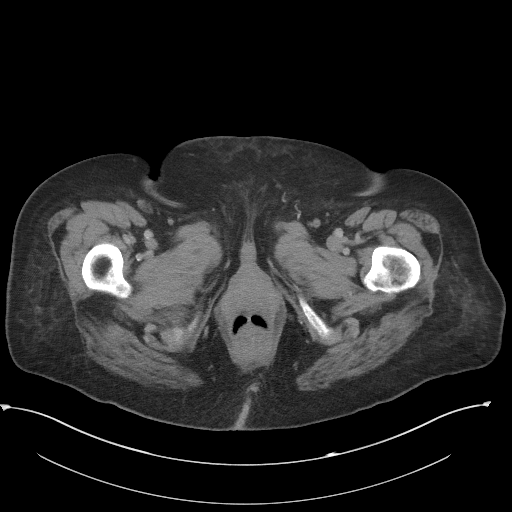
[im 6/104  bone]
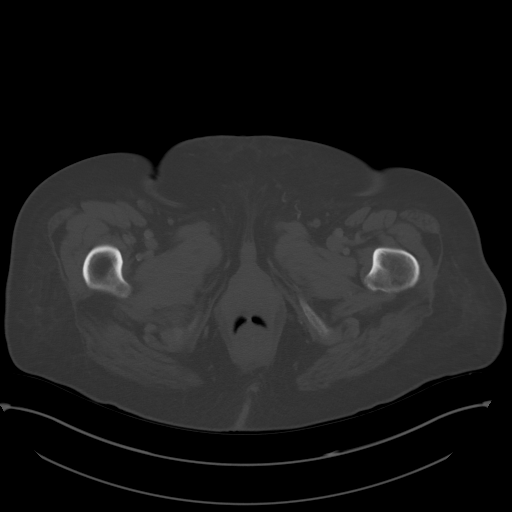
[im 18/104  soft-tissue]
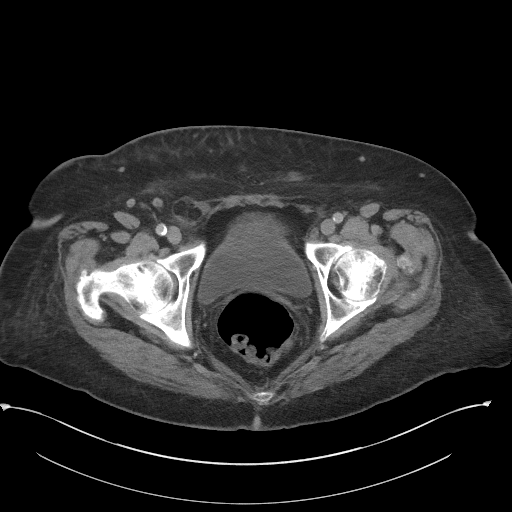
[im 23/104  soft-tissue]
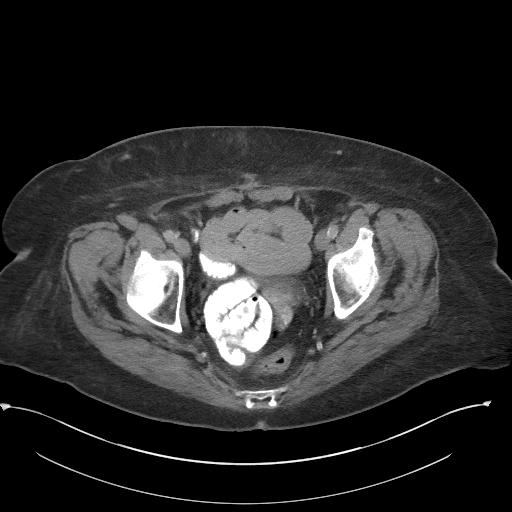
[im 29/104  soft-tissue]
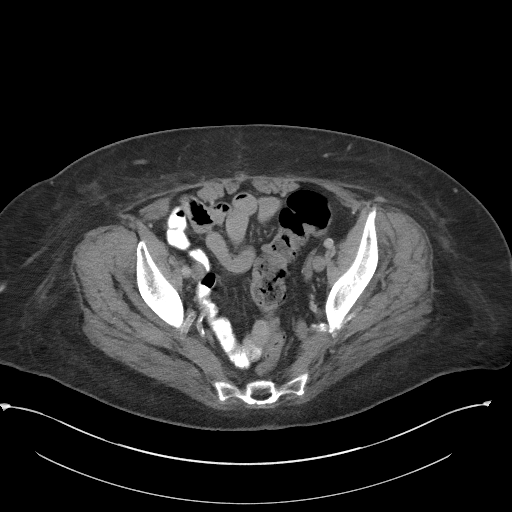
[im 41/104  soft-tissue]
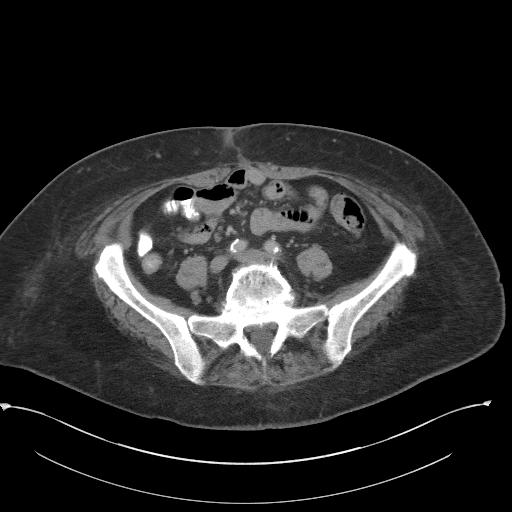
[im 46/104  soft-tissue]
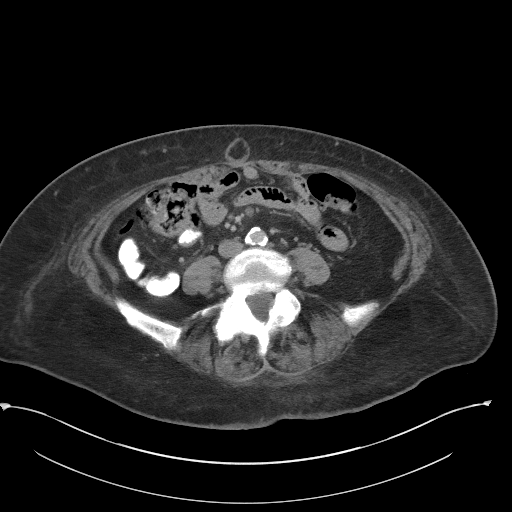
[im 58/104  soft-tissue]
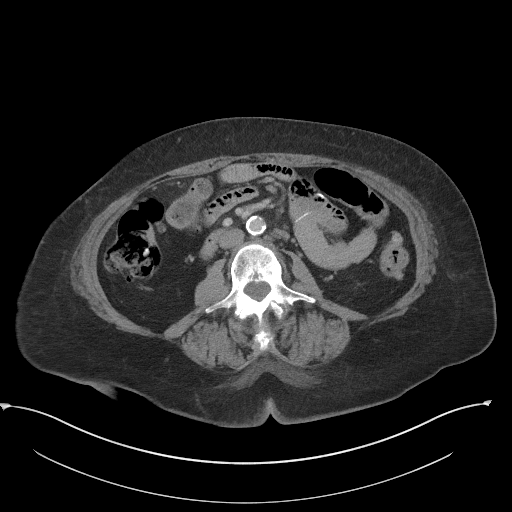
[im 63/104  soft-tissue]
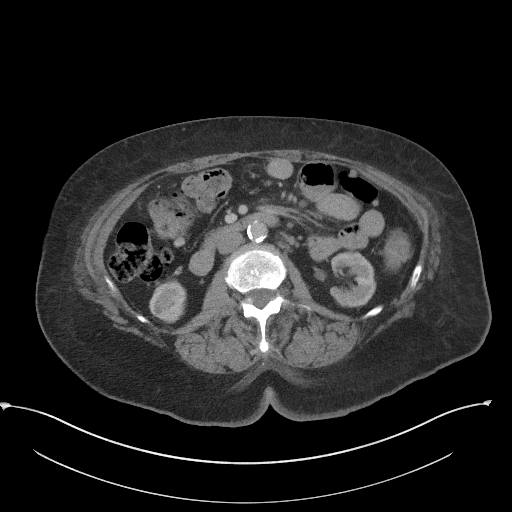
[im 75/104  soft-tissue]
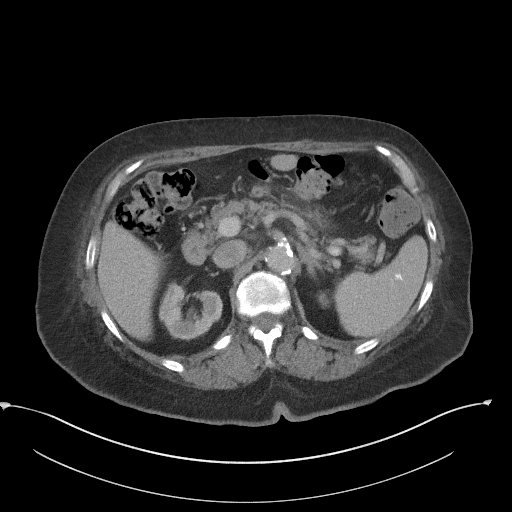
[im 75/104  bone]
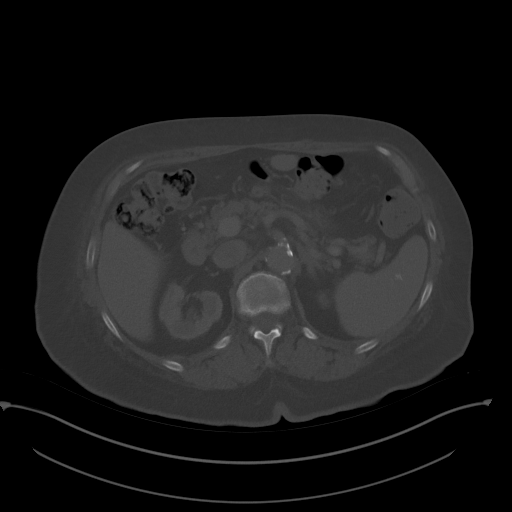
[im 81/104  soft-tissue]
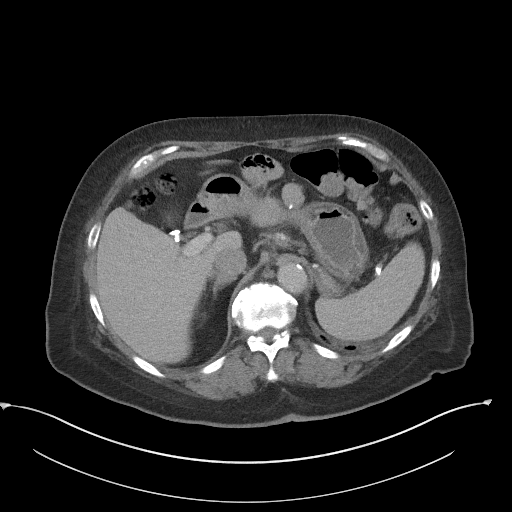
[im 86/104  soft-tissue]
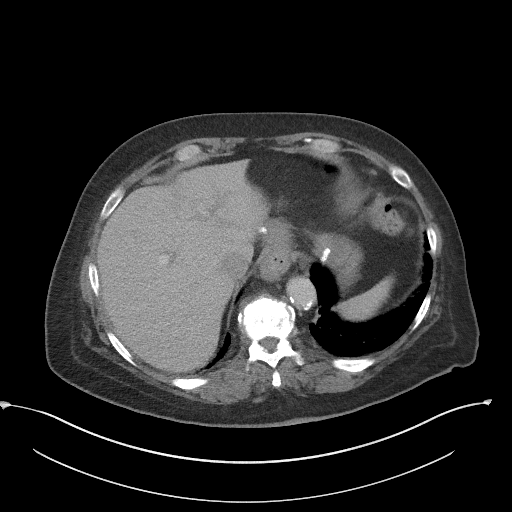
[im 98/104  soft-tissue]
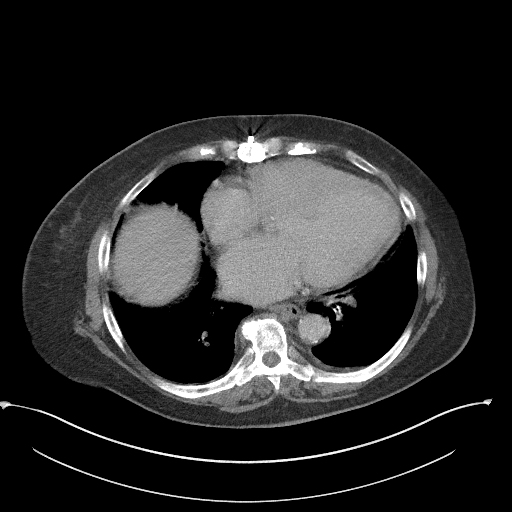

[Series 5: coronal st · coronal · 0.81mm/px · 3 of 89 slices shown]
[im 30/89  soft-tissue]
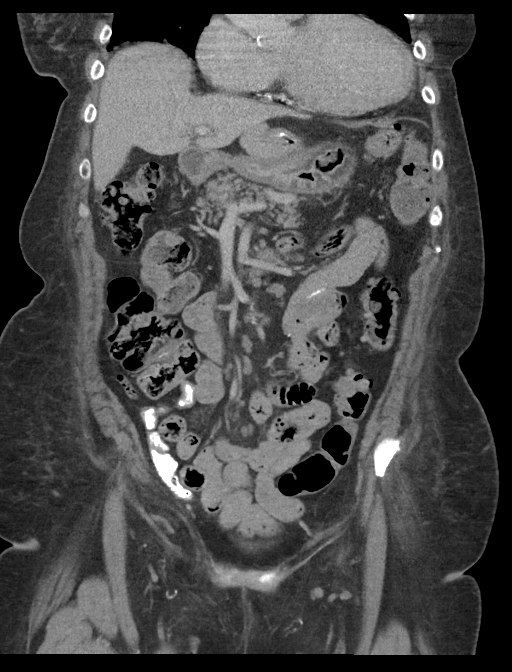
[im 40/89  soft-tissue]
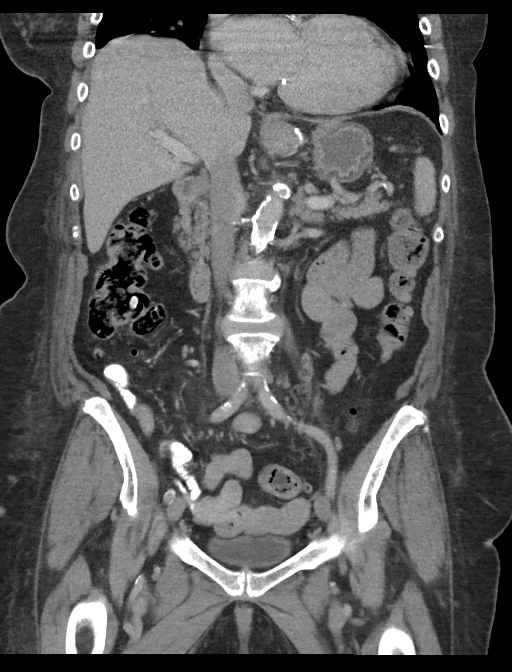
[im 49/89  soft-tissue]
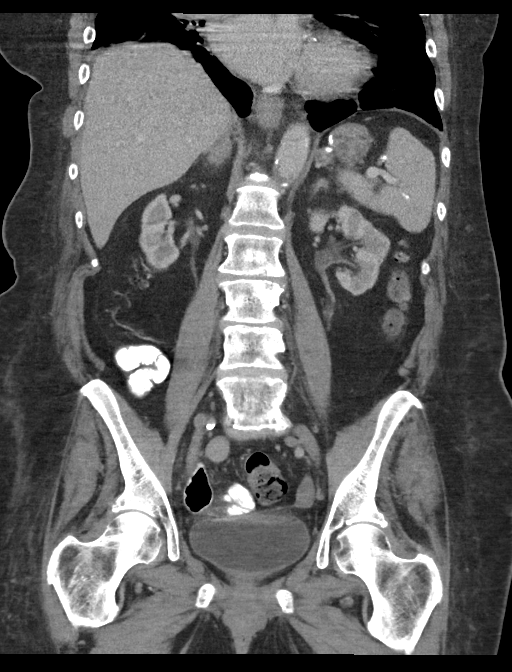

[15 of 46 positions shown; findings below may reference images not displayed]

FINDINGS: Lower chest: Enlarged heart. Atheromatous aortic and coronary artery
calcifications. Post CABG changes. Mild bullous change in the left
lower lobe.

Hepatobiliary: No focal liver abnormality is seen. Status post
cholecystectomy. No biliary dilatation.

Pancreas: Mild to moderate diffuse pancreatic atrophy.

Spleen: Small splenic calcification. Normal in size and shape.

Adrenals/Urinary Tract: Normal appearing adrenal glands. 2 mm
calculus in each kidney. Small bilateral renal cysts. Unremarkable
urinary bladder and ureters.

Stomach/Bowel: Diffuse low density gastric wall thickening. Small
hiatal hernia. Small bowel anastomosis in the left mid abdomen.
Approximately 5 cm long segment of distal ileum in the upper right
pelvis with diffuse wall thickening measuring up to 9 mm in
thickness and with marked luminal narrowing. There is a suggestion
of associated shouldering superiorly. No abnormal bowel dilatation
seen. Multiple colonic diverticula without evidence of
diverticulitis. Normal appearing appendix.

Vascular/Lymphatic: Atheromatous arterial calcifications without
aneurysm. No enlarged lymph nodes.

Other: Small umbilical hernia containing fat and small proximal
right inguinal hernia containing fat.

Musculoskeletal: Left L5 pars defect with minimal anterolisthesis at
the L5-S1 level. Lumbar and lower thoracic spine degenerative
changes.
IMPRESSION: 1. 5 cm long segment of distal ileum in the right upper pelvis with
diffuse wall thickening and marked luminal narrowing. Based on the
mucosal pattern, this is most likely a normal loop image during
peristalsis. There are no surrounding inflammatory changes to
indicate acute enteritis in the mucosal pattern does not have the
typical appearance of a neoplasm.
2. Otherwise, no acute abnormality in the right lower quadrant of
the abdomen.
3. Diffuse low density gastric wall thickening, compatible with
gastritis.
4. Small hiatal hernia.
5. Colonic diverticulosis.
6. Small, nonobstructing bilateral renal calculi.

## 2020-06-15 NOTE — Progress Notes (Signed)
   Covid-19 Vaccination Clinic  Name:  [name]    MRN: [id number] DOB: 1950/12/10  06/15/2020  [name] was observed post Covid-19 immunization for 15 minutes without incident. She was provided with Vaccine Information Sheet and instruction to access the V-Safe system.   [name] was instructed to call 911 with any severe reactions post vaccine: Marland Kitchen Difficulty breathing  . Swelling of face and throat  . A fast heartbeat  . A bad rash all over body  . Dizziness and weakness   Immunizations Administered    Name Date Dose VIS Date Route   Pfizer COVID-19 Vaccine 06/15/2020 11:13 AM 0.3 mL 03/27/2020 Intramuscular   Manufacturer: [place]   Lot: [id number]   NDC: [address]-4

## 2020-08-18 ENCOUNTER — Encounter: Payer: Self-pay | Admitting: *Deleted

## 2020-08-18 ENCOUNTER — Ambulatory Visit
Admission: EM | Admit: 2020-08-18 | Discharge: 2020-08-18 | Disposition: A | Discharge status: Home / Self Care | Payer: Medicare Other | Attending: Emergency Medicine | Admitting: Emergency Medicine

## 2020-08-18 ENCOUNTER — Other Ambulatory Visit: Payer: Self-pay

## 2020-08-18 DIAGNOSIS — N3000 Acute cystitis without hematuria: Secondary | ICD-10-CM | POA: Insufficient documentation

## 2020-08-18 LAB — POCT URINALYSIS DIP (MANUAL ENTRY)
Bilirubin, UA: NEGATIVE
Blood, UA: NEGATIVE
Glucose, UA: NEGATIVE mg/dL
Ketones, POC UA: NEGATIVE mg/dL
Nitrite, UA: POSITIVE — AB
Protein Ur, POC: NEGATIVE mg/dL
Spec Grav, UA: 1.025 (ref 1.010–1.025)
Urobilinogen, UA: 0.2 E.U./dL
pH, UA: 5.5 (ref 5.0–8.0)

## 2020-08-18 MED ORDER — CEPHALEXIN 500 MG PO CAPS: 500.0000 mg | ORAL_CAPSULE | Freq: Two times a day (BID) | ORAL | 0 refills | Status: AC

## 2020-08-18 MED ORDER — CEPHALEXIN 500 MG PO CAPS: 500.0000 mg | ORAL_CAPSULE | Freq: Four times a day (QID) | ORAL | 0 refills | Status: DC

## 2020-08-18 NOTE — Discharge Instructions (Addendum)
You urine is consistent with UTI.  Complete course of antibiotics.  Drink plenty of water to empty bladder regularly. Avoid alcohol and caffeine as these may irritate the bladder.   If symptoms worsen or do not improve in the next week to return to be seen or to follow up with your PCP.

## 2020-08-18 NOTE — ED Provider Notes (Signed)
[facility]    CSN: [id number] Arrival date & time: 08/18/20  0906      History   Chief Complaint Chief Complaint  Patient presents with  . Polyuria    HPI [name] is a 70 y.o. female.   [name] presents with complaints of urinary frequency, urgency, burning with urination, and pelvic pressure. Started two days ago. Similar to previous UTI's. No back pain, no vaginal symptoms, no bleeding, no fevers. Hysterectomy in the past.     ROS per HPI, negative if not otherwise mentioned.      Past Medical History:  Diagnosis Date  . Anemia   . Anemia, iron deficiency 11/07/2013  . Arthritis   . CAD (coronary artery disease)   . Cerebrovascular accident ([place])   . CHF (congestive heart failure) ([place])   . Chicken pox   . Chronic kidney disease   . Colitis    ? at 17  . Diabetes mellitus   . Hashimoto thyroiditis    w subsequent hypothyroidism  . Headache   . HTN (hypertension)   . Hyperlipidemia   . Hypothyroidism   . Myocardial infarction ([place])   . 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 Sleep apnea     Patient Active Problem List   Diagnosis Date Noted  . Acute on chronic systolic CHF (congestive heart failure), NYHA class 3 ([place]) 08/03/2019  . Type 2 diabetes mellitus with diabetic peripheral angiopathy without gangrene, without long-term current use of insulin ([place])   . Ischemic pain of left foot   . PAD (peripheral artery disease) ([place])   . Ischemic foot 07/15/2019  . H/O: stroke 07/15/2019  . Healthcare maintenance 11/30/2018  . Amaurosis fugax of right eye 12/14/2017  . Bilateral leg edema 09/28/2017  . Hyperlipidemia, mixed 09/28/2017  . Lesion of esophagus   . Acute gastric ulcer without hemorrhage or perforation   . Anemia 09/01/2017  . DOE (dyspnea on exertion) 09/01/2017  . Chronic insomnia 01/26/2017  . Postmenopausal 08/05/2016  . Low TSH level 01/12/2016  . Coronary artery disease with  hx of myocardial infarct w/o hx of CABG 05/21/2015  . Chronic kidney disease, stage III (moderate) ([place]) 10/16/2014  . Acquired hypothyroidism 08/06/2014  . Type 2 diabetes mellitus without complication ([place]) 123XX123  . Anemia, iron deficiency 11/07/2013  . 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 Heme + stool 11/07/2013  . Feces contents abnormal 11/07/2013  . HYPERTENSION, BENIGN 11/10/2008  . CAD, ARTERY BYPASS GRAFT 11/10/2008    Past Surgical History:  Procedure Laterality Date  . ABDOMINAL HYSTERECTOMY    . CHOLECYSTECTOMY    . COLONOSCOPY WITH PROPOFOL N/A 12/24/2014   Procedure: COLONOSCOPY WITH PROPOFOL;  Surgeon: Hulen Luster, MD;  Location: [facility] ENDOSCOPY;  Service: Gastroenterology;  Laterality: N/A;  . CORONARY ARTERY BYPASS GRAFT    . ESOPHAGOGASTRODUODENOSCOPY N/A 09/02/2017   Procedure: ESOPHAGOGASTRODUODENOSCOPY (EGD);  Surgeon: Virgel Manifold, MD;  Location: [facility] ENDOSCOPY;  Service: Endoscopy;  Laterality: N/A;  . ESOPHAGOGASTRODUODENOSCOPY (EGD) WITH PROPOFOL N/A 12/24/2014   Procedure: ESOPHAGOGASTRODUODENOSCOPY (EGD) WITH PROPOFOL;  Surgeon: Hulen Luster, MD;  Location: [facility] ENDOSCOPY;  Service: Gastroenterology;  Laterality: N/A;  . JOINT REPLACEMENT    . LOWER EXTREMITY ANGIOGRAPHY Left 07/17/2019   Procedure: Lower Extremity Angiography;  Surgeon: Algernon Huxley, MD;  Location: [place] CV LAB;  Service: Cardiovascular;  Laterality: Left;  . ROUX-EN-Y GASTRIC BYPASS     loss over  100 pounds  . TONSILLECTOMY    . TOTAL KNEE ARTHROPLASTY Left     OB History   No obstetric history on file.      Home Medications    Prior to Admission medications   Medication Sig Start Date End Date Taking? Authorizing Provider  aspirin EC 81 MG tablet Take 81 mg by mouth daily.    Yes [provider]  benazepril (LOTENSIN) 40 MG tablet TAKE ONE TABLET BY MOUTH ONCE DAILY Patient taking differently: Take 40 mg by mouth daily.  08/24/14  Yes Susy Frizzle, MD  cephALEXin (KEFLEX) 500 MG capsule Take 1 capsule (500 mg total) by mouth 2 (two) times daily for 7 days. 08/18/20 08/25/20 Yes Maripat Borba, Lanelle Bal B, NP  clopidogrel (PLAVIX) 75 MG tablet Take 1 tablet (75 mg total) by mouth daily. 09/29/19  Yes Kris Hartmann, NP  clotrimazole-betamethasone (LOTRISONE) cream Apply to affected area 2 times daily prn 12/26/19  Yes Yu, Amy V, PA-C  Ferrous Sulfate (SLOW FE) 142 (45 Fe) MG TBCR Take 1 tablet by mouth 2 (two) times a week.   Yes [provider]  furosemide (LASIX) 40 MG tablet Take 1 tablet (40 mg total) by mouth daily. Must keep appt or further refills 02/28/20  Yes Hackney, Otila Kluver A, FNP  glimepiride (AMARYL) 1 MG tablet Take 1 tablet by mouth daily. 06/15/17  Yes [provider]  hydrALAZINE (APRESOLINE) 25 MG tablet Take 1 tablet (25 mg total) by mouth 2 (two) times daily. 07/19/19  Yes Fritzi Mandes, MD  levothyroxine (SYNTHROID) 137 MCG tablet Take 137 mcg by mouth daily before breakfast.   Yes [provider]  lovastatin (MEVACOR) 20 MG tablet Take 2 tablets (40 mg total) by mouth at bedtime. 02/06/14  Yes Susy Frizzle, MD  Melatonin 10 MG CAPS Take 30 mg by mouth.   Yes [provider]  potassium chloride (KLOR-CON) 10 MEQ tablet Take 2 tablets (20 mEq total) by mouth daily. 07/03/19  Yes Hackney, Otila Kluver A, FNP  vitamin B-12 (CYANOCOBALAMIN) 1000 MCG tablet Take 1,000 mcg by mouth daily.   Yes [provider]  benzonatate (TESSALON) 200 MG capsule Take 1 capsule (200 mg total) by mouth every 8 (eight) hours. 12/26/19   Tasia Catchings, Amy V, PA-C  cloNIDine (CATAPRES) 0.1 MG tablet Take 0.1 mg by mouth 2 (two) times daily.    [provider]  doxycycline (VIBRAMYCIN) 100 MG capsule Take 1 capsule (100 mg total) by mouth 2 (two) times daily. 12/26/19   Tasia Catchings, Amy V, PA-C  fluconazole (DIFLUCAN) 150 MG tablet Take 1 tablet (150 mg total) by mouth daily. Take second dose 72 hours later if  symptoms still persists. 12/26/19   Tasia Catchings, Amy V, PA-C  pantoprazole (PROTONIX) 40 MG tablet Take 1 tablet (40 mg total) by mouth daily. 07/20/19   Fritzi Mandes, MD    Family History Family History  Problem Relation Age of Onset  . Coronary artery disease Other   . Stroke Maternal Grandmother   . Diabetes Paternal Grandmother        diabetic coma  . Heart attack Father   . Heart attack Mother   . Heart attack Maternal Grandfather   . Heart attack Paternal Grandfather   . Breast cancer Neg Hx     Social History Social History   Tobacco Use  . Smoking status: Never Smoker  . Smokeless tobacco: Never Used  Vaping Use  . Vaping Use: Never used  Substance Use Topics  .  Alcohol use: No  . Drug use: No     Allergies   Niacin, Ultram [tramadol hcl], Baclofen, Cardura [doxazosin mesylate], Morphine and related, and Other   Review of Systems Review of Systems   Physical Exam Triage Vital Signs ED Triage Vitals  Enc Vitals Group     BP 08/18/20 0915 110/65     Pulse Rate 08/18/20 0915 (!) 54     Resp 08/18/20 0915 20     Temp 08/18/20 0915 98.2 F (36.8 C)     Temp Source 08/18/20 0915 Oral     SpO2 08/18/20 0915 98 %     Weight --      Height --      Head Circumference --      Peak Flow --      Pain Score 08/18/20 0916 9     Pain Loc --      Pain Edu? --      Excl. in [place]? --    No data found.  Updated Vital Signs BP 110/65 (BP Location: Left Arm)   Pulse (!) 54   Temp 98.2 F (36.8 C) (Oral)   Resp 20   SpO2 98%    Physical Exam Constitutional:      General: She is not in acute distress.    Appearance: She is well-developed.  Cardiovascular:     Rate and Rhythm: Normal rate.  Pulmonary:     Effort: Pulmonary effort is normal.  Abdominal:     Tenderness: There is no abdominal tenderness. There is no right CVA tenderness or left CVA tenderness.  Skin:    General: Skin is warm and dry.  Neurological:     Mental Status: She is alert and oriented to  person, place, and time.      UC Treatments / Results  Labs (all labs ordered are listed, but only abnormal results are displayed) Labs Reviewed  POCT URINALYSIS DIP (MANUAL ENTRY) - Abnormal; Notable for the following components:      Result Value   Clarity, UA hazy (*)    Nitrite, UA Positive (*)    Leukocytes, UA Trace (*)    All other components within normal limits  URINE CULTURE    EKG   Radiology No results found.  Procedures Procedures (including critical care time)  Medications Ordered in UC Medications - No data to display  Initial Impression / Assessment and Plan / UC Course  I have reviewed the triage vital signs and the nursing notes.  Pertinent labs & imaging results that were available during my care of the patient were reviewed by me and considered in my medical decision making (see chart for details).     UA consistent with UTI here today with keflex initiated, pending urine culture. Return precautions provided. Patient verbalized understanding and agreeable to plan.   Final Clinical Impressions(s) / UC Diagnoses   Final diagnoses:  Acute cystitis without hematuria     Discharge Instructions     You urine is consistent with UTI.  Complete course of antibiotics.  Drink plenty of water to empty bladder regularly. Avoid alcohol and caffeine as these may irritate the bladder.   If symptoms worsen or do not improve in the next week to return to be seen or to follow up with your PCP.      ED Prescriptions    Medication Sig Dispense Auth. Provider   cephALEXin (KEFLEX) 500 MG capsule  (Status: Discontinued) Take 1 capsule (500 mg total) by  mouth 4 (four) times daily for 7 days. 28 capsule Augusto Gamble B, NP   cephALEXin (KEFLEX) 500 MG capsule Take 1 capsule (500 mg total) by mouth 2 (two) times daily for 7 days. 14 capsule Zigmund Gottron, NP     PDMP not reviewed this encounter.   Zigmund Gottron, NP 08/18/20 [id number]

## 2020-08-18 NOTE — ED Triage Notes (Signed)
C/O urinary urgency and frequency with polyuria and "heaviness where the bladder lays".  Denies fevers.  Denies n/v/d.  Denies flank pain.

## 2020-08-20 LAB — URINE CULTURE: Culture: >=100000 — AB

## 2020-09-02 ENCOUNTER — Other Ambulatory Visit: Payer: Self-pay | Admitting: Family

## 2020-09-06 ENCOUNTER — Other Ambulatory Visit: Payer: Self-pay | Admitting: Family

## 2021-03-27 ENCOUNTER — Encounter: Payer: Self-pay | Admitting: Emergency Medicine

## 2021-03-27 ENCOUNTER — Ambulatory Visit
Admission: EM | Admit: 2021-03-27 | Discharge: 2021-03-27 | Disposition: A | Discharge status: Home / Self Care | Payer: Medicare Other | Attending: Physician Assistant | Admitting: Physician Assistant

## 2021-03-27 ENCOUNTER — Other Ambulatory Visit: Payer: Self-pay

## 2021-03-27 DIAGNOSIS — N3000 Acute cystitis without hematuria: Secondary | ICD-10-CM | POA: Diagnosis not present

## 2021-03-27 LAB — POCT URINALYSIS DIP (MANUAL ENTRY)
Bilirubin, UA: NEGATIVE
Blood, UA: NEGATIVE
Glucose, UA: NEGATIVE mg/dL
Ketones, POC UA: NEGATIVE mg/dL
Nitrite, UA: POSITIVE — AB
Protein Ur, POC: NEGATIVE mg/dL
Spec Grav, UA: 1.02 (ref 1.010–1.025)
Urobilinogen, UA: 0.2 E.U./dL
pH, UA: 5.5 (ref 5.0–8.0)

## 2021-03-27 MED ORDER — NITROFURANTOIN MONOHYD MACRO 100 MG PO CAPS: 100.0000 mg | ORAL_CAPSULE | Freq: Two times a day (BID) | ORAL | 0 refills | Status: DC

## 2021-03-27 MED ORDER — FLUCONAZOLE 150 MG PO TABS: ORAL_TABLET | ORAL | 0 refills | Status: DC

## 2021-03-27 NOTE — ED Provider Notes (Signed)
[facility]    CSN: [id number] Arrival date & time: 03/27/21  1533      History   Chief Complaint Chief Complaint  Patient presents with   Dysuria    HPI [name] is a 70 y.o. female.   Here today for evaluation of dysuria, urine urgency and frequency that started this morning.  She does not have fever or chills.  She denies any back pain.  She had some lower abdominal burning but no nausea or vomiting.  She has not tried any treatment for symptoms.  The history is provided by the patient.  Dysuria Associated symptoms: abdominal pain   Associated symptoms: no fever, no nausea and no vomiting    Past Medical History:  Diagnosis Date   Anemia    Anemia, iron deficiency 11/07/2013   Arthritis    CAD (coronary artery disease)    Cerebrovascular accident [facility])    CHF (congestive heart failure) (H[facility])    Chicken pox    Chronic kidney disease    Colitis    ? 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   Sleep apnea     Patient Active Problem List   Diagnosis Date Noted   Acute on chronic systolic CHF (congestive heart failure), NYHA class 3 ([place]) 08/03/2019   Type 2 diabetes mellitus with diabetic peripheral angiopathy without gangrene, without long-term current use of insulin (H[facility])    Ischemic pain of left foot    PAD (peripheral artery disease) ([place])    Ischemic foot 07/15/2019   H/O: stroke 07/15/2019   Healthcare maintenance 11/30/2018   Amaurosis fugax of right eye 12/14/2017   Bilateral leg edema 09/28/2017   Hyperlipidemia, mixed 09/28/2017   Lesion of esophagus    Acute gastric ulcer without hemorrhage or perforation    Anemia 09/01/2017   DOE (dyspnea on exertion) 09/01/2017   Chronic insomnia 01/26/2017   Postmenopausal  08/05/2016   Low TSH level 01/12/2016   Coronary artery disease with hx of myocardial infarct w/o hx of CABG 05/21/2015   Chronic kidney disease, stage III (moderate) ([place]) 10/16/2014   Acquired hypothyroidism 08/06/2014   Type 2 diabetes mellitus without complication ([place]) 123XX123   Anemia, iron deficiency 11/07/2013   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   Heme + stool 11/07/2013   Feces contents abnormal 11/07/2013   HYPERTENSION, BENIGN 11/10/2008   CAD, ARTERY BYPASS GRAFT 11/10/2008    Past Surgical History:  Procedure Laterality Date   ABDOMINAL HYSTERECTOMY     CHOLECYSTECTOMY     COLONOSCOPY WITH PROPOFOL N/A 12/24/2014   Procedure: COLONOSCOPY WITH PROPOFOL;  Surgeon: Hulen Luster, MD;  Location: [facility] ENDOSCOPY;  Service: Gastroenterology;  Laterality: N/A;   CORONARY ARTERY BYPASS GRAFT     ESOPHAGOGASTRODUODENOSCOPY N/A 09/02/2017   Procedure: ESOPHAGOGASTRODUODENOSCOPY (EGD);  Surgeon: Virgel Manifold, MD;  Location: [facility] ENDOSCOPY;  Service: Endoscopy;  Laterality: N/A;   ESOPHAGOGASTRODUODENOSCOPY (EGD) WITH PROPOFOL N/A 12/24/2014   Procedure: ESOPHAGOGASTRODUODENOSCOPY (EGD) WITH PROPOFOL;  Surgeon: Hulen Luster, MD;  Location: [facility] ENDOSCOPY;  Service: Gastroenterology;  Laterality: N/A;   JOINT REPLACEMENT     LOWER EXTREMITY ANGIOGRAPHY Left 07/17/2019   Procedure: Lower Extremity Angiography;  Surgeon: Algernon Huxley, MD;  Location: [place] CV LAB;  Service: Cardiovascular;  Laterality: Left;   ROUX-EN-Y GASTRIC BYPASS     loss over 100 pounds   TONSILLECTOMY     TOTAL KNEE ARTHROPLASTY Left     OB History   No obstetric history on file.      Home Medications    Prior to Admission medications   Medication Sig Start Date End Date Taking? Authorizing Provider  aspirin EC 81 MG tablet Take 81 mg by mouth daily.    Yes [provider]  benazepril (LOTENSIN) 40 MG tablet TAKE ONE TABLET BY MOUTH ONCE  DAILY Patient taking differently: Take 40 mg by mouth daily. 08/24/14  Yes Susy Frizzle, MD  benzonatate (TESSALON) 200 MG capsule Take 1 capsule (200 mg total) by mouth every 8 (eight) hours. 12/26/19  Yes Yu, Amy V, PA-C  cloNIDine (CATAPRES) 0.1 MG tablet Take 0.1 mg by mouth 2 (two) times daily.   Yes [provider]  clopidogrel (PLAVIX) 75 MG tablet Take 1 tablet (75 mg total) by mouth daily. 09/29/19  Yes Kris Hartmann, NP  clotrimazole-betamethasone (LOTRISONE) cream Apply to affected area 2 times daily prn 12/26/19  Yes Yu, Amy V, PA-C  doxycycline (VIBRAMYCIN) 100 MG capsule Take 1 capsule (100 mg total) by mouth 2 (two) times daily. 12/26/19  Yes Yu, Amy V, PA-C  Ferrous Sulfate (SLOW FE) 142 (45 Fe) MG TBCR Take 1 tablet by mouth 2 (two) times a week.   Yes [provider]  fluconazole (DIFLUCAN) 150 MG tablet Take one tab PO once, may repeat in 10 days if needed 03/27/21  Yes Francene Finders, PA-C  furosemide (LASIX) 40 MG tablet Take 1 tablet (40 mg total) by mouth daily. Must keep appt or further refills 02/28/20  Yes Hackney, Otila Kluver A, FNP  glimepiride (AMARYL) 1 MG tablet Take 1 tablet by mouth daily. 06/15/17  Yes [provider]  hydrALAZINE (APRESOLINE) 25 MG tablet Take 1 tablet (25 mg total) by mouth 2 (two) times daily. 07/19/19  Yes Fritzi Mandes, MD  levothyroxine (SYNTHROID) 137 MCG tablet Take 137 mcg by mouth daily before breakfast.   Yes [provider]  lovastatin (MEVACOR) 20 MG tablet Take 2 tablets (40 mg total) by mouth at bedtime. 02/06/14  Yes Susy Frizzle, MD  Melatonin 10 MG CAPS Take 30 mg by mouth.   Yes [provider]  nitrofurantoin, macrocrystal-monohydrate, (MACROBID) 100 MG capsule Take 1 capsule (100 mg total) by mouth 2 (two) times daily. 03/27/21  Yes Francene Finders, PA-C  pantoprazole (PROTONIX) 40 MG tablet Take 1 tablet (40 mg total) by mouth daily. 07/20/19  Yes Fritzi Mandes, MD  potassium chloride  (KLOR-CON) 10 MEQ tablet Take 2 tablets (20 mEq total) by mouth daily. 07/03/19  Yes Hackney, Otila Kluver A, FNP  vitamin B-12 (CYANOCOBALAMIN) 1000 MCG tablet Take 1,000 mcg by mouth daily.   Yes [provider]    Family History Family History  Problem Relation Age of Onset   Coronary artery disease Other    Stroke Maternal Grandmother    Diabetes Paternal Grandmother        diabetic coma   Heart attack Father    Heart attack Mother    Heart attack Maternal Grandfather    Heart attack Paternal Grandfather    Breast cancer Neg Hx     Social History Social History   Tobacco Use   Smoking status: Never   Smokeless tobacco: Never  Vaping  Use   Vaping Use: Never used  Substance Use Topics   Alcohol use: No   Drug use: No     Allergies   Niacin, Ultram [tramadol hcl], Baclofen, Cardura [doxazosin mesylate], Morphine and related, and Other   Review of Systems Review of Systems  Constitutional:  Negative for chills and fever.  Eyes:  Negative for discharge and redness.  Respiratory:  Negative for shortness of breath.   Gastrointestinal:  Positive for abdominal pain. Negative for nausea and vomiting.  Genitourinary:  Positive for dysuria, frequency and urgency.  Musculoskeletal:  Negative for back pain.    Physical Exam Triage Vital Signs ED Triage Vitals  Enc Vitals Group     BP 03/27/21 1722 118/64     Pulse Rate 03/27/21 1722 (!) 51     Resp --      Temp 03/27/21 1722 97.9 F (36.6 C)     Temp Source 03/27/21 1722 Oral     SpO2 03/27/21 1722 98 %     Weight --      Height --      Head Circumference --      Peak Flow --      Pain Score 03/27/21 1724 8     Pain Loc --      Pain Edu? --      Excl. in [place]? --    No data found.  Updated Vital Signs BP 118/64 (BP Location: Left Arm)   Pulse (!) 51   Temp 97.9 F (36.6 C) (Oral)   SpO2 98%    Physical Exam Vitals and nursing note reviewed.  Constitutional:      General: She is not in acute  distress.    Appearance: Normal appearance. She is not ill-appearing.  HENT:     Head: Normocephalic and atraumatic.     Nose: Nose normal.  Cardiovascular:     Rate and Rhythm: Normal rate and regular rhythm.     Heart sounds: Normal heart sounds. No murmur heard. Pulmonary:     Effort: Pulmonary effort is normal. No respiratory distress.     Breath sounds: Normal breath sounds. No wheezing, rhonchi or rales.  Abdominal:     General: Abdomen is flat.     Tenderness: There is no right CVA tenderness or left CVA tenderness.  Skin:    General: Skin is warm and dry.  Neurological:     Mental Status: She is alert.  Psychiatric:        Mood and Affect: Mood normal.        Thought Content: Thought content normal.     UC Treatments / Results  Labs (all labs ordered are listed, but only abnormal results are displayed) Labs Reviewed  POCT URINALYSIS DIP (MANUAL ENTRY) - Abnormal; Notable for the following components:      Result Value   Nitrite, UA Positive (*)    Leukocytes, UA Small (1+) (*)    All other components within normal limits  URINE CULTURE    EKG   Radiology No results found.  Procedures Procedures (including critical care time)  Medications Ordered in UC Medications - No data to display  Initial Impression / Assessment and Plan / UC Course  I have reviewed the triage vital signs and the nursing notes.  Pertinent labs & imaging results that were available during my care of the patient were reviewed by me and considered in my medical decision making (see chart for details).  Macrobid prescribed for suspected UTI,  urine culture ordered.  Patient requests Diflucan as she typically will get yeast infections after antibiotic treatment and typically she requires 2 doses so we will send to pharmacy as well.  Encouraged follow-up with any further concerns.  Final Clinical Impressions(s) / UC Diagnoses   Final diagnoses:  Acute cystitis without hematuria      Discharge Instructions      Take antibiotic as prescribed. Follow up with any further concerns.      ED Prescriptions     Medication Sig Dispense Auth. Provider   nitrofurantoin, macrocrystal-monohydrate, (MACROBID) 100 MG capsule Take 1 capsule (100 mg total) by mouth 2 (two) times daily. 10 capsule Francene Finders, PA-C   fluconazole (DIFLUCAN) 150 MG tablet Take one tab PO once, may repeat in 10 days if needed 2 tablet Francene Finders, PA-C      PDMP not reviewed this encounter.   Francene Finders, PA-C 03/27/21 [phone]

## 2021-03-27 NOTE — ED Triage Notes (Signed)
Patient c/o dysuria, urgency and frequency since this morning.  No hematuria.  Afebrile.

## 2021-03-27 NOTE — Discharge Instructions (Addendum)
Take antibiotic as prescribed. Follow up with any further concerns.

## 2021-03-29 LAB — URINE CULTURE: Culture: >=100000 — AB

## 2021-08-26 ENCOUNTER — Encounter (INDEPENDENT_AMBULATORY_CARE_PROVIDER_SITE_OTHER): Payer: Medicare Other

## 2021-08-26 ENCOUNTER — Ambulatory Visit (INDEPENDENT_AMBULATORY_CARE_PROVIDER_SITE_OTHER): Payer: Medicare Other | Admitting: Nurse Practitioner

## 2021-12-17 ENCOUNTER — Emergency Department: Payer: Medicare Other

## 2021-12-17 ENCOUNTER — Other Ambulatory Visit: Payer: Self-pay

## 2021-12-17 ENCOUNTER — Inpatient Hospital Stay
Admission: EM | Admit: 2021-12-17 | Discharge: 2021-12-19 | DRG: 065 | Disposition: A | Discharge status: Home / Self Care | Payer: Medicare Other | Attending: Internal Medicine | Admitting: Internal Medicine

## 2021-12-17 DIAGNOSIS — Z862 Personal history of diseases of the blood and blood-forming organs and certain disorders involving the immune mechanism: Secondary | ICD-10-CM

## 2021-12-17 DIAGNOSIS — I252 Old myocardial infarction: Secondary | ICD-10-CM

## 2021-12-17 DIAGNOSIS — Z885 Allergy status to narcotic agent status: Secondary | ICD-10-CM

## 2021-12-17 DIAGNOSIS — Z79890 Hormone replacement therapy (postmenopausal): Secondary | ICD-10-CM

## 2021-12-17 DIAGNOSIS — Z833 Family history of diabetes mellitus: Secondary | ICD-10-CM

## 2021-12-17 DIAGNOSIS — E785 Hyperlipidemia, unspecified: Secondary | ICD-10-CM | POA: Diagnosis present

## 2021-12-17 DIAGNOSIS — N390 Urinary tract infection, site not specified: Secondary | ICD-10-CM | POA: Diagnosis present

## 2021-12-17 DIAGNOSIS — N179 Acute kidney failure, unspecified: Secondary | ICD-10-CM | POA: Diagnosis present

## 2021-12-17 DIAGNOSIS — I6389 Other cerebral infarction: Principal | ICD-10-CM | POA: Diagnosis present

## 2021-12-17 DIAGNOSIS — Z9049 Acquired absence of other specified parts of digestive tract: Secondary | ICD-10-CM

## 2021-12-17 DIAGNOSIS — Z8249 Family history of ischemic heart disease and other diseases of the circulatory system: Secondary | ICD-10-CM

## 2021-12-17 DIAGNOSIS — E1151 Type 2 diabetes mellitus with diabetic peripheral angiopathy without gangrene: Secondary | ICD-10-CM | POA: Diagnosis present

## 2021-12-17 DIAGNOSIS — Z9884 Bariatric surgery status: Secondary | ICD-10-CM

## 2021-12-17 DIAGNOSIS — N184 Chronic kidney disease, stage 4 (severe): Secondary | ICD-10-CM | POA: Diagnosis present

## 2021-12-17 DIAGNOSIS — I639 Cerebral infarction, unspecified: Secondary | ICD-10-CM | POA: Diagnosis present

## 2021-12-17 DIAGNOSIS — E1122 Type 2 diabetes mellitus with diabetic chronic kidney disease: Secondary | ICD-10-CM | POA: Diagnosis present

## 2021-12-17 DIAGNOSIS — I25810 Atherosclerosis of coronary artery bypass graft(s) without angina pectoris: Secondary | ICD-10-CM | POA: Diagnosis present

## 2021-12-17 DIAGNOSIS — Z951 Presence of aortocoronary bypass graft: Secondary | ICD-10-CM

## 2021-12-17 DIAGNOSIS — Z7982 Long term (current) use of aspirin: Secondary | ICD-10-CM

## 2021-12-17 DIAGNOSIS — Z7984 Long term (current) use of oral hypoglycemic drugs: Secondary | ICD-10-CM

## 2021-12-17 DIAGNOSIS — I2510 Atherosclerotic heart disease of native coronary artery without angina pectoris: Secondary | ICD-10-CM | POA: Diagnosis present

## 2021-12-17 DIAGNOSIS — G4730 Sleep apnea, unspecified: Secondary | ICD-10-CM | POA: Diagnosis present

## 2021-12-17 DIAGNOSIS — I130 Hypertensive heart and chronic kidney disease with heart failure and stage 1 through stage 4 chronic kidney disease, or unspecified chronic kidney disease: Secondary | ICD-10-CM | POA: Diagnosis present

## 2021-12-17 DIAGNOSIS — I5022 Chronic systolic (congestive) heart failure: Secondary | ICD-10-CM | POA: Diagnosis present

## 2021-12-17 DIAGNOSIS — I440 Atrioventricular block, first degree: Secondary | ICD-10-CM | POA: Diagnosis present

## 2021-12-17 DIAGNOSIS — E8722 Chronic metabolic acidosis: Secondary | ICD-10-CM | POA: Diagnosis present

## 2021-12-17 DIAGNOSIS — E039 Hypothyroidism, unspecified: Secondary | ICD-10-CM | POA: Diagnosis present

## 2021-12-17 DIAGNOSIS — I69331 Monoplegia of upper limb following cerebral infarction affecting right dominant side: Secondary | ICD-10-CM

## 2021-12-17 DIAGNOSIS — R202 Paresthesia of skin: Secondary | ICD-10-CM

## 2021-12-17 DIAGNOSIS — R29701 NIHSS score 1: Secondary | ICD-10-CM | POA: Diagnosis present

## 2021-12-17 DIAGNOSIS — I447 Left bundle-branch block, unspecified: Secondary | ICD-10-CM | POA: Diagnosis present

## 2021-12-17 DIAGNOSIS — E063 Autoimmune thyroiditis: Secondary | ICD-10-CM | POA: Diagnosis present

## 2021-12-17 DIAGNOSIS — N189 Chronic kidney disease, unspecified: Secondary | ICD-10-CM

## 2021-12-17 DIAGNOSIS — Z79899 Other long term (current) drug therapy: Secondary | ICD-10-CM

## 2021-12-17 DIAGNOSIS — Z90710 Acquired absence of both cervix and uterus: Secondary | ICD-10-CM

## 2021-12-17 DIAGNOSIS — I739 Peripheral vascular disease, unspecified: Secondary | ICD-10-CM | POA: Diagnosis present

## 2021-12-17 DIAGNOSIS — Z823 Family history of stroke: Secondary | ICD-10-CM

## 2021-12-17 DIAGNOSIS — D631 Anemia in chronic kidney disease: Secondary | ICD-10-CM | POA: Diagnosis present

## 2021-12-17 DIAGNOSIS — Z888 Allergy status to other drugs, medicaments and biological substances status: Secondary | ICD-10-CM

## 2021-12-17 DIAGNOSIS — Z96652 Presence of left artificial knee joint: Secondary | ICD-10-CM | POA: Diagnosis present

## 2021-12-17 DIAGNOSIS — R200 Anesthesia of skin: Secondary | ICD-10-CM | POA: Diagnosis present

## 2021-12-17 LAB — CBC WITH DIFFERENTIAL/PLATELET
Abs Immature Granulocytes: 0.02 10*3/uL (ref 0.00–0.07)
Basophils Absolute: 0 10*3/uL (ref 0.0–0.1)
Basophils Relative: 1 %
Eosinophils Absolute: 0.1 10*3/uL (ref 0.0–0.5)
Eosinophils Relative: 1 %
HCT: 29.6 % — ABNORMAL LOW (ref 36.0–46.0)
Hemoglobin: 8.6 g/dL — ABNORMAL LOW (ref 12.0–15.0)
Immature Granulocytes: 0 %
Lymphocytes Relative: 21 %
Lymphs Abs: 1.4 10*3/uL (ref 0.7–4.0)
MCH: 23.8 pg — ABNORMAL LOW (ref 26.0–34.0)
MCHC: 29.1 g/dL — ABNORMAL LOW (ref 30.0–36.0)
MCV: 81.8 fL (ref 80.0–100.0)
Monocytes Absolute: 0.5 10*3/uL (ref 0.1–1.0)
Monocytes Relative: 7 %
Neutro Abs: 4.6 10*3/uL (ref 1.7–7.7)
Neutrophils Relative %: 70 %
Platelets: 288 10*3/uL (ref 150–400)
RBC: 3.62 MIL/uL — ABNORMAL LOW (ref 3.87–5.11)
RDW: 17.2 % — ABNORMAL HIGH (ref 11.5–15.5)
WBC: 6.6 10*3/uL (ref 4.0–10.5)
nRBC: 0 % (ref 0.0–0.2)

## 2021-12-17 LAB — BASIC METABOLIC PANEL
Anion gap: 8 (ref 5–15)
BUN: 55 mg/dL — ABNORMAL HIGH (ref 8–23)
CO2: 18 mmol/L — ABNORMAL LOW (ref 22–32)
Calcium: 8.7 mg/dL — ABNORMAL LOW (ref 8.9–10.3)
Chloride: 115 mmol/L — ABNORMAL HIGH (ref 98–111)
Creatinine, Ser: 2.99 mg/dL — ABNORMAL HIGH (ref 0.44–1.00)
GFR, Estimated: 16 mL/min — ABNORMAL LOW (ref >60)
Glucose, Bld: 131 mg/dL — ABNORMAL HIGH (ref 70–99)
Potassium: 5 mmol/L (ref 3.5–5.1)
Sodium: 141 mmol/L (ref 135–145)

## 2021-12-17 LAB — PROTIME-INR
INR: 1.2 (ref 0.8–1.2)
Prothrombin Time: 14.9 seconds (ref 11.4–15.2)

## 2021-12-17 LAB — TROPONIN I (HIGH SENSITIVITY): Troponin I (High Sensitivity): 21 ng/L — ABNORMAL HIGH (ref <18)

## 2021-12-17 LAB — BRAIN NATRIURETIC PEPTIDE: B Natriuretic Peptide: 273.1 pg/mL — ABNORMAL HIGH (ref 0.0–100.0)

## 2021-12-17 MED ORDER — SODIUM CHLORIDE 0.9 % IV BOLUS
1000.0000 mL | Freq: Once | INTRAVENOUS | Status: AC
  Administered 2021-12-18: 1000 mL via INTRAVENOUS

## 2021-12-17 NOTE — Discharge Instructions (Addendum)
Your Lasix has been held. Resume benazepril on 7/06/14/2021 follow-up with Dr. Candiss Norse regarding  CKD and lab check next week

## 2021-12-17 NOTE — ED Triage Notes (Signed)
Pt to ED via POV from PCP. Pt reports she woke up this morning with left hand numbness. Pt states she went to bed around 10:30pm. Pt reports dizziness. Pt reports she took 6 baby ASA this morning after discovery of symptoms. Pt denies any visual changes or slurred speech.   Pt with hx stroke and CABG

## 2021-12-17 NOTE — ED Provider Triage Note (Addendum)
Emergency Medicine Provider Triage Evaluation Note  [name] , a 71 y.o. female  was evaluated in triage.  Pt complains of left arm and hand numbness which she woke up with. Went to bed at 1030pm without symptoms. Unsure symptoms onset. Reports that she is dropping things with that hand because she cant feel it. Feels like speech is normal. Has not noticed any changes with her leg. No vision changes  Review of Systems  Positive: Left arm numbness Negative: CP, ataxia, slurred speech  Physical Exam  BP (!) 150/86 (BP Location: Right Arm)   Pulse 80   Temp 98.2 F (36.8 C) (Oral)   Resp 18   Ht 5\' 6"  (1.676 m)   Wt 85.7 kg   SpO2 99%   BMI 30.51 kg/m  Gen:   Awake, alert, no distress   Resp:  Normal effort  MSK:   Moves extremities without difficulty  Other:  Neurological: GCS 15 alert and oriented x3 Normal speech, no expressive or receptive aphasia or dysarthria Cranial nerves II through XII intact Normal visual fields 5 out of 5 strength in all 4 extremities. Normal grip strength bilaterally No extremity drift Normal finger-to-nose testing, no limb or truncal ataxia Follows commands easily   Medical Decision Making  Medically screening exam initiated at 4:26 PM.  Appropriate orders placed.  [name] was informed that the remainder of the evaluation will be completed by another provider, this initial triage assessment does not replace that evaluation, and the importance of remaining in the ED until their evaluation is complete.  Not TNK candidate. No aphasia, or visual changes. No weakness on exam. Does not seem consistent with LVO , NIHSS <6      Marquette Old, PA-C 12/17/21 1636

## 2021-12-17 NOTE — ED Provider Notes (Addendum)
[facility] Provider Note    Event Date/Time   First MD Initiated Contact with Patient 12/17/21 2234     (approximate)   History   Numbness (Left hand )   HPI  [name] is a 71 y.o. female with prior stroke, CABG patient comes in with left hand numbness that she woke up with.  Her last known normal was at 10:30 PM.  She is also having some dizziness.  She took 6 baby aspirin this morning.  Patient reports that she was previously on Eliquis but had to stop taking it 2 years ago.  She reports that she feels like her left whole arm is tingly and feels different.  She denies any other speech changes or other concerning stroke symptoms.  She does report a lot of stress in her life with a friend who just recently died of a heart attack.  She denies any chest pain, shortness of breath, abdominal pain or any other concerns.  Physical Exam   Triage Vital Signs: ED Triage Vitals  Enc Vitals Group     BP 12/17/21 1622 (!) 150/86     Pulse Rate 12/17/21 1622 80     Resp 12/17/21 1622 18     Temp 12/17/21 1622 98.2 F (36.8 C)     Temp Source 12/17/21 1622 Oral     SpO2 12/17/21 1622 99 %     Weight 12/17/21 1619 189 lb (85.7 kg)     Height 12/17/21 1619 5\' 6"  (1.676 m)     Head Circumference --      Peak Flow --      Pain Score 12/17/21 1619 0     Pain Loc --      Pain Edu? --      Excl. in [place]? --     Most recent vital signs: Vitals:   12/17/21 1622  BP: (!) 150/86  Pulse: 80  Resp: 18  Temp: 98.2 F (36.8 C)  SpO2: 99%     General: Awake, no distress.  CV:  Good peripheral perfusion.  Resp:  Normal effort.  Abd:  No distention.  Other:  Patient has some tingling in the left arm good distal pulse no swelling of the arm noted.  She is able to range it fully.  She is got good radial, ulnar, median nerve function.  Able to flex and extend the wrist.  No obvious pronator drift.   ED Results / Procedures / Treatments   Labs (all labs ordered  are listed, but only abnormal results are displayed) Labs Reviewed  CBC WITH DIFFERENTIAL/PLATELET - Abnormal; Notable for the following components:      Result Value   RBC 3.62 (*)    Hemoglobin 8.6 (*)    HCT 29.6 (*)    MCH 23.8 (*)    MCHC 29.1 (*)    RDW 17.2 (*)    All other components within normal limits  BRAIN NATRIURETIC PEPTIDE - Abnormal; Notable for the following components:   B Natriuretic Peptide 273.1 (*)    All other components within normal limits  TROPONIN I (HIGH SENSITIVITY) - Abnormal; Notable for the following components:   Troponin I (High Sensitivity) 21 (*)    All other components within normal limits  PROTIME-INR  BASIC METABOLIC PANEL  TROPONIN I (HIGH SENSITIVITY)     EKG  My interpretation of EKG:  Sinus rate of 62 without any ST elevation but has a type I AV block with  left bundle branch block.  Similar to prior  RADIOLOGY I have reviewed the CT head personally interpreted no evidence of intercranial hemorrhage   PROCEDURES:  Critical Care performed: No  .1-3 Lead EKG Interpretation  Performed by: Vanessa [place], MD Authorized by: Vanessa [place], MD     Interpretation: normal     ECG rate:  60   ECG rate assessment: normal     Rhythm: sinus rhythm     Ectopy: none     Conduction: abnormal     Abnormal conduction: 1st degree AV block      MEDICATIONS ORDERED IN ED: Medications - No data to display   IMPRESSION / MDM / [place] / ED COURSE  I reviewed the triage vital signs and the nursing notes.   Patient's presentation is most consistent with acute presentation with potential threat to life or bodily function.   Differential includes stroke, ACS, [place] abnormalities.  Patient has no evidence of acute limb ischemia on examination.  No swelling of the arm to suggest DVT.  CT head from triage was negative.  Patient out of the window for stroke code for TNK and no evidence of LVO based upon examination.  Think that  this could be some stress reaction and I am not seeing any other neurodeficits other than some subjective sensation changes  Ct head negative Hemoglobin stable  Coags normal Trop 21   Troponin was slightly elevated but she denies any chest pain or shortness of breath.  Will get repeat troponin and MRI to further evaluate.  Patient handed off to oncoming team pending these  The kidney function was elevated.  Her baseline is 1.8.  Recommended admission for AKI fluid hydration and to monitor this but patient is declining admission states she really wants to go home.  Given no evidence of hyperkalemia and BUN is only 55 we will hydrate patient with a liter and recheck a BMP as well as get the MRI.  Patient would like to go home if this is looking better.  She can follow-up with her renal doctor  The patient is on the cardiac monitor to evaluate for evidence of arrhythmia and/or significant heart rate changes.      FINAL CLINICAL IMPRESSION(S) / ED DIAGNOSES   Final diagnoses:  Numbness and tingling in left arm     Rx / DC Orders   ED Discharge Orders     None        Note:  This document was prepared using Dragon voice recognition software and may include unintentional dictation errors.   Vanessa , MD 12/17/21 2325    Vanessa , MD 12/17/21 [phone]

## 2021-12-18 ENCOUNTER — Inpatient Hospital Stay: Payer: Medicare Other

## 2021-12-18 ENCOUNTER — Emergency Department: Payer: Medicare Other

## 2021-12-18 DIAGNOSIS — N390 Urinary tract infection, site not specified: Secondary | ICD-10-CM | POA: Diagnosis present

## 2021-12-18 DIAGNOSIS — E063 Autoimmune thyroiditis: Secondary | ICD-10-CM | POA: Diagnosis present

## 2021-12-18 DIAGNOSIS — E1122 Type 2 diabetes mellitus with diabetic chronic kidney disease: Secondary | ICD-10-CM | POA: Diagnosis present

## 2021-12-18 DIAGNOSIS — Z951 Presence of aortocoronary bypass graft: Secondary | ICD-10-CM | POA: Diagnosis not present

## 2021-12-18 DIAGNOSIS — E8722 Chronic metabolic acidosis: Secondary | ICD-10-CM | POA: Diagnosis present

## 2021-12-18 DIAGNOSIS — Z833 Family history of diabetes mellitus: Secondary | ICD-10-CM | POA: Diagnosis not present

## 2021-12-18 DIAGNOSIS — Z862 Personal history of diseases of the blood and blood-forming organs and certain disorders involving the immune mechanism: Secondary | ICD-10-CM

## 2021-12-18 DIAGNOSIS — Z8249 Family history of ischemic heart disease and other diseases of the circulatory system: Secondary | ICD-10-CM | POA: Diagnosis not present

## 2021-12-18 DIAGNOSIS — I6389 Other cerebral infarction: Secondary | ICD-10-CM | POA: Diagnosis present

## 2021-12-18 DIAGNOSIS — Z823 Family history of stroke: Secondary | ICD-10-CM | POA: Diagnosis not present

## 2021-12-18 DIAGNOSIS — I440 Atrioventricular block, first degree: Secondary | ICD-10-CM | POA: Diagnosis present

## 2021-12-18 DIAGNOSIS — I130 Hypertensive heart and chronic kidney disease with heart failure and stage 1 through stage 4 chronic kidney disease, or unspecified chronic kidney disease: Secondary | ICD-10-CM | POA: Diagnosis present

## 2021-12-18 DIAGNOSIS — D631 Anemia in chronic kidney disease: Secondary | ICD-10-CM | POA: Diagnosis present

## 2021-12-18 DIAGNOSIS — I639 Cerebral infarction, unspecified: Secondary | ICD-10-CM

## 2021-12-18 DIAGNOSIS — Z79899 Other long term (current) drug therapy: Secondary | ICD-10-CM | POA: Diagnosis not present

## 2021-12-18 DIAGNOSIS — Z7982 Long term (current) use of aspirin: Secondary | ICD-10-CM | POA: Diagnosis not present

## 2021-12-18 DIAGNOSIS — N184 Chronic kidney disease, stage 4 (severe): Secondary | ICD-10-CM | POA: Diagnosis present

## 2021-12-18 DIAGNOSIS — R29701 NIHSS score 1: Secondary | ICD-10-CM | POA: Diagnosis present

## 2021-12-18 DIAGNOSIS — E1151 Type 2 diabetes mellitus with diabetic peripheral angiopathy without gangrene: Secondary | ICD-10-CM | POA: Diagnosis present

## 2021-12-18 DIAGNOSIS — Z7984 Long term (current) use of oral hypoglycemic drugs: Secondary | ICD-10-CM | POA: Diagnosis not present

## 2021-12-18 DIAGNOSIS — I5022 Chronic systolic (congestive) heart failure: Secondary | ICD-10-CM | POA: Diagnosis present

## 2021-12-18 DIAGNOSIS — N189 Chronic kidney disease, unspecified: Secondary | ICD-10-CM

## 2021-12-18 DIAGNOSIS — N179 Acute kidney failure, unspecified: Secondary | ICD-10-CM | POA: Diagnosis present

## 2021-12-18 DIAGNOSIS — Z79890 Hormone replacement therapy (postmenopausal): Secondary | ICD-10-CM | POA: Diagnosis not present

## 2021-12-18 DIAGNOSIS — I447 Left bundle-branch block, unspecified: Secondary | ICD-10-CM | POA: Diagnosis present

## 2021-12-18 DIAGNOSIS — I2510 Atherosclerotic heart disease of native coronary artery without angina pectoris: Secondary | ICD-10-CM | POA: Diagnosis present

## 2021-12-18 DIAGNOSIS — R200 Anesthesia of skin: Secondary | ICD-10-CM | POA: Diagnosis present

## 2021-12-18 HISTORY — DX: Chronic kidney disease, unspecified: N18.9

## 2021-12-18 HISTORY — DX: Cerebral infarction, unspecified: I63.9

## 2021-12-18 HISTORY — DX: Personal history of diseases of the blood and blood-forming organs and certain disorders involving the immune mechanism: Z86.2

## 2021-12-18 LAB — URINALYSIS, ROUTINE W REFLEX MICROSCOPIC
Bilirubin Urine: NEGATIVE
Glucose, UA: NEGATIVE mg/dL
Hgb urine dipstick: NEGATIVE
Ketones, ur: NEGATIVE mg/dL
Nitrite: NEGATIVE
Protein, ur: 30 mg/dL — AB
Specific Gravity, Urine: 1.012 (ref 1.005–1.030)
WBC, UA: >50 WBC/hpf — ABNORMAL HIGH (ref 0–5)
pH: 5 (ref 5.0–8.0)

## 2021-12-18 LAB — GLUCOSE, CAPILLARY
Glucose-Capillary: 100 mg/dL — ABNORMAL HIGH (ref 70–99)
Glucose-Capillary: 133 mg/dL — ABNORMAL HIGH (ref 70–99)
Glucose-Capillary: 138 mg/dL — ABNORMAL HIGH (ref 70–99)

## 2021-12-18 LAB — TROPONIN I (HIGH SENSITIVITY): Troponin I (High Sensitivity): 35 ng/L — ABNORMAL HIGH (ref <18)

## 2021-12-18 LAB — BASIC METABOLIC PANEL
Anion gap: 7 (ref 5–15)
BUN: 52 mg/dL — ABNORMAL HIGH (ref 8–23)
CO2: 18 mmol/L — ABNORMAL LOW (ref 22–32)
Calcium: 8.3 mg/dL — ABNORMAL LOW (ref 8.9–10.3)
Chloride: 117 mmol/L — ABNORMAL HIGH (ref 98–111)
Creatinine, Ser: 2.75 mg/dL — ABNORMAL HIGH (ref 0.44–1.00)
GFR, Estimated: 18 mL/min — ABNORMAL LOW (ref >60)
Glucose, Bld: 88 mg/dL (ref 70–99)
Potassium: 4.4 mmol/L (ref 3.5–5.1)
Sodium: 142 mmol/L (ref 135–145)

## 2021-12-18 MED ORDER — MELATONIN 5 MG PO TABS: 10.0000 mg | ORAL_TABLET | Freq: Every evening | ORAL | Status: DC | PRN

## 2021-12-18 MED ORDER — INSULIN ASPART 100 UNIT/ML IJ SOLN
0.0000 [IU] | INTRAMUSCULAR | Status: DC
  Administered 2021-12-18 (×2): 2 [IU] via SUBCUTANEOUS
  Filled 2021-12-18: qty 1

## 2021-12-18 MED ORDER — STERILE WATER FOR INJECTION IV SOLN
INTRAVENOUS | Status: AC
  Filled 2021-12-18: qty 150

## 2021-12-18 MED ORDER — LEVOTHYROXINE SODIUM 175 MCG PO TABS
175.0000 ug | ORAL_TABLET | ORAL | Status: DC
  Administered 2021-12-19: 175 ug via ORAL
  Filled 2021-12-18: qty 1

## 2021-12-18 MED ORDER — ACETAMINOPHEN 160 MG/5ML PO SOLN: 650.0000 mg | ORAL | Status: DC | PRN

## 2021-12-18 MED ORDER — STROKE: EARLY STAGES OF RECOVERY BOOK: Freq: Once | Status: AC

## 2021-12-18 MED ORDER — SODIUM CHLORIDE 0.9 % IV SOLN
1.0000 g | INTRAVENOUS | Status: DC
  Administered 2021-12-19: 1 g via INTRAVENOUS
  Filled 2021-12-18: qty 10

## 2021-12-18 MED ORDER — VITAMIN B-12 1000 MCG PO TABS
1000.0000 ug | ORAL_TABLET | Freq: Every day | ORAL | Status: DC
  Administered 2021-12-18 – 2021-12-19 (×2): 1000 ug via ORAL
  Filled 2021-12-18 (×3): qty 1

## 2021-12-18 MED ORDER — SODIUM CHLORIDE 0.9 % IV SOLN
1.0000 g | Freq: Once | INTRAVENOUS | Status: AC
  Administered 2021-12-18: 1 g via INTRAVENOUS
  Filled 2021-12-18: qty 10

## 2021-12-18 MED ORDER — ACETAMINOPHEN 325 MG PO TABS
650.0000 mg | ORAL_TABLET | ORAL | Status: DC | PRN
  Administered 2021-12-19: 650 mg via ORAL
  Filled 2021-12-18 (×2): qty 2

## 2021-12-18 MED ORDER — PRAVASTATIN SODIUM 20 MG PO TABS: 40.0000 mg | ORAL_TABLET | Freq: Every day | ORAL | Status: DC

## 2021-12-18 MED ORDER — CLOPIDOGREL BISULFATE 75 MG PO TABS
75.0000 mg | ORAL_TABLET | Freq: Every day | ORAL | Status: DC
  Administered 2021-12-18 – 2021-12-19 (×2): 75 mg via ORAL
  Filled 2021-12-18 (×2): qty 1

## 2021-12-18 MED ORDER — ASPIRIN 81 MG PO TBEC
81.0000 mg | DELAYED_RELEASE_TABLET | Freq: Every day | ORAL | Status: DC
  Administered 2021-12-18 – 2021-12-19 (×2): 81 mg via ORAL
  Filled 2021-12-18 (×2): qty 1

## 2021-12-18 MED ORDER — FERROUS SULFATE 325 (65 FE) MG PO TABS
325.0000 mg | ORAL_TABLET | ORAL | Status: DC
  Administered 2021-12-18: 325 mg via ORAL
  Filled 2021-12-18: qty 1

## 2021-12-18 MED ORDER — PANTOPRAZOLE SODIUM 20 MG PO TBEC
20.0000 mg | DELAYED_RELEASE_TABLET | Freq: Every day | ORAL | Status: DC
  Administered 2021-12-18 – 2021-12-19 (×2): 20 mg via ORAL
  Filled 2021-12-18 (×2): qty 1

## 2021-12-18 MED ORDER — ACETAMINOPHEN 650 MG RE SUPP: 650.0000 mg | RECTAL | Status: DC | PRN

## 2021-12-18 NOTE — Progress Notes (Signed)
OT Cancellation Note  Patient Details Name: [name] MRN: [id number] DOB: 10/19/1950   Cancelled Treatment:    Reason Eval/Treat Not Completed: OT screened, no needs identified, will sign off. OT orders received, chart reviewed. Upon arrival to pt room, pt standing independently at sink performing oral care. Hospital transport team arrives simultaneously to take pt to [place]. Pt performs standing UB dressing (to don hospital gown), and transfers to transport WC independently. Pt reporting symptoms of LUE weakness/numbness have largely resolved. No skilled OT needs identified. Will sign off at this time. Please re-consult if additional OT needs arise during this hospital stay.   Shara Blazing, M.S., OTR/L Ascom: [phone] 12/18/21, 11:24 AM

## 2021-12-18 NOTE — ED Provider Notes (Signed)
-----------------------------------------   2:00 AM on 12/18/2021 -----------------------------------------   Updated patient and spouse of MRI concerning for acute infarct.  Will administer IV Rocephin for UTI.  Will consult hospitalist services for evaluation and admission.   Paulette Blanch, MD 12/18/21 0201

## 2021-12-18 NOTE — Assessment & Plan Note (Signed)
Stable Continue Synthroid 

## 2021-12-18 NOTE — Assessment & Plan Note (Signed)
Stable Continue aspirin, Plavix and statins

## 2021-12-18 NOTE — Assessment & Plan Note (Signed)
Patient has a history of type 2 diabetes mellitus with complications of stage IV chronic kidney disease and a baseline serum creatinine of 1.8. Hold glimepiride Place patient on consistent carbohydrate diet Check blood sugars before meals and at bedtime Will need referral to nephrology as an outpatient

## 2021-12-18 NOTE — Assessment & Plan Note (Signed)
Patient with a prior history of CVA with residual right arm weakness who presents to the ER for evaluation of left arm numbness that was present when she awoke on 12/17/21 associated with a headache. Symptoms have improved but MRI is concerning for an acute infarct. Allow for permissive hypertension Consult neurology Continue aspirin, Plavix and statins We will request PT/OT/ST consult Obtain 2D echocardiogram to assess LVEF and rule out cardiac thrombus

## 2021-12-18 NOTE — H&P (Signed)
History and Physical    Patient: [name] JTT:[id number] DOB: Dec 18, 1950 DOA: 12/17/2021 DOS: the patient was seen and examined on 12/18/2021 PCP: Kirk Ruths, MD  Patient coming from: Home  Chief Complaint:  Chief Complaint  Patient presents with   Numbness    Left hand    HPI: [name] is a 71 y.o. female with medical history significant for diabetes mellitus with complications of stage IV chronic kidney disease, history of CVA with right-sided weakness, coronary artery disease, hypothyroidism, coronary artery disease status post CABG who presents to the ER for evaluation of left hand numbness. Patient states that she woke up 1 day prior to her admission with left hand numbness.  Last known well was around 10:30 PM on 12/16/21.  She states that she kept dropping things throughout the course of the day and then developed a headache which was concerning and prompted her visit to the ER. She denies having any visual changes, no slurred speech, no difficulty swallowing, no focal deficit, no chest pain, no shortness of breath, no nausea, no vomiting, no changes in her bowel habits, no leg swelling. She complains of frequency of urination associated with dysuria and suprapubic pain which she rated a 5 x 10 in intensity at its worst. Labs show worsening of her renal function and on admission her serum creatinine is 2.9 compared to baseline of 1.8 from 2 months ago. She has pyuria and had a urine culture that yielded Klebsiella pneumonia from 12/06/21. She will be admitted to the hospital for further evaluation.    Review of Systems: As mentioned in the history of present illness. All other systems reviewed and are negative. Past Medical History:  Diagnosis Date   Anemia    Anemia, iron deficiency 11/07/2013   Arthritis    CAD (coronary artery disease)    Cerebrovascular accident [facility])    CHF (congestive heart failure) (HCC)    Chicken pox    Chronic kidney disease     Colitis    ? 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   Sleep apnea    Past Surgical History:  Procedure Laterality Date   ABDOMINAL HYSTERECTOMY     CHOLECYSTECTOMY     COLONOSCOPY WITH PROPOFOL N/A 12/24/2014   Procedure: COLONOSCOPY WITH PROPOFOL;  Surgeon: Hulen Luster, MD;  Location: [facility] ENDOSCOPY;  Service: Gastroenterology;  Laterality: N/A;   CORONARY ARTERY BYPASS GRAFT     ESOPHAGOGASTRODUODENOSCOPY N/A 09/02/2017   Procedure: ESOPHAGOGASTRODUODENOSCOPY (EGD);  Surgeon: Virgel Manifold, MD;  Location: [facility] ENDOSCOPY;  Service: Endoscopy;  Laterality: N/A;   ESOPHAGOGASTRODUODENOSCOPY (EGD) WITH PROPOFOL N/A 12/24/2014   Procedure: ESOPHAGOGASTRODUODENOSCOPY (EGD) WITH PROPOFOL;  Surgeon: Hulen Luster, MD;  Location: [facility] ENDOSCOPY;  Service: Gastroenterology;  Laterality: N/A;   JOINT REPLACEMENT     LOWER EXTREMITY ANGIOGRAPHY Left 07/17/2019   Procedure: Lower Extremity Angiography;  Surgeon: Algernon Huxley, MD;  Location: [facility];  Service: Cardiovascular;  Laterality: Left;   ROUX-EN-Y GASTRIC BYPASS     loss over 100 pounds   TONSILLECTOMY     TOTAL KNEE ARTHROPLASTY Left    Social History:  reports that she has never smoked. She has never used smokeless tobacco. She reports that  she does not drink alcohol and does not use drugs.  Allergies  Allergen Reactions   Niacin Other (See Comments)   Ultram [Tramadol Hcl]     Intolerance, makes woozy   Baclofen Other (See Comments)    Dizziness   Cardura [Doxazosin Mesylate] Other (See Comments)    Dizzy with passing out spells   Morphine And Related Nausea And Vomiting   Other Rash    Gold metal (nickel in the gold)    Family History  Problem Relation Age of Onset   Coronary artery disease Other     Stroke Maternal Grandmother    Diabetes Paternal Grandmother        diabetic coma   Heart attack Father    Heart attack Mother    Heart attack Maternal Grandfather    Heart attack Paternal Grandfather    Breast cancer Neg Hx     Prior to Admission medications   Medication Sig Start Date End Date Taking? Authorizing Provider  aspirin EC 81 MG tablet Take 81 mg by mouth daily.     [provider]  benazepril (LOTENSIN) 40 MG tablet TAKE ONE TABLET BY MOUTH ONCE DAILY Patient taking differently: Take 40 mg by mouth daily. 08/24/14   Susy Frizzle, MD  benzonatate (TESSALON) 200 MG capsule Take 1 capsule (200 mg total) by mouth every 8 (eight) hours. 12/26/19   Tasia Catchings, Amy V, PA-C  cloNIDine (CATAPRES) 0.1 MG tablet Take 0.1 mg by mouth 2 (two) times daily.    [provider]  clopidogrel (PLAVIX) 75 MG tablet Take 1 tablet (75 mg total) by mouth daily. 09/29/19   Kris Hartmann, NP  clotrimazole-betamethasone (LOTRISONE) cream Apply to affected area 2 times daily prn 12/26/19   Tasia Catchings, Amy V, PA-C  doxycycline (VIBRAMYCIN) 100 MG capsule Take 1 capsule (100 mg total) by mouth 2 (two) times daily. 12/26/19   Tasia Catchings, Amy V, PA-C  Ferrous Sulfate (SLOW FE) 142 (45 Fe) MG TBCR Take 1 tablet by mouth 2 (two) times a week.    [provider]  fluconazole (DIFLUCAN) 150 MG tablet Take one tab PO once, may repeat in 10 days if needed 03/27/21   Francene Finders, PA-C  furosemide (LASIX) 40 MG tablet Take 1 tablet (40 mg total) by mouth daily. Must keep appt or further refills 02/28/20   Darylene Price A, FNP  glimepiride (AMARYL) 1 MG tablet Take 1 tablet by mouth daily. 06/15/17   [provider]  hydrALAZINE (APRESOLINE) 25 MG tablet Take 1 tablet (25 mg total) by mouth 2 (two) times daily. 07/19/19   Fritzi Mandes, MD  levothyroxine (SYNTHROID) 137 MCG tablet Take 137 mcg by mouth daily before breakfast.    [provider]  lovastatin (MEVACOR) 20 MG tablet Take 2  tablets (40 mg total) by mouth at bedtime. 02/06/14   Susy Frizzle, MD  Melatonin 10 MG CAPS Take 30 mg by mouth.    [provider]  nitrofurantoin, macrocrystal-monohydrate, (MACROBID) 100 MG capsule Take 1 capsule (100 mg total) by mouth 2 (two) times daily. 03/27/21   Francene Finders, PA-C  pantoprazole (PROTONIX) 40 MG tablet Take 1 tablet (40 mg total) by mouth daily. 07/20/19   Fritzi Mandes, MD  potassium chloride (KLOR-CON) 10 MEQ tablet Take 2 tablets (20 mEq total) by mouth daily. 07/03/19   Alisa Graff, FNP  vitamin B-12 (CYANOCOBALAMIN) 1000 MCG tablet Take 1,000 mcg by mouth daily.    [provider]  Physical Exam: Vitals:   12/18/21 0400 12/18/21 0500 12/18/21 0700 12/18/21 0914  BP: (!) 155/61 (!) 122/55 (!) 116/45 131/65  Pulse:    68  Resp: 14 15 14 16   Temp:    98.8 F (37.1 C)  TempSrc:    Oral  SpO2:    98%  Weight:      Height:       Physical Exam Vitals and nursing note reviewed.  Constitutional:      Appearance: Normal appearance.  HENT:     Head: Normocephalic and atraumatic.     Nose: Nose normal.     Mouth/Throat:     Mouth: Mucous membranes are moist.  Eyes:     Comments: Pale conjunctiva  Cardiovascular:     Rate and Rhythm: Normal rate.  Pulmonary:     Effort: Pulmonary effort is normal.     Breath sounds: Normal breath sounds.  Abdominal:     General: Abdomen is flat. Bowel sounds are normal.     Palpations: Abdomen is soft.     Comments: Suprapubic tenderness  Musculoskeletal:        General: Normal range of motion.     Cervical back: Normal range of motion and neck supple.  Skin:    General: Skin is warm and dry.  Neurological:     General: No focal deficit present.     Mental Status: She is alert and oriented to person, place, and time.     Comments: Able to move all extremities  Psychiatric:        Mood and Affect: Mood normal.        Behavior: Behavior normal.     Data Reviewed: Relevant notes from  primary care and specialist visits, past discharge summaries as available in EHR, including Care Everywhere. Prior diagnostic testing as pertinent to current admission diagnoses Updated medications and problem lists for reconciliation ED course, including vitals, labs, imaging, treatment and response to treatment Triage notes, nursing and pharmacy notes and ED provider's notes Notable results as noted in HPI Labs reviewed.  Sodium 142, potassium 4.4, chloride 117, bicarb 18, glucose 88, BUN 55, creatinine 2.99, calcium 8.7, troponin 21 >> 35, PT 14.9, INR 1.2, white count 6.6, hemoglobin 8.6, hematocrit 29.6, platelet count 288 Urine analysis shows pyuria CT scan of the head without contrast shows no acute finding MRI of the brain without contrast shows subtle 1.8 cm linear focus of diffusion signal abnormality involving the cortical/subcortical right frontal lobe as above. While this finding could potentially be artifactual in nature, a possible acute ischemic infarct is difficult to exclude, particularly given the provided history. No associated hemorrhage or mass effect. No other acute intracranial abnormality. Age-related cerebral atrophy with mild chronic small vessel ischemic disease. Few small remote cerebellar infarcts. Twelve-lead EKG by me shows sinus rhythm with first-degree AV block There are no new results to review at this time.  Assessment and Plan: * Acute CVA (cerebrovascular accident) [facility]) Patient with a prior history of CVA with residual right arm weakness who presents to the ER for evaluation of left arm numbness that was present when she awoke on 12/17/21 associated with a headache. Symptoms have improved but MRI is concerning for an acute infarct. Allow for permissive hypertension Consult neurology Continue aspirin, Plavix and statins We will request PT/OT/ST consult Obtain 2D echocardiogram to assess LVEF and rule out cardiac thrombus  AKI (acute kidney injury)  ([place]) Patient has a history of stage IV chronic kidney disease secondary  to diabetes mellitus with a baseline serum creatinine of 1.8. Today on admission her serum creatinine is 2.9 with a normal anion gap metabolic acidosis and elevated BUN We will hydrate patient with bicarb infusion Obtain renal ultrasound to rule out obstructive uropathy Hold furosemide and benazepril for now Consult nephrology  CKD stage 4 due to type 2 diabetes mellitus ([place]) Patient has a history of type 2 diabetes mellitus with complications of stage IV chronic kidney disease and a baseline serum creatinine of 1.8. Hold glimepiride Place patient on consistent carbohydrate diet Check blood sugars before meals and at bedtime Will need referral to nephrology as an outpatient  UTI (urinary tract infection) Patient has pyuria and suprapubic pain. Prior urine culture from 12/06/21 yielded Klebsiella pneumonia sensitive to cephalosporins We will treat patient empirically with Rocephin 1 g IV daily   PAD (peripheral artery disease) (HCC) Stable Continue aspirin, Plavix and statins  CAD, ARTERY BYPASS GRAFT Stable Continue aspirin, Plavix and statins  History of anemia due to chronic kidney disease H&H is stable Monitor closely during this hospitalization  Hypothyroidism Stable Continue Synthroid      Advance Care Planning:   Code Status: Full Code   Consults: Nephrology, Neurology  Family Communication: Greater than 50% of time was spent discussing patient's condition and plan of care with her at the bedside.  All questions and concerns have been addressed.  She verbalizes understanding and agrees with the plan.  Severity of Illness: The appropriate patient status for this patient is INPATIENT. Inpatient status is judged to be reasonable and necessary in order to provide the required intensity of service to ensure the patient's safety. The patient's presenting symptoms, physical exam findings, and  initial radiographic and laboratory data in the context of their chronic comorbidities is felt to place them at high risk for further clinical deterioration. Furthermore, it is not anticipated that the patient will be medically stable for discharge from the hospital within 2 midnights of admission.   * I certify that at the point of admission it is my clinical judgment that the patient will require inpatient hospital care spanning beyond 2 midnights from the point of admission due to high intensity of service, high risk for further deterioration and high frequency of surveillance required.*  Author: Collier Bullock, MD 12/18/2021 10:25 AM  For on call review [email or web address].

## 2021-12-18 NOTE — Evaluation (Signed)
Speech Language Pathology Evaluation Patient Details Name: TODD ARGABRIGHT MRN: [id number] DOB: [birth year] Today's Date: 12/18/2021 Time: 1010-1050 SLP Time Calculation (min) (ACUTE ONLY): 40 min  Problem List:  Patient Active Problem List   Diagnosis Date Noted   Acute CVA (cerebrovascular accident) ([place]) 12/18/2021   AKI (acute kidney injury) ([place]) 12/18/2021   CKD stage 4 due to type 2 diabetes mellitus ([place]) 12/18/2021   History of anemia due to chronic kidney disease 12/18/2021   UTI (urinary tract infection)    Acute on chronic systolic CHF (congestive heart failure), NYHA class 3 ([place]) 08/03/2019   Type 2 diabetes mellitus with diabetic peripheral angiopathy without gangrene, without long-term current use of insulin (HCC)    Ischemic pain of left foot    PAD (peripheral artery disease) ([place])    Ischemic foot 07/15/2019   H/O: stroke 07/15/2019   Healthcare maintenance 11/30/2018   Amaurosis fugax of right eye 12/14/2017   Bilateral leg edema 09/28/2017   Hyperlipidemia, mixed 09/28/2017   Lesion of esophagus    Acute gastric ulcer without hemorrhage or perforation    Anemia 09/01/2017   DOE (dyspnea on exertion) 09/01/2017   Chronic insomnia 01/26/2017   Postmenopausal 08/05/2016   Low TSH level 01/12/2016   Coronary artery disease with hx of myocardial infarct w/o hx of CABG 05/21/2015   Chronic kidney disease, stage III (moderate) ([place]) 10/16/2014   Hypothyroidism 08/06/2014   Type 2 diabetes mellitus without complication ([place]) 67/05/4579   Anemia, iron deficiency 11/07/2013   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   Sleep apnea    Past Surgical History:  Past Surgical History:  Procedure Laterality Date   ABDOMINAL HYSTERECTOMY     CHOLECYSTECTOMY     COLONOSCOPY WITH PROPOFOL N/A 12/24/2014   Procedure: COLONOSCOPY WITH PROPOFOL;  Surgeon: Hulen Luster, MD;  Location: [facility] ENDOSCOPY;  Service: Gastroenterology;  Laterality: N/A;   CORONARY ARTERY BYPASS GRAFT     ESOPHAGOGASTRODUODENOSCOPY N/A 09/02/2017   Procedure: ESOPHAGOGASTRODUODENOSCOPY (EGD);  Surgeon: Virgel Manifold, MD;  Location: [facility] ENDOSCOPY;  Service: Endoscopy;  Laterality: N/A;   ESOPHAGOGASTRODUODENOSCOPY (EGD) WITH PROPOFOL N/A 12/24/2014   Procedure: ESOPHAGOGASTRODUODENOSCOPY (EGD) WITH PROPOFOL;  Surgeon: Hulen Luster, MD;  Location: [facility] ENDOSCOPY;  Service: Gastroenterology;  Laterality: N/A;   JOINT REPLACEMENT     LOWER EXTREMITY ANGIOGRAPHY Left 07/17/2019   Procedure: Lower Extremity Angiography;  Surgeon: Algernon Huxley, MD;  Location: [facility];  Service: Cardiovascular;  Laterality: Left;   ROUX-EN-Y GASTRIC BYPASS     loss over 100 pounds   TONSILLECTOMY     TOTAL KNEE ARTHROPLASTY Left    HPI:  Per H&P "  MIAISABELLA BACORN is a 71 y.o. female with medical history significant for diabetes mellitus with complications of stage IV chronic kidney disease, history of CVA with right-sided weakness, coronary artery disease, hypothyroidism, coronary artery disease status post CABG who presents to the ER for evaluation of left hand numbness.  Patient states that she woke up 1 day prior to her admission with left hand numbness.  Last known well was around 10:30 PM on  12/16/21.  She states that she kept dropping things throughout the course of the day and then developed a headache which was concerning and prompted her visit to the ER.  She denies having any visual changes, no slurred speech, no difficulty swallowing, no focal deficit, no chest pain, no shortness of breath, no nausea, no vomiting, no changes in her bowel habits, no leg swelling.  She complains of frequency of urination associated with dysuria and suprapubic pain which she rated a 5 x 10 in intensity at its worst.  Labs show worsening of her renal function and on admission her serum creatinine is 2.9 compared to baseline of 1.8 from 2 months ago.  She has pyuria and had a urine culture that yielded Klebsiella pneumonia from 12/06/21.  She will be admitted to the hospital for further evaluation."   MRI 12/18/21 "1. Subtle 1.8 cm linear focus of diffusion signal abnormality involving the cortical/subcortical right frontal lobe as above. While this finding could potentially be artifactual in nature, a possible acute ischemic infarct is difficult to exclude, particularly given the provided history. Correlation with physical exam and any potential symptomatology recommended. No associated hemorrhage or mass effect. 2. No other acute intracranial abnormality. 3. Age-related cerebral atrophy with mild chronic small vessel ischemic disease. Few small remote cerebellar infarcts."  Assessment / Plan / Recommendation Clinical Impression  Pt seen for cognitive-linguistic evaluation. Pt alert, pleasant, and cooperative. Endorsed seldom anomia residual from previous stroke ("around 15 years ago"). Cleared with RN.   Evaluation completed via informal means and portions of Cognistat. Based on today's assessment, pt appears to be at baseline level of functioning. Pt with x1 instance of anomia during conversational speech with independent repair of communication breakdown. Pt able to ID strategies of "extra time" and  "describing it" (circumlocution) as effective in repairing communication breakdowns. Pt demonstrated intact functional auditory comprehension and verbal expression as well as intact attention (sutained, visual), memory (immediate, short term, working), problem solving/judgement (verbal), reasoning (abstract categorizations), and insight.   No f/u SLP services recommended at this time as pt likely as baseline level of functioning. SLP to sign off.   Pt and RN made aware of results, recommendations, and SLP POC. Pt verbalized understanding/agreement.    SLP Assessment  SLP Recommendation/Assessment: Patient does not need any further Speech [place] Pathology Services SLP Visit Diagnosis: Cognitive communication deficit (R41.841)    Recommendations for follow up therapy are one component of a multi-disciplinary discharge planning process, led by the attending physician.  Recommendations may be updated based on patient status, additional functional criteria and insurance authorization.    Follow Up Recommendations  No SLP follow up    Assistance Recommended at Discharge  PRN  Functional Status Assessment Patient has not had a recent decline in their functional status  SLP Evaluation Cognition  Overall Cognitive Status: Within Functional Limits for tasks assessed Arousal/Alertness: Awake/alert Orientation Level: Oriented X4 Memory: Appears intact Awareness: Appears intact Problem Solving: Appears intact Executive Function: Reasoning;Sequencing Reasoning: Appears intact Sequencing: Appears intact  Comprehension  Auditory Comprehension Overall Auditory Comprehension: Appears within functional limits for tasks assessed Yes/No Questions: Within Functional Limits Commands: Within Functional Limits Conversation: Complex [facility]) Visual Recognition/Discrimination Discrimination: Within Function Limits Reading Comprehension Reading Status: Not tested    Expression Expression Primary  Mode of Expression: Verbal Verbal Expression Overall Verbal Expression: Appears within functional limits for tasks assessed Initiation: No impairment Automatic Speech:  (WFL) Level of Generative/Spontaneous Verbalization: Conversation (x1 instance of anomia with independent repair) Repetition: No impairment Naming: No impairment Pragmatics: No impairment Written Expression Dominant Hand: Right Written Expression: Not tested   Oral / Motor  Oral Motor/Sensory Function Overall Oral Motor/Sensory Function: Within functional limits Motor Speech Overall Motor Speech: Appears within functional limits for tasks assessed Respiration: Within functional limits Phonation: Normal Resonance: Within functional limits Articulation: Within functional limitis Intelligibility: Intelligible Motor Planning: Witnin functional limits Motor Speech Errors: Not applicable           Cherrie Gauze, M.S., [facility] [phone] ([place])   Quintella Baton 12/18/2021, 12:02 PM

## 2021-12-18 NOTE — Consult Note (Addendum)
NEURO HOSPITALIST CONSULT NOTE   Requestig physician: Dr. Francine Graven  Reason for Consult: Small right frontal lobe ischemic infarction  History obtained from:  Patient and Chart     HPI:                                                                                                                                          [name] is an 71 y.o. female with a PMHx of stroke with right sided weakness, anemia, CAD s/ CABG, CHF, CKD, DM, hypothyroidism, headache, HTN, HLD, sleep apnea and prior roux-en-Y gastric bypass who presented to the ED yesterday afternoon for assessment of left hand numbness first noted on awakening. LKN was Tuesday night when she went to bed at 10:30 PM. She also endorsed new onset of dizziness. She stated that her entire left arm was tingly and felt "different".She stated that she took 6 baby ASA on the morning of presentation to the hospital. She denied visual changes, slurred speech or any other stroke symptoms. Also had no SOB, CP or abdominal pain.   She previously was on Eliquis but "had to stop taking it" 2 years ago. It is unclear from her listed PMHx what the indication was for an anticoagulant. Current home medications include ASA and Plavix.   CT head was negative. MRI was then obtained, which revealed a faint, small DWI abnormality in the deep white matter of the right frontal lobe concerning for a small acute ischemic infarction.   Past Medical History:  Diagnosis Date   Anemia    Anemia, iron deficiency 11/07/2013   Arthritis    CAD (coronary artery disease)    Cerebrovascular accident [facility])    CHF (congestive heart failure) (HCC)    Chicken pox    Chronic kidney disease    Colitis    ? at 14   Diabetes mellitus    Hashimoto thyroiditis    w subsequent hypothyroidism   Headache    HTN (hypertension)    Hyperlipidemia    Hypothyroidism    Myocardial infarction ([place])    Postoperative malabsorption - s/p gastric bypass roux-en-Y  11/07/2013   Sleep apnea     Past Surgical History:  Procedure Laterality Date   ABDOMINAL HYSTERECTOMY     CHOLECYSTECTOMY     COLONOSCOPY WITH PROPOFOL N/A 12/24/2014   Procedure: COLONOSCOPY WITH PROPOFOL;  Surgeon: Hulen Luster, MD;  Location: [facility] ENDOSCOPY;  Service: Gastroenterology;  Laterality: N/A;   CORONARY ARTERY BYPASS GRAFT     ESOPHAGOGASTRODUODENOSCOPY N/A 09/02/2017   Procedure: ESOPHAGOGASTRODUODENOSCOPY (EGD);  Surgeon: Virgel Manifold, MD;  Location: [facility] ENDOSCOPY;  Service: Endoscopy;  Laterality: N/A;   ESOPHAGOGASTRODUODENOSCOPY (EGD) WITH PROPOFOL N/A 12/24/2014   Procedure: ESOPHAGOGASTRODUODENOSCOPY (EGD) WITH PROPOFOL;  Surgeon: Hulen Luster, MD;  Location: [facility] ENDOSCOPY;  Service: Gastroenterology;  Laterality: N/A;   JOINT REPLACEMENT     LOWER EXTREMITY ANGIOGRAPHY Left 07/17/2019   Procedure: Lower Extremity Angiography;  Surgeon: Algernon Huxley, MD;  Location: [place] [facility];  Service: Cardiovascular;  Laterality: Left;   ROUX-EN-Y GASTRIC BYPASS     loss over 100 pounds   TONSILLECTOMY     TOTAL KNEE ARTHROPLASTY Left     Family History  Problem Relation Age of Onset   Coronary artery disease Other    Stroke Maternal Grandmother    Diabetes Paternal Grandmother        diabetic coma   Heart attack Father    Heart attack Mother    Heart attack Maternal Grandfather    Heart attack Paternal Grandfather    Breast cancer Neg Hx              Social History:  reports that she has never smoked. She has never used smokeless tobacco. She reports that she does not drink alcohol and does not use drugs.  Allergies  Allergen Reactions   Niacin Other (See Comments)   Ultram [Tramadol Hcl]     Intolerance, makes woozy   Baclofen Other (See Comments)    Dizziness   Cardura [Doxazosin Mesylate] Other (See Comments)    Dizzy with passing out spells   Morphine And Related Nausea And Vomiting   Other Rash    Gold metal (nickel in the gold)     MEDICATIONS:                                                                                                                     Prior to Admission:  Medications Prior to Admission  Medication Sig Dispense Refill Last Dose   amLODipine (NORVASC) 5 MG tablet Take 5 mg by mouth daily.   12/17/2021   aspirin EC 81 MG tablet Take 81 mg by mouth daily.    12/17/2021   benazepril (LOTENSIN) 40 MG tablet TAKE ONE TABLET BY MOUTH ONCE DAILY (Patient taking differently: Take 40 mg by mouth daily.) 30 tablet 0 12/17/2021   cloNIDine (CATAPRES) 0.1 MG tablet Take 0.1 mg by mouth 2 (two) times daily.   12/17/2021   clopidogrel (PLAVIX) 75 MG tablet Take 1 tablet (75 mg total) by mouth daily. 30 tablet 6 12/17/2021   diphenhydrAMINE (BENADRYL) 50 MG capsule Take 50 mg by mouth at bedtime as needed for sleep (Takes Benadryl 50 mg with 1 tab Tylenol PM qHS as needed).   prn at prn   diphenhydramine-acetaminophen (TYLENOL PM) 25-500 MG TABS tablet Take 1 tablet by mouth at bedtime as needed (Takes 1 Tylenol PM tablet qHS PRN with Benadryl).   prn at prn   furosemide (LASIX) 40 MG tablet Take 1 tablet (40 mg total) by mouth daily. Must keep appt or further refills 30 tablet 5 prn at prn   glimepiride (AMARYL) 1 MG tablet Take 1 tablet by mouth daily.   12/17/2021  hydrALAZINE (APRESOLINE) 50 MG tablet Take 50 mg by mouth 3 (three) times daily.   12/17/2021   levothyroxine (SYNTHROID) 175 MCG tablet Take 175 mcg by mouth every other day.   12/17/2021   lovastatin (MEVACOR) 40 MG tablet Take 40 mg by mouth daily.   12/17/2021   Multiple Vitamin (MULTIVITAMIN WITH MINERALS) TABS tablet Take 1 tablet by mouth daily.   12/17/2021   pantoprazole (PROTONIX) 20 MG tablet Take 20 mg by mouth daily.   12/17/2021   allopurinol (ZYLOPRIM) 100 MG tablet Take 100 mg by mouth daily. (Patient not taking: Reported on 12/18/2021)   Completed Course at prn   benzonatate (TESSALON) 200 MG capsule Take 1 capsule (200 mg total) by mouth  every 8 (eight) hours. (Patient not taking: Reported on 12/18/2021) 21 capsule 0 Completed Course   cephALEXin (KEFLEX) 500 MG capsule Take 500 mg by mouth 2 (two) times daily. (Patient not taking: Reported on 12/18/2021)   Completed Course   clotrimazole-betamethasone (LOTRISONE) cream Apply to affected area 2 times daily prn (Patient not taking: Reported on 12/18/2021) 15 g 0 Not Taking   colchicine 0.6 MG tablet Take 0.6 mg by mouth 3 (three) times a week. (Patient not taking: Reported on 12/18/2021)   Completed Course   doxycycline (VIBRAMYCIN) 100 MG capsule Take 1 capsule (100 mg total) by mouth 2 (two) times daily. (Patient not taking: Reported on 12/18/2021) 14 capsule 0 Completed Course   Ferrous Sulfate (SLOW FE) 142 (45 Fe) MG TBCR Take 1 tablet by mouth 2 (two) times a week. (Patient not taking: Reported on 12/18/2021)   Completed Course   fluconazole (DIFLUCAN) 150 MG tablet Take one tab PO once, may repeat in 10 days if needed (Patient not taking: Reported on 12/18/2021) 2 tablet 0 Completed Course   Melatonin 10 MG CAPS Take 30 mg by mouth. (Patient not taking: Reported on 12/18/2021)   Not Taking   nitrofurantoin, macrocrystal-monohydrate, (MACROBID) 100 MG capsule Take 1 capsule (100 mg total) by mouth 2 (two) times daily. (Patient not taking: Reported on 12/18/2021) 10 capsule 0 Completed Course   potassium chloride (KLOR-CON) 10 MEQ tablet Take 2 tablets (20 mEq total) by mouth daily. 60 tablet 5 prn at prn   vitamin B-12 (CYANOCOBALAMIN) 1000 MCG tablet Take 1,000 mcg by mouth daily. (Patient not taking: Reported on 12/18/2021)   Not Taking   Scheduled:  [START ON 12/19/2021]  stroke: early stages of recovery book   Does not apply Once   aspirin EC  81 mg Oral Daily   clopidogrel  75 mg Oral Daily   ferrous sulfate  325 mg Oral Once per day on Mon Thu   insulin aspart  0-15 Units Subcutaneous Q4H   [START ON 12/19/2021] levothyroxine  175 mcg Oral QODAY   pantoprazole  20 mg Oral Daily    pravastatin  40 mg Oral q1800   vitamin B-12  1,000 mcg Oral Daily   Continuous:  [START ON 12/19/2021] cefTRIAXone (ROCEPHIN)  IV     sodium bicarbonate 150 mEq in sterile water 1,150 mL infusion 100 mL/hr at 12/18/21 1521     ROS:  As per HPI.    Blood pressure (!) 116/45, pulse 67, temperature 98.2 F (36.8 C), temperature source Oral, resp. rate 14, height 5\' 6"  (1.676 m), weight 85.7 kg, SpO2 99 %.   General Examination:                                                                                                       Physical Exam  HEENT-  [place]/AT    Lungs- Respirations unlabored Extremities- Warm and well perfused  Neurological Examination Mental Status: Awake and alert. Fully oriented. Thought content appropriate.  Speech fluent without evidence of aphasia.  Able to follow all commands without difficulty. Cranial Nerves: II: Temporal visual fields intact with no extinction to DSS. PERRL. III,IV, VI: No ptosis. EOMI. No nystagmus. V: Temp sensation equal bilaterally VII: Smile symmetric VIII: Hearing intact to voice IX,X: No hypophonia or hoarseness XI: Symmetric XII: Midline tongue extension Motor: RUE: 5/5 RLE: 5/5 LUE: 5/5 LLE: 5/5 Sensory: Temp and FT intact x 4. No extinction to DSS. Deep Tendon Reflexes: 2+ and symmetric bilateral brachioradialis and patellae Cerebellar: No ataxia with FNF bilaterally Gait: Deferred   Lab Results: Basic Metabolic Panel: Recent Labs  Lab 12/17/21 1623 12/18/21 0012  NA 141 142  K 5.0 4.4  CL 115* 117*  CO2 18* 18*  GLUCOSE 131* 88  BUN 55* 52*  CREATININE 2.99* 2.75*  CALCIUM 8.7* 8.3*    CBC: Recent Labs  Lab 12/17/21 1623  WBC 6.6  NEUTROABS 4.6  HGB 8.6*  HCT 29.6*  MCV 81.8  PLT 288    Cardiac Enzymes: No results for input(s): "CKTOTAL", "CKMB", "CKMBINDEX",  "TROPONINI" in the last 168 hours.  Lipid Panel: No results for input(s): "CHOL", "TRIG", "HDL", "CHOLHDL", "VLDL", "LDLCALC" in the last 168 hours.  Imaging: MR BRAIN WO CONTRAST  Result Date: 12/18/2021 CLINICAL DATA:  Initial evaluation for acute left upper extremity numbness. EXAM: MRI HEAD WITHOUT CONTRAST TECHNIQUE: Multiplanar, multiecho pulse sequences of the brain and surrounding structures were obtained without intravenous contrast. COMPARISON:  Prior CT from 12/17/2021. FINDINGS: Brain: Generalized age-related cerebral atrophy. Patchy and confluent T2/FLAIR hyperintensity involving the periventricular and deep white matter both cerebral hemispheres, most consistent with chronic small vessel ischemic disease, mild in nature. Superimposed remote lacunar infarct present at the posterior left frontal corona radiata. Few small remote cerebellar infarcts noted as well. There is a subtle linear focus of diffusion signal abnormality measuring approximately 1.8 cm in length involving the cortical subcortical posterior right frontal lobe (series 5, image 30). This is also seen on corresponding coronal DWI sequence (series 7, image 19). Suspected subtle associated ADC signal loss (series 6, image 30). While this finding could potentially be artifactual, a possible subtle acute ischemic infarct is difficult to exclude, particularly given the patient's symptoms. No associated hemorrhage or mass effect. No other evidence for acute or subacute ischemia. Gray-white matter differentiation otherwise maintained. No other areas of chronic cortical infarction. No acute intracranial hemorrhage. Punctate chronic microhemorrhage noted at the left cerebellum, of doubtful significance in isolation. No mass lesion, midline shift  or mass effect. No hydrocephalus or extra-axial fluid collection. Pituitary gland and suprasellar region within normal limits. Vascular: Major intracranial vascular flow voids are well maintained.  Skull and upper cervical spine: Craniocervical junction normal. Bone marrow signal intensity within normal limits. No scalp soft tissue abnormality. Sinuses/Orbits: Globes orbital soft tissues within normal limits. Mild scattered mucosal thickening noted about the ethmoidal air cells and maxillary sinuses. Paranasal sinuses are otherwise clear. No significant mastoid effusion. Other: None. IMPRESSION: 1. Subtle 1.8 cm linear focus of diffusion signal abnormality involving the cortical/subcortical right frontal lobe as above. While this finding could potentially be artifactual in nature, a possible acute ischemic infarct is difficult to exclude, particularly given the provided history. Correlation with physical exam and any potential symptomatology recommended. No associated hemorrhage or mass effect. 2. No other acute intracranial abnormality. 3. Age-related cerebral atrophy with mild chronic small vessel ischemic disease. Few small remote cerebellar infarcts. Electronically Signed   By: Jeannine Boga M.D.   On: 12/18/2021 01:50   CT Head Wo Contrast  Result Date: 12/17/2021 CLINICAL DATA:  Left arm and hand numbness. EXAM: CT HEAD WITHOUT CONTRAST TECHNIQUE: Contiguous axial images were obtained from the base of the skull through the vertex without intravenous contrast. RADIATION DOSE REDUCTION: This exam was performed according to the departmental dose-optimization program which includes automated exposure control, adjustment of the mA and/or kV according to patient size and/or use of iterative reconstruction technique. COMPARISON:  November 15 2017 FINDINGS: Brain: No evidence of acute infarction, hemorrhage, hydrocephalus, extra-axial collection or mass lesion/mass effect. Moderate brain parenchymal volume loss. Remote right cerebellar infarct. Vascular: No hyperdense vessel or unexpected calcification. Calcific atherosclerotic disease of the intra cavernous carotid arteries. Skull: Normal. Negative for  fracture or focal lesion. Sinuses/Orbits: No acute finding. Other: None. IMPRESSION: No acute intracranial abnormality. Electronically Signed   By: Fidela Salisbury M.D.   On: 12/17/2021 16:56     Assessment: 71 y.o. female with a PMHx of stroke with right sided weakness, anemia, CAD s/ CABG, CHF, CKD, DM, hypothyroidism, headache, HTN, HLD, sleep apnea and prior roux-en-Y gastric bypass who presented to the ED yesterday afternoon for assessment of left hand numbness first noted on awakening. LKN was Tuesday night when she went to bed at 10:30 PM. She also endorsed new onset of dizziness. She stated that her entire left arm was tingly and felt "different".She stated that she took 6 baby ASA on the morning of presentation to the hospital. She denied visual changes, slurred speech or any other stroke symptoms.  1. Exam reveals no focal neurological deficit.  2. MRI brain: Subtle 1.8 cm linear focus of diffusion signal abnormality involving the cortical/subcortical right frontal lobe as above. While this finding could potentially be artifactual in nature, a possible acute ischemic infarct is difficult to exclude, particularly given the provided history. Correlation with physical exam and any potential symptomatology recommended. No associated hemorrhage or mass effect. No other acute intracranial abnormality. Age-related cerebral atrophy with mild chronic small vessel ischemic disease. Few small remote cerebellar infarcts. 3. EKG: Sinus rhythm with 1st degree A-V block; Right superior axis deviation; Cannot rule out anterior infarct, age undetermined 4. Possible etiologies for the stroke are an acute lesion secondary to chronic ischemic microangiopathy, artery-to-artery embolization from atherosclerotic disease of the large and medium-sized arteries supplying the cerebral circulation, and cardioembolic stroke. 5. Stroke risk factors: Prior stroke, anemia, CAD, CHF, CKD, DM, HTN, HLD and sleep apnea     Recommendations: 1. Continue her home  ASA and Plavix.  2. HgbA1c, fasting lipid panel 3. MRA of the brain without contrast (ordered) 3. PT consult, OT consult, Speech consult 4. TTE if she has not had one within the last year 5. Cardiac telemetry 6. Carotid ultrasound (ordered) 7. Continue pravastatin 8. Risk factor modification with regular light exercise 9. Frequent neuro checks   Electronically signed: Dr. Kerney Elbe 12/18/2021, 9:10 AM

## 2021-12-18 NOTE — Progress Notes (Signed)
Admission profile updated. ?

## 2021-12-18 NOTE — Assessment & Plan Note (Signed)
Patient has a history of stage IV chronic kidney disease secondary to diabetes mellitus with a baseline serum creatinine of 1.8. Today on admission her serum creatinine is 2.9 with a normal anion gap metabolic acidosis and elevated BUN We will hydrate patient with bicarb infusion Obtain renal ultrasound to rule out obstructive uropathy Hold furosemide and benazepril for now Consult nephrology

## 2021-12-18 NOTE — Assessment & Plan Note (Signed)
Patient has pyuria and suprapubic pain. Prior urine culture from 12/06/21 yielded Klebsiella pneumonia sensitive to cephalosporins We will treat patient empirically with Rocephin 1 g IV daily

## 2021-12-18 NOTE — Progress Notes (Signed)
PT Cancellation Note  Patient Details Name: [name] MRN: [id number] DOB: 12-15-1950   Cancelled Treatment:    Reason Eval/Treat Not Completed: PT screened, no needs identified, will sign off. Per OT pt is at mobility baseline, no acute PT needs indicated, PT to sign off.    Lieutenant Diego PT, DPT 11:53 AM,12/18/21

## 2021-12-18 NOTE — Assessment & Plan Note (Signed)
H&H is stable ?Monitor closely during this hospitalization ?

## 2021-12-19 ENCOUNTER — Inpatient Hospital Stay: Payer: Medicare Other

## 2021-12-19 ENCOUNTER — Inpatient Hospital Stay: Admit: 2021-12-19 | Payer: Medicare Other

## 2021-12-19 ENCOUNTER — Inpatient Hospital Stay (HOSPITAL_COMMUNITY)
Admit: 2021-12-19 | Discharge: 2021-12-19 | Disposition: A | Discharge status: Home / Self Care | Payer: Medicare Other | Attending: Internal Medicine | Admitting: Internal Medicine

## 2021-12-19 DIAGNOSIS — I639 Cerebral infarction, unspecified: Secondary | ICD-10-CM | POA: Diagnosis not present

## 2021-12-19 DIAGNOSIS — I6389 Other cerebral infarction: Secondary | ICD-10-CM

## 2021-12-19 LAB — ECHOCARDIOGRAM COMPLETE
AR max vel: 2.6 cm2
AV Area VTI: 2.75 cm2
AV Area mean vel: 2.68 cm2
AV Mean grad: 4 mmHg
AV Peak grad: 8 mmHg
Ao pk vel: 1.41 m/s
Area-P 1/2: 3.79 cm2
Calc EF: 40.7 %
Height: 66 in
S' Lateral: 4.39 cm
Single Plane A2C EF: 40.7 %
Single Plane A4C EF: 40.2 %
Weight: 3024 oz

## 2021-12-19 LAB — LIPID PANEL
Cholesterol: 126 mg/dL (ref 0–200)
HDL: 39 mg/dL — ABNORMAL LOW (ref >40)
LDL Cholesterol: 76 mg/dL (ref 0–99)
Total CHOL/HDL Ratio: 3.2 RATIO
Triglycerides: 57 mg/dL (ref <150)
VLDL: 11 mg/dL (ref 0–40)

## 2021-12-19 LAB — GLUCOSE, CAPILLARY
Glucose-Capillary: 102 mg/dL — ABNORMAL HIGH (ref 70–99)
Glucose-Capillary: 93 mg/dL (ref 70–99)
Glucose-Capillary: 106 mg/dL — ABNORMAL HIGH (ref 70–99)
Glucose-Capillary: 106 mg/dL — ABNORMAL HIGH (ref 70–99)

## 2021-12-19 LAB — HEMOGLOBIN A1C
Hgb A1c MFr Bld: 6.4 % — ABNORMAL HIGH (ref 4.8–5.6)
Mean Plasma Glucose: 136.98 mg/dL

## 2021-12-19 MED ORDER — SODIUM BICARBONATE 650 MG PO TABS: 650.0000 mg | ORAL_TABLET | Freq: Two times a day (BID) | ORAL | 0 refills | Status: DC

## 2021-12-19 MED ORDER — CEPHALEXIN 250 MG PO CAPS: 250.0000 mg | ORAL_CAPSULE | Freq: Two times a day (BID) | ORAL | 0 refills | Status: AC

## 2021-12-19 MED ORDER — SODIUM BICARBONATE 650 MG PO TABS
650.0000 mg | ORAL_TABLET | Freq: Two times a day (BID) | ORAL | Status: DC
Start: 2021-12-19 — End: 2021-12-19
  Administered 2021-12-19: 650 mg via ORAL
  Filled 2021-12-19: qty 1

## 2021-12-19 MED ORDER — CEPHALEXIN 250 MG PO CAPS: 250.0000 mg | ORAL_CAPSULE | Freq: Two times a day (BID) | ORAL | Status: DC

## 2021-12-19 NOTE — Discharge Summary (Addendum)
Physician Discharge Summary   Patient: [name] MRN: [id number] DOB: [birth year]  Admit date:     12/17/2021  Discharge date: 12/19/21  Discharge Physician: Fritzi Mandes   PCP: Kirk Ruths, MD   Recommendations at discharge:   follow-up PCP in 1 to 2 week follow-up Dr. Candiss Norse in 1-2 weeks  Discharge Diagnoses: Principal Problem:   Acute CVA (cerebrovascular accident) [facility]) Active Problems:   AKI (acute kidney injury) ([place])   CKD stage 4 due to type 2 diabetes mellitus ([place])   PAD (peripheral artery disease) ([place])   UTI (urinary tract infection)   CAD, ARTERY BYPASS GRAFT   History of anemia due to chronic kidney disease   Hypothyroidism  Hospital Course:  [name] is a 71 y.o. female with medical history significant for diabetes mellitus with complications of stage IV chronic kidney disease, history of CVA with right-sided weakness, coronary artery disease, hypothyroidism, coronary artery disease status post CABG who presents to the ER for evaluation of left hand numbness.  Acute CVA right frontal lobe -- MRI shows 1.8 cm linear focus of diffuse signal right frontal lobe --Patient with a prior history of CVA with residual right arm weakness who presents to the ER for evaluation of left arm numbness that was present when she awoke on 12/17/21 associated with a headache. --Symptoms have improved but MRI is concerning for an acute infarct. --Allow for permissive hypertension Consult neurology-- noted recommend continue aspirin Plavix --Continue aspirin, Plavix and statins --We will request PT/OT/ST consult-- no needs -- echo shows EF of 40%  --US carotid 1. Mild (1-49%) stenosis proximal right internal carotid artery secondary to heterogenous atherosclerotic plaque. 2. Mild (1-49%) stenosis proximal left internal carotid artery secondary to heterogenous atherosclerotic plaque. 3. Vertebral arteries are patent with normal antegrade flow.   AKI (acute kidney  injury) ([place]) Patient has a history of stage IV chronic kidney disease secondary to diabetes mellitus with a baseline serum creatinine of 1.8. -- I have discontinued patient's Lasix. I have asked to resume her benazepril next week. She'll need close follow-up with nephrology Dr. Candiss Norse as outpatient -- patient started on sodium bicarbonate by Dr. Candiss Norse. -- Avoid nephrotoxins   CKD stage 4 due to type 2 diabetes mellitus ([place]) Patient has a history of type 2 diabetes mellitus with complications of stage IV chronic kidney disease and a baseline serum creatinine of 1.8. -Resume home meds at discharge   UTI (urinary tract infection) Patient has pyuria and suprapubic pain. Prior urine culture from 12/06/21 yielded Klebsiella pneumonia sensitive to cephalosporins will give Keflex for five days   PAD (peripheral artery disease) ([facility]) Stable Continue aspirin, Plavix and statins   CAD, ARTERY BYPASS GRAFT Stable Continue aspirin, Plavix and statins   History of anemia due to chronic kidney disease H&H is stable Monitor closely during this hospitalization   overall hemodynamically stable. Patient is eager to go home. She will follow-up with PCP and Dr. Candiss Norse closely to monitor her creatinine.       Consultants: neurology, nephrology Procedures performed: none Disposition: Home Diet recommendation:  Discharge Diet Orders (From admission, onward)     Start     Ordered   12/19/21 0000  Diet - low sodium heart healthy        12/19/21 1452           Cardiac and Carb modified diet DISCHARGE MEDICATION: Allergies as of 12/19/2021       Reactions   Niacin Other (See Comments)  Heart Attack   Ultram [tramadol Hcl]    Intolerance, makes woozy   Baclofen Other (See Comments)   Dizziness   Cardura [doxazosin Mesylate] Other (See Comments)   Dizzy with passing out spells   Morphine And Related Nausea And Vomiting   Other Rash   Gold metal (nickel in the gold)         Medication List     STOP taking these medications    allopurinol 100 MG tablet Commonly known as: ZYLOPRIM   benzonatate 200 MG capsule Commonly known as: TESSALON   clotrimazole-betamethasone cream Commonly known as: LOTRISONE   colchicine 0.6 MG tablet   diphenhydramine-acetaminophen 25-500 MG Tabs tablet Commonly known as: TYLENOL PM   doxycycline 100 MG capsule Commonly known as: VIBRAMYCIN   fluconazole 150 MG tablet Commonly known as: Diflucan   furosemide 40 MG tablet Commonly known as: LASIX   Melatonin 10 MG Caps   nitrofurantoin (macrocrystal-monohydrate) 100 MG capsule Commonly known as: MACROBID   potassium chloride 10 MEQ tablet Commonly known as: KLOR-CON   Slow Fe 142 (45 Fe) MG Tbcr Generic drug: Ferrous Sulfate   vitamin B-12 1000 MCG tablet Commonly known as: CYANOCOBALAMIN       TAKE these medications    amLODipine 5 MG tablet Commonly known as: NORVASC Take 5 mg by mouth daily.   aspirin EC 81 MG tablet Take 81 mg by mouth daily.   benazepril 40 MG tablet Commonly known as: LOTENSIN TAKE ONE TABLET BY MOUTH ONCE DAILY Notes to patient: Start taking from Monday 7/17   cephALEXin 250 MG capsule Commonly known as: KEFLEX Take 1 capsule (250 mg total) by mouth every 12 (twelve) hours for 3 days. What changed:  medication strength how much to take when to take this   cloNIDine 0.1 MG tablet Commonly known as: CATAPRES Take 0.1 mg by mouth 2 (two) times daily.   clopidogrel 75 MG tablet Commonly known as: PLAVIX Take 1 tablet (75 mg total) by mouth daily.   diphenhydrAMINE 50 MG capsule Commonly known as: BENADRYL Take 50 mg by mouth at bedtime as needed for sleep (Takes Benadryl 50 mg with 1 tab Tylenol PM qHS as needed).   glimepiride 1 MG tablet Commonly known as: AMARYL Take 1 tablet by mouth daily.   hydrALAZINE 50 MG tablet Commonly known as: APRESOLINE Take 50 mg by mouth 3 (three) times daily.    levothyroxine 175 MCG tablet Commonly known as: SYNTHROID Take 175 mcg by mouth every other day.   lovastatin 40 MG tablet Commonly known as: MEVACOR Take 40 mg by mouth daily.   multivitamin with minerals Tabs tablet Take 1 tablet by mouth daily.   pantoprazole 20 MG tablet Commonly known as: PROTONIX Take 20 mg by mouth daily.   sodium bicarbonate 650 MG tablet Take 1 tablet (650 mg total) by mouth 2 (two) times daily.        Follow-up Information     Kirk Ruths, MD. Schedule an appointment as soon as possible for a visit in 1 week(s).   Specialty: Internal Medicine Contact information: [facility] [address] [place] [address] [phone]         Murlean Iba, MD. Schedule an appointment as soon as possible for a visit in 1 week(s).   Specialty: Nephrology Why: CKD-IV Contact information: [place] [address] [phone]  Discharge Exam: Filed Weights   12/17/21 1619  Weight: 85.7 kg     Condition at discharge: fair  The results of significant diagnostics from this hospitalization (including imaging, microbiology, ancillary and laboratory) are listed below for reference.   Imaging Studies: ECHOCARDIOGRAM COMPLETE  Result Date: 12/19/2021    ECHOCARDIOGRAM REPORT   Patient Name:   [name] Date of Exam: 12/19/2021 Medical Rec #:  [id number]      Height:       66.0 in Accession #:    [id number]     Weight:       189.0 lb Date of Birth:  12-21-1950      BSA:          1.952 m Patient Age:    36 years       BP:           159/59 mmHg Patient Gender: F              HR:           84 bpm. Exam Location:  [facility] Procedure: 2D Echo, Color Doppler and Cardiac Doppler Indications:     I63.9 Stroke  History:         Patient has prior history of Echocardiogram examinations. CAD,                  Prior CABG, Stroke and CKD; Risk Factors:Hypertension,                  Diabetes,  Dyslipidemia and Sleep Apnea.  Sonographer:     Charmayne Sheer Referring Phys:  [id number] AGBATA Diagnosing Phys: Ida Rogue MD  Sonographer Comments: Suboptimal subcostal window. IMPRESSIONS  1. Left ventricular ejection fraction, by estimation, is 30 to 35%. The left ventricle has moderately decreased function. The left ventricle demonstrates global hypokinesis. There is moderate asymmetric left ventricular hypertrophy of the basal-septal segment. Left ventricular diastolic parameters are consistent with Grade I diastolic dysfunction (impaired relaxation).  2. Right ventricular systolic function is normal. The right ventricular size is normal. Tricuspid regurgitation signal is inadequate for assessing PA pressure.  3. Left atrial size was moderately dilated.  4. The mitral valve is normal in structure. Mild mitral valve regurgitation. No evidence of mitral stenosis.  5. The aortic valve has an indeterminant number of cusps. Aortic valve regurgitation is not visualized. No aortic stenosis is present.  6. There is borderline dilatation of the ascending aorta, measuring 37 mm.  7. The inferior vena cava is normal in size with greater than 50% respiratory variability, suggesting right atrial pressure of 3 mmHg. FINDINGS  Left Ventricle: Left ventricular ejection fraction, by estimation, is 30 to 35%. The left ventricle has moderately decreased function. The left ventricle demonstrates global hypokinesis. The left ventricular internal cavity size was normal in size. There is moderate asymmetric left ventricular hypertrophy of the basal-septal segment. Left ventricular diastolic parameters are consistent with Grade I diastolic dysfunction (impaired relaxation). Right Ventricle: The right ventricular size is normal. No increase in right ventricular wall thickness. Right ventricular systolic function is normal. Tricuspid regurgitation signal is inadequate for assessing PA pressure. Left Atrium: Left atrial size was  moderately dilated. Right Atrium: Right atrial size was normal in size. Pericardium: There is no evidence of pericardial effusion. Mitral Valve: The mitral valve is normal in structure. There is mild calcification of the mitral valve leaflet(s). Mild mitral valve regurgitation. No evidence of mitral valve stenosis. Tricuspid Valve: The tricuspid valve is  normal in structure. Tricuspid valve regurgitation is mild . No evidence of tricuspid stenosis. Aortic Valve: The aortic valve has an indeterminant number of cusps. Aortic valve regurgitation is not visualized. No aortic stenosis is present. Aortic valve mean gradient measures 4.0 mmHg. Aortic valve peak gradient measures 8.0 mmHg. Aortic valve area, by VTI measures 2.75 cm. Pulmonic Valve: The pulmonic valve was normal in structure. Pulmonic valve regurgitation is not visualized. No evidence of pulmonic stenosis. Aorta: The aortic root is normal in size and structure. There is borderline dilatation of the ascending aorta, measuring 37 mm. Venous: The inferior vena cava is normal in size with greater than 50% respiratory variability, suggesting right atrial pressure of 3 mmHg. IAS/Shunts: No atrial level shunt detected by color flow Doppler.  LEFT VENTRICLE PLAX 2D LVIDd:         5.20 cm      Diastology LVIDs:         4.39 cm      LV e' medial:    4.46 cm/s LV PW:         0.98 cm      LV E/e' medial:  15.9 LV IVS:        1.51 cm      LV e' lateral:   6.85 cm/s LVOT diam:     2.10 cm      LV E/e' lateral: 10.4 LV SV:         80 LV SV Index:   41 LVOT Area:     3.46 cm  LV Volumes (MOD) LV vol d, MOD A2C: 194.0 ml LV vol d, MOD A4C: 194.0 ml LV vol s, MOD A2C: 115.0 ml LV vol s, MOD A4C: 116.0 ml LV SV MOD A2C:     79.0 ml LV SV MOD A4C:     194.0 ml LV SV MOD BP:      79.9 ml RIGHT VENTRICLE RV Basal diam:  3.43 cm LEFT ATRIUM              Index        RIGHT ATRIUM           Index LA diam:        5.10 cm  2.61 cm/m   RA Area:     13.10 cm LA Vol (A2C):   92.6 ml   47.43 ml/m  RA Volume:   33.30 ml  17.06 ml/m LA Vol (A4C):   106.0 ml 54.29 ml/m LA Biplane Vol: 99.8 ml  51.12 ml/m  AORTIC VALVE                    PULMONIC VALVE AV Area (Vmax):    2.60 cm     PV Vmax:       1.14 m/s AV Area (Vmean):   2.68 cm     PV Peak grad:  5.2 mmHg AV Area (VTI):     2.75 cm AV Vmax:           141.00 cm/s AV Vmean:          95.900 cm/s AV VTI:            0.291 m AV Peak Grad:      8.0 mmHg AV Mean Grad:      4.0 mmHg LVOT Vmax:         106.00 cm/s LVOT Vmean:        74.300 cm/s LVOT VTI:  0.231 m LVOT/AV VTI ratio: 0.79  AORTA Ao Root diam: 3.50 cm MITRAL VALVE MV Area (PHT): 3.79 cm    SHUNTS MV Decel Time: 200 msec    Systemic VTI:  0.23 m MV E velocity: 71.10 cm/s  Systemic Diam: 2.10 cm MV A velocity: 96.00 cm/s MV E/A ratio:  0.74 Ida Rogue MD Electronically signed by Ida Rogue MD Signature Date/Time: 12/19/2021/2:36:06 PM    Final    MR ANGIO HEAD WO CONTRAST  Result Date: 12/19/2021 CLINICAL DATA:  Stroke, follow up; left arm weakness EXAM: MRA HEAD WITHOUT CONTRAST TECHNIQUE: Angiographic images of the Circle of Willis were acquired using MRA technique without intravenous contrast. COMPARISON:  None Available. FINDINGS: Anterior circulation: Intracranial internal carotid arteries are patent with atherosclerotic irregularity. Anterior and middle cerebral arteries are patent with atherosclerotic irregularity. Focal marked stenosis of left A2 ACA. Posterior circulation: Partially imaged intracranially tibial arteries are patent. Basilar artery is patent. Posterior cerebral arteries are patent. Right posterior communicating artery is present. Other: None. IMPRESSION: No large vessel occlusion. Mild multifocal atherosclerotic irregularity. Focal marked stenosis of left A2 ACA. Electronically Signed   By: Macy Mis M.D.   On: 12/19/2021 10:41   US RENAL  Result Date: 12/18/2021 CLINICAL DATA:  Renal dysfunction EXAM: RENAL / URINARY TRACT ULTRASOUND  COMPLETE COMPARISON:  CT abdomen done on 02/12/2019 FINDINGS: Right Kidney: Renal measurements: 9.8 x 5.3 x 3.8 cm = volume: 104.18 mL. There is no hydronephrosis. There is 4 mm hyperechoic focus in the lower pole. There is no perinephric fluid collection. Left Kidney: Renal measurements: 9.3 x 4.3 x 4.4 cm = volume: 92.06 mL. There is no hydronephrosis. There is no focal cortical thinning. There is no perinephric fluid collection. Bladder: There is incomplete distention of the bladder. Other: None. IMPRESSION: There is no hydronephrosis. There is possible 4 mm right renal calculus. Electronically Signed   By: Elmer Picker M.D.   On: 12/18/2021 13:26   MR BRAIN WO CONTRAST  Result Date: 12/18/2021 CLINICAL DATA:  Initial evaluation for acute left upper extremity numbness. EXAM: MRI HEAD WITHOUT CONTRAST TECHNIQUE: Multiplanar, multiecho pulse sequences of the brain and surrounding structures were obtained without intravenous contrast. COMPARISON:  Prior CT from 12/17/2021. FINDINGS: Brain: Generalized age-related cerebral atrophy. Patchy and confluent T2/FLAIR hyperintensity involving the periventricular and deep white matter both cerebral hemispheres, most consistent with chronic small vessel ischemic disease, mild in nature. Superimposed remote lacunar infarct present at the posterior left frontal corona radiata. Few small remote cerebellar infarcts noted as well. There is a subtle linear focus of diffusion signal abnormality measuring approximately 1.8 cm in length involving the cortical subcortical posterior right frontal lobe (series 5, image 30). This is also seen on corresponding coronal DWI sequence (series 7, image 19). Suspected subtle associated ADC signal loss (series 6, image 30). While this finding could potentially be artifactual, a possible subtle acute ischemic infarct is difficult to exclude, particularly given the patient's symptoms. No associated hemorrhage or mass effect. No other  evidence for acute or subacute ischemia. Gray-white matter differentiation otherwise maintained. No other areas of chronic cortical infarction. No acute intracranial hemorrhage. Punctate chronic microhemorrhage noted at the left cerebellum, of doubtful significance in isolation. No mass lesion, midline shift or mass effect. No hydrocephalus or extra-axial fluid collection. Pituitary gland and suprasellar region within normal limits. Vascular: Major intracranial vascular flow voids are well maintained. Skull and upper cervical spine: Craniocervical junction normal. Bone marrow signal intensity within normal limits. No scalp  soft tissue abnormality. Sinuses/Orbits: Globes orbital soft tissues within normal limits. Mild scattered mucosal thickening noted about the ethmoidal air cells and maxillary sinuses. Paranasal sinuses are otherwise clear. No significant mastoid effusion. Other: None. IMPRESSION: 1. Subtle 1.8 cm linear focus of diffusion signal abnormality involving the cortical/subcortical right frontal lobe as above. While this finding could potentially be artifactual in nature, a possible acute ischemic infarct is difficult to exclude, particularly given the provided history. Correlation with physical exam and any potential symptomatology recommended. No associated hemorrhage or mass effect. 2. No other acute intracranial abnormality. 3. Age-related cerebral atrophy with mild chronic small vessel ischemic disease. Few small remote cerebellar infarcts. Electronically Signed   By: Jeannine Boga M.D.   On: 12/18/2021 01:50   CT Head Wo Contrast  Result Date: 12/17/2021 CLINICAL DATA:  Left arm and hand numbness. EXAM: CT HEAD WITHOUT CONTRAST TECHNIQUE: Contiguous axial images were obtained from the base of the skull through the vertex without intravenous contrast. RADIATION DOSE REDUCTION: This exam was performed according to the departmental dose-optimization program which includes automated exposure  control, adjustment of the mA and/or kV according to patient size and/or use of iterative reconstruction technique. COMPARISON:  November 15 2017 FINDINGS: Brain: No evidence of acute infarction, hemorrhage, hydrocephalus, extra-axial collection or mass lesion/mass effect. Moderate brain parenchymal volume loss. Remote right cerebellar infarct. Vascular: No hyperdense vessel or unexpected calcification. Calcific atherosclerotic disease of the intra cavernous carotid arteries. Skull: Normal. Negative for fracture or focal lesion. Sinuses/Orbits: No acute finding. Other: None. IMPRESSION: No acute intracranial abnormality. Electronically Signed   By: Fidela Salisbury M.D.   On: 12/17/2021 16:56    Microbiology: Results for orders placed or performed during the hospital encounter of 03/27/21  Urine Culture     Status: Abnormal   Collection Time: 03/27/21  5:44 PM   Specimen: Urine, Clean Catch  Result Value Ref Range Status   Specimen Description URINE, CLEAN CATCH  Final   Special Requests   Final    NONE Performed at [facility], [place] [address], [place], [place] [address]    Culture >=100,000 COLONIES/mL ESCHERICHIA COLI (A)  Final   Report Status 03/29/2021 FINAL  Final   Organism ID, Bacteria ESCHERICHIA COLI (A)  Final      Susceptibility   Escherichia coli - MIC*    AMPICILLIN 4 SENSITIVE Sensitive     CEFAZOLIN <=4 SENSITIVE Sensitive     CEFEPIME <=0.12 SENSITIVE Sensitive     CEFTRIAXONE <=0.25 SENSITIVE Sensitive     CIPROFLOXACIN 0.5 INTERMEDIATE Intermediate     GENTAMICIN <=1 SENSITIVE Sensitive     IMIPENEM <=0.25 SENSITIVE Sensitive     NITROFURANTOIN <=16 SENSITIVE Sensitive     TRIMETH/SULFA <=20 SENSITIVE Sensitive     AMPICILLIN/SULBACTAM <=2 SENSITIVE Sensitive     PIP/TAZO <=4 SENSITIVE Sensitive     * >=100,000 COLONIES/mL ESCHERICHIA COLI    Labs: CBC: Recent Labs  Lab 12/17/21 1623  WBC 6.6  NEUTROABS 4.6  HGB 8.6*  HCT 29.6*  MCV 81.8  PLT 314    Basic Metabolic Panel: Recent Labs  Lab 12/17/21 1623 12/18/21 0012  NA 141 142  K 5.0 4.4  CL 115* 117*  CO2 18* 18*  GLUCOSE 131* 88  BUN 55* 52*  CREATININE 2.99* 2.75*  CALCIUM 8.7* 8.3*   Liver Function Tests: No results for input(s): "AST", "ALT", "ALKPHOS", "BILITOT", "PROT", "ALBUMIN" in the last 168 hours. CBG: Recent Labs  Lab 12/18/21 2026 12/19/21  0103 12/19/21 0545 12/19/21 0806 12/19/21 1151  GLUCAP 138* 102* 93 106* 106*    Discharge time spent: greater than 30 minutes.  Signed: Fritzi Mandes, MD [facility] 12/19/2021

## 2021-12-19 NOTE — Clinical Social Work Note (Signed)
  Transition of Care (TOC) Screening Note   Patient Details  Name: [name] Date of Birth: Mar 03, 1951   Transition of Care [facility]) CM/SW Contact:    Eileen [place], LCSW Phone [place] 12/19/2021, 1:00 PM    Transition of Care Department [facility]) has reviewed patient and no TOC needs have been identified at this time. We will continue to monitor patient advancement through interdisciplinary progression rounds. If new patient transition needs arise, please place a TOC consult.

## 2021-12-19 NOTE — Progress Notes (Signed)
*  PRELIMINARY RESULTS* Echocardiogram 2D Echocardiogram has been performed.  [name] 12/19/2021, 2:23 PM

## 2021-12-19 NOTE — Consult Note (Signed)
[facility] Consult Note: December 19, 2021    Date of Admission:  12/17/2021           Reason for Consult:  Acute kidney injury on chronic kidney disease   Referring Provider: Fritzi Mandes, MD Primary Care Provider: Kirk Ruths, MD   History of Presenting Illness:  [name] is a 71 y.o. female with diabetes complicated by CKD, history of stroke with right-sided weakness, coronary artery disease, status post CABG, hypothyroidism.  She presented with left hand numbness and was diagnosed with stroke. Today at the numbness is better and she feels like her left hand strength is back to usual.  She was noticed to have elevated creatinine therefore nephrology consult has been requested for evaluation.   Review of Systems: Review of Systems  Constitutional:  Negative for chills and fever.  HENT:  Negative for hearing loss.   Eyes:  Negative for blurred vision and double vision.  Respiratory:  Negative for cough and hemoptysis.   Cardiovascular:  Positive for chest pain and palpitations.  Gastrointestinal:  Negative for heartburn, nausea and vomiting.  Genitourinary:  Negative for dysuria and urgency.  Musculoskeletal:  Negative for myalgias.  Skin:  Negative for rash.  Neurological:  Positive for focal weakness. Negative for dizziness.  Endo/Heme/Allergies:  Does not bruise/bleed easily.  Psychiatric/Behavioral:  Negative for depression.     Past Medical History:  Diagnosis Date   Anemia    Anemia, iron deficiency 11/07/2013   Arthritis    CAD (coronary artery disease)    Cerebrovascular accident ([place])    CHF (congestive heart failure) (HCC)    Chicken pox    Chronic kidney disease    Colitis    ? 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 rate 16, height 5\' 6"  (1.676 m), weight 85.7 kg, SpO2 99 %.  Physical Exam General appearance: Laying in the bed, no acute distress HEENT: Moist oral mucous membranes, anicteric Neck: Supple, no masses Pulmonary: Normal breathing effort on room air, clear to auscultation Cardiovascular: No rub or gallop Abdomen: Soft, nontender Extremities: No peripheral edema Neuro: Alert, oriented.  Speech normal.  Left hand weakness has resolved. Skin: Warm, dry.  No acute rashes.  Lab Results Lab Results  Component Value Date   WBC 6.6 12/17/2021   HGB 8.6 (L) 12/17/2021   HCT 29.6 (L) 12/17/2021   MCV 81.8 12/17/2021   PLT 288 12/17/2021  Lab Results  Component Value Date   CREATININE 2.75 (H) 12/18/2021   BUN 52 (H) 12/18/2021   NA 142 12/18/2021   K 4.4 12/18/2021   CL 117 (H) 12/18/2021   CO2 18 (L) 12/18/2021    Lab Results  Component Value Date   ALT 14 07/15/2019   AST 27 07/15/2019   ALKPHOS 118 07/15/2019   BILITOT 0.6 07/15/2019     Microbiology: No results found for this or any previous visit (from the past 240 hour(s)).  Medications: Scheduled Meds:   stroke: early stages of recovery book   Does not apply Once   aspirin EC  81 mg Oral Daily   clopidogrel  75 mg Oral Daily   ferrous sulfate  325 mg Oral Once per day on Mon Thu   insulin aspart   0-15 Units Subcutaneous Q4H   levothyroxine  175 mcg Oral QODAY   pantoprazole  20 mg Oral Daily   pravastatin  40 mg Oral q1800   vitamin B-12  1,000 mcg Oral Daily   Continuous Infusions:  cefTRIAXone (ROCEPHIN)  IV 1 g (12/19/21 0249)   PRN Meds:.acetaminophen **OR** acetaminophen (TYLENOL) oral liquid 160 mg/5 mL **OR** acetaminophen, melatonin  Allergies  Allergen Reactions   Niacin Other (See Comments)    Heart Attack   Ultram [Tramadol Hcl]     Intolerance, makes woozy   Baclofen Other (See Comments)    Dizziness   Cardura [Doxazosin Mesylate] Other (See Comments)    Dizzy with passing out spells   Morphine And Related Nausea And Vomiting   Other Rash    Gold metal (nickel in the gold)    Urinalysis: Recent Labs    12/18/21 0012  COLORURINE YELLOW*  LABSPEC 1.012  PHURINE 5.0  GLUCOSEU NEGATIVE  HGBUR NEGATIVE  BILIRUBINUR NEGATIVE  KETONESUR NEGATIVE  PROTEINUR 30*  NITRITE NEGATIVE  LEUKOCYTESUR LARGE*      Imaging: US RENAL  Result Date: 12/18/2021 CLINICAL DATA:  Renal dysfunction EXAM: RENAL / URINARY TRACT ULTRASOUND COMPLETE COMPARISON:  CT abdomen done on 02/12/2019 FINDINGS: Right Kidney: Renal measurements: 9.8 x 5.3 x 3.8 cm = volume: 104.18 mL. There is no hydronephrosis. There is 4 mm hyperechoic focus in the lower pole. There is no perinephric fluid collection. Left Kidney: Renal measurements: 9.3 x 4.3 x 4.4 cm = volume: 92.06 mL. There is no hydronephrosis. There is no focal cortical thinning. There is no perinephric fluid collection. Bladder: There is incomplete distention of the bladder. Other: None. IMPRESSION: There is no hydronephrosis. There is possible 4 mm right renal calculus. Electronically Signed   By: Elmer Picker M.D.   On: 12/18/2021 13:26   MR BRAIN WO CONTRAST  Result Date: 12/18/2021 CLINICAL DATA:  Initial evaluation for acute left upper extremity numbness. EXAM: MRI HEAD WITHOUT CONTRAST TECHNIQUE: Multiplanar,  multiecho pulse sequences of the brain and surrounding structures were obtained without intravenous contrast. COMPARISON:  Prior CT from 12/17/2021. FINDINGS: Brain: Generalized age-related cerebral atrophy. Patchy and confluent T2/FLAIR hyperintensity involving the periventricular and deep white matter both cerebral hemispheres, most consistent with chronic small vessel ischemic disease, mild in nature. Superimposed remote lacunar infarct present at the posterior left frontal corona radiata. Few small remote cerebellar infarcts noted as well. There is a subtle linear focus of diffusion signal abnormality measuring approximately 1.8 cm in length involving the cortical subcortical posterior right frontal lobe (series 5, image 30). This is also seen on corresponding coronal DWI sequence (series 7, image 19). Suspected  subtle associated ADC signal loss (series 6, image 30). While this finding could potentially be artifactual, a possible subtle acute ischemic infarct is difficult to exclude, particularly given the patient's symptoms. No associated hemorrhage or mass effect. No other evidence for acute or subacute ischemia. Gray-white matter differentiation otherwise maintained. No other areas of chronic cortical infarction. No acute intracranial hemorrhage. Punctate chronic microhemorrhage noted at the left cerebellum, of doubtful significance in isolation. No mass lesion, midline shift or mass effect. No hydrocephalus or extra-axial fluid collection. Pituitary gland and suprasellar region within normal limits. Vascular: Major intracranial vascular flow voids are well maintained. Skull and upper cervical spine: Craniocervical junction normal. Bone marrow signal intensity within normal limits. No scalp soft tissue abnormality. Sinuses/Orbits: Globes orbital soft tissues within normal limits. Mild scattered mucosal thickening noted about the ethmoidal air cells and maxillary sinuses. Paranasal sinuses are otherwise clear.  No significant mastoid effusion. Other: None. IMPRESSION: 1. Subtle 1.8 cm linear focus of diffusion signal abnormality involving the cortical/subcortical right frontal lobe as above. While this finding could potentially be artifactual in nature, a possible acute ischemic infarct is difficult to exclude, particularly given the provided history. Correlation with physical exam and any potential symptomatology recommended. No associated hemorrhage or mass effect. 2. No other acute intracranial abnormality. 3. Age-related cerebral atrophy with mild chronic small vessel ischemic disease. Few small remote cerebellar infarcts. Electronically Signed   By: Jeannine Boga M.D.   On: 12/18/2021 01:50   CT Head Wo Contrast  Result Date: 12/17/2021 CLINICAL DATA:  Left arm and hand numbness. EXAM: CT HEAD WITHOUT CONTRAST TECHNIQUE: Contiguous axial images were obtained from the base of the skull through the vertex without intravenous contrast. RADIATION DOSE REDUCTION: This exam was performed according to the departmental dose-optimization program which includes automated exposure control, adjustment of the mA and/or kV according to patient size and/or use of iterative reconstruction technique. COMPARISON:  November 15 2017 FINDINGS: Brain: No evidence of acute infarction, hemorrhage, hydrocephalus, extra-axial collection or mass lesion/mass effect. Moderate brain parenchymal volume loss. Remote right cerebellar infarct. Vascular: No hyperdense vessel or unexpected calcification. Calcific atherosclerotic disease of the intra cavernous carotid arteries. Skull: Normal. Negative for fracture or focal lesion. Sinuses/Orbits: No acute finding. Other: None. IMPRESSION: No acute intracranial abnormality. Electronically Signed   By: Fidela Salisbury M.D.   On: 12/17/2021 16:56      Assessment/Plan:  TRANNIE BARDALES is a 72 y.o. 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  Baseline creatinine 1.8, GFR 28 from Oct 23, 2021. #Hypertension with chronic kidney disease #Anemia of chronic kidney disease  #Acute kidney injury-exact cause is unclear.  Urinalysis with large leukocytes, some protein noted.Renal ultrasound from July 13-no hydronephrosis.  Possible 4 mm right renal stone.  Nonobstructing. AKI possibly related to hemodynamic instability.  Serum creatinine upon presenting was 2.99 which has improved slightly to 2.75 today.  Baseline creatinine of 1.8/GFR 28 from Oct 23, 2021 -Electrolytes and volume status are acceptable.  No acute indication for dialysis at present.  We will start oral sodium bicarbonate supplementation for chronic metabolic acidosis.  Pyuria may indicate UTI.  Currently getting empiric treatment with IV Rocephin.  #Chronic kidney disease stage IV Likely secondary to diabetes, hypertension, atherosclerosis. Baseline creatinine of 1.8/GFR 28 from 18 2023. Home regimen includes lovastatin, benazepril, clopidogrel for cardiovascular risk reduction.  Not on SGLT2 inhibitor.  #Hypertension Currently not on any antihypertensives.  Held due to acute illness.  ACE inhibitor on hold due to AKI.  We will continue to monitor. Home regimen included amlodipine, benazepril, clonidine, furosemide, hydralazine.  #Anemia of chronic kidney disease. Most recent hemoglobin is 8.6.  May need outpatient consultation for ESA  therapy.  Sherial Ebrahim Candiss Norse 12/19/21

## 2022-03-03 ENCOUNTER — Other Ambulatory Visit: Payer: Self-pay | Admitting: Internal Medicine

## 2022-03-03 DIAGNOSIS — Z1231 Encounter for screening mammogram for malignant neoplasm of breast: Secondary | ICD-10-CM

## 2022-04-07 ENCOUNTER — Other Ambulatory Visit: Payer: Self-pay

## 2022-04-07 ENCOUNTER — Emergency Department: Payer: Medicare Other

## 2022-04-07 ENCOUNTER — Encounter: Payer: Self-pay | Admitting: Internal Medicine

## 2022-04-07 ENCOUNTER — Observation Stay
Admission: EM | Admit: 2022-04-07 | Discharge: 2022-04-09 | Disposition: A | Discharge status: Home / Self Care | Payer: Medicare Other | Attending: Internal Medicine | Admitting: Internal Medicine

## 2022-04-07 ENCOUNTER — Inpatient Hospital Stay: Payer: Medicare Other

## 2022-04-07 DIAGNOSIS — R9431 Abnormal electrocardiogram [ECG] [EKG]: Secondary | ICD-10-CM | POA: Diagnosis present

## 2022-04-07 DIAGNOSIS — I252 Old myocardial infarction: Secondary | ICD-10-CM

## 2022-04-07 DIAGNOSIS — Z9884 Bariatric surgery status: Secondary | ICD-10-CM | POA: Insufficient documentation

## 2022-04-07 DIAGNOSIS — Z7902 Long term (current) use of antithrombotics/antiplatelets: Secondary | ICD-10-CM | POA: Insufficient documentation

## 2022-04-07 DIAGNOSIS — I130 Hypertensive heart and chronic kidney disease with heart failure and stage 1 through stage 4 chronic kidney disease, or unspecified chronic kidney disease: Secondary | ICD-10-CM | POA: Diagnosis not present

## 2022-04-07 DIAGNOSIS — Z79890 Hormone replacement therapy (postmenopausal): Secondary | ICD-10-CM | POA: Insufficient documentation

## 2022-04-07 DIAGNOSIS — E1122 Type 2 diabetes mellitus with diabetic chronic kidney disease: Secondary | ICD-10-CM | POA: Diagnosis not present

## 2022-04-07 DIAGNOSIS — I6782 Cerebral ischemia: Secondary | ICD-10-CM | POA: Insufficient documentation

## 2022-04-07 DIAGNOSIS — E11649 Type 2 diabetes mellitus with hypoglycemia without coma: Secondary | ICD-10-CM | POA: Insufficient documentation

## 2022-04-07 DIAGNOSIS — R55 Syncope and collapse: Principal | ICD-10-CM | POA: Insufficient documentation

## 2022-04-07 DIAGNOSIS — Z794 Long term (current) use of insulin: Secondary | ICD-10-CM | POA: Diagnosis not present

## 2022-04-07 DIAGNOSIS — I2510 Atherosclerotic heart disease of native coronary artery without angina pectoris: Secondary | ICD-10-CM | POA: Diagnosis not present

## 2022-04-07 DIAGNOSIS — E039 Hypothyroidism, unspecified: Secondary | ICD-10-CM | POA: Diagnosis present

## 2022-04-07 DIAGNOSIS — E162 Hypoglycemia, unspecified: Principal | ICD-10-CM | POA: Diagnosis present

## 2022-04-07 DIAGNOSIS — R296 Repeated falls: Secondary | ICD-10-CM | POA: Diagnosis not present

## 2022-04-07 DIAGNOSIS — E119 Type 2 diabetes mellitus without complications: Secondary | ICD-10-CM

## 2022-04-07 DIAGNOSIS — I509 Heart failure, unspecified: Secondary | ICD-10-CM | POA: Diagnosis not present

## 2022-04-07 DIAGNOSIS — I10 Essential (primary) hypertension: Secondary | ICD-10-CM | POA: Diagnosis present

## 2022-04-07 DIAGNOSIS — Z7984 Long term (current) use of oral hypoglycemic drugs: Secondary | ICD-10-CM | POA: Insufficient documentation

## 2022-04-07 DIAGNOSIS — I25810 Atherosclerosis of coronary artery bypass graft(s) without angina pectoris: Secondary | ICD-10-CM | POA: Diagnosis present

## 2022-04-07 DIAGNOSIS — N183 Chronic kidney disease, stage 3 unspecified: Secondary | ICD-10-CM | POA: Diagnosis not present

## 2022-04-07 DIAGNOSIS — R2681 Unsteadiness on feet: Secondary | ICD-10-CM | POA: Diagnosis not present

## 2022-04-07 DIAGNOSIS — Z8673 Personal history of transient ischemic attack (TIA), and cerebral infarction without residual deficits: Secondary | ICD-10-CM | POA: Diagnosis not present

## 2022-04-07 DIAGNOSIS — Z7982 Long term (current) use of aspirin: Secondary | ICD-10-CM | POA: Diagnosis not present

## 2022-04-07 DIAGNOSIS — I739 Peripheral vascular disease, unspecified: Secondary | ICD-10-CM | POA: Diagnosis present

## 2022-04-07 DIAGNOSIS — Z951 Presence of aortocoronary bypass graft: Secondary | ICD-10-CM | POA: Diagnosis not present

## 2022-04-07 DIAGNOSIS — N184 Chronic kidney disease, stage 4 (severe): Secondary | ICD-10-CM | POA: Diagnosis present

## 2022-04-07 DIAGNOSIS — D649 Anemia, unspecified: Secondary | ICD-10-CM | POA: Insufficient documentation

## 2022-04-07 HISTORY — DX: Type 2 diabetes mellitus with diabetic peripheral angiopathy without gangrene: E11.51

## 2022-04-07 HISTORY — DX: Acute gastric ulcer without hemorrhage or perforation: K25.3

## 2022-04-07 HISTORY — DX: Peripheral vascular disease, unspecified: I73.9

## 2022-04-07 LAB — RETICULOCYTES
Immature Retic Fract: 35 % — ABNORMAL HIGH (ref 2.3–15.9)
RBC.: 4.45 MIL/uL (ref 3.87–5.11)
Retic Count, Absolute: 75.2 10*3/uL (ref 19.0–186.0)
Retic Ct Pct: 1.7 % (ref 0.4–3.1)

## 2022-04-07 LAB — CBC WITH DIFFERENTIAL/PLATELET
Abs Immature Granulocytes: 0.02 10*3/uL (ref 0.00–0.07)
Basophils Absolute: 0 10*3/uL (ref 0.0–0.1)
Basophils Relative: 0 %
Eosinophils Absolute: 0 10*3/uL (ref 0.0–0.5)
Eosinophils Relative: 0 %
HCT: 35.4 % — ABNORMAL LOW (ref 36.0–46.0)
Hemoglobin: 10.2 g/dL — ABNORMAL LOW (ref 12.0–15.0)
Immature Granulocytes: 0 %
Lymphocytes Relative: 17 %
Lymphs Abs: 1.3 10*3/uL (ref 0.7–4.0)
MCH: 23 pg — ABNORMAL LOW (ref 26.0–34.0)
MCHC: 28.8 g/dL — ABNORMAL LOW (ref 30.0–36.0)
MCV: 79.9 fL — ABNORMAL LOW (ref 80.0–100.0)
Monocytes Absolute: 0.4 10*3/uL (ref 0.1–1.0)
Monocytes Relative: 5 %
Neutro Abs: 5.8 10*3/uL (ref 1.7–7.7)
Neutrophils Relative %: 78 %
Platelets: 293 10*3/uL (ref 150–400)
RBC: 4.43 MIL/uL (ref 3.87–5.11)
RDW: 19.7 % — ABNORMAL HIGH (ref 11.5–15.5)
WBC: 7.4 10*3/uL (ref 4.0–10.5)
nRBC: 0 % (ref 0.0–0.2)

## 2022-04-07 LAB — HEPATIC FUNCTION PANEL
ALT: 16 U/L (ref 0–44)
AST: 26 U/L (ref 15–41)
Albumin: 3.5 g/dL (ref 3.5–5.0)
Alkaline Phosphatase: 93 U/L (ref 38–126)
Bilirubin, Direct: 0.2 mg/dL (ref 0.0–0.2)
Indirect Bilirubin: 0.4 mg/dL (ref 0.3–0.9)
Total Bilirubin: 0.6 mg/dL (ref 0.3–1.2)
Total Protein: 6.7 g/dL (ref 6.5–8.1)

## 2022-04-07 LAB — TROPONIN I (HIGH SENSITIVITY)
Troponin I (High Sensitivity): 23 ng/L — ABNORMAL HIGH (ref <18)
Troponin I (High Sensitivity): 24 ng/L — ABNORMAL HIGH (ref <18)
Troponin I (High Sensitivity): 23 ng/L — ABNORMAL HIGH (ref <18)
Troponin I (High Sensitivity): 23 ng/L — ABNORMAL HIGH (ref <18)

## 2022-04-07 LAB — BASIC METABOLIC PANEL
Anion gap: 7 (ref 5–15)
BUN: 40 mg/dL — ABNORMAL HIGH (ref 8–23)
CO2: 17 mmol/L — ABNORMAL LOW (ref 22–32)
Calcium: 8.4 mg/dL — ABNORMAL LOW (ref 8.9–10.3)
Chloride: 115 mmol/L — ABNORMAL HIGH (ref 98–111)
Creatinine, Ser: 2.38 mg/dL — ABNORMAL HIGH (ref 0.44–1.00)
GFR, Estimated: 21 mL/min — ABNORMAL LOW (ref >60)
Glucose, Bld: 65 mg/dL — ABNORMAL LOW (ref 70–99)
Potassium: 4.5 mmol/L (ref 3.5–5.1)
Sodium: 139 mmol/L (ref 135–145)

## 2022-04-07 LAB — CBG MONITORING, ED
Glucose-Capillary: 48 mg/dL — ABNORMAL LOW (ref 70–99)
Glucose-Capillary: 111 mg/dL — ABNORMAL HIGH (ref 70–99)
Glucose-Capillary: 100 mg/dL — ABNORMAL HIGH (ref 70–99)
Glucose-Capillary: 72 mg/dL (ref 70–99)
Glucose-Capillary: 64 mg/dL — ABNORMAL LOW (ref 70–99)
Glucose-Capillary: 133 mg/dL — ABNORMAL HIGH (ref 70–99)

## 2022-04-07 LAB — BLOOD GAS, VENOUS
Acid-base deficit: 7.4 mmol/L — ABNORMAL HIGH (ref 0.0–2.0)
Bicarbonate: 18.1 mmol/L — ABNORMAL LOW (ref 20.0–28.0)
O2 Saturation: 53.8 %
Patient temperature: 37
pCO2, Ven: 36 mmHg — ABNORMAL LOW (ref 44–60)
pH, Ven: 7.31 (ref 7.25–7.43)
pO2, Ven: 37 mmHg (ref 32–45)

## 2022-04-07 LAB — TYPE AND SCREEN

## 2022-04-07 LAB — LACTIC ACID, PLASMA
Lactic Acid, Venous: 0.9 mmol/L (ref 0.5–1.9)
Lactic Acid, Venous: 1 mmol/L (ref 0.5–1.9)

## 2022-04-07 LAB — T4, FREE: Free T4: 0.54 ng/dL — ABNORMAL LOW (ref 0.61–1.12)

## 2022-04-07 LAB — TSH: TSH: 8.62 u[IU]/mL — ABNORMAL HIGH (ref 0.350–4.500)

## 2022-04-07 MED ORDER — CLOPIDOGREL BISULFATE 75 MG PO TABS
75.0000 mg | ORAL_TABLET | Freq: Every day | ORAL | Status: DC
  Administered 2022-04-08 – 2022-04-09 (×2): 75 mg via ORAL
  Filled 2022-04-07 (×3): qty 1

## 2022-04-07 MED ORDER — ASPIRIN 81 MG PO TBEC
81.0000 mg | DELAYED_RELEASE_TABLET | Freq: Every day | ORAL | Status: DC
  Administered 2022-04-08 – 2022-04-09 (×2): 81 mg via ORAL
  Filled 2022-04-07 (×3): qty 1

## 2022-04-07 MED ORDER — ACETAMINOPHEN 325 MG PO TABS: 650.0000 mg | ORAL_TABLET | ORAL | Status: DC | PRN

## 2022-04-07 MED ORDER — DEXTROSE 5 % IV SOLN: Freq: Once | INTRAVENOUS | Status: DC

## 2022-04-07 MED ORDER — SODIUM CHLORIDE 0.9 % IV BOLUS
500.0000 mL | Freq: Once | INTRAVENOUS | Status: AC
  Administered 2022-04-07: 500 mL via INTRAVENOUS

## 2022-04-07 MED ORDER — LACTATED RINGERS IV SOLN: INTRAVENOUS | Status: DC

## 2022-04-07 MED ORDER — DEXTROSE 50 % IV SOLN: 1.0000 | Freq: Once | INTRAVENOUS | Status: AC

## 2022-04-07 MED ORDER — SODIUM BICARBONATE 650 MG PO TABS
650.0000 mg | ORAL_TABLET | Freq: Two times a day (BID) | ORAL | Status: DC
  Administered 2022-04-08: 650 mg via ORAL
  Filled 2022-04-07 (×4): qty 1

## 2022-04-07 MED ORDER — INSULIN ASPART 100 UNIT/ML IJ SOLN
0.0000 [IU] | Freq: Three times a day (TID) | INTRAMUSCULAR | Status: DC
  Administered 2022-04-08: 2 [IU] via SUBCUTANEOUS
  Filled 2022-04-07: qty 1

## 2022-04-07 MED ORDER — SODIUM CHLORIDE 0.9 % IV SOLN: INTRAVENOUS | Status: DC

## 2022-04-07 MED ORDER — ACETAMINOPHEN 650 MG RE SUPP: 650.0000 mg | RECTAL | Status: DC | PRN

## 2022-04-07 MED ORDER — DEXTROSE 50 % IV SOLN
INTRAVENOUS | Status: AC
  Administered 2022-04-07: 50 mL
  Filled 2022-04-07: qty 50

## 2022-04-07 MED ORDER — LEVOTHYROXINE SODIUM 50 MCG PO TABS
175.0000 ug | ORAL_TABLET | ORAL | Status: DC
  Administered 2022-04-08: 175 ug via ORAL
  Filled 2022-04-07: qty 1

## 2022-04-07 MED ORDER — DEXTROSE 5 % IV SOLN: Freq: Once | INTRAVENOUS | Status: AC

## 2022-04-07 MED ORDER — ACETAMINOPHEN 160 MG/5ML PO SOLN: 650.0000 mg | ORAL | Status: DC | PRN

## 2022-04-07 NOTE — ED Notes (Signed)
MD advised of CBG 48. D 50 given by RN Lanelle Bal. Pt tolerated well. Remains alert and oriented.

## 2022-04-07 NOTE — ED Triage Notes (Signed)
Ems report: pt found beside bed with CBG 31. Ems gave 25 gms D10 repeat CBG 117

## 2022-04-07 NOTE — Assessment & Plan Note (Signed)
We will obtain a free T4 and a TSH. We will continue patient on levothyroxine at 175 mcg.

## 2022-04-07 NOTE — ED Provider Triage Note (Signed)
  Emergency Medicine Provider Triage Evaluation Note  [name] , a 71 y.o.female,  was evaluated in triage.  Pt complains of syncope.  Patient states that she went unresponsive earlier this morning.  When EMS arrived, they gave her sugar, which brought her back to consciousness.  She states that she believes that she did not eat enough this morning.  Denies any pain or discomfort.  No recent illnesses or injuries.  Denies head injuries.   Review of Systems  Positive: Syncope Negative: Denies fever, chest pain, vomiting  Physical Exam  There were no vitals filed for this visit. Gen:   Awake, no distress   Resp:  Normal effort  MSK:   Moves extremities without difficulty  Other:    Medical Decision Making  Given the patient's initial medical screening exam, the following diagnostic evaluation has been ordered. The patient will be placed in the appropriate treatment space, once one is available, to complete the evaluation and treatment. I have discussed the plan of care with the patient and I have advised the patient that an ED physician or mid-level practitioner will reevaluate their condition after the test results have been received, as the results may give them additional insight into the type of treatment they may need.    Diagnostics: Labs, EKG, CBG  Treatments: none immediately   Teodoro Spray, [place] 04/07/22 1419

## 2022-04-07 NOTE — Assessment & Plan Note (Signed)
Stable. No chest pain or other acute issues.  --continue aspirin and Plavix.

## 2022-04-07 NOTE — Inpatient Diabetes Management (Signed)
Inpatient Diabetes Program Recommendations  AACE/ADA: New Consensus Statement on Inpatient Glycemic Control (2015)  Target Ranges:  Prepandial:   less than 140 mg/dL      Peak postprandial:   less than 180 mg/dL (1-2 hours)      Critically ill patients:  140 - 180 mg/dL   Lab Results  Component Value Date   GLUCAP 48 (L) 04/07/2022   HGBA1C 6.4 (H) 12/19/2021    Review of Glycemic Control  Latest Reference Range & Units 04/07/22 14:47  Glucose-Capillary 70 - 99 mg/dL 48 (L)  (L): Data is abnormally low  Diabetes history: DM2 Outpatient Diabetes medications: Amaryl 5 mg QD Current orders for Inpatient glycemic control: None  To ED with severe hypoglycemia at home.  Per EMS CBG was 31 mg/dL.  She lives alone.  She states she took her Amaryl this morning. She is current with Dr. Ouida Sills at [facility].  She does not check her blood glucose at home.  She states a glucometer is not covered by her insurance.  Recommend prescribing one at DC and if it is not covered she can purchase a ReliOn glucometer at [employer] for $20.  She should check her glucose daily (fasting and a few hrs after lunch and at bedtime).  She is interested in the [employer].  As of now Medicare will only cover a CGM if on insulin.  I can place a Freestyle Libre 2 or 3 on her at DC as I have some samples in our office.    Please consider discontinuing Amaryl at discharge  Discharge on Tradjenta (will not cause hypoglycemia) Follow up with PCP Glucometer order # [id number]  Will continue to follow while inpatient.  Thank you, Reche Dixon, MSN, [place] [facility] Coordinator Inpatient Diabetes Program [phone] (team pager from 8a-5p)

## 2022-04-07 NOTE — ED Notes (Signed)
Pt passed Modified NIHSS  without deficits noted.

## 2022-04-07 NOTE — Assessment & Plan Note (Addendum)
EKG today shows sinus rhythm 66 bpm with a PR of 50 that short, nonspecific interventricular conduction delay, anterolateral infarct with T wave inversions in V5 V6 and lead III, Q waves in lead I and aVL.  See i previous ekg in July 2023 showed SR with prolonged PR of 240.

## 2022-04-07 NOTE — Assessment & Plan Note (Addendum)
Most likely due to hypoglycemic (on sulfonylurea with CKD, mistakenly took extra).   Possibly also due to multiple antihypertensives. Lung perfusion study low probability for PE. Orthostatic vitals within normal. Resting BP's labile, soft at times No neurologic signs or deficits, MRI brain negative. --Monitored telemetry with no significant events --Held antihypertensives and overall BP was controlled, mildly elevated at times. --Resume only Lasix as discharge --Close PCP follow up to cautiously resume other antihypertensives, if indicated --Recommend home BP monitoring until follow up --Stop sulfonylurea & stop at discharge given CKD, high risk for hypoglycemic episodes (started [place])

## 2022-04-07 NOTE — H&P (Signed)
History and Physical    Chief Complaint: Altered mental status   HISTORY OF PRESENT ILLNESS: [name] is an 71 y.o. female seen in the emergency room brought by EMS patient was found on the side of her bed was found to be hypoglycemic with blood sugars in the 40s.  D50 was given by EMS patient remained alert coherent oriented.  Patient did have low blood sugars yesterday and she passed out and has not been eating well.  Patient is status post gastric bypass and treats her diabetes with Amaryl.  Patient does report pain in between her shoulder blades.   Chart reviewed and patient had an MRI of the brain in July showing question acute infarct and multiple remote cerebellar infarcts. Patient's last 2D echocardiogram was also done during that time which showed: 1. Left ventricular ejection fraction, by estimation, is 30 to 35%. The  left ventricle has moderately decreased function. The left ventricle  demonstrates global hypokinesis. There is moderate asymmetric left  ventricular hypertrophy of the basal-septal  segment. Left ventricular diastolic parameters are consistent with Grade I  diastolic dysfunction (impaired relaxation).   2. Right ventricular systolic function is normal. The right ventricular  size is normal. Tricuspid regurgitation signal is inadequate for assessing  PA pressure.   3. Left atrial size was moderately dilated.   4. The mitral valve is normal in structure. Mild mitral valve  regurgitation. No evidence of mitral stenosis.   5. The aortic valve has an indeterminant number of cusps. Aortic valve  regurgitation is not visualized. No aortic stenosis is present.   6. There is borderline dilatation of the ascending aorta, measuring 37  mm.   7. The inferior vena cava is normal in size with greater than 50%  respiratory variability, suggesting right atrial pressure of 3 mmHg.   Pt has PMH as below: Past Medical History:  Diagnosis Date   Acute CVA  (cerebrovascular accident) ([place]) 12/18/2021   Acute gastric ulcer without hemorrhage or perforation    Acute on chronic systolic CHF (congestive heart failure), NYHA class 3 ([place]) 08/03/2019   Formatting of this note might be different from the original. Global ef 35%   Anemia    Anemia, iron deficiency 11/07/2013   Arthritis    CAD (coronary artery disease)    Cerebrovascular accident [facility])    CHF (congestive heart failure) ([place])    Chicken pox    Chronic kidney disease    Colitis    ? 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  Musculoskeletal:  Positive for back pain.  Neurological:  Positive for syncope and light-headedness.  All other systems reviewed and are negative.    Allergies  Allergen Reactions   Niacin Other (See  Comments)    Heart Attack   Ultram [Tramadol Hcl]     Intolerance, makes woozy   Baclofen Other (See Comments)    Dizziness   Cardura [Doxazosin Mesylate] Other (See Comments)    Dizzy with passing out spells   Morphine And Related Nausea And Vomiting   Other Rash    Gold metal (nickel in the gold)     Past Surgical History:  Procedure Laterality Date   ABDOMINAL HYSTERECTOMY     CHOLECYSTECTOMY     COLONOSCOPY WITH PROPOFOL N/A 12/24/2014   Procedure: COLONOSCOPY WITH  PROPOFOL;  Surgeon: Hulen Luster, MD;  Location: [facility] ENDOSCOPY;  Service: Gastroenterology;  Laterality: N/A;   CORONARY ARTERY BYPASS GRAFT     ESOPHAGOGASTRODUODENOSCOPY N/A 09/02/2017   Procedure: ESOPHAGOGASTRODUODENOSCOPY (EGD);  Surgeon: Virgel Manifold, MD;  Location: [facility] ENDOSCOPY;  Service: Endoscopy;  Laterality: N/A;   ESOPHAGOGASTRODUODENOSCOPY (EGD) WITH PROPOFOL N/A 12/24/2014   Procedure: ESOPHAGOGASTRODUODENOSCOPY (EGD) WITH PROPOFOL;  Surgeon: Hulen Luster, MD;  Location: [facility] ENDOSCOPY;  Service: Gastroenterology;  Laterality: N/A;   JOINT REPLACEMENT     LOWER EXTREMITY ANGIOGRAPHY Left 07/17/2019   Procedure: Lower Extremity Angiography;  Surgeon: Algernon Huxley, MD;  Location: [facility];  Service: Cardiovascular;  Laterality: Left;   ROUX-EN-Y GASTRIC BYPASS     loss over 100 pounds   TONSILLECTOMY     TOTAL KNEE ARTHROPLASTY Left       Social History   Socioeconomic History   Marital status: Single    Spouse name: Not on file   Number of children: 0   Years of education: Not on file   Highest education level: Not on file  Occupational History   Occupation: customer service    Employer: [employer]  Tobacco Use   Smoking status: Never   Smokeless tobacco: Never  Vaping Use   Vaping Use: Never used  Substance and Sexual Activity   Alcohol use: No   Drug use: No   Sexual activity: Not on file  Other Topics Concern   Not on file  Social History Narrative   Single, no children   Customer service   1 caffeine/day   Social Determinants of Health   Financial Resource Strain: Not on file  Food Insecurity: No Food Insecurity (04/07/2022)   Hunger Vital Sign    Worried About Running Out of Food in the Last Year: Never true    Ran Out of Food in the Last Year: Never true  Transportation Needs: Not on file  Physical Activity: Not on file  Stress: Not on file  Social Connections: Not on file      CURRENT MEDS:  Current Facility-Administered  Medications (Endocrine & Metabolic):    [START ON 77/01/2422] insulin aspart (novoLOG) injection 0-15 Units   levothyroxine (SYNTHROID) tablet 175 mcg  Current Outpatient Medications (Endocrine & Metabolic):    glimepiride (AMARYL) 1 MG tablet, Take 1 tablet by mouth daily.   levothyroxine (SYNTHROID) 175 MCG tablet, Take 175 mcg by mouth every other day.   Current Outpatient Medications (Cardiovascular):    amLODipine (NORVASC) 5 MG tablet, Take 5 mg by mouth daily.   benazepril (LOTENSIN) 40 MG tablet, TAKE ONE TABLET BY MOUTH ONCE DAILY (Patient taking differently: Take 40 mg by mouth daily.)   cloNIDine (CATAPRES) 0.1 MG tablet, Take 0.1 mg by mouth 2 (two) times daily.   hydrALAZINE (APRESOLINE) 50 MG tablet, Take 50 mg by mouth 3 (three) times daily.   lovastatin (MEVACOR) 40  MG tablet, Take 40 mg by mouth daily.   Current Outpatient Medications (Respiratory):    diphenhydrAMINE (BENADRYL) 50 MG capsule, Take 50 mg by mouth at bedtime as needed for sleep (Takes Benadryl 50 mg with 1 tab Tylenol PM qHS as needed).  Current Facility-Administered Medications (Analgesics):    acetaminophen (TYLENOL) tablet 650 mg **OR** acetaminophen (TYLENOL) 160 MG/5ML solution 650 mg **OR** acetaminophen (TYLENOL) suppository 650 mg   aspirin EC tablet 81 mg  Current Outpatient Medications (Analgesics):    aspirin EC 81 MG tablet, Take 81 mg by mouth daily.   Current Facility-Administered Medications (Hematological):    clopidogrel (PLAVIX) tablet 75 mg  Current Outpatient Medications (Hematological):    clopidogrel (PLAVIX) 75 MG tablet, Take 1 tablet (75 mg total) by mouth daily.  Current Facility-Administered Medications (Other):    0.9 %  sodium chloride infusion   dextrose 5 % solution   lactated ringers infusion   sodium bicarbonate tablet 650 mg  Current Outpatient Medications (Other):    Multiple Vitamin (MULTIVITAMIN WITH MINERALS) TABS tablet, Take 1 tablet by mouth daily.    pantoprazole (PROTONIX) 20 MG tablet, Take 20 mg by mouth daily.   sodium bicarbonate 650 MG tablet, Take 1 tablet (650 mg total) by mouth 2 (two) times daily.    ED Course: Pt in Ed patient is alert awake oriented but is a poor historian is not able to tell me the medications that she is taking.  Does not have any family members and has a friend Mr. Ruthann Cancer. Vitals:   04/07/22 1745 04/07/22 1853 04/07/22 1900 04/07/22 1930  BP:   122/63 (!) 132/57  Pulse:   67 66  Resp:      Temp: 98.1 F (36.7 C)  98 F (36.7 C)   TempSrc: Oral  Oral   SpO2:  100% 100% 100%  Weight:      Height:       No intake/output data recorded. SpO2: 100 % Blood work in ed shows  Results for orders placed or performed during the hospital encounter of 04/07/22 (from the past 48 hour(s))  POC CBG, ED     Status: Abnormal   Collection Time: 04/07/22  2:47 PM  Result Value Ref Range   Glucose-Capillary 48 (L) 70 - 99 mg/dL    Comment: Glucose reference range applies only to samples taken after fasting for at least 8 hours.  CBC with Differential     Status: Abnormal   Collection Time: 04/07/22  3:10 PM  Result Value Ref Range   WBC 7.4 4.0 - 10.5 K/uL   RBC 4.43 3.87 - 5.11 MIL/uL   Hemoglobin 10.2 (L) 12.0 - 15.0 g/dL   HCT 35.4 (L) 36.0 - 46.0 %   MCV 79.9 (L) 80.0 - 100.0 fL   MCH 23.0 (L) 26.0 - 34.0 pg   MCHC 28.8 (L) 30.0 - 36.0 g/dL   RDW 19.7 (H) 11.5 - 15.5 %   Platelets 293 150 - 400 K/uL   nRBC 0.0 0.0 - 0.2 %   Neutrophils Relative % 78 %   Neutro Abs 5.8 1.7 - 7.7 K/uL   Lymphocytes Relative 17 %   Lymphs Abs 1.3 0.7 - 4.0 K/uL   Monocytes Relative 5 %   Monocytes Absolute 0.4 0.1 - 1.0 K/uL   Eosinophils Relative 0 %   Eosinophils Absolute 0.0 0.0 - 0.5 K/uL   Basophils Relative 0 %   Basophils Absolute 0.0 0.0 - 0.1 K/uL   Immature  Granulocytes 0 %   Abs Immature Granulocytes 0.02 0.00 - 0.07 K/uL    Comment: Performed at [facility], [address] [place],  [place] [address]  Basic metabolic panel     Status: Abnormal   Collection Time: 04/07/22  3:10 PM  Result Value Ref Range   Sodium 139 135 - 145 mmol/L   Potassium 4.5 3.5 - 5.1 mmol/L   Chloride 115 (H) 98 - 111 mmol/L   CO2 17 (L) 22 - 32 mmol/L   Glucose, Bld 65 (L) 70 - 99 mg/dL    Comment: Glucose reference range applies only to samples taken after fasting for at least 8 hours.   BUN 40 (H) 8 - 23 mg/dL   Creatinine, Ser 2.38 (H) 0.44 - 1.00 mg/dL   Calcium 8.4 (L) 8.9 - 10.3 mg/dL   GFR, Estimated 21 (L) >60 mL/min    Comment: (NOTE) Calculated using the CKD-EPI Creatinine Equation (2021)    Anion gap 7 5 - 15    Comment: Performed at [facility], [address] [place], [place] [address]  Hepatic function panel     Status: None   Collection Time: 04/07/22  3:10 PM  Result Value Ref Range   Total Protein 6.7 6.5 - 8.1 g/dL   Albumin 3.5 3.5 - 5.0 g/dL   AST 26 15 - 41 U/L   ALT 16 0 - 44 U/L   Alkaline Phosphatase 93 38 - 126 U/L   Total Bilirubin 0.6 0.3 - 1.2 mg/dL   Bilirubin, Direct 0.2 0.0 - 0.2 mg/dL   Indirect Bilirubin 0.4 0.3 - 0.9 mg/dL    Comment: Performed at [facility], [place], [place] [address]  Troponin I (High Sensitivity)     Status: Abnormal   Collection Time: 04/07/22  3:10 PM  Result Value Ref Range   Troponin I (High Sensitivity) 23 (H) <18 ng/L    Comment: (NOTE) Elevated high sensitivity troponin I (hsTnI) values and significant  changes across serial measurements may suggest ACS but many other  chronic and acute conditions are known to elevate hsTnI results.  Refer to the "Links" section for chest pain algorithms and additional  guidance. Performed at [facility], [address] [place], [place] [address]   CBG monitoring, ED     Status: Abnormal   Collection Time: 04/07/22  3:41 PM  Result Value Ref Range   Glucose-Capillary 111 (H) 70 - 99 mg/dL    Comment: Glucose reference range applies only  to samples taken after fasting for at least 8 hours.  CBG monitoring, ED     Status: Abnormal   Collection Time: 04/07/22  4:31 PM  Result Value Ref Range   Glucose-Capillary 100 (H) 70 - 99 mg/dL    Comment: Glucose reference range applies only to samples taken after fasting for at least 8 hours.  CBG monitoring, ED     Status: None   Collection Time: 04/07/22  5:28 PM  Result Value Ref Range   Glucose-Capillary 72 70 - 99 mg/dL    Comment: Glucose reference range applies only to samples taken after fasting for at least 8 hours.  Troponin I (High Sensitivity)     Status: Abnormal   Collection Time: 04/07/22  5:29 PM  Result Value Ref Range   Troponin I (High Sensitivity) 24 (H) <18 ng/L    Comment: (NOTE) Elevated high sensitivity troponin I (hsTnI) values and significant  changes across serial measurements  may suggest ACS but many other  chronic and acute conditions are known to elevate hsTnI results.  Refer to the "Links" section for chest pain algorithms and additional  guidance. Performed at [facility], [address] [place], [place] [address]   CBG monitoring, ED     Status: Abnormal   Collection Time: 04/07/22  6:20 PM  Result Value Ref Range   Glucose-Capillary 64 (L) 70 - 99 mg/dL    Comment: Glucose reference range applies only to samples taken after fasting for at least 8 hours.  Blood gas, venous     Status: Abnormal   Collection Time: 04/07/22  6:51 PM  Result Value Ref Range   pH, Ven 7.31 7.25 - 7.43   pCO2, Ven 36 (L) 44 - 60 mmHg   pO2, Ven 37 32 - 45 mmHg   Bicarbonate 18.1 (L) 20.0 - 28.0 mmol/L   Acid-base deficit 7.4 (H) 0.0 - 2.0 mmol/L   O2 Saturation 53.8 %   Patient temperature 37.0    Collection site VEIN     Comment: Performed at [facility], [address] [place], [place] [address]    In Ed pt received  Meds ordered this encounter  Medications   dextrose 50 % solution    Medlin, Erika L: cabinet override   dextrose  50 % solution 50 mL   sodium chloride 0.9 % bolus 500 mL   dextrose 5 % solution   dextrose 5 % solution   aspirin EC tablet 81 mg   clopidogrel (PLAVIX) tablet 75 mg   levothyroxine (SYNTHROID) tablet 175 mcg   sodium bicarbonate tablet 650 mg   lactated ringers infusion   0.9 %  sodium chloride infusion   OR Linked Order Group    acetaminophen (TYLENOL) tablet 650 mg    acetaminophen (TYLENOL) 160 MG/5ML solution 650 mg    acetaminophen (TYLENOL) suppository 650 mg   insulin aspart (novoLOG) injection 0-15 Units    Order Specific Question:   Correction coverage:    Answer:   Moderate (average weight, post-op)    Order Specific Question:   CBG < 70:    Answer:   implement hypoglycemia protocol    Order Specific Question:   CBG 70 - 120:    Answer:   0 units    Order Specific Question:   CBG 121 - 150:    Answer:   2 units    Order Specific Question:   CBG 151 - 200:    Answer:   3 units    Order Specific Question:   CBG 201 - 250:    Answer:   5 units    Order Specific Question:   CBG 251 - 300:    Answer:   8 units    Order Specific Question:   CBG 301 - 350:    Answer:   11 units    Order Specific Question:   CBG 351 - 400:    Answer:   15 units    Order Specific Question:   CBG > 400    Answer:   call MD and obtain STAT lab verification    Unresulted Labs (From admission, onward)     Start     Ordered   04/08/22 0500  Lipid panel  (Labs)  Tomorrow morning,   URGENT       Comments: Fasting    04/07/22 1940   04/07/22 1939  Type and screen  Once,   STAT  04/07/22 1940   04/07/22 1939  Urine rapid drug screen (hosp performed)not at [facility])  Once,   STAT        04/07/22 1940   04/07/22 1850  Lactic acid, plasma  STAT Now then every 3 hours,   STAT      04/07/22 1850             Admission Imaging : CT Head Wo Contrast  Result Date: 04/07/2022 CLINICAL DATA:  Head trauma, minor (Age >= 65y); Neck trauma (Age >= 65y). Fall yesterday. EXAM: CT HEAD  WITHOUT CONTRAST CT CERVICAL SPINE WITHOUT CONTRAST TECHNIQUE: Multidetector CT imaging of the head and cervical spine was performed following the standard protocol without intravenous contrast. Multiplanar CT image reconstructions of the cervical spine were also generated. RADIATION DOSE REDUCTION: This exam was performed according to the departmental dose-optimization program which includes automated exposure control, adjustment of the mA and/or kV according to patient size and/or use of iterative reconstruction technique. COMPARISON:  Head CT 12/17/2021.  Head MRI 12/18/2021. FINDINGS: CT HEAD FINDINGS Brain: There is no evidence of an acute infarct, intracranial hemorrhage, mass, midline shift, or extra-axial fluid collection. A small chronic infarct is again noted inferiorly in the right cerebellar hemisphere. Mild cerebral atrophy is within normal limits for age. Cerebral white matter hypodensities are nonspecific but compatible with mild chronic small vessel ischemic disease. Vascular: Calcified atherosclerosis at the skull base. No hyperdense vessel. Skull: No acute fracture or suspicious osseous lesion. Sinuses/Orbits: Paranasal sinuses and mastoid air cells are clear. Unremarkable orbits. Other: None. CT CERVICAL SPINE FINDINGS Alignment: Trace retrolisthesis of C3 on C4 and C4 on C5 and trace anterolisthesis of C7 on T1. Skull base and vertebrae: No acute fracture or suspicious osseous lesion. Soft tissues and spinal canal: No prevertebral fluid or swelling. No visible canal hematoma. Disc levels: Moderately advanced disc space narrowing and degenerative endplate changes at T5-5 with milder disc degeneration at the other cervical and upper thoracic levels. Advanced facet arthrosis at C7-T1. Bilateral facet ankylosis from T1-T3. Multilevel neural foraminal stenosis due to uncovertebral spurring, moderate on the right at C4-5. Mild-to-moderate spinal stenosis at C3-4 and C4-5 due to central disc protrusions  and infolding of the ligamentum flavum. Upper chest: No apical lung consolidation or mass. Other: Prominent calcific atherosclerosis at the carotid bifurcations. IMPRESSION: No evidence of acute intracranial abnormality or cervical spine fracture. Electronically Signed   By: Logan Bores M.D.   On: 04/07/2022 15:45   CT Cervical Spine Wo Contrast  Result Date: 04/07/2022 CLINICAL DATA:  Head trauma, minor (Age >= 65y); Neck trauma (Age >= 65y). Fall yesterday. EXAM: CT HEAD WITHOUT CONTRAST CT CERVICAL SPINE WITHOUT CONTRAST TECHNIQUE: Multidetector CT imaging of the head and cervical spine was performed following the standard protocol without intravenous contrast. Multiplanar CT image reconstructions of the cervical spine were also generated. RADIATION DOSE REDUCTION: This exam was performed according to the departmental dose-optimization program which includes automated exposure control, adjustment of the mA and/or kV according to patient size and/or use of iterative reconstruction technique. COMPARISON:  Head CT 12/17/2021.  Head MRI 12/18/2021. FINDINGS: CT HEAD FINDINGS Brain: There is no evidence of an acute infarct, intracranial hemorrhage, mass, midline shift, or extra-axial fluid collection. A small chronic infarct is again noted inferiorly in the right cerebellar hemisphere. Mild cerebral atrophy is within normal limits for age. Cerebral white matter hypodensities are nonspecific but compatible with mild chronic small vessel ischemic disease. Vascular: Calcified atherosclerosis at the skull  base. No hyperdense vessel. Skull: No acute fracture or suspicious osseous lesion. Sinuses/Orbits: Paranasal sinuses and mastoid air cells are clear. Unremarkable orbits. Other: None. CT CERVICAL SPINE FINDINGS Alignment: Trace retrolisthesis of C3 on C4 and C4 on C5 and trace anterolisthesis of C7 on T1. Skull base and vertebrae: No acute fracture or suspicious osseous lesion. Soft tissues and spinal canal: No  prevertebral fluid or swelling. No visible canal hematoma. Disc levels: Moderately advanced disc space narrowing and degenerative endplate changes at X4-4 with milder disc degeneration at the other cervical and upper thoracic levels. Advanced facet arthrosis at C7-T1. Bilateral facet ankylosis from T1-T3. Multilevel neural foraminal stenosis due to uncovertebral spurring, moderate on the right at C4-5. Mild-to-moderate spinal stenosis at C3-4 and C4-5 due to central disc protrusions and infolding of the ligamentum flavum. Upper chest: No apical lung consolidation or mass. Other: Prominent calcific atherosclerosis at the carotid bifurcations. IMPRESSION: No evidence of acute intracranial abnormality or cervical spine fracture. Electronically Signed   By: Logan Bores M.D.   On: 04/07/2022 15:45   DG Chest 2 View  Result Date: 04/07/2022 CLINICAL DATA:  Weakness and falls. EXAM: CHEST - 2 VIEW COMPARISON:  06/30/2019 FINDINGS: Surgical changes again noted from cardiac surgery. The heart is mildly enlarged but stable. Stable tortuosity and calcification of the thoracic aorta. Stable moderate eventration of the right hemidiaphragm but no acute pulmonary process. No pleural effusions or pulmonary lesions. The bony thorax is intact. IMPRESSION: No acute cardiopulmonary findings. Electronically Signed   By: Marijo Sanes M.D.   On: 04/07/2022 15:38      Physical Examination: Vitals:   04/07/22 1745 04/07/22 1853 04/07/22 1900 04/07/22 1930  BP:   122/63 (!) 132/57  Pulse:   67 66  Temp: 98.1 F (36.7 C)  98 F (36.7 C)   Resp:      Height:      Weight:      SpO2:  100% 100% 100%  TempSrc: Oral  Oral   BMI (Calculated):       Physical Exam Vitals and nursing note reviewed.  Constitutional:      General: She is not in acute distress.    Appearance: Normal appearance. She is not ill-appearing, toxic-appearing or diaphoretic.  HENT:     Head: Normocephalic and atraumatic.     Right Ear: Hearing  and external ear normal.     Left Ear: Hearing and external ear normal.     Nose: Nose normal. No nasal deformity.     Mouth/Throat:     Lips: Pink.     Mouth: Mucous membranes are moist.     Tongue: No lesions.     Pharynx: Oropharynx is clear.  Eyes:     Extraocular Movements: Extraocular movements intact.     Pupils: Pupils are equal, round, and reactive to light.  Cardiovascular:     Rate and Rhythm: Normal rate and regular rhythm.     Pulses: Normal pulses.     Heart sounds: Normal heart sounds.  Pulmonary:     Effort: Pulmonary effort is normal.     Breath sounds: Normal breath sounds.  Abdominal:     General: Bowel sounds are normal. There is no distension.     Palpations: Abdomen is soft. There is no mass.     Tenderness: There is no abdominal tenderness. There is no guarding.     Hernia: No hernia is present.  Musculoskeletal:     Right lower leg: No edema.  Left lower leg: No edema.  Skin:    General: Skin is warm.  Neurological:     General: No focal deficit present.     Mental Status: She is alert and oriented to person, place, and time.     Cranial Nerves: Cranial nerves 2-12 are intact.     Motor: Motor function is intact.  Psychiatric:        Attention and Perception: Attention normal.        Mood and Affect: Mood normal.        Speech: Speech normal.        Behavior: Behavior normal. Behavior is cooperative.        Cognition and Memory: Cognition normal.     Assessment and Plan: * Syncope and collapse Differentials in this patient include cardiac whether its ischemia or dysrhythmia, neurological if this is another TIA or CVA, or pulmonary if this is syncope and collapse related to pulmonary embolism, or orthostatic hypotension causing complications or dysrhythmias. We will : Orthostatic vitals. MRI of the brain. Repeat EKG and follow. VQ scan. Telemetry monitoring. Consider echo with bubble study and cardiology consult if above work-up is  negative. We will continue patient on her aspirin, Plavix, statin therapy. We will prevent hypotension.   CAD, ARTERY BYPASS GRAFT Patient denies any chest pain pressure or anginal symptoms shortness of breath dyspnea on exertion orthopnea. Again we will continue patient on her aspirin and Plavix.   Hypothyroidism We will obtain a free T4 and a TSH. We will continue patient on levothyroxine at 175 mcg.   Abnormal EKG EKG today shows sinus rhythm 66 bpm with a PR of 50 that short, nonspecific interventricular conduction delay, anterolateral infarct with T wave inversions in V5 V6 and lead III, Q waves in lead I and aVL.  See i previous ekg in July 2023 showed SR with prolonged PR of 240.             Hypoglycemia Continue patient on D5 overnight.  Type 2 diabetes mellitus without complication (HCC) Sliding scale coverage. Hold home regimen with insulin and Amaryl.  Chronic kidney disease, stage III (moderate) (HCC) We will hold any ACE or ARB therapy. We will avoid contrast evaluation. We will renally dose all needed medications.   Anemia Patient denies any blood in her stools or melena but does report diarrhea because she does not eat a lot of protein. Patient has had a colonoscopy in the past and chart review does show heme positive stools as part of the diagnosis that patient was not aware of and patient does have anemia. Patient states that she has been anemic all her life and her hemoglobin currently is stable we will defer to primary care to further investigate and manage this.  Type and screen IV PPI therapy and stop antiplatelet therapy in the event hemoglobin drops further or patient becomes thrombocytopenic or is at risk for bleeding in any way.  HYPERTENSION, BENIGN We will hold patient's amlodipine and benazepril and clonidine and hydralazine.  Until orthostatic vitals.  Restart patient's hydralazine and clonidine to prevent hypertensive crisis.  Resume  other home meds as deemed appropriate.    DVT prophylaxis:  SCDs  Code Status:  Full Code    Family Communication:  Dymmel,Marie (Friend)  [phone] (Mobile)   Disposition Plan:  Home    Consults called:  none  Admission status: Inpatient.    Unit/ Expected LOS: Med tele / 2-3 days.    Para Skeans MD  [facility]  6 PM- 2 AM. Please contact me via secure Chat 6 PM-2 AM. [phone] ( Pager ) To contact the [facility] Attending or Consulting provider [place] or covering provider during after hours [place], for this patient.   Check the care team in [facility] and look for a) attending/consulting [facility] provider listed and b) the [facility] team listed Log into [email or web address] and use [place]'s universal password to access. If you do not have the password, please contact the hospital operator. Locate the [facility] provider you are looking for under [facility] and page to a number that you can be directly reached. If you still have difficulty reaching the provider, please page the [facility] (Director on Call) for the Hospitalists listed on amion for assistance. [email or web address] 04/07/2022, 7:52 PM

## 2022-04-07 NOTE — ED Provider Notes (Signed)
[facility] Provider Note    Event Date/Time   First MD Initiated Contact with Patient 04/07/22 1455     (approximate)   History   Hypoglycemia   HPI  [name] is a 71 y.o. 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  She reports she is lost weight from 315 pounds down to her current much lower weight.  She is not eating much but still taking her Amaryl 5 mg p.o.  She passed out yesterday and again today.  Blood sugar today was 48.  She is now awake alert oriented x3 denies any headache or neck pain or chest pain belly pain extremity pain.  She says she has a little bit of ache between her shoulder blades from falling yesterday.      Physical Exam   Triage Vital Signs: ED Triage Vitals  Enc Vitals Group     BP --      Pulse Rate 04/07/22 1453 68     Resp 04/07/22 1453 18     Temp 04/07/22 1453 (!) 97.5 F (36.4 C)     Temp Source 04/07/22 1453 Oral     SpO2 04/07/22 1453 100 %     Weight 04/07/22 1451 188 lb 15 oz (85.7 kg)     Height 04/07/22 1451 5\' 6"  (1.676 m)     Head Circumference --      Peak Flow --      Pain Score --      Pain Loc --      Pain Edu? --      Excl. in [place]? --     Most recent vital signs: Vitals:   04/07/22 1500 04/07/22 1745  BP: (!) 123/94   Pulse: 65   Resp: 18   Temp:  98.1 F (36.7 C)  SpO2: 100%      General: Awake, no distress.  Head normocephalic atraumatic Neck is supple and nontender Back there is a small amount of diffuse tenderness between her shoulder blades otherwise nontender CV:  Good peripheral perfusion.  Heart regular rate and rhythm no audible murmurs Resp:  Normal effort.  Lungs are clear with good air movement Abd:  No distention.  Soft and nontender Extremities nontender Neuro exam cranial nerves II through XII are intact except visual fields were not checked cerebellar finger-nose heel-to-shin and rapid alternating movements and hands are normal  motor strength is 5/5 throughout patient does not report any numbness   ED Results / Procedures / Treatments   Labs (all labs ordered are listed, but only abnormal results are displayed) Labs Reviewed  CBC WITH DIFFERENTIAL/PLATELET - Abnormal; Notable for the following components:      Result Value   Hemoglobin 10.2 (*)    HCT 35.4 (*)    MCV 79.9 (*)    MCH 23.0 (*)    MCHC 28.8 (*)    RDW 19.7 (*)    All other components within normal limits  BASIC METABOLIC PANEL - Abnormal; Notable for the following components:   Chloride 115 (*)    CO2 17 (*)    Glucose, Bld 65 (*)    BUN 40 (*)    Creatinine, Ser 2.38 (*)    Calcium 8.4 (*)    GFR, Estimated 21 (*)    All other components within normal limits  CBG MONITORING, ED - Abnormal; Notable for the following components:   Glucose-Capillary 48 (*)    All  other components within normal limits  CBG MONITORING, ED - Abnormal; Notable for the following components:   Glucose-Capillary 111 (*)    All other components within normal limits  CBG MONITORING, ED - Abnormal; Notable for the following components:   Glucose-Capillary 100 (*)    All other components within normal limits  CBG MONITORING, ED - Abnormal; Notable for the following components:   Glucose-Capillary 64 (*)    All other components within normal limits  TROPONIN I (HIGH SENSITIVITY) - Abnormal; Notable for the following components:   Troponin I (High Sensitivity) 24 (*)    All other components within normal limits  HEPATIC FUNCTION PANEL  CBG MONITORING, ED  TROPONIN I (HIGH SENSITIVITY)     EKG  EKG read and interpreted by me shows normal sinus rhythm at a rate of 66 extreme right axis some loss of amplitude in the V leads`.  There is a flipped T in lead III which is not present before.  Otherwise the EKG looks similar to July 2023   RADIOLOGY Head CT and neck CT read by radiology reviewed and interpreted by me show no acute disease AP and lateral chest  x-ray read by radiology reviewed and interpreted by me show no acute disease  PROCEDURES:  Critical Care performed:   Procedures   MEDICATIONS ORDERED IN ED: Medications  dextrose 5 % solution (has no administration in time range)  dextrose 5 % solution (has no administration in time range)  dextrose 50 % solution 50 mL (50 mLs Intravenous Given 04/07/22 1510)  sodium chloride 0.9 % bolus 500 mL (500 mLs Intravenous New Bag/Given 04/07/22 1530)     IMPRESSION / MDM / [place] / ED COURSE  I reviewed the triage vital signs and the nursing notes. ----------------------------------------- 5:15 PM on 04/07/2022 ----------------------------------------- Patient also seen by diabetic teaching nurse.  We both feel that her syncope is likely due to her low blood sugar which is likely due to her still taking her Amaryl 5 mg daily when her weight has come down dramatically and her p.o. intake is much less.  We will cut the Amaryl down to 1 mg a day and have her check her fingersticks before meals and before bed for few days.  Depending on what that looks like we may have her increase it to 2 mg once a day.  I will have her follow-up with her regular physician.  Differential diagnosis includes, but is not limited to, syncope from cardiac causes, syncope from sepsis, syncope from hypoglycemia which seems to be the correct diagnosis.  Patient is still taking 5 mg of Amaryl apparently it is too much.  Her sugar is still dropping now at 630.  We will start some D5 and get her in the hospital overnight to watch her.  Patient's presentation is most consistent with acute presentation with potential threat to life or bodily function.  The patient is on the cardiac monitor to evaluate for evidence of arrhythmia and/or significant heart rate changes.  None have been seen      FINAL CLINICAL IMPRESSION(S) / ED DIAGNOSES   Final diagnoses:  Hypoglycemia     Rx / DC Orders   ED  Discharge Orders     None        Note:  This document was prepared using Dragon voice recognition software and may include unintentional dictation errors.   Nena Polio, MD 04/07/22 [phone]

## 2022-04-07 NOTE — ED Notes (Signed)
   04/07/22 1853  Orthostatic Sitting  BP- Sitting 129/74  Pulse- Sitting 72  Orthostatic Standing at 0 minutes  BP- Standing at 0 minutes 110/75  Pulse- Standing at 0 minutes 81  Orthostatic Standing at 3 minutes  BP- Standing at 3 minutes 114/67  Pulse- Standing at 3 minutes 85

## 2022-04-07 NOTE — Discharge Instructions (Signed)
I think your blood sugar is low because of your decreased weight and the fact that you are not eating that much anymore.  I want you to take the

## 2022-04-07 NOTE — ED Triage Notes (Addendum)
Pt here via [facility] with a blood sugar of 48. Pt does not take insulin, pt states she did not take any meds this morning. Pt states she has not been eating well lately. Pt states she fell yesterday but does not remember what caused the fall.

## 2022-04-07 NOTE — Assessment & Plan Note (Signed)
We will hold any ACE or ARB therapy. We will avoid contrast evaluation. We will renally dose all needed medications.

## 2022-04-07 NOTE — Assessment & Plan Note (Signed)
We will hold patient's amlodipine and benazepril and clonidine and hydralazine.  Until orthostatic vitals.  Restart patient's hydralazine and clonidine to prevent hypertensive crisis.  Resume other home meds as deemed appropriate.

## 2022-04-07 NOTE — Assessment & Plan Note (Addendum)
Sliding scale coverage. Hold home regimen with insulin and Amaryl. Stop Amaryl at discharge. Started Tradjenta.   Close PCP follow up.

## 2022-04-07 NOTE — Assessment & Plan Note (Addendum)
Treated with IV D5w fluids. Stop dextrose fluids and continue to monitor CBG's closely.  Hypoglycemia protocol.  Stop sulfonylurea to prevent recurrence, given CKD and reduced clearance.

## 2022-04-07 NOTE — ED Notes (Signed)
Pt transferred via stretcher to room 32 with Dawson Bills RN

## 2022-04-07 NOTE — ED Notes (Signed)
Patient transported to CT 

## 2022-04-07 NOTE — Assessment & Plan Note (Addendum)
Pt denies any signs of blood in stool.   Had prior colonoscopy.  Pt reported being anemic all her life.   Hbg stable.  --Monitor CBC. --Defer evaluation to outpatient PCP.

## 2022-04-08 ENCOUNTER — Inpatient Hospital Stay: Payer: Medicare Other

## 2022-04-08 ENCOUNTER — Encounter: Payer: Self-pay | Admitting: Internal Medicine

## 2022-04-08 DIAGNOSIS — R55 Syncope and collapse: Secondary | ICD-10-CM | POA: Diagnosis not present

## 2022-04-08 LAB — CBG MONITORING, ED
Glucose-Capillary: 65 mg/dL — ABNORMAL LOW (ref 70–99)
Glucose-Capillary: 147 mg/dL — ABNORMAL HIGH (ref 70–99)
Glucose-Capillary: 85 mg/dL (ref 70–99)
Glucose-Capillary: 146 mg/dL — ABNORMAL HIGH (ref 70–99)
Glucose-Capillary: 103 mg/dL — ABNORMAL HIGH (ref 70–99)

## 2022-04-08 LAB — GLUCOSE, CAPILLARY: Glucose-Capillary: 102 mg/dL — ABNORMAL HIGH (ref 70–99)

## 2022-04-08 MED ORDER — TECHNETIUM TO 99M ALBUMIN AGGREGATED
4.2100 | Freq: Once | INTRAVENOUS | Status: AC | PRN
  Administered 2022-04-08: 4.21 via INTRAVENOUS

## 2022-04-08 NOTE — ED Notes (Signed)
Informed RN bed assigned 

## 2022-04-08 NOTE — Inpatient Diabetes Management (Signed)
Inpatient Diabetes Program Recommendations  AACE/ADA: New Consensus Statement on Inpatient Glycemic Control (2015)  Target Ranges:  Prepandial:   less than 140 mg/dL      Peak postprandial:   less than 180 mg/dL (1-2 hours)      Critically ill patients:  140 - 180 mg/dL   Lab Results  Component Value Date   GLUCAP 85 04/08/2022   HGBA1C 6.4 (H) 12/19/2021    Review of Glycemic Control  Latest Reference Range & Units 04/07/22 17:28 04/07/22 18:20 04/07/22 19:59 04/08/22 01:11 04/08/22 02:16 04/08/22 07:41  Glucose-Capillary 70 - 99 mg/dL 72 64 (L) 133 (H) 65 (L) 147 (H) 85  (L): Data is abnormally low (H): Data is abnormally high  Diabetes history: DM2 Outpatient Diabetes medications: Amaryl 5 mg QD Current orders for Inpatient glycemic control: Novolog 0-15 units TID  Inpatient Diabetes Program Recommendations:    No insulin needs thus far.  Please consider: Novolog 0-9 units TID  Will continue to follow while inpatient.  Thank you, Reche Dixon, MSN, [place] Diabetes Coordinator Inpatient Diabetes Program [phone] (team pager from 8a-5p)

## 2022-04-08 NOTE — Evaluation (Signed)
Physical Therapy Evaluation Patient Details Name: MARJANI KOBEL MRN: [id number] DOB: 07/24/1950 Today's Date: 04/08/2022  History of Present Illness  71 y.o. female seen in the emergency room brought by EMS patient was found on the side of her bed was found to be hypoglycemic with blood sugars in the 40s.  D50 was given by EMS patient remained alert coherent oriented.  Clinical Impression  Pt feeling back to baseline and overall did quite well.  She was able to easily get up to sitting EOB, stand confidently w/o AD and walked in the hallway with good safety and confidence ~200 ft w/o AD.  Pt is independent at baseline but does have neighbors that can assist if needed.       Recommendations for follow up therapy are one component of a multi-disciplinary discharge planning process, led by the attending physician.  Recommendations may be updated based on patient status, additional functional criteria and insurance authorization.  Follow Up Recommendations No PT follow up      Assistance Recommended at Discharge None  Patient can return home with the following       Equipment Recommendations None recommended by PT  Recommendations for Other Services       Functional Status Assessment Patient has not had a recent decline in their functional status     Precautions / Restrictions Precautions Precautions: None Restrictions Weight Bearing Restrictions: No      Mobility  Bed Mobility Overal bed mobility: Independent             General bed mobility comments: pt easily gets up to sitting EOB    Transfers Overall transfer level: Independent Equipment used: Rolling walker (2 wheels)               General transfer comment: Pt showed good overall confidence, safety with transitions to/ from multiple height surfaces    Ambulation/Gait Ambulation/Gait assistance: Modified independent (Device/Increase time) Gait Distance (Feet): 250 Feet Assistive device: None          General Gait Details: Pt with consitent and confident cadence for in hallway ambulation w/o AD.  She did assist with holding lines/leads w/o issue and ultimately reports feeling that she is essentially at her baseline - only minimal fatigue expressed with the effort  Stairs            Wheelchair Mobility    Modified Rankin (Stroke Patients Only)       Balance Overall balance assessment: Modified Independent                                           Pertinent Vitals/Pain Pain Assessment Pain Assessment: No/denies pain    Home Living Family/patient expects to be discharged to:: Private residence (Simultaneous filing. User may not have seen previous data.) Living Arrangements: Alone (Simultaneous filing. User may not have seen previous data.) Available Help at Discharge: Friend(s);Available PRN/intermittently   Home Access: Stairs to enter Entrance Stairs-Rails: None Entrance Stairs-Number of Steps: 3 Alternate Level Stairs-Number of Steps: flight Home Layout: Two level Home Equipment: [place] Walker (2 wheels);Rollator (4 wheels);BSC/3in1      Prior Function Prior Level of Function : Independent/Modified Independent             Mobility Comments: Pt reports that she drives, weedwacks, cooks, etc all w/o issue or AD ADLs Comments: independent with home management, cooking, community activity  Hand Dominance        Extremity/Trunk Assessment   Upper Extremity Assessment Upper Extremity Assessment: Overall WFL for tasks assessed    Lower Extremity Assessment Lower Extremity Assessment: Overall WFL for tasks assessed       Communication   Communication: No difficulties  Cognition Arousal/Alertness: Awake/alert Behavior During Therapy: WFL for tasks assessed/performed Overall Cognitive Status: Within Functional Limits for tasks assessed                                          General Comments       Exercises     Assessment/Plan    PT Assessment Patient does not need any further PT services  PT Problem List Decreased safety awareness;Decreased balance       PT Treatment Interventions      PT Goals (Current goals can be found in the Care Plan section)  Acute Rehab PT Goals Patient Stated Goal: go home PT Goal Formulation: With patient Time For Goal Achievement: 04/21/22 Potential to Achieve Goals: Good    Frequency       Co-evaluation               AM-PAC PT "6 Clicks" Mobility  Outcome Measure Help needed turning from your back to your side while in a flat bed without using bedrails?: None Help needed moving from lying on your back to sitting on the side of a flat bed without using bedrails?: None Help needed moving to and from a bed to a chair (including a wheelchair)?: None Help needed standing up from a chair using your arms (e.g., wheelchair or bedside chair)?: None Help needed to walk in hospital room?: None Help needed climbing 3-5 steps with a railing? : None 6 Click Score: 24    End of Session Equipment Utilized During Treatment: Gait belt Activity Tolerance: Patient tolerated treatment well Patient left: with call bell/phone within reach;in bed Nurse Communication: Mobility status PT Visit Diagnosis: Repeated falls (R29.6)    Time: 2778-2423 PT Time Calculation (min) (ACUTE ONLY): 15 min   Charges:   PT Evaluation $PT Eval Low Complexity: 1 Low          Kreg Shropshire, DPT 04/08/2022, 9:45 AM

## 2022-04-08 NOTE — Progress Notes (Signed)
Progress Note   Patient: [name] NGE:[id number] DOB: 05-Sep-1950 DOA: 04/07/2022     1 DOS: the patient was seen and examined on 04/08/2022   Brief hospital course: HPI on admission: "NOAM FRANZEN is an 71 y.o. female seen in the emergency room brought by EMS patient was found on the side of her bed was found to be hypoglycemic with blood sugars in the 40s.  D50 was given by EMS patient remained alert coherent oriented.  Patient did have low blood sugars yesterday and she passed out and has not been eating well.  Patient is status post gastric bypass and treats her diabetes with Amaryl..."  Pt was admitted for evaluation of syncope, presumed due to hypoglycemia.  Evaluation for other causes, as outlined below, was unrevealing.    Given patient lives alone, has poor renal function slowing clearance of Amaryl, and had struggled with hypoglycemia for several days prior to admission, pt was kept for ongoing close monitoring until blood glucose stabilized without interventions.      Assessment and Plan: * Syncope and collapse Most likely due to hypoglycemic (on sulfonylurea with CKD, mistakenly took extra). Lung perfusion study low probability for PE. Orthostatic vitals within normal. Resting BP's labile, soft at times No neurologic signs or deficits, MRI brain negative. --Monitor telemetry --Hold antihypertensives --Hold sulfonylurea & stop at discharge given CKD, high risk for hypoglycemic episodes   CAD, ARTERY BYPASS GRAFT Stable. No chest pain or other acute issues.  --continue aspirin and Plavix.   Hypothyroidism TSH mildly elevated, and free T4 mildly suppressed. Continue current dose levothyroxine. Recommend PCP repeat thyroid studies in about 6 weeks.   Abnormal EKG EKG on admission was sinus rhythm 66 bpm with a PR of 50 that short, nonspecific interventricular conduction delay, anterolateral infarct with T wave inversions in V5 V6 and lead III, Q waves in lead I  and aVL. Previous ekg in July 2023 showed SR with prolonged PR of 240. --Pt has no chest pain --Repeat EKG if chest pain        Hypoglycemia Treated with IV D5w fluids. Stop dextrose fluids and continue to monitor CBG's closely.  Hypoglycemia protocol.   Type 2 diabetes mellitus without complication (HCC) Sliding scale coverage. Hold home regimen with insulin and Amaryl. Stop Amaryl at discharge. Likely will start Tradjenta instead.  Patient is agreeable.   Chronic kidney disease, stage III (moderate) (HCC) Renal function near baseline. Monitor.  Hold nephrotoxins.  Avoid IV contrast.   Anemia Pt denies any signs of blood in stool.   Had prior colonoscopy.  Pt reported being anemic all her life.   Hbg stable.  --Monitor CBC. --Defer evaluation to outpatient PCP.   HYPERTENSION, BENIGN On admission, held home amlodipine and benazepril and clonidine and hydralazine pending orthostatic vitals and syncope evaluation. Monitor BP and resume antihypertensives gradually.  (BP's overall stable, intermittently soft)    PAD (peripheral artery disease) (HCC)-resolved as of 04/07/2022 On DAPT        Subjective: Pt in the ED, holding for bed today.  She reports overall feeling better with good appetite and drinking well.  She reports several days of difficulty with low blood sugars.  She then yesterday forgot that she had taken her morning meds and took them later in the day including her Amaryl.  She is agreeable to change in therapy given Amaryl carries risk of hypoglycemia in the setting of her chronic kidney disease.  Denies chest pain, shortness of breath, palpitations, dizziness  or lightheadedness at rest or with standing.  Physical Exam: Vitals:   04/08/22 1230 04/08/22 1300 04/08/22 1400 04/08/22 1500  BP:  (!) 141/63 (!) 141/59 128/66  Pulse: 66 67 70 69  Resp: 16 15 18 12   Temp:      TempSrc:      SpO2: 96% 98% 96% 97%  Weight:      Height:       General exam:  awake, alert, no acute distress HEENT: atraumatic, clear conjunctiva, anicteric sclera, moist mucus membranes, hearing grossly normal  Respiratory system: CTAB, no wheezes, rales or rhonchi, normal respiratory effort. Cardiovascular system: normal S1/S2, RRR, no JVD, murmurs, rubs, gallops, no pedal edema.   Gastrointestinal system: soft, NT, ND, no HSM felt, +bowel sounds. Central nervous system: A&O x4. no gross focal neurologic deficits, normal speech Extremities: moves all, no edema, normal tone Skin: dry, intact, normal temperature, normal color, No rashes, lesions or ulcers Psychiatry: normal mood, congruent affect, judgement and insight appear normal   Data Reviewed:  Notable labs: CBGs 65, 147, 85, 146, 103  NM pulmonary perfusion study low probability for PE  Family Communication: None  Disposition: Status is: Observation The patient remains OBS appropriate and will d/c before 2 midnights. Patient remains in hospital overnight for further close monitoring due to concern for recurrent hypoglycemic episodes in the setting of sulfonylurea use and chronic kidney disease with poor clearance.  Patient warrants further close monitoring to prevent further morbidity mortality or readmission.    Planned Discharge Destination: Home    Time spent: 40 minutes  Author: Ezekiel Slocumb, DO 04/08/2022 4:16 PM  For on call review [email or web address].

## 2022-04-08 NOTE — Care Management CC44 (Signed)
Condition Code 44 Documentation Completed  Patient Details  Name: [name] MRN: [id number] Date of Birth: Dec 28, 1950   Condition Code 44 given:  Yes Patient signature on Condition Code 44 notice:  Yes Documentation of 2 MD's agreement:  Yes Code 44 added to claim:  Yes    Shelbie Hutching, RN 04/08/2022, 3:20 PM

## 2022-04-08 NOTE — ED Notes (Signed)
Pt given gram crackers, peanut butter and apple juice

## 2022-04-08 NOTE — Progress Notes (Signed)
SLP Cancellation Note  Patient Details Name: [name] MRN: [id number] DOB: March 04, 1951   Cancelled treatment:       Reason Eval/Treat Not Completed: SLP screened, no needs identified, will sign off (chart reviewed; consulted NSG then met w/ pt as she was eating breakfast) Pt denied any difficulty swallowing and is currently on a regular diet; tolerates swallowing pills w/ water per NSG. Pt conversed in full conversation w/ both NSG and this Clinician w/out expressive/receptive deficits noted; pt denied any speech-language deficits. Speech clear. Pt was able to describe events leading up to her admit to the ED clearly. She is using a phone to make phone calls appropriately. No further skilled ST services indicated as pt appears at her baseline. Pt agreed. NSG to reconsult if any change in status while admitted.       Orinda Kenner, [place], CCC-SLP Speech Language Pathologist Rehab Services; [place] [phone] (ascom) Chrislyn Seedorf 04/08/2022, 8:21 AM

## 2022-04-08 NOTE — Hospital Course (Signed)
HPI on admission: "[name] is an 71 y.o. female seen in the emergency room brought by EMS patient was found on the side of her bed was found to be hypoglycemic with blood sugars in the 40s.  D50 was given by EMS patient remained alert coherent oriented.  Patient did have low blood sugars yesterday and she passed out and has not been eating well.  Patient is status post gastric bypass and treats her diabetes with Amaryl..."  Pt was admitted for evaluation of syncope, presumed due to hypoglycemia.  Evaluation for other causes, as outlined below, was unrevealing.    Given patient lives alone, has poor renal function slowing clearance of Amaryl, and had struggled with hypoglycemia for several days prior to admission, pt was kept for ongoing close monitoring until blood glucose stabilized without interventions.

## 2022-04-08 NOTE — Evaluation (Addendum)
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 Pt is alert and oriented x4, appropriate safety awareness. PTA pt is MOD I-I in ADL/IADL, has friends that will assist her as needed per pt report. Pt is currently performing ADL at/close to baseline (slightly increased time for toilet tranfers, intermittent vcs for technique in a novel environment). No further OT needs identified at this time. OT will sign off at this time. Please reconsult if there is a change in functional status.      Recommendations for follow up therapy are one component of a multi-disciplinary discharge planning process, led by the attending physician.  Recommendations may be updated based on patient status, additional functional criteria and insurance authorization.   Follow Up Recommendations  No OT follow up    Assistance Recommended at Discharge PRN  Patient can return home with the following Assistance with cooking/housework    Functional Status Assessment  Patient has had a recent decline in their functional status and demonstrates the ability to make significant improvements in function in a reasonable and predictable amount of time.  Equipment Recommendations  None recommended by OT (pt has recommended equipment at home)    Recommendations for Other Services       Precautions / Restrictions Precautions Precautions: Fall Restrictions Weight Bearing Restrictions: No      Mobility Bed Mobility Overal bed mobility: Modified Independent                   Transfers Overall transfer level: Modified independent Equipment used: None                      Balance Overall balance assessment: Needs assistance Sitting-balance support: Feet supported Sitting balance-Leahy Scale: Good     Standing balance support: During functional activity Standing balance-Leahy Scale: Good                             ADL either performed or assessed with clinical judgement   ADL Overall ADL's : At baseline                 Upper Body Dressing : Modified independent   Lower Body Dressing: Modified independent   Toilet Transfer: Supervision/safety;Ambulation;Regular Toilet   Toileting- Clothing Manipulation and Hygiene: Modified independent;Sit to/from stand       Functional mobility during ADLs: Supervision/safety (approx 100' in hallway no AD)       Vision Patient Visual Report: No change from baseline       Perception     Praxis      Pertinent Vitals/Pain Pain Assessment Pain Assessment: No/denies pain     Hand Dominance     Extremity/Trunk Assessment Upper Extremity Assessment Upper Extremity Assessment: Overall WFL for tasks assessed   Lower Extremity Assessment Lower Extremity Assessment: Overall WFL for tasks assessed       Communication Communication Communication: No difficulties   Cognition Arousal/Alertness: Awake/alert Behavior During Therapy: WFL for tasks assessed/performed Overall Cognitive Status: Within Functional Limits for tasks assessed  General Comments       Exercises Other Exercises Other Exercises: edu re: role of OT, role of rehab, discharge recommendations, energy conservation techniques, DME use for safe ADL completion/falls prevention   Shoulder Instructions      Home Living Family/patient expects to be discharged to:: Private residence Living Arrangements: Alone Available Help at Discharge:  Friend(s);Available PRN/intermittently   Home Access: Stairs to enter Entrance Stairs-Number of Steps: 3 Entrance Stairs-Rails: None Home Layout: Two level;Able to live on main level with bedroom/bathroom Alternate Level Stairs-Number of Steps: flight Alternate Level Stairs-Rails:  (yes) Bathroom Shower/Tub: Occupational psychologist: Standard     Home Equipment: Maynard (2 wheels);Rollator (4 wheels);BSC/3in1          Prior Functioning/Environment Prior Level of Function : Independent/Modified Independent             Mobility Comments: amb no AD, no recent fall history ADLs Comments: MOD I-I in ADL/IADL, cooks, cleans, drives        OT Problem List:        OT Treatment/Interventions:      OT Goals(Current goals can be found in the care plan section)    OT Frequency:      Co-evaluation              AM-PAC OT "6 Clicks" Daily Activity     Outcome Measure Help from another person eating meals?: None Help from another person taking care of personal grooming?: None Help from another person toileting, which includes using toliet, bedpan, or urinal?: None Help from another person bathing (including washing, rinsing, drying)?: None Help from another person to put on and taking off regular upper body clothing?: None Help from another person to put on and taking off regular lower body clothing?: None 6 Click Score: 24   End of Session Nurse Communication: Mobility status  Activity Tolerance: Patient tolerated treatment well Patient left: in bed;with call bell/phone within reach  OT Visit Diagnosis: Unsteadiness on feet (R26.81)                Time: 6122-4497 OT Time Calculation (min): 10 min Charges:  OT General Charges $OT Visit: 1 Visit OT Evaluation $OT Eval Low Complexity: 1 Low  Shanon Payor, OTD OTR/L  04/08/22, 10:23 AM

## 2022-04-08 NOTE — TOC Initial Note (Signed)
Transition of Care [facility]) - Initial/Assessment Note    Patient Details  Name: [name] MRN: [id number] Date of Birth: 13-Jul-1950  [facility]) CM/SW Contact:    Shelbie Hutching, RN Phone Number: 04/08/2022, 3:24 PM  Clinical Narrative:                 Patient from home alone, independent, placed under observation for hypoglycemia.  No discharge needs identified at this time, no PT or OT follow up recommended.  Patient has a friend that will be able to come and pick her up at discharge.    Expected Discharge Plan: Home/Self Care Barriers to Discharge: Continued Medical Work up   Patient Goals and CMS Choice Patient states their goals for this hospitalization and ongoing recovery are:: to go home      Expected Discharge Plan and Services Expected Discharge Plan: Home/Self Care   Discharge Planning Services: CM Consult   Living arrangements for the past 2 months: Single Family Home                 DME Arranged: N/A DME Agency: NA       HH Arranged: NA          Prior Living Arrangements/Services Living arrangements for the past 2 months: Single Family Home Lives with:: Self Patient language and need for interpreter reviewed:: Yes Do you feel safe going back to the place where you live?: Yes      Need for Family Participation in Patient Care: Yes (Comment) Care giver support system in place?: Yes (comment)   Criminal Activity/Legal Involvement Pertinent to Current Situation/Hospitalization: No - Comment as needed  Activities of Daily Living Home Assistive Devices/Equipment: Eyeglasses ADL Screening (condition at time of admission) Patient's cognitive ability adequate to safely complete daily activities?: Yes Is the patient deaf or have difficulty hearing?: No Does the patient have difficulty seeing, even when wearing glasses/contacts?: No Does the patient have difficulty concentrating, remembering, or making decisions?: No Patient able to express  need for assistance with ADLs?: Yes Does the patient have difficulty dressing or bathing?: No Independently performs ADLs?: Yes (appropriate for developmental age) Does the patient have difficulty walking or climbing stairs?: No Weakness of Legs: None Weakness of Arms/Hands: None  Permission Sought/Granted                  Emotional Assessment Appearance:: Appears stated age Attitude/Demeanor/Rapport: Engaged Affect (typically observed): Accepting Orientation: : Oriented to Self, Oriented to Place, Oriented to  Time, Oriented to Situation Alcohol / Substance Use: Not Applicable Psych Involvement: No (comment)  Admission diagnosis:  Syncope and collapse [R55] Patient Active Problem List   Diagnosis Date Noted   Hypoglycemia 04/07/2022   Abnormal EKG 04/07/2022   Ischemic pain of left foot    Ischemic foot 07/15/2019   Healthcare maintenance 11/30/2018   Amaurosis fugax of right eye 12/14/2017   Bilateral leg edema 09/28/2017   Hyperlipidemia, mixed 09/28/2017   Anemia 09/01/2017   Chronic insomnia 01/26/2017   Postmenopausal 08/05/2016   Chronic kidney disease, stage III (moderate) ([place]) 10/16/2014   Hypothyroidism 08/06/2014   Type 2 diabetes mellitus without complication ([place]) 00/93/8182   Feces contents abnormal 11/07/2013   HYPERTENSION, BENIGN 11/10/2008   CAD, ARTERY BYPASS GRAFT 11/10/2008   Syncope and collapse 11/10/2008   PCP:  Kirk Ruths, MD Pharmacy:   [facility] [place], [place] [address]  Phone: [phone] Fax: [phone]     Social Determinants of Health (SDOH) Interventions    Readmission Risk Interventions     No data to display

## 2022-04-08 NOTE — Care Management Obs Status (Signed)
[place] NOTIFICATION   Patient Details  Name: [name] MRN: [id number] Date of Birth: 1950/08/13   Medicare Observation Status Notification Given:  Yes    Shelbie Hutching, RN 04/08/2022, 3:20 PM

## 2022-04-08 NOTE — Assessment & Plan Note (Signed)
On DAPT

## 2022-04-09 DIAGNOSIS — R55 Syncope and collapse: Secondary | ICD-10-CM | POA: Diagnosis not present

## 2022-04-09 LAB — BASIC METABOLIC PANEL
Anion gap: 4 — ABNORMAL LOW (ref 5–15)
BUN: 37 mg/dL — ABNORMAL HIGH (ref 8–23)
CO2: 19 mmol/L — ABNORMAL LOW (ref 22–32)
Calcium: 8.1 mg/dL — ABNORMAL LOW (ref 8.9–10.3)
Chloride: 120 mmol/L — ABNORMAL HIGH (ref 98–111)
Creatinine, Ser: 1.93 mg/dL — ABNORMAL HIGH (ref 0.44–1.00)
GFR, Estimated: 27 mL/min — ABNORMAL LOW (ref >60)
Glucose, Bld: 102 mg/dL — ABNORMAL HIGH (ref 70–99)
Potassium: 4.5 mmol/L (ref 3.5–5.1)
Sodium: 143 mmol/L (ref 135–145)

## 2022-04-09 LAB — GLUCOSE, CAPILLARY
Glucose-Capillary: 106 mg/dL — ABNORMAL HIGH (ref 70–99)
Glucose-Capillary: 109 mg/dL — ABNORMAL HIGH (ref 70–99)
Glucose-Capillary: 98 mg/dL (ref 70–99)

## 2022-04-09 LAB — LIPID PANEL
Cholesterol: 136 mg/dL (ref 0–200)
HDL: 46 mg/dL (ref >40)
LDL Cholesterol: 69 mg/dL (ref 0–99)
Total CHOL/HDL Ratio: 3 RATIO
Triglycerides: 105 mg/dL (ref <150)
VLDL: 21 mg/dL (ref 0–40)

## 2022-04-09 MED ORDER — LINAGLIPTIN 5 MG PO TABS: 5.0000 mg | ORAL_TABLET | Freq: Every day | ORAL | 2 refills | Status: DC

## 2022-04-09 MED ORDER — LINAGLIPTIN 5 MG PO TABS
5.0000 mg | ORAL_TABLET | Freq: Every day | ORAL | Status: DC
  Administered 2022-04-09: 5 mg via ORAL
  Filled 2022-04-09: qty 1

## 2022-04-09 MED ORDER — BLOOD GLUCOSE MONITOR KIT: PACK | 0 refills | Status: DC

## 2022-04-09 NOTE — Discharge Summary (Signed)
Physician Discharge Summary   Patient: [name] MRN: [id number] DOB: September 16, 1950  Admit date:     04/07/2022  Discharge date: 04/10/22  Discharge Physician: Ezekiel Slocumb   PCP: Kirk Ruths, MD   Recommendations at discharge:   Follow up with PCP in 1-2 weeks Follow up on glycemic control and change in therapy  Follow up on blood pressures -- several medications held at d/c to prevent hypotension, as BP's were controlled with meds held during admission.  Resume held medications as indicated & as BP will tolerate Repeat BMP, Mg, CBC in 1-2 weeks  Discharge Diagnoses: Principal Problem:   Syncope and collapse Active Problems:   CAD, ARTERY BYPASS GRAFT   Hypothyroidism   HYPERTENSION, BENIGN   Anemia   Chronic kidney disease, stage III (moderate) (HCC)   Type 2 diabetes mellitus without complication (HCC)   Hypoglycemia   Abnormal EKG   Hospital Course: HPI on admission: "[name] is an 71 y.o. female seen in the emergency room brought by EMS patient was found on the side of her bed was found to be hypoglycemic with blood sugars in the 40s.  D50 was given by EMS patient remained alert coherent oriented.  Patient did have low blood sugars yesterday and she passed out and has not been eating well.  Patient is status post gastric bypass and treats her diabetes with Amaryl..."  Pt was admitted for evaluation of syncope, presumed due to hypoglycemia.  Evaluation for other causes, as outlined below, was unrevealing.    Given patient lives alone, has poor renal function slowing clearance of Amaryl, and had struggled with hypoglycemia for several days prior to admission, pt was kept for ongoing close monitoring until blood glucose stabilized without interventions.      11/2 -- pt's glucose has been stable.  She is asymptomatic and clinically improved, stable for discharge today and close PCP follow up.   Assessment and Plan: * Syncope and collapse Most likely  due to hypoglycemic (on sulfonylurea with CKD, mistakenly took extra).   Possibly also due to multiple antihypertensives. Lung perfusion study low probability for PE. Orthostatic vitals within normal. Resting BP's labile, soft at times No neurologic signs or deficits, MRI brain negative. --Monitored telemetry with no significant events --Held antihypertensives and overall BP was controlled, mildly elevated at times. --Resume only Lasix as discharge --Close PCP follow up to cautiously resume other antihypertensives, if indicated --Recommend home BP monitoring until follow up --Stop sulfonylurea & stop at discharge given CKD, high risk for hypoglycemic episodes (started Tradjenta)   CAD, ARTERY BYPASS GRAFT Stable. No chest pain or other acute issues.  --continue aspirin and Plavix.   Hypothyroidism TSH mildly elevated, and free T4 mildly suppressed. Continue current dose levothyroxine. Recommend PCP repeat thyroid studies in about 6 weeks.   Abnormal EKG EKG on admission was sinus rhythm 66 bpm with a PR of 50 that short, nonspecific interventricular conduction delay, anterolateral infarct with T wave inversions in V5 V6 and lead III, Q waves in lead I and aVL. Previous ekg in July 2023 showed SR with prolonged PR of 240. --Pt has no chest pain --Repeat EKG if chest pain        Hypoglycemia Treated with IV D5w fluids. Stop dextrose fluids and continue to monitor CBG's closely.  Hypoglycemia protocol.  Stop sulfonylurea to prevent recurrence, given CKD and reduced clearance.  Type 2 diabetes mellitus without complication (HCC) Sliding scale coverage. Hold home regimen with insulin and  Amaryl. Stop Amaryl at discharge. Started Tradjenta.   Close PCP follow up.  Chronic kidney disease, stage III (moderate) (HCC) Renal function near baseline. Monitor.  Hold nephrotoxins.  Avoid IV contrast.   Anemia Pt denies any signs of blood in stool.   Had prior colonoscopy.  Pt  reported being anemic all her life.   Hbg stable.  --Monitor CBC. --Defer evaluation to outpatient PCP.   HYPERTENSION, BENIGN On admission, held home amlodipine and benazepril and clonidine and hydralazine pending orthostatic vitals and syncope evaluation. Monitor BP and resume antihypertensives gradually.  (BP's overall stable, intermittently soft)    PAD (peripheral artery disease) (HCC)-resolved as of 04/07/2022 On DAPT         Consultants: None Procedures performed: None  Disposition: Home Diet recommendation:  Cardiac and Carb modified diet DISCHARGE MEDICATION: Allergies as of 04/09/2022       Reactions   Niacin Other (See Comments)   Heart Attack Other reaction(s): Other (see comments) Heart Attack   Tramadol    Other reaction(s): Other (see comments) Intolerance, makes woozy   Ultram [tramadol Hcl]    Intolerance, makes woozy   Baclofen Other (See Comments)   Dizziness Other reaction(s): Other (see comments) Dizziness   Cardura [doxazosin Mesylate] Other (See Comments)   Dizzy with passing out spells   Doxazosin    Other reaction(s): Other (see comments) Dizzy with passing out spells Dizzy with passing out spells   Morphine And Related Nausea And Vomiting, Nausea Only   Other Rash   Gold metal (nickel in the gold) Gold metal Gold metal (nickel in the gold)        Medication List     STOP taking these medications    amLODipine 5 MG tablet Commonly known as: NORVASC   benazepril 40 MG tablet Commonly known as: LOTENSIN   cloNIDine 0.1 MG tablet Commonly known as: CATAPRES   glimepiride 1 MG tablet Commonly known as: AMARYL   hydrALAZINE 50 MG tablet Commonly known as: APRESOLINE   sodium bicarbonate 650 MG tablet       TAKE these medications    aspirin EC 81 MG tablet Take 81 mg by mouth daily.   blood glucose meter kit and supplies Kit Dispense based on patient and insurance preference. Use up to four times daily as  directed.   clopidogrel 75 MG tablet Commonly known as: PLAVIX Take 1 tablet (75 mg total) by mouth daily.   diphenhydrAMINE 50 MG capsule Commonly known as: BENADRYL Take 50 mg by mouth at bedtime as needed for sleep (Takes Benadryl 50 mg with 1 tab Tylenol PM qHS as needed).   furosemide 40 MG tablet Commonly known as: LASIX Take 40 mg by mouth daily.   levothyroxine 200 MCG tablet Commonly known as: SYNTHROID Take 200 mcg by mouth as directed. Take 1 tablet (200 mcg total) by mouth every Monday, Wednesday, and Friday Take on an empty stomach with a glass of water at least 30-60 minutes before breakfast. What changed: Another medication with the same name was removed. Continue taking this medication, and follow the directions you see here.   linagliptin 5 MG Tabs tablet Commonly known as: TRADJENTA Take 1 tablet (5 mg total) by mouth daily.   lovastatin 40 MG tablet Commonly known as: MEVACOR Take 40 mg by mouth daily.   multivitamin with minerals Tabs tablet Take 1 tablet by mouth daily.   pantoprazole 20 MG tablet Commonly known as: PROTONIX Take 20 mg by mouth daily.  potassium chloride 10 MEQ tablet Commonly known as: KLOR-CON Take 20 mEq by mouth daily.        Discharge Exam: Filed Weights   04/07/22 1451  Weight: 85.7 kg   General exam: awake, alert, no acute distress HEENT: atraumatic, clear conjunctiva, anicteric sclera, moist mucus membranes, hearing grossly normal  Respiratory system: CTAB, no wheezes, rales or rhonchi, normal respiratory effort. Cardiovascular system: normal S1/S2, RRR, no JVD, murmurs, rubs, gallops, no pedal edema.   Gastrointestinal system: soft, NT, ND, no HSM felt, +bowel sounds. Central nervous system: A&O x4. no gross focal neurologic deficits, normal speech Extremities: moves all, no edema, normal tone Skin: dry, intact, normal temperature, normal color, no rashes, lesions or ulcers Psychiatry: normal mood, congruent  affect, judgement and insight appear normal   Condition at discharge: stable  The results of significant diagnostics from this hospitalization (including imaging, microbiology, ancillary and laboratory) are listed below for reference.   Imaging Studies: NM Pulmonary Perfusion  Result Date: 04/08/2022 CLINICAL DATA:  Pulmonary embolism (PE) suspected, high prob SYNCOPE EXAM: NUCLEAR MEDICINE PERFUSION LUNG SCAN TECHNIQUE: Perfusion images were obtained in multiple projections after intravenous injection of radiopharmaceutical. Ventilation scans intentionally deferred if perfusion scan and chest x-ray adequate for interpretation during COVID 19 epidemic. RADIOPHARMACEUTICALS:  4.21 mCi Tc-25mMAA IV COMPARISON:  Chest x-ray 04/07/2022 FINDINGS: Subsegmental perfusion defect within the left mid lung. No segmental or larger perfusion defects. Enlarged cardiac silhouette and elevation of the right hemidiaphragm is evident which corresponds to radiographic findings. IMPRESSION: Low probability for pulmonary embolism. Electronically Signed   By: NDavina PokeD.O.   On: 04/08/2022 09:57   MR BRAIN WO CONTRAST  Result Date: 04/07/2022 CLINICAL DATA:  Initial evaluation for mental status change. EXAM: MRI HEAD WITHOUT CONTRAST TECHNIQUE: Multiplanar, multiecho pulse sequences of the brain and surrounding structures were obtained without intravenous contrast. COMPARISON:  Prior CT from earlier the same day. FINDINGS: Brain: Mild age-related cerebral atrophy with chronic small vessel ischemic disease. Few scattered small remote cerebellar infarcts noted. No evidence for acute or subacute ischemia. No acute intracranial hemorrhage. Single punctate chronic microhemorrhage noted within the left cerebellum. No other chronic intracranial blood products. No mass lesion, mass effect or midline shift. No hydrocephalus or extra-axial fluid collection. Pituitary gland and suprasellar region within normal limits.  Vascular: Major intracranial vascular flow voids are maintained. Skull and upper cervical spine: Craniocervical junction within normal limits. Bone marrow signal intensity normal. No scalp soft tissue abnormality. Sinuses/Orbits: Globes orbital soft tissues within normal limits. Paranasal sinuses are clear. No significant mastoid effusion. Other: None. IMPRESSION: 1. No acute intracranial abnormality. 2. Mild age-related cerebral atrophy with chronic small vessel ischemic disease, with a few scattered small remote cerebellar infarcts. Electronically Signed   By: BJeannine BogaM.D.   On: 04/07/2022 22:35   CT Head Wo Contrast  Result Date: 04/07/2022 CLINICAL DATA:  Head trauma, minor (Age >= 65y); Neck trauma (Age >= 65y). Fall yesterday. EXAM: CT HEAD WITHOUT CONTRAST CT CERVICAL SPINE WITHOUT CONTRAST TECHNIQUE: Multidetector CT imaging of the head and cervical spine was performed following the standard protocol without intravenous contrast. Multiplanar CT image reconstructions of the cervical spine were also generated. RADIATION DOSE REDUCTION: This exam was performed according to the departmental dose-optimization program which includes automated exposure control, adjustment of the mA and/or kV according to patient size and/or use of iterative reconstruction technique. COMPARISON:  Head CT 12/17/2021.  Head MRI 12/18/2021. FINDINGS: CT HEAD FINDINGS Brain: There is no evidence  of an acute infarct, intracranial hemorrhage, mass, midline shift, or extra-axial fluid collection. A small chronic infarct is again noted inferiorly in the right cerebellar hemisphere. Mild cerebral atrophy is within normal limits for age. Cerebral white matter hypodensities are nonspecific but compatible with mild chronic small vessel ischemic disease. Vascular: Calcified atherosclerosis at the skull base. No hyperdense vessel. Skull: No acute fracture or suspicious osseous lesion. Sinuses/Orbits: Paranasal sinuses and  mastoid air cells are clear. Unremarkable orbits. Other: None. CT CERVICAL SPINE FINDINGS Alignment: Trace retrolisthesis of C3 on C4 and C4 on C5 and trace anterolisthesis of C7 on T1. Skull base and vertebrae: No acute fracture or suspicious osseous lesion. Soft tissues and spinal canal: No prevertebral fluid or swelling. No visible canal hematoma. Disc levels: Moderately advanced disc space narrowing and degenerative endplate changes at T7-3 with milder disc degeneration at the other cervical and upper thoracic levels. Advanced facet arthrosis at C7-T1. Bilateral facet ankylosis from T1-T3. Multilevel neural foraminal stenosis due to uncovertebral spurring, moderate on the right at C4-5. Mild-to-moderate spinal stenosis at C3-4 and C4-5 due to central disc protrusions and infolding of the ligamentum flavum. Upper chest: No apical lung consolidation or mass. Other: Prominent calcific atherosclerosis at the carotid bifurcations. IMPRESSION: No evidence of acute intracranial abnormality or cervical spine fracture. Electronically Signed   By: Logan Bores M.D.   On: 04/07/2022 15:45   CT Cervical Spine Wo Contrast  Result Date: 04/07/2022 CLINICAL DATA:  Head trauma, minor (Age >= 65y); Neck trauma (Age >= 65y). Fall yesterday. EXAM: CT HEAD WITHOUT CONTRAST CT CERVICAL SPINE WITHOUT CONTRAST TECHNIQUE: Multidetector CT imaging of the head and cervical spine was performed following the standard protocol without intravenous contrast. Multiplanar CT image reconstructions of the cervical spine were also generated. RADIATION DOSE REDUCTION: This exam was performed according to the departmental dose-optimization program which includes automated exposure control, adjustment of the mA and/or kV according to patient size and/or use of iterative reconstruction technique. COMPARISON:  Head CT 12/17/2021.  Head MRI 12/18/2021. FINDINGS: CT HEAD FINDINGS Brain: There is no evidence of an acute infarct, intracranial  hemorrhage, mass, midline shift, or extra-axial fluid collection. A small chronic infarct is again noted inferiorly in the right cerebellar hemisphere. Mild cerebral atrophy is within normal limits for age. Cerebral white matter hypodensities are nonspecific but compatible with mild chronic small vessel ischemic disease. Vascular: Calcified atherosclerosis at the skull base. No hyperdense vessel. Skull: No acute fracture or suspicious osseous lesion. Sinuses/Orbits: Paranasal sinuses and mastoid air cells are clear. Unremarkable orbits. Other: None. CT CERVICAL SPINE FINDINGS Alignment: Trace retrolisthesis of C3 on C4 and C4 on C5 and trace anterolisthesis of C7 on T1. Skull base and vertebrae: No acute fracture or suspicious osseous lesion. Soft tissues and spinal canal: No prevertebral fluid or swelling. No visible canal hematoma. Disc levels: Moderately advanced disc space narrowing and degenerative endplate changes at U2-0 with milder disc degeneration at the other cervical and upper thoracic levels. Advanced facet arthrosis at C7-T1. Bilateral facet ankylosis from T1-T3. Multilevel neural foraminal stenosis due to uncovertebral spurring, moderate on the right at C4-5. Mild-to-moderate spinal stenosis at C3-4 and C4-5 due to central disc protrusions and infolding of the ligamentum flavum. Upper chest: No apical lung consolidation or mass. Other: Prominent calcific atherosclerosis at the carotid bifurcations. IMPRESSION: No evidence of acute intracranial abnormality or cervical spine fracture. Electronically Signed   By: Logan Bores M.D.   On: 04/07/2022 15:45   DG Chest 2 View  Result Date: 04/07/2022 CLINICAL DATA:  Weakness and falls. EXAM: CHEST - 2 VIEW COMPARISON:  06/30/2019 FINDINGS: Surgical changes again noted from cardiac surgery. The heart is mildly enlarged but stable. Stable tortuosity and calcification of the thoracic aorta. Stable moderate eventration of the right hemidiaphragm but no acute  pulmonary process. No pleural effusions or pulmonary lesions. The bony thorax is intact. IMPRESSION: No acute cardiopulmonary findings. Electronically Signed   By: Marijo Sanes M.D.   On: 04/07/2022 15:38    Microbiology: Results for orders placed or performed during the hospital encounter of 03/27/21  Urine Culture     Status: Abnormal   Collection Time: 03/27/21  5:44 PM   Specimen: Urine, Clean Catch  Result Value Ref Range Status   Specimen Description URINE, CLEAN CATCH  Final   Special Requests   Final    NONE Performed at [facility], [place] [address] [place], [place] [address]    Culture >=100,000 COLONIES/mL ESCHERICHIA COLI (A)  Final   Report Status 03/29/2021 FINAL  Final   Organism ID, Bacteria ESCHERICHIA COLI (A)  Final      Susceptibility   Escherichia coli - MIC*    AMPICILLIN 4 SENSITIVE Sensitive     CEFAZOLIN <=4 SENSITIVE Sensitive     CEFEPIME <=0.12 SENSITIVE Sensitive     CEFTRIAXONE <=0.25 SENSITIVE Sensitive     CIPROFLOXACIN 0.5 INTERMEDIATE Intermediate     GENTAMICIN <=1 SENSITIVE Sensitive     IMIPENEM <=0.25 SENSITIVE Sensitive     NITROFURANTOIN <=16 SENSITIVE Sensitive     TRIMETH/SULFA <=20 SENSITIVE Sensitive     AMPICILLIN/SULBACTAM <=2 SENSITIVE Sensitive     PIP/TAZO <=4 SENSITIVE Sensitive     * >=100,000 COLONIES/mL ESCHERICHIA COLI    Labs: CBC: Recent Labs  Lab 04/07/22 1510  WBC 7.4  NEUTROABS 5.8  HGB 10.2*  HCT 35.4*  MCV 79.9*  PLT 223   Basic Metabolic Panel: Recent Labs  Lab 04/07/22 1510 04/09/22 0522  NA 139 143  K 4.5 4.5  CL 115* 120*  CO2 17* 19*  GLUCOSE 65* 102*  BUN 40* 37*  CREATININE 2.38* 1.93*  CALCIUM 8.4* 8.1*   Liver Function Tests: Recent Labs  Lab 04/07/22 1510  AST 26  ALT 16  ALKPHOS 93  BILITOT 0.6  PROT 6.7  ALBUMIN 3.5   CBG: Recent Labs  Lab 04/08/22 1609 04/08/22 2008 04/09/22 0051 04/09/22 0459 04/09/22 0745  GLUCAP 103* 102* 98 109* 106*    Discharge time  spent: greater than 30 minutes.  Signed: Ezekiel Slocumb, DO [facility] 04/10/2022

## 2022-04-10 LAB — BPAM RBC
Blood Product Expiration Date: 202311132359
Blood Product Expiration Date: 202312012359
Unit Type and Rh: 7300
Unit Type and Rh: 7300

## 2022-04-10 LAB — TYPE AND SCREEN
ABO/RH(D): B POS
Antibody Screen: POSITIVE
Unit division: 0
Unit division: 0

## 2022-05-19 ENCOUNTER — Observation Stay
Admission: EM | Admit: 2022-05-19 | Discharge: 2022-05-21 | Disposition: A | Discharge status: Home / Self Care | Payer: Medicare Other | Attending: Internal Medicine | Admitting: Internal Medicine

## 2022-05-19 ENCOUNTER — Other Ambulatory Visit: Payer: Self-pay

## 2022-05-19 ENCOUNTER — Emergency Department: Payer: Medicare Other

## 2022-05-19 DIAGNOSIS — I25810 Atherosclerosis of coronary artery bypass graft(s) without angina pectoris: Secondary | ICD-10-CM | POA: Diagnosis present

## 2022-05-19 DIAGNOSIS — Z96652 Presence of left artificial knee joint: Secondary | ICD-10-CM | POA: Diagnosis not present

## 2022-05-19 DIAGNOSIS — I2510 Atherosclerotic heart disease of native coronary artery without angina pectoris: Secondary | ICD-10-CM | POA: Insufficient documentation

## 2022-05-19 DIAGNOSIS — E1122 Type 2 diabetes mellitus with diabetic chronic kidney disease: Secondary | ICD-10-CM | POA: Diagnosis not present

## 2022-05-19 DIAGNOSIS — I5023 Acute on chronic systolic (congestive) heart failure: Secondary | ICD-10-CM | POA: Insufficient documentation

## 2022-05-19 DIAGNOSIS — Z951 Presence of aortocoronary bypass graft: Secondary | ICD-10-CM | POA: Insufficient documentation

## 2022-05-19 DIAGNOSIS — Z7902 Long term (current) use of antithrombotics/antiplatelets: Secondary | ICD-10-CM | POA: Diagnosis not present

## 2022-05-19 DIAGNOSIS — N184 Chronic kidney disease, stage 4 (severe): Secondary | ICD-10-CM | POA: Diagnosis present

## 2022-05-19 DIAGNOSIS — N183 Chronic kidney disease, stage 3 unspecified: Secondary | ICD-10-CM | POA: Insufficient documentation

## 2022-05-19 DIAGNOSIS — E119 Type 2 diabetes mellitus without complications: Secondary | ICD-10-CM

## 2022-05-19 DIAGNOSIS — E039 Hypothyroidism, unspecified: Secondary | ICD-10-CM | POA: Diagnosis not present

## 2022-05-19 DIAGNOSIS — I63532 Cerebral infarction due to unspecified occlusion or stenosis of left posterior cerebral artery: Principal | ICD-10-CM | POA: Insufficient documentation

## 2022-05-19 DIAGNOSIS — I130 Hypertensive heart and chronic kidney disease with heart failure and stage 1 through stage 4 chronic kidney disease, or unspecified chronic kidney disease: Secondary | ICD-10-CM | POA: Diagnosis not present

## 2022-05-19 DIAGNOSIS — Z7985 Long-term (current) use of injectable non-insulin antidiabetic drugs: Secondary | ICD-10-CM | POA: Diagnosis not present

## 2022-05-19 DIAGNOSIS — Z7982 Long term (current) use of aspirin: Secondary | ICD-10-CM | POA: Insufficient documentation

## 2022-05-19 DIAGNOSIS — I6522 Occlusion and stenosis of left carotid artery: Secondary | ICD-10-CM | POA: Diagnosis not present

## 2022-05-19 DIAGNOSIS — I639 Cerebral infarction, unspecified: Secondary | ICD-10-CM | POA: Diagnosis present

## 2022-05-19 DIAGNOSIS — Z79899 Other long term (current) drug therapy: Secondary | ICD-10-CM | POA: Diagnosis not present

## 2022-05-19 DIAGNOSIS — I10 Essential (primary) hypertension: Secondary | ICD-10-CM | POA: Diagnosis present

## 2022-05-19 DIAGNOSIS — D649 Anemia, unspecified: Secondary | ICD-10-CM | POA: Diagnosis present

## 2022-05-19 DIAGNOSIS — R29898 Other symptoms and signs involving the musculoskeletal system: Secondary | ICD-10-CM | POA: Diagnosis not present

## 2022-05-19 DIAGNOSIS — R531 Weakness: Secondary | ICD-10-CM | POA: Diagnosis present

## 2022-05-19 LAB — CBC
HCT: 33 % — ABNORMAL LOW (ref 36.0–46.0)
Hemoglobin: 9.2 g/dL — ABNORMAL LOW (ref 12.0–15.0)
MCH: 23.3 pg — ABNORMAL LOW (ref 26.0–34.0)
MCHC: 27.9 g/dL — ABNORMAL LOW (ref 30.0–36.0)
MCV: 83.5 fL (ref 80.0–100.0)
Platelets: 327 10*3/uL (ref 150–400)
RBC: 3.95 MIL/uL (ref 3.87–5.11)
RDW: 18.9 % — ABNORMAL HIGH (ref 11.5–15.5)
WBC: 5.4 10*3/uL (ref 4.0–10.5)
nRBC: 0 % (ref 0.0–0.2)

## 2022-05-19 LAB — DIFFERENTIAL
Abs Immature Granulocytes: 0.01 10*3/uL (ref 0.00–0.07)
Basophils Absolute: 0 10*3/uL (ref 0.0–0.1)
Basophils Relative: 1 %
Eosinophils Absolute: 0.1 10*3/uL (ref 0.0–0.5)
Eosinophils Relative: 2 %
Immature Granulocytes: 0 %
Lymphocytes Relative: 26 %
Lymphs Abs: 1.4 10*3/uL (ref 0.7–4.0)
Monocytes Absolute: 0.5 10*3/uL (ref 0.1–1.0)
Monocytes Relative: 9 %
Neutro Abs: 3.4 10*3/uL (ref 1.7–7.7)
Neutrophils Relative %: 62 %

## 2022-05-19 LAB — PROTIME-INR
INR: 1.1 (ref 0.8–1.2)
Prothrombin Time: 14.3 seconds (ref 11.4–15.2)

## 2022-05-19 LAB — COMPREHENSIVE METABOLIC PANEL
ALT: 9 U/L (ref 0–44)
AST: 15 U/L (ref 15–41)
Albumin: 3.4 g/dL — ABNORMAL LOW (ref 3.5–5.0)
Alkaline Phosphatase: 101 U/L (ref 38–126)
Anion gap: 5 (ref 5–15)
BUN: 31 mg/dL — ABNORMAL HIGH (ref 8–23)
CO2: 25 mmol/L (ref 22–32)
Calcium: 8.2 mg/dL — ABNORMAL LOW (ref 8.9–10.3)
Chloride: 108 mmol/L (ref 98–111)
Creatinine, Ser: 1.72 mg/dL — ABNORMAL HIGH (ref 0.44–1.00)
GFR, Estimated: 31 mL/min — ABNORMAL LOW (ref >60)
Glucose, Bld: 186 mg/dL — ABNORMAL HIGH (ref 70–99)
Potassium: 4.2 mmol/L (ref 3.5–5.1)
Sodium: 138 mmol/L (ref 135–145)
Total Bilirubin: 0.4 mg/dL (ref 0.3–1.2)
Total Protein: 6.9 g/dL (ref 6.5–8.1)

## 2022-05-19 LAB — ETHANOL: Alcohol, Ethyl (B): <10 mg/dL (ref <10)

## 2022-05-19 LAB — APTT: aPTT: 29 seconds (ref 24–36)

## 2022-05-19 MED ORDER — SODIUM CHLORIDE 0.9% FLUSH: 3.0000 mL | Freq: Once | INTRAVENOUS | Status: DC

## 2022-05-19 NOTE — ED Notes (Signed)
Neuro consulted with patient.

## 2022-05-19 NOTE — ED Notes (Signed)
Patient transported to MRI 

## 2022-05-19 NOTE — ED Triage Notes (Addendum)
Reports around 915 patient had right hand weakness and difficulty lifting a couple.  Patient reports she had a bleed approx 10 days and since her speech has been off and "my hand is not my hand".  Patient denies headache or any other neuro symptoms.  SHe is alert and orientedx4.  Equal strength in all extremities. No bleed noted on her CT scans but hx of scattered cerebellar infarcts

## 2022-05-19 NOTE — ED Notes (Signed)
Admitting MD at bedside.  Pt changed into hospital gown, resting comfortably, lights turned down and call bell in place.

## 2022-05-19 NOTE — ED Provider Notes (Signed)
[facility] Provider Note    Event Date/Time   First MD Initiated Contact with Patient 05/19/22 1732     (approximate)   History   Neurologic Problem   HPI  [name] is a 71 y.o. female patient reports she had some occipital pain a couple days ago took some aspirin for it and seem to have gone away.  Yesterday she had what she describes as "lucidity" of her right hand since then that seems to have improved but she still cannot hold things because she drops them and she cannot write with her right hand anymore.  This is new for her.  She does have a history of neurosis fugax diabetes hypertension coronary artery disease with bypass grafting and stroke in the past.      Physical Exam   Triage Vital Signs: ED Triage Vitals  Enc Vitals Group     BP 05/19/22 1545 138/79     Pulse Rate 05/19/22 1545 (!) 58     Resp 05/19/22 1545 18     Temp 05/19/22 1545 98.4 F (36.9 C)     Temp Source 05/19/22 1545 Oral     SpO2 05/19/22 1545 97 %     Weight 05/19/22 1541 188 lb (85.3 kg)     Height 05/19/22 1541 5\' 6"  (1.676 m)     Head Circumference --      Peak Flow --      Pain Score 05/19/22 1541 0     Pain Loc --      Pain Edu? --      Excl. in [place]? --     Most recent vital signs: Vitals:   05/19/22 2200 05/19/22 2230  BP: (!) 149/64 (!) 191/79  Pulse: (!) 58 75  Resp: 13 13  Temp:    SpO2: 100% 100%     General: Awake, no distress.  CV:  Good peripheral perfusion.  Regular rate and rhythm no audible murmurs Resp:  Normal effort.  Lungs are clear Abd:  No distention.  Neuro exam cranial nerves II through XII appear to be intact although visual fields were not checked.  Cerebellar finger-nose rapid alternating movements in the hands seem to be okay.  I do not really see a marked difference from 1 side to the other.  Motor strength may be slightly weaker on the right side but very slightly if it is actually weaker.  Patient does not report any  numbness   ED Results / Procedures / Treatments   Labs (all labs ordered are listed, but only abnormal results are displayed) Labs Reviewed  CBC - Abnormal; Notable for the following components:      Result Value   Hemoglobin 9.2 (*)    HCT 33.0 (*)    MCH 23.3 (*)    MCHC 27.9 (*)    RDW 18.9 (*)    All other components within normal limits  COMPREHENSIVE METABOLIC PANEL - Abnormal; Notable for the following components:   Glucose, Bld 186 (*)    BUN 31 (*)    Creatinine, Ser 1.72 (*)    Calcium 8.2 (*)    Albumin 3.4 (*)    GFR, Estimated 31 (*)    All other components within normal limits  PROTIME-INR  APTT  DIFFERENTIAL  ETHANOL  CBG MONITORING, ED     EKG  EKG read and interpreted by me shows normal sinus rhythm rate of 61 left axis first-degree bundle branch block decreased R  wave progression R wave progression today is worse than it was on the first of the month.   RADIOLOGY CT read by radiology reviewed and interpreted by me shows chronic known strokes no acute changes  MRI and MRA show occlusion of the left ICA with some small embolic infarcts in the MCA territory likely coming from the ICA radiology tells me.  Discussed this with neurology Dr. Curly Shores who wants me to do a teleneurology consult that she is no longer on duty which I have put in.  She thinks that the patient likely has passed time to do anything and the patient did not have any deficits really on my exam. PROCEDURES:  Critical Care performed: [place] care time 45 minutes.  This includes repeated discussions with our neurologist and then with teleneurology all as well.  Procedures   MEDICATIONS ORDERED IN ED: Medications - No data to display   IMPRESSION / MDM / [place] / ED COURSE  I reviewed the triage vital signs and the nursing notes. ----------------------------------------- 6:54 PM on 05/19/2022 ----------------------------------------- Plan per neurology:Left a plan of  care note, to close the loop in brief:  Recommend MRI brain, MRA head If negative, continue home DAPT and consider event monitor on outpatient basis to rule out occult Afib If positive, admit for full neuro eval ----------------------------------------- 10:12 PM on 05/19/2022 ----------------------------------------- Teleneurology consult has been put in.  We call them now again to find out what is going on apparently they are looking for doctor to consult. ----------------------------------------- 10:46 PM on 05/19/2022 -----------------------------------------  Neurology calls back nothing to do interventionally.  Recommends permissive hypertension admit and Brilinta and then other workup for stroke i.e. telemonitoring for A-fib carotid echo cardiac echo etc.  Patient's presentation is most consistent with acute presentation with potential threat to life or bodily function.  The patient is on the cardiac monitor to evaluate for evidence of arrhythmia and/or significant heart rate changes.  None have been seen so far.    FINAL CLINICAL IMPRESSION(S) / ED DIAGNOSES   Final diagnoses:  Cerebrovascular accident (CVA), unspecified mechanism ([place])     Rx / DC Orders   ED Discharge Orders     None        Note:  This document was prepared using Dragon voice recognition software and may include unintentional dictation errors.   Nena Polio, MD 05/19/22 [phone]

## 2022-05-19 NOTE — Consult Note (Signed)
[facility] TeleNeurology Consult Services  Stat Consult  Patient Name:   [name], [name] Date of Birth:   Dec 12, 1950 Identification Number:   MRN - [id number] Date of Service:   05/19/2022 67:34:19  Diagnosis:       I63.412 - Cerebrovascular accident (CVA) due to embolism of left middle cerebral artery ([place])  Impression 71yo F with HTN, CAD s/p CABG, diabetes, prior CVA (no residual), presenting with right sided weakness. LKW yesterday morning so not an interventional candidate. NIHSS of 2. CT negative. MRI/A notable for punctate foci of acute ischemic in the left frontal lobe along with complete occlusion of the left ICA.   Recommendations: Our recommendations are outlined below.  Diagnostic Studies : TTE - tele to evaluate for A fib  Laboratory Studies : Lipid panel Please order Hemoglobin A1c Please order  Antithrombotic Medication : Aspirin 81 mg PO daily Please order Permissive hypertension, Antihypertensives with prn for first 24-48 hrs post stroke onset. If BP greater than 220/120 give Labetalol IV or Vasotec IV Please order Statins for LDL goal less than 70 Please order Brilinta 90mg  bid starting in AM (due to reported Plavix intolerance)  Nursing Recommendations : IV Fluids, avoid dextrose containing fluids, Maintain euglycemiaNeuro checks q4 hrs x 24 hrs and then per shiftHead of bed 30 degreesContinue with Telemetry  Consultations : Recommend Speech therapy if failed dysphagia screenPhysical therapy/Occupational therapy  DVT Prophylaxis : Choice of Primary Team  Disposition : Neurology will follow   ----------------------------------------------------------------------------------------------------    Metrics: [facility] Notification Time: 05/19/2022 20:25:31 Stamp Time: 05/19/2022 73:90:24 Callback Response Time: 05/19/2022 20:28:37  Primary Provider Notified of Diagnostic Impression and Management Plan on: 05/19/2022  22:46:24   CT HEAD: As Per Radiologist CT Head Showed No Acute Hemorrhage or Acute Core Infarct    ----------------------------------------------------------------------------------------------------  Chief Complaint: right sided weakness  History of Present Illness: Patient is a 71 year old Female. 71yo F with HTN, CAD s/p CABG, diabetes, prior CVA (no residual), presenting with right sided weakness. LKW yesterday morning. She reports dropping items from her right hand and feels numb on her right side. No falls/trauma. Denies vision or speech issues at this time. She takes an aspirin daily. CT head negative, MRI/A notable for punctate foci of acute ischemic in the left frontal lobe along with complete occlusion of the left ICA.    Past Medical History:      Hypertension      Diabetes Mellitus      Coronary Artery Disease      Stroke  Medications:  No Anticoagulant use  Antiplatelet use: Yes aspirin Reviewed EMR for current medications  Allergies:  Reviewed  Social History: Smoking: No Alcohol Use: No  Family History:  There is no family history of premature cerebrovascular disease pertinent to this consultation  ROS : 14 Points Review of Systems was performed and was negative except mentioned in HPI.  Past Surgical History: There Is No Surgical History Contributory To Today's Visit    Examination: BP(191/79), Pulse(76), Blood Glucose(186) 1A: Level of Consciousness - Alert; keenly responsive + 0 1B: Ask Month and Age - Both Questions Right + 0 1C: Blink Eyes & Squeeze Hands - Performs Both Tasks + 0 2: Test Horizontal Extraocular Movements - Normal + 0 3: Test Visual Fields - No Visual Loss + 0 4: Test Facial Palsy (Use Grimace if Obtunded) - Normal symmetry + 0 5A: Test Left Arm Motor Drift - No Drift for 10 Seconds + 0 5B: Test Right Arm Motor  Drift - No Drift for 10 Seconds + 0 6A: Test Left Leg Motor Drift - No Drift for 5 Seconds + 0 6B: Test Right Leg  Motor Drift - Drift, but doesn't hit bed + 1 7: Test Limb Ataxia (FNF/Heel-Shin) - No Ataxia + 0 8: Test Sensation - Mild-Moderate Loss: Less Sharp/More Dull + 1 9: Test Language/Aphasia - Normal; No aphasia + 0 10: Test Dysarthria - Normal + 0 11: Test Extinction/Inattention - No abnormality + 0  NIHSS Score: 2  Spoke with : Dr. Cinda Quest    Patient / Family was informed the Neurology Consult would occur via TeleHealth consult by way of interactive audio and video telecommunications and consented to receiving care in this manner.  Patient is being evaluated for possible acute neurologic impairment and high probability of imminent or life - threatening deterioration.I spent total of 35 minutes providing care to this patient, including time for face to face visit via telemedicine, review of medical records, imaging studies and discussion of findings with providers, the patient and / or family.   Dr Morrison Old   [facility] For Inpatient follow-up with [facility] physician please call [facility] [phone]. This is not an outpatient service. Post hospital discharge, please contact hospital directly.  Please do not communicate with [facility] physicians via secure chat. If you have any questions, Please contact [facility].

## 2022-05-19 NOTE — H&P (Signed)
History and Physical    Chief Complaint: RUE weakness.   HISTORY OF PRESENT ILLNESS: [name] is an 71 y.o. female complains of right hand weakness and difficulty lifting. Last known well was yesterday am and therefore not a  thrombolytic candidate. Per report patient had a bleed 10 days ago and since then her speech has been off, and her hand Feels like it is not her hand.  No vision gait or any other abnormalities or symptoms. Patient is alert awake and oriented. Neuro exam is nonfocal. [name] noncontrast is negative for any bleeds but does show history of scattered cerebellar infarcts. MRI/A notable for punctate foci of acute ischemic in the left frontal lobe along with complete occlusion of the left ICA.  Code stroke was called and pt seen by neurology . Admission request for stroke. Pt has had cva in past and last one was in July 2023.   Pt has  Past Medical History:  Diagnosis Date   Acute CVA (cerebrovascular accident) ([place]) 12/18/2021   Acute gastric ulcer without hemorrhage or perforation    Acute on chronic systolic CHF (congestive heart failure), NYHA class 3 ([place]) 08/03/2019   Formatting of this note might be different from the original. Global ef 35%   Anemia    Anemia, iron deficiency 11/07/2013   Arthritis    CAD (coronary artery disease)    Cerebrovascular accident [facility])    CHF (congestive heart failure) ([place])    Chicken pox    Chronic kidney disease    Colitis    ? 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  All other systems reviewed and are negative.  Allergies  Allergen Reactions   Niacin Other (See Comments)    Heart Attack Other reaction(s): Other (see comments) Heart Attack   Tramadol     Other reaction(s): Other (see comments) Intolerance, makes woozy   Ultram [Tramadol Hcl]     Intolerance, makes woozy   Baclofen Other (See Comments)    Dizziness Other reaction(s): Other (see comments) Dizziness   Cardura [Doxazosin Mesylate] Other (See Comments)    Dizzy with passing out spells   Doxazosin     Other reaction(s): Other (see comments) Dizzy with passing out spells Dizzy with passing out spells   Morphine And Related Nausea And Vomiting and Nausea Only   Other Rash    Gold metal (nickel in the gold) Gold metal Gold metal (nickel in the gold)   Past Surgical History:  Procedure Laterality Date   ABDOMINAL HYSTERECTOMY     CHOLECYSTECTOMY     COLONOSCOPY WITH PROPOFOL N/A 12/24/2014   Procedure: COLONOSCOPY WITH PROPOFOL;  Surgeon: Hulen Luster, MD;  Location: [facility] ENDOSCOPY;  Service: Gastroenterology;  Laterality: N/A;   CORONARY ARTERY BYPASS GRAFT     ESOPHAGOGASTRODUODENOSCOPY N/A 09/02/2017   Procedure: ESOPHAGOGASTRODUODENOSCOPY (EGD);  Surgeon: Virgel Manifold, MD;  Location: [facility] ENDOSCOPY;  Service: Endoscopy;  Laterality: N/A;   ESOPHAGOGASTRODUODENOSCOPY (EGD) WITH PROPOFOL N/A 12/24/2014   Procedure: ESOPHAGOGASTRODUODENOSCOPY (EGD) WITH PROPOFOL;  Surgeon: Hulen Luster, MD;  Location: [facility] ENDOSCOPY;  Service: Gastroenterology;  Laterality: N/A;   JOINT REPLACEMENT     LOWER EXTREMITY ANGIOGRAPHY Left 07/17/2019   Procedure: Lower Extremity Angiography;  Surgeon: Algernon Huxley, MD;  Location: [place] CV LAB;   Service: Cardiovascular;  Laterality: Left;   ROUX-EN-Y GASTRIC BYPASS     loss over 100 pounds   TONSILLECTOMY     TOTAL KNEE ARTHROPLASTY Left        MEDICATIONS: Current Outpatient Medications  Medication Instructions   aspirin EC 81 mg, Oral, Daily   blood glucose meter kit and supplies KIT Dispense based on patient and insurance preference. Use up to four times daily as directed.   clopidogrel (PLAVIX) 75 mg, Oral, Daily   diphenhydrAMINE (BENADRYL) 50 mg, Oral, At bedtime PRN   furosemide (LASIX) 40 mg, Oral, Daily   levothyroxine (SYNTHROID) 200 mcg, Oral, As directed, Take 1 tablet (200 mcg total) by mouth every Monday, Wednesday, and Friday Take on an empty stomach with a glass of water at least 30-60 minutes before breakfast.   linagliptin (TRADJENTA) 5 mg, Oral, Daily   lovastatin (MEVACOR) 40 mg, Oral, Daily   Multiple Vitamin (MULTIVITAMIN WITH MINERALS) TABS tablet 1 tablet, Oral, Daily   pantoprazole (PROTONIX) 20 mg, Oral, Daily   potassium chloride (KLOR-CON) 10 MEQ tablet 20 mEq, Oral, Daily    ED Course: Pt in Ed is alert, awake and oriented. Vitals:   05/19/22 2311 05/19/22 2330 05/20/22 0000 05/20/22 0030  BP: (!) 185/71 (!) 169/72 (!) 163/63 (!) 147/65  Pulse: 70 71 64 61  Resp: _0 Temp: 98 F (36.7 C)     TempSrc: Oral     SpO2: 98% 99% 96% 96%  Weight:      Height:      Vitals are stable. No intake/output data recorded. SpO2: 96 % Blood work in ed shows: Anemia / CKD and Results for orders placed or performed during the hospital encounter of 05/19/22 (from the past 24 hour(s))  Protime-INR     Status: None   Collection Time: 05/19/22  3:54 PM  Result Value Ref Range   Prothrombin Time 14.3 11.4 - 15.2 seconds   INR 1.1 0.8 - 1.2  APTT     Status: None   Collection Time: 05/19/22  3:54 PM  Result Value Ref Range   aPTT 29 24 - 36 seconds  CBC     Status: Abnormal   Collection Time: 05/19/22  3:54 PM  Result Value Ref Range   WBC  5.4 4.0 - 10.5 K/uL   RBC 3.95 3.87 - 5.11 MIL/uL   Hemoglobin 9.2 (L) 12.0 - 15.0 g/dL   HCT 33.0 (L) 36.0 - 46.0 %   MCV 83.5 80.0 - 100.0 fL   MCH 23.3 (L) 26.0 - 34.0 pg   MCHC 27.9 (L) 30.0 - 36.0 g/dL   RDW 18.9 (H) 11.5 - 15.5 %   Platelets 327 150 - 400 K/uL   nRBC 0.0 0.0 - 0.2 %  Differential     Status: None   Collection Time: 05/19/22  3:54 PM  Result Value Ref Range  Neutrophils Relative % 62 %   Neutro Abs 3.4 1.7 - 7.7 K/uL   Lymphocytes Relative 26 %   Lymphs Abs 1.4 0.7 - 4.0 K/uL   Monocytes Relative 9 %   Monocytes Absolute 0.5 0.1 - 1.0 K/uL   Eosinophils Relative 2 %   Eosinophils Absolute 0.1 0.0 - 0.5 K/uL   Basophils Relative 1 %   Basophils Absolute 0.0 0.0 - 0.1 K/uL   Immature Granulocytes 0 %   Abs Immature Granulocytes 0.01 0.00 - 0.07 K/uL  Comprehensive metabolic panel     Status: Abnormal   Collection Time: 05/19/22  3:54 PM  Result Value Ref Range   Sodium 138 135 - 145 mmol/L   Potassium 4.2 3.5 - 5.1 mmol/L   Chloride 108 98 - 111 mmol/L   CO2 25 22 - 32 mmol/L   Glucose, Bld 186 (H) 70 - 99 mg/dL   BUN 31 (H) 8 - 23 mg/dL   Creatinine, Ser 1.72 (H) 0.44 - 1.00 mg/dL   Calcium 8.2 (L) 8.9 - 10.3 mg/dL   Total Protein 6.9 6.5 - 8.1 g/dL   Albumin 3.4 (L) 3.5 - 5.0 g/dL   AST 15 15 - 41 U/L   ALT 9 0 - 44 U/L   Alkaline Phosphatase 101 38 - 126 U/L   Total Bilirubin 0.4 0.3 - 1.2 mg/dL   GFR, Estimated 31 (L) >60 mL/min   Anion gap 5 5 - 15  Ethanol     Status: None   Collection Time: 05/19/22  3:54 PM  Result Value Ref Range   Alcohol, Ethyl (B) <10 <10 mg/dL    Unresulted Labs (From admission, onward)     Start     Ordered   05/20/22 0500  Lipid panel  (Labs)  Tomorrow morning,   URGENT       Comments: Fasting    05/20/22 0111   05/20/22 0104  Urine rapid drug screen (hosp performed)not at [facility]  (Labs)  Once,   STAT        05/20/22 0111   05/20/22 0103  Hemoglobin A1c  Once,   URGENT       Comments: To assess prior  glycemic control    05/20/22 0103           Pt has received : Orders Placed This Encounter  Procedures   [name] WO CONTRAST    [name] - for Non-Code Stroke Patient    Standing Status:   Standing    Number of Occurrences:   1    Order Specific Question:   Radiology Contrast Protocol - do NOT remove file path    Answer:   [email or web address]   MR BRAIN WO CONTRAST    Standing Status:   Standing    Number of Occurrences:   1    Order Specific Question:   What is the patient's sedation requirement?    Answer:   No Sedation    Order Specific Question:   Does the patient have a pacemaker or implanted devices?    Answer:   No    Order Specific Question:   Radiology Contrast Protocol - do NOT remove file path    Answer:   [email or web address]   MR ANGIO Wright WO CONTRAST    Standing Status:   Standing    Number of Occurrences:   1    Order Specific Question:   What is the patient's sedation requirement?    Answer:   No  Sedation    Order Specific Question:   Does the patient have a pacemaker or implanted devices?    Answer:   No   Protime-INR    Standing Status:   Standing    Number of Occurrences:   1   APTT    Standing Status:   Standing    Number of Occurrences:   1   CBC    Standing Status:   Standing    Number of Occurrences:   1   Differential    Standing Status:   Standing    Number of Occurrences:   1   Comprehensive metabolic panel    Standing Status:   Standing    Number of Occurrences:   1   Ethanol    Standing Status:   Standing    Number of Occurrences:   1   Hemoglobin A1c    To assess prior glycemic control    Standing Status:   Standing    Number of Occurrences:   1   Urine rapid drug screen (hosp performed)not at [facility]    Standing Status:   Standing    Number of Occurrences:   1   Lipid panel    Fasting    Standing Status:   Standing    Number of Occurrences:   1   Diet  Carb Modified Fluid consistency: Thin; Room service appropriate? Yes    Standing Status:   Standing    Number of Occurrences:   1    Order Specific Question:   Diet-HS Snack?    Answer:   Nothing    Order Specific Question:   Calorie Level    Answer:   Medium 1600-2000    Order Specific Question:   Fluid consistency:    Answer:   Thin    Order Specific Question:   Room service appropriate?    Answer:   Yes   Cardiac monitoring    Standing Status:   Standing    Number of Occurrences:   1   Swallow screen (NPO until completed)    (NPO until completed)    Standing Status:   Standing    Number of Occurrences:   1   NIH Stroke Scale    Standing Status:   Standing    Number of Occurrences:   1   Modified Stroke Scale (mNIHSS) Document mNIHSS assessment every 2 hours for a total of 12 hours    Document mNIHSS assessment every 2 hours for a total of 12 hours    Standing Status:   Standing    Number of Occurrences:   1   Saline Lock IV, Maintain IV access    Standing Status:   Standing    Number of Occurrences:   1   If O2 sat    Standing Status:   Standing    Number of Occurrences:   1   Apply Diabetes Mellitus Care Plan    Standing Status:   Standing    Number of Occurrences:   1   STAT CBG when hypoglycemia is suspected. If treated, recheck every 15 minutes after each treatment until CBG >/= 70 mg/dl    Standing Status:   Standing    Number of Occurrences:   1   Refer to Hypoglycemia Protocol Sidebar Report for treatment of CBG < 70 mg/dl    Standing Status:   Standing    Number of Occurrences:   1   No HS correction Insulin  Standing Status:   Standing    Number of Occurrences:   1   No meal coverage at this time.    Standing Status:   Standing    Number of Occurrences:   1   NIHSS score documentation NIHSS score range: 0-42    NIHSS score range: 0-42    Standing Status:   Standing    Number of Occurrences:   1   Vital signs    Standing Status:   Standing    Number of  Occurrences:   1   Modified Stroke Scale (mNIHSS) q 4 hours    q 4 hours    Standing Status:   Standing    Number of Occurrences:   1   Notify physician (specify)    Standing Status:   Standing    Number of Occurrences:   20    Order Specific Question:   Notify Physician    Answer:   change in neurologic status based on Neurologic Worsening protocol    Order Specific Question:   Notify Physician    Answer:   new onset dysrhythmia    Order Specific Question:   Notify Physician    Answer:   Systolic BP > 409 (not controlled by PRN continuous infusion medications) or less than 100    Order Specific Question:   Notify Physician    Answer:   Pox < 88% despite the use of supplemental O2 via nasal cannula    Order Specific Question:   Notify Physician    Answer:   Heart Rate > 120 or less than 50    Order Specific Question:   Notify Physician    Answer:   Respiratory Rate > 30    Order Specific Question:   Notify Physician    Answer:   Blood Glucose > 15m/dl or less than 630mdl    Order Specific Question:   Notify Physician    Answer:   Temperature goal of 37 C (98.6 F). Acceptable range: 36.5 C - 37.5 C (97.7 F - 99.5 F)    Order Specific Question:   Notify Physician    Answer:   Urine output > 300 ml in 1 hour or less than 30 ml in 4 hours (unless anuric)   OOB with assistance    Standing Status:   Standing    Number of Occurrences:   [phone] Activity as tolerated    Standing Status:   Standing    Number of Occurrences:   1   Swallow screen - If patient does NOT pass this screen, place order for SLP eval and treat (SLP2) - swallowing evaluation (BSE, MBS and/or diet order as indicated)    - If patient does NOT pass this screen, place order for SLP eval and treat (SLP2) - swallowing evaluation (BSE, MBS and/or diet order as indicated)    Standing Status:   Standing    Number of Occurrences:   1   NIH stroke scale    Document NIHSS on arrival to unit and each shift    Standing  Status:   Standing    Number of Occurrences:   1   Intake and output    Avoid use of indwelling catheter    Standing Status:   Standing    Number of Occurrences:   1   Cardiac Monitoring Continuous x 24 hours Indications for use: Acute neurological event    Standing Status:   Standing    Number of  Occurrences:   1    Order Specific Question:   Indications for use:    Answer:   Acute neurological event   Apply Stroke Care Plan: Ischemic Stroke, TIA    Standing Status:   Standing    Number of Occurrences:   1   Discuss with patient and document patient's goals for stroke risk factor reduction    Standing Status:   Standing    Number of Occurrences:   1   Initiate Oral Care Protocol    Standing Status:   Standing    Number of Occurrences:   1   Initiate Carrier Fluid Protocol    Standing Status:   Standing    Number of Occurrences:   1   Provide stroke education material to patient and family.    Standing Status:   Standing    Number of Occurrences:   1   Nurse to provide smoking / tobacco cessation education    Standing Status:   Standing    Number of Occurrences:   1   If the patient has passed the Stroke Swallow Screen or has a feeding tube, then RN may order General Admission PRN Orders (through manage orders) for the following patient needs: allergy symptoms (Claritin), cold sores (Carmex), cough (Robitussin DM), eye irritation (Liquifilm Tears), hemorrhoids (Tucks), indigestion (Maalox), minor skin irritation (hydrocortisone cream), muscle pain Suezanne Jacquet Gay), nose irritation (saline nasal spray) and sore throat (Chloraseptic spray).    Standing Status:   Standing    Number of Occurrences:   [phone]   Full code    Standing Status:   Standing    Number of Occurrences:   1   Call/consult TeleNeurology  Consult Timeframe: STAT - requires a response within one hour; STAT timeframe requires provider to provider communication, has the provider to provider communication been completed: Yes;  Reason for Consult? have not spo...    Standing Status:   Standing    Number of Occurrences:   1    Order Specific Question:   Consult Timeframe    Answer:   STAT - requires a response within one hour    Order Specific Question:   STAT timeframe requires provider to provider communication, has the provider to provider communication been completed    Answer:   Yes    Order Specific Question:   Reason for Consult?    Answer:   have not spoken with this provider patient w pos mra/mri request consult to address this and speak with neurologist   Consult to hospitalist  Neurology should be putting a note in.  They recommend no intervention but adding Brilinta, permissive hypertension, carotid echo, cardiac monitoring possible heart echo.    Neurology should be putting a note in.  They recommend no intervention but adding Brilinta, permissive hypertension, carotid echo, cardiac monitoring possible heart echo.    Standing Status:   Standing    Number of Occurrences:   1    Order Specific Question:   Place call to:    Answer:   Hospitalist    Order Specific Question:   Reason for Consult    Answer:   Admit    Order Specific Question:   Diagnosis/Clinical Info for Consult:    Answer:   CVA   Consult to Registered Dietitian    Standing Status:   Standing    Number of Occurrences:   1    Order Specific Question:   Reason for consult?    Answer:  Assessment of nutrition requirements/status   Consult to Transition of Care Team    Standing Status:   Standing    Number of Occurrences:   1    Order Specific Question:   Reason for Consult:    Answer:   Home Health / DME Needs    Order Specific Question:   Reason for Consult:    Answer:   SNF placement   OT eval and treat    Standing Status:   Standing    Number of Occurrences:   1   PT eval and treat    Standing Status:   Standing    Number of Occurrences:   1   Pulse oximetry, continuous    Standing Status:   Standing    Number of Occurrences:    1   Oxygen therapy Mode or (Route): Nasal cannula; Liters Per Minute: 2; Keep 02 saturation: greater than 94 %    Standing Status:   Standing    Number of Occurrences:   1    Order Specific Question:   Mode or (Route)    Answer:   Nasal cannula    Order Specific Question:   Liters Per Minute    Answer:   2    Order Specific Question:   Keep 02 saturation    Answer:   greater than 94 %   SLP eval and treat Reason for evaluation: Cognitive/Language evaluation    Standing Status:   Standing    Number of Occurrences:   1    Order Specific Question:   Reason for evaluation    Answer:   Cognitive/Language evaluation   CBG monitoring, ED    Standing Status:   Standing    Number of Occurrences:   1   ED EKG    Altered mental status    Standing Status:   Standing    Number of Occurrences:   1    Order Specific Question:   Reason for Exam    Answer:   Other (See Comments)   ECHOCARDIOGRAM COMPLETE BUBBLE STUDY    Standing Status:   Standing    Number of Occurrences:   1    Order Specific Question:   Perflutren DEFINITY (image enhancing agent) should be administered unless hypersensitivity or allergy exist    Answer:   Administer Perflutren    Order Specific Question:   Will [facility] be the location of this test?    Answer:   Yes    Order Specific Question:   Please indicate who you request to read the nuc med / echo results.    Answer:   [facility]    Order Specific Question:   Reason for exam    Answer:   Stroke 434.91 / I63.9   Place in observation (patient's expected length of stay will be less than 2 midnights)    Standing Status:   Standing    Number of Occurrences:   1    Order Specific Question:   Hospital Area    Answer:   [place] [[id number]]    Order Specific Question:   Level of Care    Answer:   Progressive [102]    Order Specific Question:   Admit to Progressive based on following criteria    Answer:   NEUROLOGICAL AND NEUROSURGICAL  complex patients with significant risk of instability, who do not meet ICU criteria, yet require close observation or frequent assessment (< / = every 2 -  4 hours) with medical / nursing intervention.    Order Specific Question:   Covid Evaluation    Answer:   Asymptomatic - no recent exposure (last 10 days) testing not required    Order Specific Question:   Diagnosis    Answer:   Weakness of right upper extremity [[id number]]    Order Specific Question:   Admitting Physician    Answer:   Cherylann Ratel    Order Specific Question:   Attending Physician    Answer:   Cherylann Ratel   Fall precautions    Standing Status:   Standing    Number of Occurrences:   1   Aspiration precautions    Standing Status:   Standing    Number of Occurrences:   1    Meds ordered this encounter  Medications   DISCONTD: sodium chloride flush (NS) 0.9 % injection 3 mL   aspirin EC tablet 81 mg   levothyroxine (SYNTHROID) tablet 200 mcg    Take 1 tablet (200 mcg total) by mouth every Monday, Wednesday, and Friday Take on an empty stomach with a glass of water at least 30-60 minutes before breakfast.     pravastatin (PRAVACHOL) tablet 40 mg   ticagrelor (BRILINTA) tablet 90 mg   insulin aspart (novoLOG) injection 0-15 Units    Order Specific Question:   Correction coverage:    Answer:   Moderate (average weight, post-op)    Order Specific Question:   CBG < 70:    Answer:   implement hypoglycemia protocol    Order Specific Question:   CBG 70 - 120:    Answer:   0 units    Order Specific Question:   CBG 121 - 150:    Answer:   2 units    Order Specific Question:   CBG 151 - 200:    Answer:   3 units    Order Specific Question:   CBG 201 - 250:    Answer:   5 units    Order Specific Question:   CBG 251 - 300:    Answer:   8 units    Order Specific Question:   CBG 301 - 350:    Answer:   11 units    Order Specific Question:   CBG 351 - 400:    Answer:   15 units    Order Specific Question:    CBG > 400    Answer:   call MD and obtain STAT lab verification    stroke: early stages of recovery book   0.9 %  sodium chloride infusion   OR Linked Order Group    acetaminophen (TYLENOL) tablet 650 mg    acetaminophen (TYLENOL) 160 MG/5ML solution 650 mg    acetaminophen (TYLENOL) suppository 650 mg   heparin injection 5,000 Units     Admission Imaging : MR BRAIN WO CONTRAST Result Date: 05/19/2022 CLINICAL DATA:  Right hand weakness EXAM: MRI Wright WITHOUT CONTRAST MRA Wright WITHOUT CONTRAST TECHNIQUE: Multiplanar, multi-echo pulse sequences of the brain and surrounding structures were acquired without intravenous contrast. Angiographic images of the Circle of Willis were acquired using MRA technique without intravenous contrast. COMPARISON:  None Available. FINDINGS: MRI Wright FINDINGS Brain: There are 3 punctate foci of acute ischemia in the posterior left frontal lobe. No acute or chronic hemorrhage. There is multifocal hyperintense T2-weighted signal within the white matter. Parenchymal volume and CSF spaces are normal. The midline structures are normal. Vascular: Loss  of the normal left ICA flow void, new since 04/07/2022 Skull and upper cervical spine: Normal calvarium and skull base. Visualized upper cervical spine and soft tissues are normal. Sinuses/Orbits:No paranasal sinus fluid levels or advanced mucosal thickening. No mastoid or middle ear effusion. Normal orbits. MRA Wright FINDINGS POSTERIOR CIRCULATION: --Vertebral arteries: Normal --Inferior cerebellar arteries: Normal. --Basilar artery: Normal. --Superior cerebellar arteries: Normal. --Posterior cerebral arteries: Normal. 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 Right ICA is normal. There is flow related enhancement at the left ICA terminus due to collateral flow. --Anterior cerebral arteries (ACA): Normal. --Middle cerebral arteries (MCA): Normal. ANATOMIC VARIANTS: None  IMPRESSION:   1. Three punctate foci of acute ischemia in the posterior left frontal lobe. No hemorrhage or mass effect. 2. 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 The left middle cerebral artery remains patent. Critical Value/emergent results were called by telephone at the time of interpretation on 05/19/2022 at 757 pm to provider [facility] , who verbally acknowledged these results. Electronically Signed   By: Ulyses Jarred M.D.   On: 05/19/2022 20:09   MR ANGIO Wright WO CONTRAST Result Date: 05/19/2022 CLINICAL DATA:  Right hand weakness EXAM: MRI Wright WITHOUT CONTRAST MRA Wright WITHOUT CONTRAST TECHNIQUE: Multiplanar, multi-echo pulse sequences of the brain and surrounding structures were acquired without intravenous contrast. Angiographic images of the Circle of Willis were acquired using MRA technique without intravenous contrast. COMPARISON:  None Available. FINDINGS: MRI Wright FINDINGS Brain: There are 3 punctate foci of acute ischemia in the posterior left frontal lobe. No acute or chronic hemorrhage. There is multifocal hyperintense T2-weighted signal within the white matter. Parenchymal volume and CSF spaces are normal. The midline structures are normal. Vascular: Loss of the normal left ICA flow void, new since 04/07/2022 Skull and upper cervical spine: Normal calvarium and skull base. Visualized upper cervical spine and soft tissues are normal. Sinuses/Orbits:No paranasal sinus fluid levels or advanced mucosal thickening. No mastoid or middle ear effusion. Normal orbits. MRA Wright FINDINGS POSTERIOR CIRCULATION: --Vertebral arteries: Normal --Inferior cerebellar arteries: Normal. --Basilar artery: Normal. --Superior cerebellar arteries: Normal. --Posterior cerebral arteries: Normal. 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 Right ICA is normal. There is flow related enhancement at the left ICA  terminus due to collateral flow. --Anterior cerebral arteries (ACA): Normal. --Middle cerebral arteries (MCA): Normal. ANATOMIC VARIANTS: None IMPRESSION:  1. Three punctate foci of acute ischemia in the posterior left frontal lobe. No hemorrhage or mass effect. 2. 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 The left middle cerebral artery remains patent. Critical Value/emergent results were called by telephone at the time of interpretation on 05/19/2022 at 757 pm to provider [facility] , who verbally acknowledged these results. Electronically Signed   By: Ulyses Jarred M.D.   On: 05/19/2022 20:09   [name] WO CONTRAST Result Date: 05/19/2022 CLINICAL DATA:  Provided history: Neuro deficit, acute, stroke suspected. Additional history provided: Right hand weakness. Additional history provided: Patient reports recent "bleed" with subsequent speech difficulty. Patient also reports feeling as though "my hand is not my hand." EXAM: [name] WITHOUT CONTRAST TECHNIQUE: Contiguous axial images were obtained from the base of the skull through the vertex without intravenous contrast. RADIATION DOSE REDUCTION: This exam was performed according to the departmental dose-optimization program which includes automated exposure control, adjustment of the mA  and/or kV according to patient size and/or use of iterative reconstruction technique. COMPARISON:  Brain MRI 04/07/2022. Wright CT 04/07/2022. FINDINGS: Brain: No age advanced or lobar predominant parenchymal atrophy. Mild chronic small vessel ischemic changes within the cerebral white matter, better appreciated on the prior brain MRI of 04/07/2022. Known small chronic infarcts within both cerebellar hemispheres, most of which were also better appreciated on the prior MRI. There is no acute intracranial hemorrhage. No demarcated cortical infarct. No extra-axial fluid collection. No evidence of an intracranial  mass. No midline shift. Vascular: No hyperdense vessel. Atherosclerotic calcifications. Skull: No fracture or aggressive osseous lesion. Sinuses/Orbits: No mass or acute finding within the imaged orbits. No significant paranasal sinus disease at the imaged levels. IMPRESSION: 1. No evidence of acute intracranial abnormality. 2. Mild chronic small vessel ischemic changes within the cerebral white matter. 3. Known small chronic infarcts within bilateral cerebellar hemispheres. Electronically Signed   By: Kellie Simmering D.O.   On: 05/19/2022 16:11    Physical Examination: Vitals:   05/19/22 2311 05/19/22 2330 05/20/22 0000 05/20/22 0030  BP: (!) 185/71 (!) 169/72 (!) 163/63 (!) 147/65  Pulse: 70 71 64 61  Temp: 98 F (36.7 C)     Resp: _0 Height:      Weight:      SpO2: 98% 99% 96% 96%  TempSrc: Oral     BMI (Calculated):       Physical Exam Vitals and nursing note reviewed.  Constitutional:      General: She is not in acute distress.    Appearance: Normal appearance. She is obese. She is not ill-appearing, toxic-appearing or diaphoretic.  HENT:     Wright: Normocephalic and atraumatic.     Right Ear: Hearing and external ear normal.     Left Ear: Hearing and external ear normal.     Nose: No nasal deformity.     Mouth/Throat:     Lips: Pink.     Mouth: Mucous membranes are moist.     Tongue: No lesions.     Pharynx: Oropharynx is clear.  Eyes:     General: Lids are normal. Vision grossly intact. No visual field deficit.       Right eye: No discharge.     Extraocular Movements: Extraocular movements intact.     Right eye: Normal extraocular motion and no nystagmus.     Left eye: Normal extraocular motion and no nystagmus.     Pupils: Pupils are equal, round, and reactive to light.  Neck:     Thyroid: No thyromegaly.     Trachea: Trachea and phonation normal.  Cardiovascular:     Rate and Rhythm: Normal rate and regular rhythm.     Pulses: Normal pulses.           Dorsalis pedis pulses are 2+ on the right side and 2+ on the left side.       Posterior tibial pulses are 2+ on the right side and 2+ on the left side.     Heart sounds: Murmur heard.     Systolic murmur is present with a grade of 3/6.  Pulmonary:     Effort: Pulmonary effort is normal.     Breath sounds: Normal breath sounds.  Abdominal:     General: Bowel sounds are normal. There is no distension.     Palpations: Abdomen is soft. There is no mass.     Tenderness: There is no abdominal tenderness. There is no guarding.  Hernia: No hernia is present.  Musculoskeletal:     Cervical back: Full passive range of motion without pain.     Right lower leg: No edema.     Left lower leg: No edema.  Skin:    General: Skin is warm.  Neurological:     General: No focal deficit present.     Mental Status: She is alert and oriented to person, place, and time.     Cranial Nerves: Cranial nerves 2-12 are intact. No cranial nerve deficit, dysarthria or facial asymmetry.     Sensory: No sensory deficit.     Motor: Motor function is intact. No weakness or tremor.     Deep Tendon Reflexes:     Reflex Scores:      Bicep reflexes are 2+ on the right side and 2+ on the left side.      Patellar reflexes are 2+ on the right side and 2+ on the left side. Psychiatric:        Attention and Perception: Attention normal.        Mood and Affect: Mood normal.        Speech: Speech normal.        Behavior: Behavior normal. Behavior is cooperative.        Cognition and Memory: Cognition normal.      Assessment and Plan: * Weakness of right upper extremity Lipid panel Hemoglobin A1c    Antithrombotic Medication : Aspirin 81 mg PO daily  Permissive hypertension, Antihypertensives with prn for first 24-48 hrs post stroke onset. If BP greater than 220/120 give Labetalol IV or Vasotec IV Statins for LDL goal less than 70 Brilinta 18m bid starting in AM (due to reported Plavix intolerance)  MRI brain  shows  1. Three punctate foci of acute ischemia in the posterior left frontal lobe. No hemorrhage or mass effect. 2. 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 The left middle cerebral artery remains patent.    CAD, ARTERY BYPASS GRAFT No chest pain or sob. Pt has hi risk for vascular disease,due to her risk factor I diabetes/ heat attack and dva,. Aspirin start Brilinta in a.m. patient intolerant to Lipitor.   Hypothyroidism Abnormal TFTs a month ago. Will repeat until thyroid function test generally with thyroxine at current dose of 200 mcg daily.   Type 2 diabetes mellitus without complication (HCC) Glycemic protocol once pt passed's bedside swallow.  Regimen of tradjenta currently held.  Chronic kidney disease, stage III (moderate) (HCC) Lab Results  Component Value Date   CREATININE 1.72 (H) 05/19/2022   CREATININE 1.93 (H) 04/09/2022   CREATININE 2.38 (H) 04/07/2022      Latest Ref Rng & Units 05/19/2022    3:54 PM 04/09/2022    5:22 AM 04/07/2022    3:10 PM  CMP  Glucose 70 - 99 mg/dL 186  102  65   BUN 8 - 23 mg/dL 31  37  40   Creatinine 0.44 - 1.00 mg/dL 1.72  1.93  2.38   Sodium 135 - 145 mmol/L 138  143  139   Potassium 3.5 - 5.1 mmol/L 4.2  4.5  4.5   Chloride 98 - 111 mmol/L 108  120  115   CO2 22 - 32 mmol/L _0 Calcium 8.9 - 10.3 mg/dL 8.2  8.1  8.4   Total Protein 6.5 - 8.1 g/dL 6.9   6.7  Total Bilirubin 0.3 - 1.2 mg/dL 0.4   0.6   Alkaline Phos 38 - 126 U/L 101   93   AST 15 - 41 U/L 15   26   ALT 0 - 44 U/L 9   16    Avoid contrast study.    Anemia    Latest Ref Rng & Units 05/19/2022    3:54 PM 04/07/2022    3:10 PM 12/17/2021    4:23 PM  CBC  WBC 4.0 - 10.5 K/uL 5.4  7.4  6.6   Hemoglobin 12.0 - 15.0 g/dL 9.2  10.2  8.6   Hematocrit 36.0 - 46.0 % 33.0  35.4  29.6   Platelets 150 - 400 K/uL 327  293  288   Hb is low stable at 9.2    HYPERTENSION, BENIGN Vitals:    05/19/22 1545 05/19/22 1548 05/19/22 1807 05/19/22 1900  BP: 138/79 138/79 139/77 (!) 146/68   05/19/22 2030 05/19/22 2100 05/19/22 2130 05/19/22 2200  BP: (!) 158/85 (!) 149/68 (!) 151/64 (!) 149/64   05/19/22 2230 05/19/22 2311 05/19/22 2330  BP: (!) 191/79 (!) 185/71 (!) 169/72   Hold diuretic and bp meds.  Vasotec for SBP 220 and above.    DVT prophylaxis:  Heparin   Code Status:  Full Code   Family Communication:  Dymmel,Marie (Friend)  [phone]   Disposition Plan:  Home   Consults called:  Neurology  Admission status: Observation   Unit/ Expected LOS: Telemetry/ 1-2 days.    Para Skeans MD [facility]  6 PM- 2 AM. Please contact me via secure Chat 6 PM-2 AM. [phone]( Pager ) during [place]- 2 AM. To contact the [facility] Attending or Consulting provider [place] or covering provider during after hours [place], for this patient.   Check the care team in [facility] and look for a) attending/consulting [facility] provider listed and b) the [facility] team listed Log into [email or web address] and use [place]'s universal password to access. If you do not have the password, please contact the hospital operator. Locate the [facility] provider you are looking for under [facility] and page to a number that you can be directly reached. If you still have difficulty reaching the provider, please page the [facility] (Director on Call) for the Hospitalists listed on amion for assistance. [email or web address] 05/20/2022, 1:19 AM

## 2022-05-19 NOTE — Plan of Care (Addendum)
[name] is a 71 y.o.  woman with PMHx of stroke with residual right sided weakness, anemia, CAD s/ CABG, CHF with EF 30-35%, CKD, DM, hypothyroidism, headache, HTN, HLD, sleep apnea and prior roux-en-Y gastric bypass   She had some right arm flaccidity today which improved but then worsened again and now has only some very slight "heaviness" but no true weakness or other acute issues, based on curbside discussion w/ Dr. 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  She appears to be on chronic dual antiplatelet therapy.  She has had an event monitor in the past with Dr. Nehemiah Massed in March 2021 which was negative for atrial fibrillation  She recently presented in July with some left hand numbness on awakening with MRI showing a small abnormality in the right frontal lobe concerning for stroke versus artifact.  Full stroke workup was completed as below   Cartoid duplex 7/14 1. Mild (1-49%) stenosis proximal right internal carotid artery secondary to heterogenous atherosclerotic plaque. 2. Mild (1-49%) stenosis proximal left internal carotid artery secondary to heterogenous atherosclerotic plaque. 3. Vertebral arteries are patent with normal antegrade flow.  MRA head w/o 7/14 No large vessel occlusion. Mild multifocal atherosclerotic irregularity. Focal marked stenosis of left A2 ACA.  MRI brain w/o 7/13 1. Subtle 1.8 cm linear focus of diffusion signal abnormality involving the cortical/subcortical right frontal lobe as above. While this finding could potentially be artifactual in nature, a possible acute ischemic infarct is difficult to  exclude, particularly given the provided history. Correlation with physical exam and any potential symptomatology recommended. No associated hemorrhage or mass effect. 2. No other acute intracranial abnormality. 3. Age-related cerebral atrophy with mild chronic small vessel ischemic disease. Few small remote cerebellar infarcts.  ECHO: 1. Left ventricular ejection fraction, by estimation, is 30 to 35%. The  left ventricle has moderately decreased function. The left ventricle  demonstrates global hypokinesis. There is moderate asymmetric left  ventricular hypertrophy of the basal-septal  segment. Left ventricular diastolic parameters are consistent with Grade I  diastolic dysfunction (impaired relaxation).   2. Right ventricular systolic function is normal. The right ventricular  size is normal. Tricuspid regurgitation signal is inadequate for assessing  PA pressure.   3. Left atrial size was moderately dilated.   4. The mitral valve is normal in structure. Mild mitral valve  regurgitation. No evidence of mitral stenosis.   5. The aortic valve has an indeterminant number of cusps. Aortic valve  regurgitation is not visualized. No aortic stenosis is present.   6. There is borderline dilatation of the ascending aorta, measuring 37  mm.   7. The inferior vena cava is normal in size with greater than 50%  respiratory variability, suggesting right atrial pressure of 3 mmHg.

## 2022-05-20 ENCOUNTER — Observation Stay: Payer: Medicare Other

## 2022-05-20 ENCOUNTER — Observation Stay: Admit: 2022-05-20 | Payer: Medicare Other

## 2022-05-20 ENCOUNTER — Observation Stay (HOSPITAL_BASED_OUTPATIENT_CLINIC_OR_DEPARTMENT_OTHER)
Admit: 2022-05-20 | Discharge: 2022-05-20 | Disposition: A | Discharge status: Home / Self Care | Payer: Medicare Other | Attending: Internal Medicine | Admitting: Internal Medicine

## 2022-05-20 ENCOUNTER — Encounter: Payer: Self-pay | Admitting: Internal Medicine

## 2022-05-20 DIAGNOSIS — I6389 Other cerebral infarction: Secondary | ICD-10-CM | POA: Diagnosis not present

## 2022-05-20 DIAGNOSIS — I639 Cerebral infarction, unspecified: Secondary | ICD-10-CM | POA: Diagnosis not present

## 2022-05-20 DIAGNOSIS — I25810 Atherosclerosis of coronary artery bypass graft(s) without angina pectoris: Secondary | ICD-10-CM

## 2022-05-20 DIAGNOSIS — R29898 Other symptoms and signs involving the musculoskeletal system: Secondary | ICD-10-CM | POA: Diagnosis not present

## 2022-05-20 DIAGNOSIS — E039 Hypothyroidism, unspecified: Secondary | ICD-10-CM | POA: Diagnosis not present

## 2022-05-20 LAB — LIPID PANEL
Cholesterol: 140 mg/dL (ref 0–200)
HDL: 46 mg/dL (ref >40)
LDL Cholesterol: 75 mg/dL (ref 0–99)
Total CHOL/HDL Ratio: 3 RATIO
Triglycerides: 97 mg/dL (ref <150)
VLDL: 19 mg/dL (ref 0–40)

## 2022-05-20 LAB — ECHOCARDIOGRAM COMPLETE BUBBLE STUDY
AR max vel: 3.19 cm2
AV Area VTI: 3 cm2
AV Area mean vel: 2.55 cm2
AV Mean grad: 4 mmHg
AV Peak grad: 5.7 mmHg
Ao pk vel: 1.19 m/s
Area-P 1/2: 6.37 cm2
S' Lateral: 3.6 cm

## 2022-05-20 LAB — HEMOGLOBIN A1C
Hgb A1c MFr Bld: 6.8 % — ABNORMAL HIGH (ref 4.8–5.6)
Mean Plasma Glucose: 148 mg/dL

## 2022-05-20 LAB — CBG MONITORING, ED
Glucose-Capillary: 108 mg/dL — ABNORMAL HIGH (ref 70–99)
Glucose-Capillary: 152 mg/dL — ABNORMAL HIGH (ref 70–99)
Glucose-Capillary: 119 mg/dL — ABNORMAL HIGH (ref 70–99)

## 2022-05-20 LAB — TSH: TSH: 4.52 u[IU]/mL — ABNORMAL HIGH (ref 0.350–4.500)

## 2022-05-20 LAB — GLUCOSE, CAPILLARY: Glucose-Capillary: 143 mg/dL — ABNORMAL HIGH (ref 70–99)

## 2022-05-20 MED ORDER — ACETAMINOPHEN 650 MG RE SUPP: 650.0000 mg | RECTAL | Status: DC | PRN

## 2022-05-20 MED ORDER — ENALAPRILAT 1.25 MG/ML IV SOLN: 0.6250 mg | Freq: Four times a day (QID) | INTRAVENOUS | Status: DC

## 2022-05-20 MED ORDER — ASPIRIN 81 MG PO TBEC
81.0000 mg | DELAYED_RELEASE_TABLET | Freq: Every day | ORAL | Status: DC
  Administered 2022-05-20 – 2022-05-21 (×2): 81 mg via ORAL
  Filled 2022-05-20 (×2): qty 1

## 2022-05-20 MED ORDER — INSULIN ASPART 100 UNIT/ML IJ SOLN
0.0000 [IU] | Freq: Three times a day (TID) | INTRAMUSCULAR | Status: DC
  Administered 2022-05-20 – 2022-05-21 (×2): 3 [IU] via SUBCUTANEOUS
  Filled 2022-05-20 (×2): qty 1

## 2022-05-20 MED ORDER — SODIUM CHLORIDE 0.9 % IV SOLN: INTRAVENOUS | Status: DC

## 2022-05-20 MED ORDER — PRAVASTATIN SODIUM 20 MG PO TABS
40.0000 mg | ORAL_TABLET | Freq: Every day | ORAL | Status: DC
  Administered 2022-05-20: 40 mg via ORAL
  Filled 2022-05-20: qty 2

## 2022-05-20 MED ORDER — STROKE: EARLY STAGES OF RECOVERY BOOK
Freq: Once | Status: AC
  Administered 2022-05-21: 1

## 2022-05-20 MED ORDER — HYDRALAZINE HCL 20 MG/ML IJ SOLN: 10.0000 mg | Freq: Four times a day (QID) | INTRAMUSCULAR | Status: DC | PRN

## 2022-05-20 MED ORDER — TICAGRELOR 90 MG PO TABS
90.0000 mg | ORAL_TABLET | Freq: Two times a day (BID) | ORAL | Status: DC
  Administered 2022-05-20 – 2022-05-21 (×4): 90 mg via ORAL
  Filled 2022-05-20 (×4): qty 1

## 2022-05-20 MED ORDER — ACETAMINOPHEN 325 MG PO TABS
650.0000 mg | ORAL_TABLET | ORAL | Status: DC | PRN
  Administered 2022-05-21: 650 mg via ORAL
  Filled 2022-05-20: qty 2

## 2022-05-20 MED ORDER — LEVOTHYROXINE SODIUM 100 MCG PO TABS
200.0000 ug | ORAL_TABLET | ORAL | Status: DC
  Administered 2022-05-20: 200 ug via ORAL
  Filled 2022-05-20: qty 4

## 2022-05-20 MED ORDER — ACETAMINOPHEN 160 MG/5ML PO SOLN: 650.0000 mg | ORAL | Status: DC | PRN

## 2022-05-20 MED ORDER — HEPARIN SODIUM (PORCINE) 5000 UNIT/ML IJ SOLN
5000.0000 [IU] | Freq: Two times a day (BID) | INTRAMUSCULAR | Status: DC
  Administered 2022-05-20 – 2022-05-21 (×4): 5000 [IU] via SUBCUTANEOUS
  Filled 2022-05-20 (×4): qty 1

## 2022-05-20 NOTE — Progress Notes (Signed)
*  PRELIMINARY RESULTS* Echocardiogram 2D Echocardiogram has been performed.  [name] 05/20/2022, 11:05 AM

## 2022-05-20 NOTE — Progress Notes (Signed)
PT Cancellation Note  Patient Details Name: [name] MRN: [id number] DOB: 03/16/1951   Cancelled Treatment:    Reason Eval/Treat Not Completed: PT screened, no needs identified, will sign off (Pt reports full resolution of RUE symptoms. Pt denies any associated RLE changes, or changes to balance, mobility, etc. No acute PT services warranted at this time, will sign off.) Pt reports AMB in room ad lib earlier, denies any concerns with mobility.   11:08 AM, 05/20/22 [name], PT, DPT Physical Therapist - [facility]  [phone] ([place])    Marleta Lapierre C 05/20/2022, 11:07 AM

## 2022-05-20 NOTE — Progress Notes (Signed)
[place] [facility] [place] NAME: [name]    MR#:  [id number]  DATE OF BIRTH:  1951/02/11  SUBJECTIVE:  patient came in with right upper extremity weakness. Overall she feels her weakness is back to baseline she is able to do fine motor activity. Denies any speech difficulties vision or any lower extremity weakness. No family at bedside.    VITALS:  Blood pressure (!) 160/89, pulse 67, temperature 98 F (36.7 C), temperature source Oral, resp. rate 16, height 5\' 6"  (1.676 m), weight 85.3 kg, SpO2 99 %.  PHYSICAL EXAMINATION:   GENERAL:  71 y.o.-year-old patient lying in the bed with no acute distress.  LUNGS: Normal breath sounds bilaterally, no wheezing CARDIOVASCULAR: S1, S2 normal. No murmur   ABDOMEN: Soft, nontender, nondistended. Bowel sounds present.  EXTREMITIES: No  edema b/l.    NEUROLOGIC: nonfocal  patient is alert and awake SKIN: No obvious rash, lesion, or ulcer.   LABORATORY PANEL:  CBC Recent Labs  Lab 05/19/22 1554  WBC 5.4  HGB 9.2*  HCT 33.0*  PLT 327    Chemistries  Recent Labs  Lab 05/19/22 1554  NA 138  K 4.2  CL 108  CO2 25  GLUCOSE 186*  BUN 31*  CREATININE 1.72*  CALCIUM 8.2*  AST 15  ALT 9  ALKPHOS 101  BILITOT 0.4   Cardiac Enzymes No results for input(s): "TROPONINI" in the last 168 hours. RADIOLOGY:  ECHOCARDIOGRAM COMPLETE BUBBLE STUDY  Result Date: 05/20/2022    ECHOCARDIOGRAM REPORT   Patient Name:   JENNESSA TRIGO Date of Exam: 05/20/2022 Medical Rec #:  [id number]      Height:       66.0 in Accession #:    [id number]     Weight:       188.0 lb Date of Birth:  [birth year]      BSA:          1.948 m Patient Age:    37 years       BP:           153/73 mmHg Patient Gender: F              HR:           67 bpm. Exam Location:  [facility] Procedure: 2D Echo, Color Doppler, Cardiac Doppler and Saline Contrast Bubble            Study Indications:     I63.9 Stroke  History:         Patient has prior  history of Echocardiogram examinations, most                  recent 12/19/2021. CHF, CAD, Stroke and PAD; Risk                  Factors:Diabetes, Hypertension, Dyslipidemia and Sleep Apnea.  Sonographer:     Charmayne Sheer Referring Phys:  [id number] Gretta Cool Edwina Grossberg Diagnosing Phys: Kate Sable MD  Sonographer Comments: No subcostal window and suboptimal apical window. IMPRESSIONS  1. Severe asymmetric LVH. no LVOT obstruction. consider CMR to evaluate HCM. Left ventricular ejection fraction, by estimation, is 35 to 40%. The left ventricle has moderately decreased function. The left ventricle demonstrates regional wall motion abnormalities (see scoring diagram/findings for description). There is severe asymmetric left ventricular hypertrophy of the septal segment. Left ventricular diastolic parameters are indeterminate. There is severe hypokinesis of the left ventricular, entire inferolateral wall.  2.  Right ventricular systolic function is normal. The right ventricular size is not well visualized.  3. The mitral valve is normal in structure. Mild mitral valve regurgitation.  4. The aortic valve was not well visualized. Aortic valve regurgitation is not visualized. FINDINGS  Left Ventricle: Severe asymmetric LVH. no LVOT obstruction. consider CMR to evaluate HCM. Left ventricular ejection fraction, by estimation, is 35 to 40%. The left ventricle has moderately decreased function. The left ventricle demonstrates regional wall motion abnormalities. Severe hypokinesis of the left ventricular, entire inferolateral wall. The left ventricular internal cavity size was normal in size. There is severe asymmetric left ventricular hypertrophy of the septal segment. Left ventricular diastolic parameters are indeterminate. Right Ventricle: The right ventricular size is not well visualized. No increase in right ventricular wall thickness. Right ventricular systolic function is normal. Left Atrium: Left atrial size was normal in size.  Right Atrium: Right atrial size was normal in size. Pericardium: There is no evidence of pericardial effusion. Mitral Valve: The mitral valve is normal in structure. Mild mitral valve regurgitation. Tricuspid Valve: The tricuspid valve is not well visualized. Tricuspid valve regurgitation is not demonstrated. Aortic Valve: The aortic valve was not well visualized. Aortic valve regurgitation is not visualized. Aortic valve mean gradient measures 4.0 mmHg. Aortic valve peak gradient measures 5.7 mmHg. Aortic valve area, by VTI measures 3.00 cm. Pulmonic Valve: The pulmonic valve was not well visualized. Pulmonic valve regurgitation is not visualized. Aorta: The aortic root and ascending aorta are structurally normal, with no evidence of dilitation. Venous: The inferior vena cava was not well visualized. IAS/Shunts: No atrial level shunt detected by color flow Doppler. Agitated saline contrast was given intravenously to evaluate for intracardiac shunting.  LEFT VENTRICLE PLAX 2D LVIDd:         4.40 cm   Diastology LVIDs:         3.60 cm   LV e' medial:    3.15 cm/s LV PW:         1.30 cm   LV E/e' medial:  20.0 LV IVS:        1.70 cm   LV e' lateral:   3.05 cm/s LVOT diam:     2.20 cm   LV E/e' lateral: 20.6 LV SV:         67 LV SV Index:   34 LVOT Area:     3.80 cm  LEFT ATRIUM             Index        RIGHT ATRIUM           Index LA diam:        5.90 cm 3.03 cm/m   RA Area:     12.90 cm LA Vol (A2C):   36.9 ml 18.94 ml/m  RA Volume:   28.80 ml  14.79 ml/m LA Vol (A4C):   51.8 ml 26.59 ml/m LA Biplane Vol: 43.8 ml 22.49 ml/m  AORTIC VALVE                    PULMONIC VALVE AV Area (Vmax):    3.19 cm     PV Vmax:       1.61 m/s AV Area (Vmean):   2.55 cm     PV Vmean:      105.000 cm/s AV Area (VTI):     3.00 cm     PV VTI:        0.295 m AV Vmax:  119.00 cm/s  PV Peak grad:  10.4 mmHg AV Vmean:          88.400 cm/s  PV Mean grad:  5.0 mmHg AV VTI:            0.222 m AV Peak Grad:      5.7 mmHg AV  Mean Grad:      4.0 mmHg LVOT Vmax:         99.80 cm/s LVOT Vmean:        59.400 cm/s LVOT VTI:          0.175 m LVOT/AV VTI ratio: 0.79  AORTA Ao Root diam: 3.10 cm MITRAL VALVE MV Area (PHT): 6.37 cm     SHUNTS MV Decel Time: 119 msec     Systemic VTI:  0.18 m MV E velocity: 62.90 cm/s   Systemic Diam: 2.20 cm MV A velocity: 112.00 cm/s MV E/A ratio:  0.56 Kate Sable MD Electronically signed by Kate Sable MD Signature Date/Time: 05/20/2022/3:29:51 PM    Final    MR BRAIN WO CONTRAST  Result Date: 05/19/2022 CLINICAL DATA:  Right hand weakness EXAM: MRI HEAD WITHOUT CONTRAST MRA HEAD WITHOUT CONTRAST TECHNIQUE: Multiplanar, multi-echo pulse sequences of the brain and surrounding structures were acquired without intravenous contrast. Angiographic images of the Circle of Willis were acquired using MRA technique without intravenous contrast. COMPARISON:  None Available. FINDINGS: MRI HEAD FINDINGS Brain: There are 3 punctate foci of acute ischemia in the posterior left frontal lobe. No acute or chronic hemorrhage. There is multifocal hyperintense T2-weighted signal within the white matter. Parenchymal volume and CSF spaces are normal. The midline structures are normal. Vascular: Loss of the normal left ICA flow void, new since 04/07/2022 Skull and upper cervical spine: Normal calvarium and skull base. Visualized upper cervical spine and soft tissues are normal. Sinuses/Orbits:No paranasal sinus fluid levels or advanced mucosal thickening. No mastoid or middle ear effusion. Normal orbits. MRA HEAD FINDINGS POSTERIOR CIRCULATION: --Vertebral arteries: Normal --Inferior cerebellar arteries: Normal. --Basilar artery: Normal. --Superior cerebellar arteries: Normal. --Posterior cerebral arteries: Normal. 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 Right ICA is normal. There is flow related enhancement at the left ICA terminus due to collateral flow.  --Anterior cerebral arteries (ACA): Normal. --Middle cerebral arteries (MCA): Normal. ANATOMIC VARIANTS: None IMPRESSION: 1. Three punctate foci of acute ischemia in the posterior left frontal lobe. No hemorrhage or mass effect. 2. 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 The left middle cerebral artery remains patent. Critical Value/emergent results were called by telephone at the time of interpretation on 05/19/2022 at 757 pm to provider [facility] , who verbally acknowledged these results. Electronically Signed   By: Ulyses Jarred M.D.   On: 05/19/2022 20:09   MR ANGIO HEAD WO CONTRAST  Result Date: 05/19/2022 CLINICAL DATA:  Right hand weakness EXAM: MRI HEAD WITHOUT CONTRAST MRA HEAD WITHOUT CONTRAST TECHNIQUE: Multiplanar, multi-echo pulse sequences of the brain and surrounding structures were acquired without intravenous contrast. Angiographic images of the Circle of Willis were acquired using MRA technique without intravenous contrast. COMPARISON:  None Available. FINDINGS: MRI HEAD FINDINGS Brain: There are 3 punctate foci of acute ischemia in the posterior left frontal lobe. No acute or chronic hemorrhage. There is multifocal hyperintense T2-weighted signal within the white matter. Parenchymal volume and CSF spaces are normal. The midline structures are normal. Vascular: Loss of the normal left ICA flow void, new  since 04/07/2022 Skull and upper cervical spine: Normal calvarium and skull base. Visualized upper cervical spine and soft tissues are normal. Sinuses/Orbits:No paranasal sinus fluid levels or advanced mucosal thickening. No mastoid or middle ear effusion. Normal orbits. MRA HEAD FINDINGS POSTERIOR CIRCULATION: --Vertebral arteries: Normal --Inferior cerebellar arteries: Normal. --Basilar artery: Normal. --Superior cerebellar arteries: Normal. --Posterior cerebral arteries: Normal. ANTERIOR CIRCULATION: --Intracranial internal  carotid arteries: The left ICA is occluded. Right ICA is normal. There is flow related enhancement at the left ICA terminus due to collateral flow. --Anterior cerebral arteries (ACA): Normal. --Middle cerebral arteries (MCA): Normal. ANATOMIC VARIANTS: None IMPRESSION: 1. Three punctate foci of acute ischemia in the posterior left frontal lobe. No hemorrhage or mass effect. 2. 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 The left middle cerebral artery remains patent. Critical Value/emergent results were called by telephone at the time of interpretation on 05/19/2022 at 757 pm to provider [facility] , who verbally acknowledged these results. Electronically Signed   By: Ulyses Jarred M.D.   On: 05/19/2022 20:09   CT HEAD WO CONTRAST  Result Date: 05/19/2022 CLINICAL DATA:  Provided history: Neuro deficit, acute, stroke suspected. Additional history provided: Right hand weakness. Additional history provided: Patient reports recent "bleed" with subsequent speech difficulty. Patient also reports feeling as though "my hand is not my hand." EXAM: CT HEAD WITHOUT CONTRAST TECHNIQUE: Contiguous axial images were obtained from the base of the skull through the vertex without intravenous contrast. RADIATION DOSE REDUCTION: This exam was performed according to the departmental dose-optimization program which includes automated exposure control, adjustment of the mA and/or kV according to patient size and/or use of iterative reconstruction technique. COMPARISON:  Brain MRI 04/07/2022. Head CT 04/07/2022. FINDINGS: Brain: No age advanced or lobar predominant parenchymal atrophy. Mild chronic small vessel ischemic changes within the cerebral white matter, better appreciated on the prior brain MRI of 04/07/2022. Known small chronic infarcts within both cerebellar hemispheres, most of which were also better appreciated on the prior MRI. There is no acute  intracranial hemorrhage. No demarcated cortical infarct. No extra-axial fluid collection. No evidence of an intracranial mass. No midline shift. Vascular: No hyperdense vessel. Atherosclerotic calcifications. Skull: No fracture or aggressive osseous lesion. Sinuses/Orbits: No mass or acute finding within the imaged orbits. No significant paranasal sinus disease at the imaged levels. IMPRESSION: 1. No evidence of acute intracranial abnormality. 2. Mild chronic small vessel ischemic changes within the cerebral white matter. 3. Known small chronic infarcts within bilateral cerebellar hemispheres. Electronically Signed   By: Kellie Simmering D.O.   On: 05/19/2022 16:11    Assessment and Plan  ARRYANA TOLLESON is a 71 y.o.  woman with PMHx of stroke with residual right sided weakness, anemia, CAD s/ CABG, CHF with EF 30-35%, CKD, DM, hypothyroidism, headache, HTN, HLD, sleep apnea and prior roux-en-Y gastric bypass    presented with right arm weakness which worsened and felt some evidence. She had MRI of the brain which showed acute stroke. Patient overall feels better today. Her weakness/numbness resolved.  Right and weakness acute stroke left posterior frontal lobe/left ICA occlusion -- seen by the neurology --MRI brain and MRA head 05/19/2022 1. Three punctate foci of acute ischemia in the posterior left frontal lobe. No hemorrhage or mass effect. 2. 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 The left middle cerebral artery remains patent. -- Continue aspirin  81 mg daily -- repeat echo -- may need Holter monitor -- patient symptoms resolved -- appreciate PT OT speech eval -- continue statins  Hyperlipidemia -- continue statin and  Hypothyroidism -- on Synthroid  Hypertension -- will resume home meds from tomorrow. Allow permissive hypertension        Procedures:none Family communication :pt does not want me to call any  fmaily Consults :Neurology CODE STATUS: FULL-d/w pt in ER DVT Prophylaxis :lovenox Level of care: Telemetry Medical Status is: Observation The patient remains OBS appropriate and will d/c before 2 midnights.    TOTAL TIME TAKING CARE OF THIS PATIENT: 35 minutes.  >50% time spent on counselling and coordination of care  Note: This dictation was prepared with Dragon dictation along with smaller phrase technology. Any transcriptional errors that result from this process are unintentional.  Fritzi Mandes M.D    [facility]   CC: Primary care physician; Kirk Ruths, MD

## 2022-05-20 NOTE — Assessment & Plan Note (Addendum)
Lab Results  Component Value Date   CREATININE 1.72 (H) 05/19/2022   CREATININE 1.93 (H) 04/09/2022   CREATININE 2.38 (H) 04/07/2022      Latest Ref Rng & Units 05/19/2022    3:54 PM 04/09/2022    5:22 AM 04/07/2022    3:10 PM  CMP  Glucose 70 - 99 mg/dL 186  102  65   BUN 8 - 23 mg/dL 31  37  40   Creatinine 0.44 - 1.00 mg/dL 1.72  1.93  2.38   Sodium 135 - 145 mmol/L 138  143  139   Potassium 3.5 - 5.1 mmol/L 4.2  4.5  4.5   Chloride 98 - 111 mmol/L 108  120  115   CO2 22 - 32 mmol/L 25  19  17    Calcium 8.9 - 10.3 mg/dL 8.2  8.1  8.4   Total Protein 6.5 - 8.1 g/dL 6.9   6.7   Total Bilirubin 0.3 - 1.2 mg/dL 0.4   0.6   Alkaline Phos 38 - 126 U/L 101   93   AST 15 - 41 U/L 15   26   ALT 0 - 44 U/L 9   16    Avoid contrast study.

## 2022-05-20 NOTE — ED Notes (Signed)
PT at bedside.

## 2022-05-20 NOTE — Evaluation (Addendum)
Occupational Therapy Evaluation Patient Details Name: [name] MRN: [id number] DOB: 10-25-1950 Today's Date: 05/20/2022   History of Present Illness [name] is an 71 y.o. female c/o right hand weakness and difficulty lifting. Per report patient had a bleed 10 days ago and since then her speech has been off, and her hand feels like it is not her hand. CT head noncontrast is negative for any bleeds but does show history of scattered cerebellar infarcts.  MRI/A notable for punctate foci of acute ischemic in the left frontal lobe along with complete occlusion of the left ICA.   Clinical Impression   Patient seen for OT evaluation. Prior to admission, pt was Mod I-I for ADLs/IADLs, independent for functional mobility without an AD, and still driving. Pt is currently functioning at Mod I for ADLs, bed mobility, grooming tasks, functional mobility to the bathroom, and toilet transfer with no AD. Pt reports symptoms have resolved. ROM, strength, sensation, vision, and coordination testing all WNL. No neurological deficits noted. At this time, pt does not demonstrate any acute OT needs. Will complete orders.     Recommendations for follow up therapy are one component of a multi-disciplinary discharge planning process, led by the attending physician.  Recommendations may be updated based on patient status, additional functional criteria and insurance authorization.   Follow Up Recommendations  No OT follow up     Assistance Recommended at Discharge PRN  Patient can return home with the following Assist for transportation    Functional Status Assessment  Patient has not had a recent decline in their functional status  Equipment Recommendations  None recommended by OT    Recommendations for Other Services       Precautions / Restrictions Precautions Precautions: None Restrictions Weight Bearing Restrictions: No      Mobility Bed Mobility Overal bed mobility: Independent                   Transfers Overall transfer level: Modified independent Equipment used: Rolling walker (2 wheels), None               General transfer comment: STS from EOB and toilet      Balance Overall balance assessment: Modified Independent                                         ADL either performed or assessed with clinical judgement   ADL Overall ADL's : At baseline;Modified independent                                       General ADL Comments: Pt completed functional mobility to the bathroom (using RW initially then able to complete without), toilet transfer from regular height toilet using GB, peri care, and hand hygiene at the sink with Mod I.     Vision Baseline Vision/History: 1 Wears glasses (reading only) Patient Visual Report: No change from baseline Vision Assessment?: No apparent visual deficits Additional Comments: Denied any blurry vision or diplopia     Perception     Praxis      Pertinent Vitals/Pain Pain Assessment Pain Assessment: No/denies pain     Hand Dominance Right   Extremity/Trunk Assessment Upper Extremity Assessment Upper Extremity Assessment: Overall WFL for tasks assessed   Lower Extremity Assessment Lower Extremity Assessment:  Overall WFL for tasks assessed       Communication Communication Communication: No difficulties   Cognition Arousal/Alertness: Awake/alert Behavior During Therapy: WFL for tasks assessed/performed Overall Cognitive Status: Within Functional Limits for tasks assessed                                       General Comments       Exercises Other Exercises Other Exercises: OT provided education re: role of OT, OT POC, post acute recs, sitting up for all meals, EOB/OOB mobility with assistance, home/fall safety.     Shoulder Instructions      Home Living Family/patient expects to be discharged to:: Private residence Living Arrangements:  Alone Available Help at Discharge: Friend(s);Available PRN/intermittently Type of Home: Apartment Home Access: Stairs to enter Entrance Stairs-Number of Steps: 3 Entrance Stairs-Rails: None Home Layout: Two level;Able to live on main level with bedroom/bathroom Alternate Level Stairs-Number of Steps: flight   Bathroom Shower/Tub: Occupational psychologist: Standard     Home Equipment: Waterproof (2 wheels);Rollator (4 wheels);BSC/3in1          Prior Functioning/Environment Prior Level of Function : Independent/Modified Independent;Driving             Mobility Comments: amb no AD, no recent fall history ADLs Comments: MOD I-I in ADL/IADL, cooks, cleans, drives, mows yard        OT Problem List:        OT Treatment/Interventions:      OT Goals(Current goals can be found in the care plan section) Acute Rehab OT Goals Patient Stated Goal: go home OT Goal Formulation: All assessment and education complete, DC therapy  OT Frequency:      Co-evaluation              AM-PAC OT "6 Clicks" Daily Activity     Outcome Measure Help from another person eating meals?: None Help from another person taking care of personal grooming?: None Help from another person toileting, which includes using toliet, bedpan, or urinal?: None Help from another person bathing (including washing, rinsing, drying)?: None Help from another person to put on and taking off regular upper body clothing?: None Help from another person to put on and taking off regular lower body clothing?: None 6 Click Score: 24   End of Session Equipment Utilized During Treatment: Rolling walker (2 wheels) Nurse Communication: Mobility status  Activity Tolerance: Patient tolerated treatment well Patient left: in bed;with call bell/phone within reach                   Time: 0926-0951 OT Time Calculation (min): 25 min Charges:  OT General Charges $OT Visit: 1 Visit OT Evaluation $OT  Eval Low Complexity: 1 Low  [facility] MS, OTR/L ascom [phone]  05/20/22, 3:37 PM

## 2022-05-20 NOTE — Assessment & Plan Note (Addendum)
Lipid panel Hemoglobin A1c    Antithrombotic Medication : Aspirin 81 mg PO daily  Permissive hypertension, Antihypertensives with prn for first 24-48 hrs post stroke onset. If BP greater than 220/120 give Labetalol IV or Vasotec IV Statins for LDL goal less than 70 Brilinta 90mg  bid starting in AM (due to reported Plavix intolerance)  MRI brain shows  1. Three punctate foci of acute ischemia in the posterior left frontal lobe. No hemorrhage or mass effect. 2. 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 The left middle cerebral artery remains patent.

## 2022-05-20 NOTE — ED Notes (Signed)
Dr. Curly Shores, Neurology at bedside at this time.

## 2022-05-20 NOTE — Plan of Care (Signed)

## 2022-05-20 NOTE — Assessment & Plan Note (Addendum)
No chest pain or sob. Pt has hi risk for vascular disease,due to her risk factor I diabetes/ heat attack and dva,. Aspirin start Brilinta in a.m. patient intolerant to Lipitor.

## 2022-05-20 NOTE — Progress Notes (Signed)
SLP Cancellation Note  Patient Details Name: [name] MRN: [id number] DOB: 01/02/1951   Cancelled treatment:       Reason Eval/Treat Not Completed: SLP screened, no needs identified, will sign off (chart reviewed; consulted NSG then met w/ pt in room.) Pt denied any difficulty swallowing and is currently on a regular diet; tolerates swallowing pills w/ water per NSG. Pt conversed in full conversation w/out overt expressive/receptive deficits noted; pt denied any new speech-language deficits. Speech clear, intelligible. She endorsed intermittent, mild dysfluency of speech since her previous stroke several months ago but she is able to fully/functionally communicate in ADLs w/out any consistent, gross deficits. Pt explained she at times has to "slow down to give myself time to get the word out that I want to say". She was able to recall to this Clinician general speech strategies of slowing down, taking a deep breath, and/or describing what she wants to say. (of note, slight hesitancy was noted x1 during ~10+ min conversation, and it did not impact the conversation; pt agreed). Pt endorsed she feels she is at her Baseline for speech/language.  No further skilled ST services indicated as pt appears at her baseline for swallowing/language. Pt agreed. NSG to reconsult if any change in status while admitted.       Orinda Kenner, MS, CCC-SLP Speech Language Pathologist Rehab Services; [place] [phone] (ascom) Hayley Horn 05/20/2022, 11:48 AM

## 2022-05-20 NOTE — Assessment & Plan Note (Addendum)
Glycemic protocol once pt passed's bedside swallow.  Regimen of tradjenta currently held.

## 2022-05-20 NOTE — Assessment & Plan Note (Signed)
    Latest Ref Rng & Units 05/19/2022    3:54 PM 04/07/2022    3:10 PM 12/17/2021    4:23 PM  CBC  WBC 4.0 - 10.5 K/uL 5.4  7.4  6.6   Hemoglobin 12.0 - 15.0 g/dL 9.2  10.2  8.6   Hematocrit 36.0 - 46.0 % 33.0  35.4  29.6   Platelets 150 - 400 K/uL 327  293  288   Hb is low stable at 9.2

## 2022-05-20 NOTE — Assessment & Plan Note (Signed)
Vitals:   05/19/22 1545 05/19/22 1548 05/19/22 1807 05/19/22 1900  BP: 138/79 138/79 139/77 (!) 146/68   05/19/22 2030 05/19/22 2100 05/19/22 2130 05/19/22 2200  BP: (!) 158/85 (!) 149/68 (!) 151/64 (!) 149/64   05/19/22 2230 05/19/22 2311 05/19/22 2330  BP: (!) 191/79 (!) 185/71 (!) 169/72   Hold diuretic and bp meds.  Vasotec for SBP 220 and above.

## 2022-05-20 NOTE — Consult Note (Signed)
Neurology Consultation Reason for Consult: Stroke on MRI, RUE weakness  Requesting Physician: Fritzi Mandes   CC: right hand weakness   History is obtained from: Patient and chart review   HPI: [name] is a 71 y.o. female with a PMHx of stroke with residual right sided weakness, anemia, CAD s/ CABG, CHF with EF 30-35%, CKD, DM, hypothyroidism, headache, HTN, HLD, sleep apnea and prior roux-en-Y gastric bypass    She has had fluctuating RUE weakness since 12/11 morning, initially not wanting to come in due to not wanting symptoms "to be real." Currently back to her baseline. Seen by teleneuro overnight, NIH 2 for sensory change and RLE drift (now resolved) and started on ticagrelor due to plavix intolerance   She was previously on Eliquis for an acute thrombotic event to an artery in her left leg but discontinued this reportedly due to low blood pressure and feeling badly on Eliquis (couldn't walk, improved after stopping Eliquis, similar symptoms with Plavix).     She has had an event monitor in the past with Dr. Nehemiah Massed in March 2021 which was negative for atrial fibrillation   She recently presented in July with some left hand numbness on awakening with MRI showing a small abnormality in the right frontal lobe concerning for stroke versus artifact.  Full stroke workup was completed as below   LKW: 12/11 morning Thrombolytic given?: No, out of the window IA performed?: No, NIH too low  Premorbid modified rankin scale:      1 - No significant disability. Able to carry out all usual activities, despite some symptoms.  Subtle word finding difficulties and RUE weakness when fatigued since first stroke    ROS:  UTI 1 week ago improved with abx, BP meds changed in Oct, notes improved sleep not needing tylenol PM / benadryl at night anymore but BPs now running 150s-160s/80s-90s (prev 110s/70s)  Not using CPAP as did not tolerate full face mask, interested in new masks  otherwise  negative.   Past Medical History:  Diagnosis Date   Acute CVA (cerebrovascular accident) ([place] ) 12/18/2021   Acute gastric ulcer without hemorrhage or perforation    Acute on chronic systolic CHF (congestive heart failure), NYHA class 3 ([place]) 08/03/2019   Formatting of this note might be different from the original. Global ef 35%   Anemia    Anemia, iron deficiency 11/07/2013   Arthritis    CAD (coronary artery disease)    Cerebrovascular accident [facility])    CHF (congestive heart failure) ([place])    Chicken pox    Chronic kidney disease    Colitis    ? 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   Sleep apnea    Type 2 diabetes mellitus with diabetic peripheral angiopathy without gangrene, without long-term current use of insulin ([place])    Past Surgical History:  Procedure Laterality Date   ABDOMINAL HYSTERECTOMY     CHOLECYSTECTOMY  COLONOSCOPY WITH PROPOFOL N/A 12/24/2014   Procedure: COLONOSCOPY WITH PROPOFOL;  Surgeon: Hulen Luster, MD;  Location: [facility] ENDOSCOPY;  Service: Gastroenterology;  Laterality: N/A;   CORONARY ARTERY BYPASS GRAFT     ESOPHAGOGASTRODUODENOSCOPY N/A 09/02/2017   Procedure: ESOPHAGOGASTRODUODENOSCOPY (EGD);  Surgeon: Virgel Manifold, MD;  Location: [facility] ENDOSCOPY;  Service: Endoscopy;  Laterality: N/A;   ESOPHAGOGASTRODUODENOSCOPY (EGD) WITH PROPOFOL N/A 12/24/2014   Procedure: ESOPHAGOGASTRODUODENOSCOPY (EGD) WITH PROPOFOL;  Surgeon: Hulen Luster, MD;  Location: [facility] ENDOSCOPY;   Service: Gastroenterology;  Laterality: N/A;   JOINT REPLACEMENT     LOWER EXTREMITY ANGIOGRAPHY Left 07/17/2019   Procedure: Lower Extremity Angiography;  Surgeon: Algernon Huxley, MD;  Location: [place] CV LAB;  Service: Cardiovascular;  Laterality: Left;   ROUX-EN-Y GASTRIC BYPASS     loss over 100 pounds   TONSILLECTOMY     TOTAL KNEE ARTHROPLASTY Left    Current Outpatient Medications  Medication Instructions   aspirin EC 81 mg, Oral, Daily   blood glucose meter kit and supplies KIT Dispense based on patient and insurance preference. Use up to four times daily as directed.   cloNIDine (CATAPRES) 0.1 mg, Oral, 2 times daily   diphenhydrAMINE (BENADRYL) 50 mg, Oral, At bedtime PRN   furosemide (LASIX) 40 mg, Oral, Daily   levothyroxine (SYNTHROID) 200 mcg, Oral, As directed, Take 1 tablet (200 mcg total) by mouth every Monday, Wednesday, and Friday Take on an empty stomach with a glass of water at least 30-60 minutes before breakfast.   linagliptin (TRADJENTA) 5 mg, Oral, Daily   losartan (COZAAR) 25 mg, Oral, Daily   lovastatin (MEVACOR) 40 mg, Oral, Daily   Multiple Vitamin (MULTIVITAMIN WITH MINERALS) TABS tablet 1 tablet, Oral, Daily   pantoprazole (PROTONIX) 20 mg, Oral, Daily   potassium chloride (KLOR-CON) 10 MEQ tablet 20 mEq, Oral, Daily     Family History  Problem Relation Age of Onset   Coronary artery disease Other    Stroke Maternal Grandmother    Diabetes Paternal Grandmother        diabetic coma   Heart attack Father    Heart attack Mother    Heart attack Maternal Grandfather    Heart attack Paternal Grandfather    Breast cancer Neg Hx     Social History:  reports that she has never smoked. She has never used smokeless tobacco. She reports that she does not drink alcohol and does not use drugs.   Exam: Current vital signs: BP (!) 173/86   Pulse 69   Temp 98 F (36.7 C) (Oral)   Resp 16   Ht _0  (1.676 m)   Wt 85.3 kg   SpO2 100%   BMI 30.34  kg/m  Vital signs in last 24 hours: Temp:  [97.8 F (36.6 C)-98.4 F (36.9 C)] 98 F (36.7 C) (12/13 1158) Pulse Rate:  [56-85] 69 (12/13 1030) Resp:  [9-19] 16 (12/13 1030) BP: (132-193)/(52-92) 173/86 (12/13 1030) SpO2:  [95 %-100 %] 100 % (12/13 1030) Weight:  [85.3 kg] 85.3 kg (12/12 1541)   Physical Exam  Constitutional: Appears well-developed and well-nourished.  Psych: Affect appropriate to situation, clam and cooperative  Eyes: No scleral injection HENT: No oropharyngeal obstruction.  MSK: no major joint deformities.  Cardiovascular: Normal rate and regular rhythm. Perfusing extremities well Respiratory: Effort normal, non-labored breathing GI: Soft.  No distension. There is no tenderness.  Skin: Warm dry and intact visible skin  Neuro: Mental Status: Patient is awake, alert, oriented  to person, place, month, year, and situation. Patient is able to give a clear and coherent history. No signs of aphasia or neglect other than baseline very mild speech dysfluency Cranial Nerves: II: Visual Fields are full. Pupils are equal, round, and reactive to light.   III,IV, VI: EOMI without ptosis or diploplia. Saccadic pursuits  V: Facial sensation is symmetric to temperature VII: Facial movement is symmetric.  VIII: hearing is intact to voice X: Uvula elevates symmetrically XI: Shoulder shrug is symmetric. XII: tongue is midline without atrophy or fasciculations.  Motor: Tone is normal. Bulk is normal. 5/5 strength was present in all four extremities, other than hip flexion 4/5 bilaterally  Sensory: Sensation is symmetric to light touch and temperature in the arms and legs. Deep Tendon Reflexes: 2+ and symmetric in the brachioradialis and patellae, 1+ brachioradialis  Plantars: Mute toes bilaterally  Cerebellar: FNF and HKS are intact bilaterally Gait:  Deferred   NIHSS total 0   2 for telespecialists (1 for RLE weakness and 1 for sensory loss)   I have reviewed  labs in epic and the results pertinent to this consultation are:  Basic Metabolic Panel: Recent Labs  Lab 05/19/22 1554  NA 138  K 4.2  CL 108  CO2 25  GLUCOSE 186*  BUN 31*  CREATININE 1.72*  CALCIUM 8.2*    CBC: Recent Labs  Lab 05/19/22 1554  WBC 5.4  NEUTROABS 3.4  HGB 9.2*  HCT 33.0*  MCV 83.5  PLT 327    Coagulation Studies: Recent Labs    05/19/22 1554  LABPROT 14.3  INR 1.1    Lab Results  Component Value Date   HGBA1C 6.4 (H) 12/19/2021   Lab Results  Component Value Date   CHOL 140 05/20/2022   HDL 46 05/20/2022   LDLCALC 75 05/20/2022   TRIG 97 05/20/2022   CHOLHDL 3.0 05/20/2022     I have reviewed the images obtained:  MRI brain and MRA head 05/19/2022 1. Three punctate foci of acute ischemia in the posterior left frontal lobe. No hemorrhage or mass effect. 2. 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 The left middle cerebral artery remains patent.  Cartoid duplex 7/14 1. Mild (1-49%) stenosis proximal right internal carotid artery secondary to heterogenous atherosclerotic plaque. 2. Mild (1-49%) stenosis proximal left internal carotid artery secondary to heterogenous atherosclerotic plaque. 3. Vertebral arteries are patent with normal antegrade flow.   MRA head w/o 7/14 No large vessel occlusion. Mild multifocal atherosclerotic irregularity. Focal marked stenosis of left A2 ACA.   MRI brain w/o 7/13 1. Subtle 1.8 cm linear focus of diffusion signal abnormality involving the cortical/subcortical right frontal lobe as above. While this finding could potentially be artifactual in nature, a possible acute ischemic infarct is difficult to exclude, particularly given the provided history. Correlation with physical exam and any potential symptomatology recommended. No associated hemorrhage or mass effect. 2. No other acute intracranial abnormality. 3. Age-related  cerebral atrophy with mild chronic small vessel ischemic disease. Few small remote cerebellar infarcts.   ECHO: 1. Left ventricular ejection fraction, by estimation, is 30 to 35%. The  left ventricle has moderately decreased function. The left ventricle  demonstrates global hypokinesis. There is moderate asymmetric left  ventricular hypertrophy of the basal-septal  segment. Left ventricular diastolic parameters are consistent with Grade I  diastolic dysfunction (impaired relaxation).   2. Right ventricular systolic function is normal. The right ventricular  size is normal. Tricuspid regurgitation signal is inadequate for assessing  PA pressure.   3. Left atrial size was moderately dilated.   4. The mitral valve is normal in structure. Mild mitral valve  regurgitation. No evidence of mitral stenosis.   5. The aortic valve has an indeterminant number of cusps. Aortic valve  regurgitation is not visualized. No aortic stenosis is present.   6. There is borderline dilatation of the ascending aorta, measuring 37  mm.   7. The inferior vena cava is normal in size with greater than 50%  respiratory variability, suggesting right atrial pressure of 3 mmHg.   Impression: Embolic appearing stroke w/ L ICA likely subocclusive thrombus given exam not c/w LVO and no occlusion on July 2023 scans. High concern for cardioembolic etiology. No role for intervention at this time in patient who is at basline  Recommendations: - Agree with ticagrelor, course TBD pending carotid US imaging  - Continue ASA 81 mg daily  - TSH  - Will repeat ECHO despite no cardiac symptoms given new event  - May need repeat holter vs. Loop recorder outpatient  - May start to gradually normalize BP tomorrow given out of permissive hypertension window at that point - Patient prefers to try diet and exercise rather than change statin at this time, continue lovastatin 40 mg for now  - Neurology will follow along  Lesleigh Noe  MD-PhD [facility] [phone] Available 7 AM to 7 PM, outside these hours please contact Neurologist on call listed on AMION

## 2022-05-20 NOTE — Assessment & Plan Note (Addendum)
Abnormal TFTs a month ago. Will repeat until thyroid function test generally with thyroxine at current dose of 200 mcg daily.

## 2022-05-21 DIAGNOSIS — R29898 Other symptoms and signs involving the musculoskeletal system: Secondary | ICD-10-CM | POA: Diagnosis not present

## 2022-05-21 LAB — GLUCOSE, CAPILLARY
Glucose-Capillary: 115 mg/dL — ABNORMAL HIGH (ref 70–99)
Glucose-Capillary: 183 mg/dL — ABNORMAL HIGH (ref 70–99)
Glucose-Capillary: 183 mg/dL — ABNORMAL HIGH (ref 70–99)

## 2022-05-21 LAB — T4, FREE: Free T4: 0.8 ng/dL (ref 0.61–1.12)

## 2022-05-21 MED ORDER — TICAGRELOR 90 MG PO TABS: 90.0000 mg | ORAL_TABLET | Freq: Two times a day (BID) | ORAL | 3 refills | Status: DC

## 2022-05-21 MED ORDER — ASPIRIN EC 81 MG PO TBEC: 81.0000 mg | DELAYED_RELEASE_TABLET | Freq: Every day | ORAL | 0 refills | Status: DC

## 2022-05-21 MED ORDER — ATORVASTATIN CALCIUM 40 MG PO TABS: 40.0000 mg | ORAL_TABLET | Freq: Every day | ORAL | 1 refills | Status: DC

## 2022-05-21 MED ORDER — ATORVASTATIN CALCIUM 20 MG PO TABS: 40.0000 mg | ORAL_TABLET | Freq: Every day | ORAL | Status: DC

## 2022-05-21 NOTE — Progress Notes (Signed)
Mobility Specialist - Progress Note   05/21/22 0937  Mobility  Activity Ambulated independently in hallway;Stood at bedside;Dangled on edge of bed  Level of Assistance Independent  Assistive Device None  Distance Ambulated (ft) 160 ft  Activity Response Tolerated well  Mobility Referral Yes  $Mobility charge 1 Mobility   Pt EOB on RA upon arrival. Pt STS and ambulates 1 lap around NS indep. Pt returns to EOB with needs in reach.   Gretchen Short  Mobility Specialist  05/21/22 9:38 AM

## 2022-05-21 NOTE — Discharge Summary (Addendum)
Physician Discharge Summary   Patient: [name] MRN: [id number] DOB: [birth year]  Admit date:     05/19/2022  Discharge date: 05/21/22  Discharge Physician: Fritzi Mandes   PCP: Kirk Ruths, MD   Recommendations at discharge:    F/u Dr Clayborn Bigness in 1-2 weeks CVA and 30 day event monitor F/u Dr Ouida Sills in 1 week  Discharge Diagnoses: Acute CVA Left posterior Frontal lobe Left ICA non critical stenosis--will need f/u MRA head/neck in 2-3 months  Hospital Course: [name] is a 71 y.o.  woman with PMHx of stroke with residual right sided weakness, anemia, CAD s/ CABG, CHF with EF 30-35%, CKD, DM, hypothyroidism, headache, HTN, HLD, sleep apnea and prior roux-en-Y gastric bypass   pt presented with right arm weakness which worsened and felt some evidence. She had MRI of the brain which showed acute stroke. Patient overall feels better today. Her weakness/numbness resolved.   Right hand weakness Acute stroke left posterior frontal lobe/left ICA occlusion -- seen by the neurology --MRI brain and MRA head 05/19/2022 1. Three punctate foci of acute ischemia in the posterior left frontal lobe. No hemorrhage or mass effect. 2. 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 The left middle cerebral artery remains patent. -- Continue aspirin 81 mg +Brilinta for 90 days and then drop ASA and cont brinlinta (neurology recs) -- repeat echo noted -- 30 day Holter monitor prior to discharge--will be placed by [facility] cardiology--pt to f/u dr Clayborn Bigness -- patient symptoms resolved -- appreciate PT OT speech eval--no PT needs -- continue statins   Hyperlipidemia -- continue statin and   Hypothyroidism -- on Synthroid   Hypertension -- resume BP meds  H/o HCM --pt to f/u Dr Clayborn Bigness  D/c home       Consultants: Neurology Disposition: Home Diet recommendation:  Discharge Diet Orders (From admission, onward)      Start     Ordered   05/21/22 0000  Diet - low sodium heart healthy        05/21/22 1248           Cardiac diet DISCHARGE MEDICATION: Allergies as of 05/21/2022       Reactions   Niacin Other (See Comments)   Heart Attack Other reaction(s): Other (see comments) Heart Attack   Tramadol    Other reaction(s): Other (see comments) Intolerance, makes woozy   Ultram [tramadol Hcl]    Intolerance, makes woozy   Baclofen Other (See Comments)   Dizziness Other reaction(s): Other (see comments) Dizziness   Cardura [doxazosin Mesylate] Other (See Comments)   Dizzy with passing out spells   Doxazosin    Other reaction(s): Other (see comments) Dizzy with passing out spells Dizzy with passing out spells   Morphine And Related Nausea And Vomiting, Nausea Only   Other Rash   Gold metal (nickel in the gold) Gold metal Gold metal (nickel in the gold)        Medication List     STOP taking these medications    linagliptin 5 MG Tabs tablet Commonly known as: TRADJENTA   lovastatin 40 MG tablet Commonly known as: MEVACOR   potassium chloride 10 MEQ tablet Commonly known as: KLOR-CON       TAKE these medications    aspirin EC 81 MG tablet Take 1 tablet (81 mg total) by mouth daily.   atorvastatin 40 MG tablet Commonly known as: LIPITOR Take 1 tablet (40 mg  total) by mouth at bedtime.   blood glucose meter kit and supplies Kit Dispense based on patient and insurance preference. Use up to four times daily as directed.   cloNIDine 0.1 MG tablet Commonly known as: CATAPRES Take 0.1 mg by mouth 2 (two) times daily.   diphenhydrAMINE 50 MG capsule Commonly known as: BENADRYL Take 50 mg by mouth at bedtime as needed for sleep (Takes Benadryl 50 mg with 1 tab Tylenol PM qHS as needed).   furosemide 40 MG tablet Commonly known as: LASIX Take 40 mg by mouth daily.   levothyroxine 200 MCG tablet Commonly known as: SYNTHROID Take 200 mcg by mouth as directed. Take 1  tablet (200 mcg total) by mouth every Monday, Wednesday, and Friday Take on an empty stomach with a glass of water at least 30-60 minutes before breakfast.   losartan 25 MG tablet Commonly known as: COZAAR Take 25 mg by mouth daily.   multivitamin with minerals Tabs tablet Take 1 tablet by mouth daily.   pantoprazole 20 MG tablet Commonly known as: PROTONIX Take 20 mg by mouth daily.   ticagrelor 90 MG Tabs tablet Commonly known as: BRILINTA Take 1 tablet (90 mg total) by mouth 2 (two) times daily.        Follow-up Information     Yolonda Kida, MD. Go in 1 week(s).   Specialties: Cardiology, Internal Medicine Why: admission with CVA, follow up echo & 30 day monitor Contact information: [place] [address] [phone]         Kirk Ruths, MD. Schedule an appointment as soon as possible for a visit in 1 week(s).   Specialty: Internal Medicine Why: cva f/u Contact information: [address] [facility] [place] [address] [phone]                 Danley Danker Weights   05/19/22 1541  Weight: 85.3 kg     Condition at discharge: fair  The results of significant diagnostics from this hospitalization (including imaging, microbiology, ancillary and laboratory) are listed below for reference.   Imaging Studies: US Carotid Bilateral  Result Date: 05/20/2022 CLINICAL DATA:  71 year old female with history of stroke. EXAM: BILATERAL CAROTID DUPLEX ULTRASOUND TECHNIQUE: Pearline Cables scale imaging, color Doppler and duplex ultrasound were performed of bilateral carotid and vertebral arteries in the neck. COMPARISON:  None Available. FINDINGS: Criteria: Quantification of carotid stenosis is based on velocity parameters that correlate the residual internal carotid diameter with NASCET-based stenosis levels, using the diameter of the distal internal carotid lumen as the denominator for stenosis measurement. The  following velocity measurements were obtained: RIGHT ICA: Peak systolic velocity 229 cm/sec, End diastolic velocity 48 cm/sec CCA: Peak systolic velocity 51 cm/sec SYSTOLIC ICA/CCA RATIO:  2.0 ECA: Peak systolic velocity 798 cm/sec LEFT ICA: Peak systolic velocity 49 cm/sec, End diastolic velocity 7 cm/sec CCA: 38 cm/sec SYSTOLIC ICA/CCA RATIO:  1.3 ECA: 117 cm/sec RIGHT CAROTID ARTERY: Moderate multifocal atherosclerotic plaque formation, most prominent about the carotid bifurcation proximal ICA. The ICA is tortuous. Normal low resistance waveforms. RIGHT VERTEBRAL ARTERY:  Antegrade flow. LEFT CAROTID ARTERY: Moderate multifocal atherosclerotic plaque formation, most prominent the carotid bulb and bifurcation. The ICA is tortuous. High resistance waveforms. LEFT VERTEBRAL ARTERY:  Antegrade flow. Upper extremity non-invasive blood pressures: Not obtained. IMPRESSION: 1. Right carotid artery system: Less than 50% stenosis secondary to moderate multifocal atherosclerotic plaque formation. 2. Left carotid artery system: Less than 50% stenosis secondary to  moderate atherosclerotic plaque formation. High resistance wave throughout the left carotid system suggestive of intracranial vascular abnormality such as distal ICA occlusion. 3.  Vertebral artery system: Patent with antegrade flow bilaterally. Ruthann Cancer, MD [facility]iology Electronically Signed   By: Ruthann Cancer M.D.   On: 05/20/2022 16:04   ECHOCARDIOGRAM COMPLETE BUBBLE STUDY  Result Date: 05/20/2022    ECHOCARDIOGRAM REPORT   Patient Name:   ARLYNE BRANDES Ziska Date of Exam: 05/20/2022 Medical Rec #:  [id number]      Height:       66.0 in Accession #:    [id number]     Weight:       188.0 lb Date of Birth:  1950/12/05      BSA:          1.948 m Patient Age:    7 years       BP:           153/73 mmHg Patient Gender: F              HR:           67 bpm. Exam Location:  [facility] Procedure: 2D Echo, Color  Doppler, Cardiac Doppler and Saline Contrast Bubble            Study Indications:     I63.9 Stroke  History:         Patient has prior history of Echocardiogram examinations, most                  recent 12/19/2021. CHF, CAD, Stroke and PAD; Risk                  Factors:Diabetes, Hypertension, Dyslipidemia and Sleep Apnea.  Sonographer:     Charmayne Sheer Referring Phys:  [id number] Gretta Cool Ila Landowski Diagnosing Phys: Kate Sable MD  Sonographer Comments: No subcostal window and suboptimal apical window. IMPRESSIONS  1. Severe asymmetric LVH. no LVOT obstruction. consider CMR to evaluate HCM. Left ventricular ejection fraction, by estimation, is 35 to 40%. The left ventricle has moderately decreased function. The left ventricle demonstrates regional wall motion abnormalities (see scoring diagram/findings for description). There is severe asymmetric left ventricular hypertrophy of the septal segment. Left ventricular diastolic parameters are indeterminate. There is severe hypokinesis of the left ventricular, entire inferolateral wall.  2. Right ventricular systolic function is normal. The right ventricular size is not well visualized.  3. The mitral valve is normal in structure. Mild mitral valve regurgitation.  4. The aortic valve was not well visualized. Aortic valve regurgitation is not visualized. FINDINGS  Left Ventricle: Severe asymmetric LVH. no LVOT obstruction. consider CMR to evaluate HCM. Left ventricular ejection fraction, by estimation, is 35 to 40%. The left ventricle has moderately decreased function. The left ventricle demonstrates regional wall motion abnormalities. Severe hypokinesis of the left ventricular, entire inferolateral wall. The left ventricular internal cavity size was normal in size. There is severe asymmetric left ventricular hypertrophy of the septal segment. Left ventricular diastolic parameters are indeterminate. Right Ventricle: The right ventricular size is not well visualized. No increase  in right ventricular wall thickness. Right ventricular systolic function is normal. Left Atrium: Left atrial size was normal in size. Right Atrium: Right atrial size was normal in size. Pericardium: There is no evidence of pericardial effusion. Mitral Valve: The mitral valve is normal in structure. Mild mitral valve regurgitation. Tricuspid Valve: The tricuspid valve is not well visualized. Tricuspid valve regurgitation is not demonstrated.  Aortic Valve: The aortic valve was not well visualized. Aortic valve regurgitation is not visualized. Aortic valve mean gradient measures 4.0 mmHg. Aortic valve peak gradient measures 5.7 mmHg. Aortic valve area, by VTI measures 3.00 cm. Pulmonic Valve: The pulmonic valve was not well visualized. Pulmonic valve regurgitation is not visualized. Aorta: The aortic root and ascending aorta are structurally normal, with no evidence of dilitation. Venous: The inferior vena cava was not well visualized. IAS/Shunts: No atrial level shunt detected by color flow Doppler. Agitated saline contrast was given intravenously to evaluate for intracardiac shunting.  LEFT VENTRICLE PLAX 2D LVIDd:         4.40 cm   Diastology LVIDs:         3.60 cm   LV e' medial:    3.15 cm/s LV PW:         1.30 cm   LV E/e' medial:  20.0 LV IVS:        1.70 cm   LV e' lateral:   3.05 cm/s LVOT diam:     2.20 cm   LV E/e' lateral: 20.6 LV SV:         67 LV SV Index:   34 LVOT Area:     3.80 cm  LEFT ATRIUM             Index        RIGHT ATRIUM           Index LA diam:        5.90 cm 3.03 cm/m   RA Area:     12.90 cm LA Vol (A2C):   36.9 ml 18.94 ml/m  RA Volume:   28.80 ml  14.79 ml/m LA Vol (A4C):   51.8 ml 26.59 ml/m LA Biplane Vol: 43.8 ml 22.49 ml/m  AORTIC VALVE                    PULMONIC VALVE AV Area (Vmax):    3.19 cm     PV Vmax:       1.61 m/s AV Area (Vmean):   2.55 cm     PV Vmean:      105.000 cm/s AV Area (VTI):     3.00 cm     PV VTI:        0.295 m AV Vmax:           119.00 cm/s  PV Peak  grad:  10.4 mmHg AV Vmean:          88.400 cm/s  PV Mean grad:  5.0 mmHg AV VTI:            0.222 m AV Peak Grad:      5.7 mmHg AV Mean Grad:      4.0 mmHg LVOT Vmax:         99.80 cm/s LVOT Vmean:        59.400 cm/s LVOT VTI:          0.175 m LVOT/AV VTI ratio: 0.79  AORTA Ao Root diam: 3.10 cm MITRAL VALVE MV Area (PHT): 6.37 cm     SHUNTS MV Decel Time: 119 msec     Systemic VTI:  0.18 m MV E velocity: 62.90 cm/s   Systemic Diam: 2.20 cm MV A velocity: 112.00 cm/s MV E/A ratio:  0.56 Kate Sable MD Electronically signed by Kate Sable MD Signature Date/Time: 05/20/2022/3:29:51 PM    Final    MR BRAIN WO CONTRAST  Result Date: 05/19/2022 CLINICAL DATA:  Right hand weakness EXAM: MRI HEAD WITHOUT CONTRAST MRA HEAD WITHOUT CONTRAST TECHNIQUE: Multiplanar, multi-echo pulse sequences of the brain and surrounding structures were acquired without intravenous contrast. Angiographic images of the Circle of Willis were acquired using MRA technique without intravenous contrast. COMPARISON:  None Available. FINDINGS: MRI HEAD FINDINGS Brain: There are 3 punctate foci of acute ischemia in the posterior left frontal lobe. No acute or chronic hemorrhage. There is multifocal hyperintense T2-weighted signal within the white matter. Parenchymal volume and CSF spaces are normal. The midline structures are normal. Vascular: Loss of the normal left ICA flow void, new since 04/07/2022 Skull and upper cervical spine: Normal calvarium and skull base. Visualized upper cervical spine and soft tissues are normal. Sinuses/Orbits:No paranasal sinus fluid levels or advanced mucosal thickening. No mastoid or middle ear effusion. Normal orbits. MRA HEAD FINDINGS POSTERIOR CIRCULATION: --Vertebral arteries: Normal --Inferior cerebellar arteries: Normal. --Basilar artery: Normal. --Superior cerebellar arteries: Normal. --Posterior cerebral arteries: Normal. ANTERIOR CIRCULATION: --Intracranial internal carotid arteries: The left  ICA is occluded. Right ICA is normal. There is flow related enhancement at the left ICA terminus due to collateral flow. --Anterior cerebral arteries (ACA): Normal. --Middle cerebral arteries (MCA): Normal. ANATOMIC VARIANTS: None IMPRESSION: 1. Three punctate foci of acute ischemia in the posterior left frontal lobe. No hemorrhage or mass effect. 2. 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 The left middle cerebral artery remains patent. Critical Value/emergent results were called by telephone at the time of interpretation on 05/19/2022 at 757 pm to provider [facility] , who verbally acknowledged these results. Electronically Signed   By: Ulyses Jarred M.D.   On: 05/19/2022 20:09   MR ANGIO HEAD WO CONTRAST  Result Date: 05/19/2022 CLINICAL DATA:  Right hand weakness EXAM: MRI HEAD WITHOUT CONTRAST MRA HEAD WITHOUT CONTRAST TECHNIQUE: Multiplanar, multi-echo pulse sequences of the brain and surrounding structures were acquired without intravenous contrast. Angiographic images of the Circle of Willis were acquired using MRA technique without intravenous contrast. COMPARISON:  None Available. FINDINGS: MRI HEAD FINDINGS Brain: There are 3 punctate foci of acute ischemia in the posterior left frontal lobe. No acute or chronic hemorrhage. There is multifocal hyperintense T2-weighted signal within the white matter. Parenchymal volume and CSF spaces are normal. The midline structures are normal. Vascular: Loss of the normal left ICA flow void, new since 04/07/2022 Skull and upper cervical spine: Normal calvarium and skull base. Visualized upper cervical spine and soft tissues are normal. Sinuses/Orbits:No paranasal sinus fluid levels or advanced mucosal thickening. No mastoid or middle ear effusion. Normal orbits. MRA HEAD FINDINGS POSTERIOR CIRCULATION: --Vertebral arteries: Normal --Inferior cerebellar arteries: Normal. --Basilar artery: Normal.  --Superior cerebellar arteries: Normal. --Posterior cerebral arteries: Normal. 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 Right ICA is normal. There is flow related enhancement at the left ICA terminus due to collateral flow. --Anterior cerebral arteries (ACA): Normal. --Middle cerebral arteries (MCA): Normal. ANATOMIC VARIANTS: None IMPRESSION: 1. Three punctate foci of acute ischemia in the posterior left frontal lobe. No hemorrhage or mass effect. 2. 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 The left middle cerebral artery remains patent. Critical Value/emergent results were called by telephone at the time of interpretation on 05/19/2022 at 757 pm to provider [facility] , who verbally acknowledged these results. Electronically Signed   By: Ulyses Jarred M.D.   On: 05/19/2022 20:09  CT HEAD WO CONTRAST  Result Date: 05/19/2022 CLINICAL DATA:  Provided history: Neuro deficit, acute, stroke suspected. Additional history provided: Right hand weakness. Additional history provided: Patient reports recent "bleed" with subsequent speech difficulty. Patient also reports feeling as though "my hand is not my hand." EXAM: CT HEAD WITHOUT CONTRAST TECHNIQUE: Contiguous axial images were obtained from the base of the skull through the vertex without intravenous contrast. RADIATION DOSE REDUCTION: This exam was performed according to the departmental dose-optimization program which includes automated exposure control, adjustment of the mA and/or kV according to patient size and/or use of iterative reconstruction technique. COMPARISON:  Brain MRI 04/07/2022. Head CT 04/07/2022. FINDINGS: Brain: No age advanced or lobar predominant parenchymal atrophy. Mild chronic small vessel ischemic changes within the cerebral white matter, better appreciated on the prior brain MRI of 04/07/2022. Known small chronic infarcts  within both cerebellar hemispheres, most of which were also better appreciated on the prior MRI. There is no acute intracranial hemorrhage. No demarcated cortical infarct. No extra-axial fluid collection. No evidence of an intracranial mass. No midline shift. Vascular: No hyperdense vessel. Atherosclerotic calcifications. Skull: No fracture or aggressive osseous lesion. Sinuses/Orbits: No mass or acute finding within the imaged orbits. No significant paranasal sinus disease at the imaged levels. IMPRESSION: 1. No evidence of acute intracranial abnormality. 2. Mild chronic small vessel ischemic changes within the cerebral white matter. 3. Known small chronic infarcts within bilateral cerebellar hemispheres. Electronically Signed   By: Kellie Simmering D.O.   On: 05/19/2022 16:11    Microbiology: Results for orders placed or performed during the hospital encounter of 03/27/21  Urine Culture     Status: Abnormal   Collection Time: 03/27/21  5:44 PM   Specimen: Urine, Clean Catch  Result Value Ref Range Status   Specimen Description URINE, CLEAN CATCH  Final   Special Requests   Final    NONE Performed at [facility], [place] [address] [place], [place] [address]    Culture >=100,000 COLONIES/mL ESCHERICHIA COLI (A)  Final   Report Status 03/29/2021 FINAL  Final   Organism ID, Bacteria ESCHERICHIA COLI (A)  Final      Susceptibility   Escherichia coli - MIC*    AMPICILLIN 4 SENSITIVE Sensitive     CEFAZOLIN <=4 SENSITIVE Sensitive     CEFEPIME <=0.12 SENSITIVE Sensitive     CEFTRIAXONE <=0.25 SENSITIVE Sensitive     CIPROFLOXACIN 0.5 INTERMEDIATE Intermediate     GENTAMICIN <=1 SENSITIVE Sensitive     IMIPENEM <=0.25 SENSITIVE Sensitive     NITROFURANTOIN <=16 SENSITIVE Sensitive     TRIMETH/SULFA <=20 SENSITIVE Sensitive     AMPICILLIN/SULBACTAM <=2 SENSITIVE Sensitive     PIP/TAZO <=4 SENSITIVE Sensitive     * >=100,000 COLONIES/mL ESCHERICHIA COLI    Labs: CBC: Recent Labs  Lab  05/19/22 1554  WBC 5.4  NEUTROABS 3.4  HGB 9.2*  HCT 33.0*  MCV 83.5  PLT 008   Basic Metabolic Panel: Recent Labs  Lab 05/19/22 1554  NA 138  K 4.2  CL 108  CO2 25  GLUCOSE 186*  BUN 31*  CREATININE 1.72*  CALCIUM 8.2*   Liver Function Tests: Recent Labs  Lab 05/19/22 1554  AST 15  ALT 9  ALKPHOS 101  BILITOT 0.4  PROT 6.9  ALBUMIN 3.4*   CBG: Recent Labs  Lab 05/20/22 0741 05/20/22 1157 05/20/22 1612 05/20/22 2058 05/21/22 0827  GLUCAP 108* 152* 119* 143* 115*    Discharge time spent: greater  than 30 minutes.  Signed: Fritzi Mandes, MD [facility] 05/21/2022

## 2022-05-21 NOTE — Discharge Instructions (Addendum)
Pt advised to d/w PCP regarding out pt Sleep study Pt to get MRA head/neck in 2-3 months --defer to PCP

## 2022-05-21 NOTE — TOC CM/SW Note (Signed)
  Transition of Care (TOC) Screening Note   Patient Details  Name: [name] Date of Birth: Jul 29, 1950   Transition of Care [facility]) CM/SW Contact:    Colen Darling, [place] Phone Number: 05/21/2022, 1:34 PM    Transition of Care Department [facility]) has reviewed patient and no TOC needs have been identified at this time. We will continue to monitor patient advancement through interdisciplinary progression rounds. If new patient transition needs arise, please place a TOC consult.

## 2022-05-21 NOTE — Plan of Care (Deleted)
S: No further right sided weakness / numbness or language issues Patient remains stable at her baseline  No other acute complaints Willing to try higher intensity statin, reports poor diet in the setting of social stressors x 1 year  O: Current vital signs: BP (!) 162/84 (BP Location: Left Arm)   Pulse 62   Temp 97.8 F (36.6 C)   Resp 17   Ht 5\' 6"  (1.676 m)   Wt 85.3 kg   SpO2 98%   BMI 30.34 kg/m  Vital signs in last 24 hours: Temp:  [97.5 F (36.4 C)-99 F (37.2 C)] 97.8 F (36.6 C) (12/14 0826) Pulse Rate:  [58-67] 62 (12/14 0826) Resp:  [14-21] 17 (12/14 0826) BP: (155-178)/(69-97) 162/84 (12/14 0826) SpO2:  [98 %-100 %] 98 % (12/14 0826)  Exam:  Comfortable  Awake, alert, oriented, using RUE to eat and drink w/o issues, minimal baseline aphasia stable   Data: Carotid US reviewed:  1. Right carotid artery system: Less than 50% stenosis secondary to moderate multifocal atherosclerotic plaque formation. 2. Left carotid artery system: Less than 50% stenosis secondary to moderate atherosclerotic plaque formation. High resistance wave throughout the left carotid system suggestive of intracranial vascular abnormality such as distal ICA occlusion. 3.  Vertebral artery system: Patent with antegrade flow bilaterally.  Lab Results  Component Value Date   HGBA1C 6.8 (H) 05/20/2022   ECHO:  1. Severe asymmetric LVH. no LVOT obstruction. consider CMR to evaluate  HCM. Left ventricular ejection fraction, by estimation, is 35 to 40%. The  left ventricle has moderately decreased function. The left ventricle  demonstrates regional wall motion  abnormalities (see scoring diagram/findings for description). There is  severe asymmetric left ventricular hypertrophy of the septal segment. Left  ventricular diastolic parameters are indeterminate. There is severe  hypokinesis of the left ventricular,  entire inferolateral wall.   2. Right ventricular systolic function is normal.  The right ventricular  size is not well visualized.   3. The mitral valve is normal in structure. Mild mitral valve  regurgitation.   4. The aortic valve was not well visualized. Aortic valve regurgitation  is not visualized.  Normal atrial sizes reported (moderate left atrial enlargement on prior ECHO 12/19/2021 however)  Recommendations: -Patient now agreeable to change pravastatin 40 to atorvastatin 40  mg nightly, ordered -90 days of DAPT, then ticagrelor monotherapy  -30 day Holter monitor on discharge, consider loop recorder if this is negative -T4 given elevated TSH as inadequately treated hypothyroidism can contribute to cardiac issues; adjustments to meds per primary team, PCP or endocrinology -Long term BP goal is normotension but would achieve this gradually over 1-4 weeks -Outpatient repeat MRA head in 2-3 months to assess for recanalization; given renal function and clinical stability no indication for acute intervention   -Cardiology follow-up for ECHO findings of possible HCM -Neurology will be available as needed going forward   Discussed with Dr. Posey Pronto via secure chat  Lesleigh Noe MD-PhD [facility] [phone] [facility] coverage for [facility] is from 8 AM to 4 AM in-house and 4 PM to 8 PM by telephone/video. 8 PM to 8 AM emergent questions or overnight urgent questions should be addressed to Teleneurology On-call or [facility] Pontes neurohospitalist; contact information can be found on AMION

## 2022-05-21 NOTE — Progress Notes (Addendum)
S: No further right sided weakness / numbness or language issues Patient remains stable at her baseline  No other acute complaints Willing to try higher intensity statin, reports poor diet in the setting of social stressors x 1 year  O: Current vital signs: BP (!) 162/84 (BP Location: Left Arm)   Pulse 62   Temp 97.8 F (36.6 C)   Resp 17   Ht 5\' 6"  (1.676 m)   Wt 85.3 kg   SpO2 98%   BMI 30.34 kg/m  Vital signs in last 24 hours: Temp:  [97.5 F (36.4 C)-99 F (37.2 C)] 97.8 F (36.6 C) (12/14 0826) Pulse Rate:  [58-67] 62 (12/14 0826) Resp:  [14-21] 17 (12/14 0826) BP: (155-178)/(69-97) 162/84 (12/14 0826) SpO2:  [98 %-100 %] 98 % (12/14 0826)  Exam:  Comfortable  Awake, alert, oriented, using RUE to eat and drink w/o issues, minimal baseline aphasia stable   Data: Carotid US reviewed:  1. Right carotid artery system: Less than 50% stenosis secondary to moderate multifocal atherosclerotic plaque formation. 2. Left carotid artery system: Less than 50% stenosis secondary to moderate atherosclerotic plaque formation. High resistance wave throughout the left carotid system suggestive of intracranial vascular abnormality such as distal ICA occlusion. 3.  Vertebral artery system: Patent with antegrade flow bilaterally.  Lab Results  Component Value Date   HGBA1C 6.8 (H) 05/20/2022   ECHO:  1. Severe asymmetric LVH. no LVOT obstruction. consider CMR to evaluate  HCM. Left ventricular ejection fraction, by estimation, is 35 to 40%. The  left ventricle has moderately decreased function. The left ventricle  demonstrates regional wall motion  abnormalities (see scoring diagram/findings for description). There is  severe asymmetric left ventricular hypertrophy of the septal segment. Left  ventricular diastolic parameters are indeterminate. There is severe  hypokinesis of the left ventricular,  entire inferolateral wall.   2. Right ventricular systolic function is normal.  The right ventricular  size is not well visualized.   3. The mitral valve is normal in structure. Mild mitral valve  regurgitation.   4. The aortic valve was not well visualized. Aortic valve regurgitation  is not visualized.  Normal atrial sizes reported (moderate left atrial enlargement on prior ECHO 12/19/2021 however)  Recommendations: -Patient now agreeable to change pravastatin 40 to atorvastatin 40  mg nightly, ordered -90 days of DAPT, then ticagrelor monotherapy  -30 day Holter monitor on discharge, consider loop recorder if this is negative -T4 given elevated TSH as inadequately treated hypothyroidism can contribute to cardiac issues; adjustments to meds per primary team, PCP or endocrinology -Long term BP goal is normotension but would achieve this gradually over 1-4 weeks -Outpatient repeat MRA head in 2-3 months to assess for recanalization; given renal function and clinical stability no indication for acute intervention   -Outpatient sleep study for OSA eval and consideration of CPAP -Cardiology follow-up for ECHO findings of possible HCM -Neurology will be available as needed going forward   Discussed with Dr. Posey Pronto via secure chat  Lesleigh Noe MD-PhD [facility] [phone] [facility] coverage for [facility] is from 8 AM to 4 AM in-house and 4 PM to 8 PM by telephone/video. 8 PM to 8 AM emergent questions or overnight urgent questions should be addressed to Teleneurology On-call or Zacarias Pontes neurohospitalist; contact information can be found on AMION

## 2022-06-24 DIAGNOSIS — M1A3710 Chronic gout due to renal impairment, right ankle and foot, without tophus (tophi): Secondary | ICD-10-CM | POA: Insufficient documentation

## 2022-07-02 ENCOUNTER — Ambulatory Visit (INDEPENDENT_AMBULATORY_CARE_PROVIDER_SITE_OTHER): Payer: Medicare Other | Admitting: Nurse Practitioner

## 2022-07-02 ENCOUNTER — Encounter (INDEPENDENT_AMBULATORY_CARE_PROVIDER_SITE_OTHER): Payer: Self-pay | Admitting: Nurse Practitioner

## 2022-07-02 VITALS — BP 95/62 | HR 75 | Resp 16 | Wt 170.8 lb

## 2022-07-02 DIAGNOSIS — I10 Essential (primary) hypertension: Secondary | ICD-10-CM | POA: Diagnosis not present

## 2022-07-02 DIAGNOSIS — E782 Mixed hyperlipidemia: Secondary | ICD-10-CM

## 2022-07-02 DIAGNOSIS — I6522 Occlusion and stenosis of left carotid artery: Secondary | ICD-10-CM

## 2022-07-13 ENCOUNTER — Encounter (INDEPENDENT_AMBULATORY_CARE_PROVIDER_SITE_OTHER): Payer: Self-pay | Admitting: Nurse Practitioner

## 2022-07-13 NOTE — H&P (View-Only) (Signed)
Subjective:    Patient ID: [name], female    DOB: 12/20/1950, 72 y.o.   MRN: [id number] Chief Complaint  Patient presents with   New Patient (Initial Visit)    Ref  Ouida Sills consult left carotid artery disease    [name] is a 72 year old female who presents today for evaluation of carotid artery stenosis.  We have previously seen the patient in 2021 however this was for lower extremity peripheral arterial disease.  The patient had a carotid duplex studies in early April 2023 which showed a 1 to 49% stenosis bilaterally.  The patient had a stroke 05/19/2022 which resulted in right-sided weakness and numbness.  The symptoms are much better than they were initially.  However in the midst of workup it was revealed by carotid duplex that the patient had high resistance waveforms in her left ICA suggestive of possible distal ICA occlusion.  The patient's MRI notes that there is occlusion but there is some form of collateral flow.    Review of Systems  Neurological:  Positive for weakness.  All other systems reviewed and are negative.      Objective:   Physical Exam Vitals reviewed.  HENT:     Head: Normocephalic.  Neck:     Vascular: No carotid bruit.  Cardiovascular:     Rate and Rhythm: Normal rate.  Pulmonary:     Effort: Pulmonary effort is normal.  Skin:    General: Skin is warm and dry.  Neurological:     Mental Status: She is alert and oriented to person, place, and time.     Motor: Weakness present.  Psychiatric:        Mood and Affect: Mood normal.        Behavior: Behavior normal.        Thought Content: Thought content normal.        Judgment: Judgment normal.     BP 95/62 (BP Location: Left Arm)   Pulse 75   Resp 16   Wt 170 lb 12.8 oz (77.5 kg)   BMI 27.57 kg/m   Past Medical History:  Diagnosis Date   Acute CVA (cerebrovascular accident) ([place]) 12/18/2021   Acute gastric ulcer without hemorrhage or perforation    Acute on chronic systolic CHF  (congestive heart failure), NYHA class 3 ([place]) 08/03/2019   Formatting of this note might be different from the original. Global ef 35%   Anemia    Anemia, iron deficiency 11/07/2013   Arthritis    CAD (coronary artery disease)    Cerebrovascular accident [facility])    CHF (congestive heart failure) ([place])    Chicken pox    Chronic kidney disease    Colitis    ? 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   Sleep apnea    Type 2 diabetes mellitus with diabetic peripheral angiopathy without gangrene, without long-term current use of insulin ([place])     Social History  Socioeconomic History   Marital status: Single    Spouse name: Not on file   Number of children: 0   Years of education: Not on file   Highest education level: Not on file  Occupational History   Occupation: customer service    Employer: [employer]  Tobacco Use   Smoking status: Never   Smokeless tobacco: Never  Vaping Use   Vaping Use: Never used  Substance and Sexual Activity   Alcohol use: No   Drug use: No   Sexual activity: Not on file  Other Topics Concern   Not on file  Social History Narrative   Single, no children   Customer service   1 caffeine/day   Social Determinants of Health   Financial Resource Strain: Not on file  Food Insecurity: No Food Insecurity (05/20/2022)   Hunger Vital Sign    Worried About Running Out of Food in the Last Year: Never true    Ran Out of Food in the Last Year: Never true  Transportation Needs: No  Transportation Needs (05/20/2022)   PRAPARE - Hydrologist (Medical): No    Lack of Transportation (Non-Medical): No  Physical Activity: Not on file  Stress: Not on file  Social Connections: Not on file  Intimate Partner Violence: Not At Risk (05/20/2022)   Humiliation, Afraid, Rape, and Kick questionnaire    Fear of Current or Ex-Partner: No    Emotionally Abused: No    Physically Abused: No    Sexually Abused: No    Past Surgical History:  Procedure Laterality Date   ABDOMINAL HYSTERECTOMY     CHOLECYSTECTOMY     COLONOSCOPY WITH PROPOFOL N/A 12/24/2014   Procedure: COLONOSCOPY WITH PROPOFOL;  Surgeon: Hulen Luster, MD;  Location[facility] ENDOSCOPY;  Service: Gastroenterology;  Laterality: N/A;   CORONARY ARTERY BYPASS GRAFT     ESOPHAGOGASTRODUODENOSCOPY N/A 09/02/2017   Procedure: ESOPHAGOGASTRODUODENOSCOPY (EGD);  Surgeon: Virgel Manifold, MD;  Location: [facility] ENDOSCOPY;  Service: Endoscopy;  Laterality: N/A;   ESOPHAGOGASTRODUODENOSCOPY (EGD) WITH PROPOFOL N/A 12/24/2014   Procedure: ESOPHAGOGASTRODUODENOSCOPY (EGD) WITH PROPOFOL;  Surgeon: Hulen Luster, MD;  Location: [facility] ENDOSCOPY;  Service: Gastroenterology;  Laterality: N/A;   JOINT REPLACEMENT     LOWER EXTREMITY ANGIOGRAPHY Left 07/17/2019   Procedure: Lower Extremity Angiography;  Surgeon: Algernon Huxley, MD;  Location: [place] CV LAB;  Service: Cardiovascular;  Laterality: Left;   ROUX-EN-Y GASTRIC BYPASS     loss over 100 pounds   TONSILLECTOMY     TOTAL KNEE ARTHROPLASTY Left     Family History  Problem Relation Age of Onset   Coronary artery disease Other    Stroke Maternal Grandmother    Diabetes Paternal Grandmother        diabetic coma   Heart attack Father    Heart attack Mother    Heart attack Maternal Grandfather    Heart attack Paternal Grandfather    Breast cancer Neg Hx     Allergies  Allergen Reactions   Niacin Other (See Comments)    Heart Attack Other reaction(s):  Other (see comments) Heart Attack   Tramadol     Other reaction(s): Other (see comments) Intolerance, makes woozy   Ultram [Tramadol Hcl]     Intolerance, makes woozy   Baclofen Other (See Comments)    Dizziness Other reaction(s): Other (see comments) Dizziness   Cardura [Doxazosin Mesylate] Other (See Comments)    Dizzy with passing out spells   Doxazosin  Other reaction(s): Other (see comments) Dizzy with passing out spells Dizzy with passing out spells   Morphine And Related Nausea And Vomiting and Nausea Only   Other Rash    Gold metal (nickel in the gold) Gold metal Gold metal (nickel in the gold)       Latest Ref Rng & Units 05/19/2022    3:54 PM 04/07/2022    3:10 PM 12/17/2021    4:23 PM  CBC  WBC 4.0 - 10.5 K/uL 5.4  7.4  6.6   Hemoglobin 12.0 - 15.0 g/dL 9.2  10.2  8.6   Hematocrit 36.0 - 46.0 % 33.0  35.4  29.6   Platelets 150 - 400 K/uL 327  293  288       CMP     Component Value Date/Time   NA 138 05/19/2022 1554   NA 144 02/03/2012 0426   K 4.2 05/19/2022 1554   K 4.4 02/03/2012 0426   CL 108 05/19/2022 1554   CL 113 (H) 02/03/2012 0426   CO2 25 05/19/2022 1554   CO2 24 02/03/2012 0426   GLUCOSE 186 (H) 05/19/2022 1554   GLUCOSE 153 (H) 02/03/2012 0426   BUN 31 (H) 05/19/2022 1554   BUN 22 (H) 02/03/2012 0426   CREATININE 1.72 (H) 05/19/2022 1554   CREATININE 1.76 (H) 08/18/2013 1019   CALCIUM 8.2 (L) 05/19/2022 1554   CALCIUM 8.1 (L) 02/03/2012 0426   PROT 6.9 05/19/2022 1554   ALBUMIN 3.4 (L) 05/19/2022 1554   AST 15 05/19/2022 1554   ALT 9 05/19/2022 1554   ALKPHOS 101 05/19/2022 1554   BILITOT 0.4 05/19/2022 1554   GFRNONAA 31 (L) 05/19/2022 1554   GFRNONAA 30 (L) 08/18/2013 1019   GFRAA 31 (L) 07/19/2019 0211   GFRAA 35 (L) 08/18/2013 1019     No results found.     Assessment & Plan:   1. Stenosis of left carotid artery Patient's MRA and carotid duplex offer conflicting information.  The MRA simply notes that the left  ICA is occluded with some collateralization.  However the carotid duplex notes that there is flow but with high flow resistance waveforms.  This could be noted due to the occlusion but typically reocclusion should not be but is felt to be for flow.  Based on this we will have the patient undergo carotid angiogram to determine if there is a true occlusion or if there is a role for intervention.  I have discussed the risk, benefits and alternatives with the patient.  2. HYPERTENSION, BENIGN Continue antihypertensive medications as already ordered, these medications have been reviewed and there are no changes at this time.  3. Hyperlipidemia, mixed Continue statin as ordered and reviewed, no changes at this time   Current Outpatient Medications on File Prior to Visit  Medication Sig Dispense Refill   aspirin EC 81 MG tablet Take 1 tablet (81 mg total) by mouth daily. 90 tablet 0   atorvastatin (LIPITOR) 40 MG tablet Take 1 tablet (40 mg total) by mouth at bedtime. 30 tablet 1   blood glucose meter kit and supplies KIT Dispense based on patient and insurance preference. Use up to four times daily as directed. 1 each 0   cloNIDine (CATAPRES) 0.1 MG tablet Take 0.1 mg by mouth 2 (two) times daily.     diphenhydrAMINE (BENADRYL) 50 MG capsule Take 50 mg by mouth at bedtime as needed for sleep (Takes Benadryl 50 mg with 1 tab Tylenol PM qHS as needed).  furosemide (LASIX) 40 MG tablet Take 40 mg by mouth daily.     levothyroxine (SYNTHROID) 200 MCG tablet Take 200 mcg by mouth as directed. Take 1 tablet (200 mcg total) by mouth every Monday, Wednesday, and Friday Take on an empty stomach with a glass of water at least 30-60 minutes before breakfast.     losartan (COZAAR) 25 MG tablet Take 25 mg by mouth daily.     Multiple Vitamin (MULTIVITAMIN WITH MINERALS) TABS tablet Take 1 tablet by mouth daily.     pantoprazole (PROTONIX) 20 MG tablet Take 20 mg by mouth daily.     ticagrelor (BRILINTA) 90 MG  TABS tablet Take 1 tablet (90 mg total) by mouth 2 (two) times daily. 60 tablet 3   No current facility-administered medications on file prior to visit.    There are no Patient Instructions on file for this visit. No follow-ups on file.   Kris Hartmann, NP

## 2022-07-13 NOTE — Progress Notes (Signed)
Subjective:    Patient ID: [name], female    DOB: 12/20/1950, 72 y.o.   MRN: [id number] Chief Complaint  Patient presents with   New Patient (Initial Visit)    Ref  Ouida Sills consult left carotid artery disease    [name] is a 72 year old female who presents today for evaluation of carotid artery stenosis.  We have previously seen the patient in 2021 however this was for lower extremity peripheral arterial disease.  The patient had a carotid duplex studies in early April 2023 which showed a 1 to 49% stenosis bilaterally.  The patient had a stroke 05/19/2022 which resulted in right-sided weakness and numbness.  The symptoms are much better than they were initially.  However in the midst of workup it was revealed by carotid duplex that the patient had high resistance waveforms in her left ICA suggestive of possible distal ICA occlusion.  The patient's MRI notes that there is occlusion but there is some form of collateral flow.    Review of Systems  Neurological:  Positive for weakness.  All other systems reviewed and are negative.      Objective:   Physical Exam Vitals reviewed.  HENT:     Head: Normocephalic.  Neck:     Vascular: No carotid bruit.  Cardiovascular:     Rate and Rhythm: Normal rate.  Pulmonary:     Effort: Pulmonary effort is normal.  Skin:    General: Skin is warm and dry.  Neurological:     Mental Status: She is alert and oriented to person, place, and time.     Motor: Weakness present.  Psychiatric:        Mood and Affect: Mood normal.        Behavior: Behavior normal.        Thought Content: Thought content normal.        Judgment: Judgment normal.     BP 95/62 (BP Location: Left Arm)   Pulse 75   Resp 16   Wt 170 lb 12.8 oz (77.5 kg)   BMI 27.57 kg/m   Past Medical History:  Diagnosis Date   Acute CVA (cerebrovascular accident) ([place]) 12/18/2021   Acute gastric ulcer without hemorrhage or perforation    Acute on chronic systolic CHF  (congestive heart failure), NYHA class 3 ([place]) 08/03/2019   Formatting of this note might be different from the original. Global ef 35%   Anemia    Anemia, iron deficiency 11/07/2013   Arthritis    CAD (coronary artery disease)    Cerebrovascular accident [facility])    CHF (congestive heart failure) ([place])    Chicken pox    Chronic kidney disease    Colitis    ? 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   Sleep apnea    Type 2 diabetes mellitus with diabetic peripheral angiopathy without gangrene, without long-term current use of insulin ([place])     Social History  Socioeconomic History   Marital status: Single    Spouse name: Not on file   Number of children: 0   Years of education: Not on file   Highest education level: Not on file  Occupational History   Occupation: customer service    Employer: [employer]  Tobacco Use   Smoking status: Never   Smokeless tobacco: Never  Vaping Use   Vaping Use: Never used  Substance and Sexual Activity   Alcohol use: No   Drug use: No   Sexual activity: Not on file  Other Topics Concern   Not on file  Social History Narrative   Single, no children   Customer service   1 caffeine/day   Social Determinants of Health   Financial Resource Strain: Not on file  Food Insecurity: No Food Insecurity (05/20/2022)   Hunger Vital Sign    Worried About Running Out of Food in the Last Year: Never true    Ran Out of Food in the Last Year: Never true  Transportation Needs: No  Transportation Needs (05/20/2022)   PRAPARE - Hydrologist (Medical): No    Lack of Transportation (Non-Medical): No  Physical Activity: Not on file  Stress: Not on file  Social Connections: Not on file  Intimate Partner Violence: Not At Risk (05/20/2022)   Humiliation, Afraid, Rape, and Kick questionnaire    Fear of Current or Ex-Partner: No    Emotionally Abused: No    Physically Abused: No    Sexually Abused: No    Past Surgical History:  Procedure Laterality Date   ABDOMINAL HYSTERECTOMY     CHOLECYSTECTOMY     COLONOSCOPY WITH PROPOFOL N/A 12/24/2014   Procedure: COLONOSCOPY WITH PROPOFOL;  Surgeon: Hulen Luster, MD;  Location[facility] ENDOSCOPY;  Service: Gastroenterology;  Laterality: N/A;   CORONARY ARTERY BYPASS GRAFT     ESOPHAGOGASTRODUODENOSCOPY N/A 09/02/2017   Procedure: ESOPHAGOGASTRODUODENOSCOPY (EGD);  Surgeon: Virgel Manifold, MD;  Location: [facility] ENDOSCOPY;  Service: Endoscopy;  Laterality: N/A;   ESOPHAGOGASTRODUODENOSCOPY (EGD) WITH PROPOFOL N/A 12/24/2014   Procedure: ESOPHAGOGASTRODUODENOSCOPY (EGD) WITH PROPOFOL;  Surgeon: Hulen Luster, MD;  Location: [facility] ENDOSCOPY;  Service: Gastroenterology;  Laterality: N/A;   JOINT REPLACEMENT     LOWER EXTREMITY ANGIOGRAPHY Left 07/17/2019   Procedure: Lower Extremity Angiography;  Surgeon: Algernon Huxley, MD;  Location: [place] [facility] LAB;  Service: Cardiovascular;  Laterality: Left;   ROUX-EN-Y GASTRIC BYPASS     loss over 100 pounds   TONSILLECTOMY     TOTAL KNEE ARTHROPLASTY Left     Family History  Problem Relation Age of Onset   Coronary artery disease Other    Stroke Maternal Grandmother    Diabetes Paternal Grandmother        diabetic coma   Heart attack Father    Heart attack Mother    Heart attack Maternal Grandfather    Heart attack Paternal Grandfather    Breast cancer Neg Hx     Allergies  Allergen Reactions   Niacin Other (See Comments)    Heart Attack Other reaction(s):  Other (see comments) Heart Attack   Tramadol     Other reaction(s): Other (see comments) Intolerance, makes woozy   Ultram [Tramadol Hcl]     Intolerance, makes woozy   Baclofen Other (See Comments)    Dizziness Other reaction(s): Other (see comments) Dizziness   Cardura [Doxazosin Mesylate] Other (See Comments)    Dizzy with passing out spells   Doxazosin  Other reaction(s): Other (see comments) Dizzy with passing out spells Dizzy with passing out spells   Morphine And Related Nausea And Vomiting and Nausea Only   Other Rash    Gold metal (nickel in the gold) Gold metal Gold metal (nickel in the gold)       Latest Ref Rng & Units 05/19/2022    3:54 PM 04/07/2022    3:10 PM 12/17/2021    4:23 PM  CBC  WBC 4.0 - 10.5 K/uL 5.4  7.4  6.6   Hemoglobin 12.0 - 15.0 g/dL 9.2  10.2  8.6   Hematocrit 36.0 - 46.0 % 33.0  35.4  29.6   Platelets 150 - 400 K/uL 327  293  288       CMP     Component Value Date/Time   NA 138 05/19/2022 1554   NA 144 02/03/2012 0426   K 4.2 05/19/2022 1554   K 4.4 02/03/2012 0426   CL 108 05/19/2022 1554   CL 113 (H) 02/03/2012 0426   CO2 25 05/19/2022 1554   CO2 24 02/03/2012 0426   GLUCOSE 186 (H) 05/19/2022 1554   GLUCOSE 153 (H) 02/03/2012 0426   BUN 31 (H) 05/19/2022 1554   BUN 22 (H) 02/03/2012 0426   CREATININE 1.72 (H) 05/19/2022 1554   CREATININE 1.76 (H) 08/18/2013 1019   CALCIUM 8.2 (L) 05/19/2022 1554   CALCIUM 8.1 (L) 02/03/2012 0426   PROT 6.9 05/19/2022 1554   ALBUMIN 3.4 (L) 05/19/2022 1554   AST 15 05/19/2022 1554   ALT 9 05/19/2022 1554   ALKPHOS 101 05/19/2022 1554   BILITOT 0.4 05/19/2022 1554   GFRNONAA 31 (L) 05/19/2022 1554   GFRNONAA 30 (L) 08/18/2013 1019   GFRAA 31 (L) 07/19/2019 0211   GFRAA 35 (L) 08/18/2013 1019     No results found.     Assessment & Plan:   1. Stenosis of left carotid artery Patient's MRA and carotid duplex offer conflicting information.  The MRA simply notes that the left  ICA is occluded with some collateralization.  However the carotid duplex notes that there is flow but with high flow resistance waveforms.  This could be noted due to the occlusion but typically reocclusion should not be but is felt to be for flow.  Based on this we will have the patient undergo carotid angiogram to determine if there is a true occlusion or if there is a role for intervention.  I have discussed the risk, benefits and alternatives with the patient.  2. HYPERTENSION, BENIGN Continue antihypertensive medications as already ordered, these medications have been reviewed and there are no changes at this time.  3. Hyperlipidemia, mixed Continue statin as ordered and reviewed, no changes at this time   Current Outpatient Medications on File Prior to Visit  Medication Sig Dispense Refill   aspirin EC 81 MG tablet Take 1 tablet (81 mg total) by mouth daily. 90 tablet 0   atorvastatin (LIPITOR) 40 MG tablet Take 1 tablet (40 mg total) by mouth at bedtime. 30 tablet 1   blood glucose meter kit and supplies KIT Dispense based on patient and insurance preference. Use up to four times daily as directed. 1 each 0   cloNIDine (CATAPRES) 0.1 MG tablet Take 0.1 mg by mouth 2 (two) times daily.     diphenhydrAMINE (BENADRYL) 50 MG capsule Take 50 mg by mouth at bedtime as needed for sleep (Takes Benadryl 50 mg with 1 tab Tylenol PM qHS as needed).  furosemide (LASIX) 40 MG tablet Take 40 mg by mouth daily.     levothyroxine (SYNTHROID) 200 MCG tablet Take 200 mcg by mouth as directed. Take 1 tablet (200 mcg total) by mouth every Monday, Wednesday, and Friday Take on an empty stomach with a glass of water at least 30-60 minutes before breakfast.     losartan (COZAAR) 25 MG tablet Take 25 mg by mouth daily.     Multiple Vitamin (MULTIVITAMIN WITH MINERALS) TABS tablet Take 1 tablet by mouth daily.     pantoprazole (PROTONIX) 20 MG tablet Take 20 mg by mouth daily.     ticagrelor (BRILINTA) 90 MG  TABS tablet Take 1 tablet (90 mg total) by mouth 2 (two) times daily. 60 tablet 3   No current facility-administered medications on file prior to visit.    There are no Patient Instructions on file for this visit. No follow-ups on file.   Kris Hartmann, NP

## 2022-07-15 ENCOUNTER — Telehealth (INDEPENDENT_AMBULATORY_CARE_PROVIDER_SITE_OTHER): Payer: Self-pay

## 2022-07-15 NOTE — Telephone Encounter (Signed)
Spoke with the patient and she is scheduled with Dr. Delana Meyer on 07/21/22 with a 2:30 pm [facility]. Pre-procedure instructions were discussed and will be mailed.

## 2022-07-21 ENCOUNTER — Ambulatory Visit
Admission: RE | Admit: 2022-07-21 | Discharge: 2022-07-21 | Disposition: A | Discharge status: Home / Self Care | Payer: Medicare Other | Attending: Vascular Surgery | Admitting: Vascular Surgery

## 2022-07-21 ENCOUNTER — Encounter
Admission: RE | Disposition: A | Discharge status: Home / Self Care | Payer: Self-pay | Source: Home / Self Care | Attending: Vascular Surgery

## 2022-07-21 ENCOUNTER — Other Ambulatory Visit: Payer: Self-pay

## 2022-07-21 ENCOUNTER — Encounter: Payer: Self-pay | Admitting: Vascular Surgery

## 2022-07-21 DIAGNOSIS — I5023 Acute on chronic systolic (congestive) heart failure: Secondary | ICD-10-CM | POA: Diagnosis not present

## 2022-07-21 DIAGNOSIS — E782 Mixed hyperlipidemia: Secondary | ICD-10-CM | POA: Diagnosis not present

## 2022-07-21 DIAGNOSIS — I110 Hypertensive heart disease with heart failure: Secondary | ICD-10-CM | POA: Diagnosis not present

## 2022-07-21 DIAGNOSIS — E1151 Type 2 diabetes mellitus with diabetic peripheral angiopathy without gangrene: Secondary | ICD-10-CM | POA: Diagnosis not present

## 2022-07-21 DIAGNOSIS — I6522 Occlusion and stenosis of left carotid artery: Secondary | ICD-10-CM | POA: Insufficient documentation

## 2022-07-21 DIAGNOSIS — Z8673 Personal history of transient ischemic attack (TIA), and cerebral infarction without residual deficits: Secondary | ICD-10-CM | POA: Diagnosis not present

## 2022-07-21 HISTORY — PX: CAROTID ANGIOGRAPHY: CATH118230

## 2022-07-21 LAB — CREATININE, SERUM
Creatinine, Ser: 1.82 mg/dL — ABNORMAL HIGH (ref 0.44–1.00)
GFR, Estimated: 29 mL/min — ABNORMAL LOW (ref >60)

## 2022-07-21 LAB — GLUCOSE, CAPILLARY
Glucose-Capillary: 142 mg/dL — ABNORMAL HIGH (ref 70–99)
Glucose-Capillary: 131 mg/dL — ABNORMAL HIGH (ref 70–99)

## 2022-07-21 LAB — BUN: BUN: 32 mg/dL — ABNORMAL HIGH (ref 8–23)

## 2022-07-21 SURGERY — CAROTID ANGIOGRAPHY
Anesthesia: Moderate Sedation | Laterality: Left

## 2022-07-21 MED ORDER — CEFAZOLIN SODIUM-DEXTROSE 2-4 GM/100ML-% IV SOLN
INTRAVENOUS | Status: AC
  Administered 2022-07-21: 2 g via INTRAVENOUS
  Filled 2022-07-21: qty 100

## 2022-07-21 MED ORDER — MIDAZOLAM HCL 2 MG/ML PO SYRP: 8.0000 mg | ORAL_SOLUTION | Freq: Once | ORAL | Status: DC | PRN

## 2022-07-21 MED ORDER — FENTANYL CITRATE (PF) 100 MCG/2ML IJ SOLN
INTRAMUSCULAR | Status: DC | PRN
  Administered 2022-07-21 (×2): 25 ug via INTRAVENOUS

## 2022-07-21 MED ORDER — DIPHENHYDRAMINE HCL 50 MG/ML IJ SOLN: 50.0000 mg | Freq: Once | INTRAMUSCULAR | Status: DC | PRN

## 2022-07-21 MED ORDER — IODIXANOL 320 MG/ML IV SOLN
INTRAVENOUS | Status: DC | PRN
  Administered 2022-07-21: 25 mL via INTRA_ARTERIAL

## 2022-07-21 MED ORDER — HYDROMORPHONE HCL 1 MG/ML IJ SOLN: 1.0000 mg | Freq: Once | INTRAMUSCULAR | Status: DC | PRN

## 2022-07-21 MED ORDER — HEPARIN SODIUM (PORCINE) 1000 UNIT/ML IJ SOLN
INTRAMUSCULAR | Status: DC | PRN
  Administered 2022-07-21: 5000 [IU] via INTRAVENOUS

## 2022-07-21 MED ORDER — FAMOTIDINE 20 MG PO TABS: 40.0000 mg | ORAL_TABLET | Freq: Once | ORAL | Status: DC | PRN

## 2022-07-21 MED ORDER — HEPARIN SODIUM (PORCINE) 1000 UNIT/ML IJ SOLN
INTRAMUSCULAR | Status: AC
  Filled 2022-07-21: qty 10

## 2022-07-21 MED ORDER — OXYCODONE HCL 5 MG PO TABS: 5.0000 mg | ORAL_TABLET | ORAL | Status: DC | PRN

## 2022-07-21 MED ORDER — SODIUM CHLORIDE 0.9% FLUSH: 3.0000 mL | INTRAVENOUS | Status: DC | PRN

## 2022-07-21 MED ORDER — HYDRALAZINE HCL 20 MG/ML IJ SOLN: 5.0000 mg | INTRAMUSCULAR | Status: DC | PRN

## 2022-07-21 MED ORDER — ONDANSETRON HCL 4 MG/2ML IJ SOLN: 4.0000 mg | Freq: Four times a day (QID) | INTRAMUSCULAR | Status: DC | PRN

## 2022-07-21 MED ORDER — METHYLPREDNISOLONE SODIUM SUCC 125 MG IJ SOLR: 125.0000 mg | Freq: Once | INTRAMUSCULAR | Status: DC | PRN

## 2022-07-21 MED ORDER — ACETAMINOPHEN 325 MG PO TABS: 650.0000 mg | ORAL_TABLET | ORAL | Status: DC | PRN

## 2022-07-21 MED ORDER — SODIUM CHLORIDE 0.9 % IV SOLN: INTRAVENOUS | Status: DC

## 2022-07-21 MED ORDER — MIDAZOLAM HCL 2 MG/2ML IJ SOLN
INTRAMUSCULAR | Status: AC
  Filled 2022-07-21: qty 2

## 2022-07-21 MED ORDER — SODIUM CHLORIDE 0.9 % IV SOLN: 250.0000 mL | INTRAVENOUS | Status: DC | PRN

## 2022-07-21 MED ORDER — MIDAZOLAM HCL 2 MG/2ML IJ SOLN
INTRAMUSCULAR | Status: DC | PRN
  Administered 2022-07-21 (×2): 1 mg via INTRAVENOUS

## 2022-07-21 MED ORDER — LABETALOL HCL 5 MG/ML IV SOLN: 10.0000 mg | INTRAVENOUS | Status: DC | PRN

## 2022-07-21 MED ORDER — FENTANYL CITRATE PF 50 MCG/ML IJ SOSY
PREFILLED_SYRINGE | INTRAMUSCULAR | Status: AC
  Filled 2022-07-21: qty 1

## 2022-07-21 MED ORDER — SODIUM CHLORIDE 0.9% FLUSH: 3.0000 mL | Freq: Two times a day (BID) | INTRAVENOUS | Status: DC

## 2022-07-21 MED ORDER — CEFAZOLIN SODIUM-DEXTROSE 2-4 GM/100ML-% IV SOLN: 2.0000 g | INTRAVENOUS | Status: AC

## 2022-07-21 MED ORDER — MORPHINE SULFATE (PF) 4 MG/ML IV SOLN: 2.0000 mg | INTRAVENOUS | Status: DC | PRN

## 2022-07-21 SURGICAL SUPPLY — 17 items
CATH ANGIO 5F PIGTAIL 100CM (CATHETERS) IMPLANT
CATH HEADHUNTER H1 5F 100CM (CATHETERS) IMPLANT
CATH MICROCATH PRGRT 2.8F 130 (MICROCATHETER) IMPLANT
COVER PROBE ULTRASOUND 5X96 (MISCELLANEOUS) IMPLANT
DEVICE STARCLOSE SE CLOSURE (Vascular Products) IMPLANT
DEVICE TORQUE .025-.038 (MISCELLANEOUS) IMPLANT
GLIDEWIRE ANGLED SS 035X260CM (WIRE) IMPLANT
GUIDEWIRE VASC STIFF .038X260 (WIRE) IMPLANT
MICROCATH PROGREAT 2.8F 130CM (MICROCATHETER) ×1
NDL ENTRY 21GA 7CM ECHOTIP (NEEDLE) IMPLANT
NEEDLE ENTRY 21GA 7CM ECHOTIP (NEEDLE) ×1 IMPLANT
PACK ANGIOGRAPHY (CUSTOM PROCEDURE TRAY) ×1 IMPLANT
SET INTRO CAPELLA COAXIAL (SET/KITS/TRAYS/PACK) IMPLANT
SHEATH BRITE TIP 6FRX11 (SHEATH) IMPLANT
SYR MEDRAD MARK 7 150ML (SYRINGE) IMPLANT
TUBING CONTRAST HIGH PRESS 72 (TUBING) IMPLANT
WIRE GUIDERIGHT .035X150 (WIRE) IMPLANT

## 2022-07-21 NOTE — Interval H&P Note (Signed)
History and Physical Interval Note:  07/21/2022 3:40 PM  [name]  has presented today for surgery, with the diagnosis of Carotid Angio    L carotid artery stenosis.  The various methods of treatment have been discussed with the patient and family. After consideration of risks, benefits and other options for treatment, the patient has consented to  Procedure(s): CAROTID ANGIOGRAPHY (Left) as a surgical intervention.  The patient's history has been reviewed, patient examined, no change in status, stable for surgery.  I have reviewed the patient's chart and labs.  Questions were answered to the patient's satisfaction.     Hortencia Pilar

## 2022-07-21 NOTE — Op Note (Signed)
[facility] [facility]   OPERATIVE NOTE  DATE: 07/21/2022  PRE-OPERATIVE DIAGNOSIS: 1.  Left carotid artery stenosis uncertain as to whether the left carotid is occluded with conflict and information from duplex ultrasound and CT.  POST-OPERATIVE DIAGNOSIS: Same as above  PROCEDURE: 1.   Ultrasound Guidance for vascular access right femoral artery 2.   Catheter placement into left common carotid artery from right femoral approach 3.   Thoracic aortogram 4.   Cervical and cerebral left carotid angiograms 5.   StarClose closure device right femoral artery  SURGEON: Hortencia Pilar, MD  ASSISTANT(S): None  ANESTHESIA: Moderate conscious sedation  ESTIMATED BLOOD LOSS: 10 cc  FLUORO TIME: 24 minutes  CONTRAST: 25 cc  MODERATE CONSCIOUS SEDATION TIME: Continuous ECG pulse oximetry and cardiopulmonary monitoring was performed throughout the entire procedure by the interventional radiology nurse.  Parenteral fentanyl and Versed were administered.  Approximately 24 minutes.  FINDING(S): 1.  There is a greater than 70% stenosis in the distal common carotid artery just proximal to the bulb.  There is a greater than 70% stenosis at the origin of the left internal carotid artery.  The left carotid occludes in the cavernous portion at the C5 level.  Ophthalmic artery is patent and the only outflow.  SPECIMEN(S):  None  INDICATIONS:   Patient is a 72 y.o.female who presents with disparate findings on several studies regarding her left carotid artery stenosis.  Catheter-based angiogram is performed for further evaluation. Risks and benefits are discussed and informed consent was obtained.  DESCRIPTION: After obtaining full informed written consent, the patient was brought back to the operating room and placed supine upon the vascular suite table.  After obtaining adequate anesthesia, the patient was prepped and draped in the standard fashion.  Moderate conscious sedation was  administered during a face to face encounter with the patient throughout the procedure with my supervision of the RN administering medicines and monitoring the patients vital signs and mental status throughout from the start of the procedure until the patient was taken to the recovery room. The right femoral artery was visualized with ultrasound and found to be calcific but patent. It was then accessed under direct ultrasound guidance without difficulty with a Seldinger needle. A J-wire and 5 French sheath were placed and a permanent image was recorded. The patient was given 3000 units of intravenous heparin. A pigtail catheter was placed into the ascending aorta and an LAO projection thoracic aortogram was performed. This showed type I arch no evidence of hemodynamically significant ostial stenosis of the great vessels.  I then selected a H1 catheter and cannulated the left common artery advancing into the mid left common carotid artery. Cervical and cerebral carotid angiography were then performed on the left side. The intracranial flow was found to be nonvisualized.  The common carotid and proximal internal carotid artery both had hemodynamically significant lesions however the internal carotid artery was open and there was flow visualized up to the cavernous portion.  However at this level there did not appear to be any further filling but this could not be confirmed with injection from the cervical carotid and therefore a prograde catheter is advanced through the H1 catheter and the prograde wire and catheter negotiated into the distal internal carotid artery.  Magnified imaging with selective injection of the distal internal carotid artery demonstrates that the internal carotid artery is occluded in the cavernous portion.  The primary outflow that is keeping the carotid open appears to be the  ophthalmic artery but there are no significant branches distal to the ophthalmic artery.    At this point, we had  imaging to plan our treatment and we elected to terminate the procedure. The diagnostic catheter was removed. Oblique arteriogram was performed of the right femoral artery and StarClose closure device was deployed in usual fashion with excellent hemostatic result. The patient tolerated the procedure well and was taken to the recovery room in stable condition.  COMPLICATIONS: None  CONDITION: Stable   Hortencia Pilar 07/21/2022 5:06 PM   This note was created with Dragon Medical transcription system. Any errors in dictation are purely unintentional.

## 2022-07-22 ENCOUNTER — Encounter: Payer: Self-pay | Admitting: Vascular Surgery

## 2022-08-11 DIAGNOSIS — I6529 Occlusion and stenosis of unspecified carotid artery: Secondary | ICD-10-CM | POA: Insufficient documentation

## 2022-08-11 DIAGNOSIS — I739 Peripheral vascular disease, unspecified: Secondary | ICD-10-CM | POA: Insufficient documentation

## 2022-08-11 NOTE — Progress Notes (Signed)
MRN : [id number]  [name] is a 72 y.o. (1950/11/28) female who presents with chief complaint of check carotid arteries.  History of Present Illness:   The patient is seen for follow up evaluation of carotid stenosis status post conventional angiogram. Angiogram was done 07/21/2022. Patient reports that the test went well with no problems or complications.   Findings:   There is a greater than 70% stenosis in the distal left common carotid artery just proximal to the bulb.  There is a greater than 70% stenosis at the origin of the left internal carotid artery.  The left carotid occludes in the cavernous portion at the C5 level.  Ophthalmic artery is patent and the only outflow.   The patient denies interval amaurosis fugax. There is no recent or interval TIA symptoms or focal motor deficits. There is no prior documented CVA.  The patient is taking enteric-coated aspirin 81 mg daily and Brilinta 90 mg bid.  There is no history of migraine headaches. There is no history of seizures.  No recent shortening of the patient's walking distance or new symptoms consistent with claudication.  No history of rest pain symptoms. No new ulcers or wounds of the lower extremities have occurred.  There is no history of DVT, PE or superficial thrombophlebitis. No recent episodes of angina or shortness of breath documented.    No outpatient medications have been marked as taking for the 08/13/22 encounter (Appointment) with Delana Meyer, Dolores Lory, MD.    Past Medical History:  Diagnosis Date   Acute CVA (cerebrovascular accident) ([place]) 12/18/2021   Acute gastric ulcer without hemorrhage or perforation    Acute on chronic systolic CHF (congestive heart failure), NYHA class 3 ([place]) 08/03/2019   Formatting of this note might be different from the original. Global ef 35%   Anemia    Anemia, iron deficiency 11/07/2013   Arthritis    CAD (coronary artery disease)    Cerebrovascular accident [facility])    CHF  (congestive heart failure) ([place])    Chicken pox    Chronic kidney disease    Colitis    ? 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   Sleep apnea    Type 2 diabetes mellitus with diabetic peripheral angiopathy without gangrene, without long-term current use of insulin [facility])     Past Surgical History:  Procedure Laterality Date   ABDOMINAL HYSTERECTOMY     CAROTID ANGIOGRAPHY Left 07/21/2022   Procedure: CAROTID ANGIOGRAPHY;  Surgeon: Katha Cabal, MD;  Location: [place] CV LAB;  Service: Cardiovascular;  Laterality: Left;   CHOLECYSTECTOMY     COLONOSCOPY WITH PROPOFOL N/A 12/24/2014   Procedure: COLONOSCOPY WITH PROPOFOL;  Surgeon: Hulen Luster, MD;  Location: [facility] ENDOSCOPY;  Service: Gastroenterology;  Laterality: N/A;   CORONARY ARTERY BYPASS GRAFT     ESOPHAGOGASTRODUODENOSCOPY N/A 09/02/2017   Procedure: ESOPHAGOGASTRODUODENOSCOPY (EGD);  Surgeon: Virgel Manifold, MD;  Location:  [facility] ENDOSCOPY;  Service: Endoscopy;  Laterality: N/A;   ESOPHAGOGASTRODUODENOSCOPY (EGD) WITH PROPOFOL N/A 12/24/2014   Procedure: ESOPHAGOGASTRODUODENOSCOPY (EGD) WITH PROPOFOL;  Surgeon: Hulen Luster, MD;  Location: [facility] ENDOSCOPY;  Service: Gastroenterology;  Laterality: N/A;   JOINT REPLACEMENT     LOWER EXTREMITY ANGIOGRAPHY Left 07/17/2019   Procedure: Lower Extremity Angiography;  Surgeon: Algernon Huxley, MD;  Location: [place] CV LAB;  Service: Cardiovascular;  Laterality: Left;   ROUX-EN-Y GASTRIC BYPASS      loss over 100 pounds   TONSILLECTOMY     TOTAL KNEE ARTHROPLASTY Left     Social History Social History   Tobacco Use   Smoking status: Never   Smokeless tobacco: Never  Vaping Use   Vaping Use: Never used  Substance Use Topics   Alcohol use: No   Drug use: No    Family History Family History  Problem Relation Age of Onset   Coronary artery disease Other    Stroke Maternal Grandmother    Diabetes Paternal Grandmother        diabetic coma   Heart attack Father    Heart attack Mother    Heart attack Maternal Grandfather    Heart attack Paternal Grandfather    Breast cancer Neg Hx     Allergies  Allergen Reactions   Niacin Other (See Comments)    Heart Attack Other reaction(s): Other (see comments) Heart Attack   Tramadol     Other reaction(s): Other (see comments) Intolerance, makes woozy   Ultram [Tramadol Hcl]     Intolerance, makes woozy   Baclofen Other (See Comments)    Dizziness Other reaction(s): Other (see comments) Dizziness   Cardura [Doxazosin Mesylate] Other (See Comments)    Dizzy with passing out spells   Doxazosin     Other reaction(s): Other (see comments) Dizzy with passing out spells Dizzy with passing out spells   Morphine And Related Nausea And Vomiting and Nausea Only   Other Rash    Gold metal (nickel in the gold) Gold metal Gold metal (nickel in the gold)     REVIEW OF SYSTEMS (Negative unless checked)  Constitutional: '[]'$ Weight loss  '[]'$ Fever  '[]'$ Chills Cardiac: '[]'$ Chest pain   '[]'$ Chest pressure   '[]'$ Palpitations   '[]'$ Shortness of breath when laying flat   '[]'$ Shortness of breath with exertion. Vascular:  '[x]'$ Pain in legs with walking   '[]'$ Pain in legs at rest  '[]'$ History of DVT   '[]'$ Phlebitis   '[x]'$ Swelling in legs   '[]'$ Varicose veins   '[]'$ Non-healing ulcers Pulmonary:   '[]'$ Uses home oxygen   '[]'$ Productive cough   '[]'$ Hemoptysis   '[]'$ Wheeze  '[]'$ COPD   '[]'$ Asthma Neurologic:  '[]'$ Dizziness   '[]'$ Seizures   '[x]'$ History of stroke   '[]'$ History of TIA   '[]'$ Aphasia   '[x]'$ Vissual changes   '[]'$ Weakness or numbness in arm   '[]'$ Weakness or numbness in leg Musculoskeletal:   '[]'$ Joint swelling   '[]'$ Joint pain   '[]'$ Low back pain Hematologic:  '[]'$ Easy bruising  '[]'$ Easy bleeding   '[]'$ Hypercoagulable state   '[]'$ Anemic Gastrointestinal:  '[]'$ Diarrhea   '[]'$ Vomiting  '[]'$ Gastroesophageal reflux/heartburn   '[]'$ Difficulty swallowing. Genitourinary:  '[x]'$ Chronic kidney disease   '[]'$ Difficult urination  '[]'$ Frequent urination   '[]'$ Blood in urine Skin:  '[]'$ Rashes   '[]'$ Ulcers  Psychological:  '[]'$ History of anxiety   '[]'$  History of major depression.  Physical Examination  There were no vitals filed for this visit. There is no height or weight on file to calculate BMI. Gen: WD/WN, NAD Head: [place]/AT, No temporalis wasting.  Ear/Nose/Throat: Hearing grossly intact, nares w/o erythema or drainage Eyes: PER, EOMI, sclera nonicteric.  Neck: Supple, no masses.  No bruit or JVD.  Pulmonary:  Good air movement, no audible wheezing, no use of accessory muscles.  Cardiac: RRR, normal S1, S2, no Murmurs. Vascular:   no carotid bruit noted Vessel Right Left  Radial Palpable Palpable  Carotid  Palpable  Palpable  Subclav  Palpable Palpable  Gastrointestinal: soft, non-distended. No guarding/no peritoneal signs.  Musculoskeletal: M/S 5/5 throughout.  No visible deformity.  Neurologic: CN 2-12 intact. Pain and light touch intact in extremities.  Symmetrical.  Speech is fluent. Motor exam as listed above. Psychiatric: Judgment intact, Mood & affect appropriate for pt's clinical situation. Dermatologic: No rashes or ulcers noted.  No changes consistent with cellulitis.   CBC Lab Results  Component Value Date   WBC 5.4 05/19/2022   HGB 9.2 (L) 05/19/2022   HCT 33.0 (L) 05/19/2022   MCV 83.5 05/19/2022   PLT 327 05/19/2022    BMET    Component Value Date/Time   NA 138 05/19/2022 1554   NA 144 02/03/2012 0426   K 4.2 05/19/2022 1554   K 4.4 02/03/2012 0426   CL 108 05/19/2022 1554   CL  113 (H) 02/03/2012 0426   CO2 25 05/19/2022 1554   CO2 24 02/03/2012 0426   GLUCOSE 186 (H) 05/19/2022 1554   GLUCOSE 153 (H) 02/03/2012 0426   BUN 32 (H) 07/21/2022 1507   BUN 22 (H) 02/03/2012 0426   CREATININE 1.82 (H) 07/21/2022 1507   CREATININE 1.76 (H) 08/18/2013 1019   CALCIUM 8.2 (L) 05/19/2022 1554   CALCIUM 8.1 (L) 02/03/2012 0426   GFRNONAA 29 (L) 07/21/2022 1507   GFRNONAA 30 (L) 08/18/2013 1019   GFRAA 31 (L) 07/19/2019 0211   GFRAA 35 (L) 08/18/2013 1019   CrCl cannot be calculated (Patient's most recent lab result is older than the maximum 21 days allowed.).  COAG Lab Results  Component Value Date   INR 1.1 05/19/2022   INR 1.2 12/17/2021   INR 1.2 07/17/2019    Radiology PERIPHERAL VASCULAR CATHETERIZATION  Result Date: 07/21/2022 See surgical note for result.    Assessment/Plan 1. Stenosis of carotid artery, unspecified laterality Recommend:  Given the patient's asymptomatic subcritical stenosis no further invasive testing or surgery at this time.  Duplex ultrasound shows 1-39% stenosis.  The left carotid system is very complicated:  There is a greater than 70% stenosis in the distal left common carotid artery just proximal to the bulb.  There is a greater than 70% stenosis at the origin of the left internal carotid artery.  The left carotid occludes in the cavernous portion at the C5 level.  Ophthalmic artery is patent and the only outflow.   Continue antiplatelet therapy as prescribed Continue management of CAD, HTN and Hyperlipidemia Healthy heart diet,  encouraged exercise at least 4 times per week Follow up in 6 months with duplex ultrasound and physical exam   - VAS US CAROTID; Future  2. PAD (peripheral artery disease) (HCC)  Recommend:  The patient has evidence of atherosclerosis of the lower extremities with claudication.  The patient does not voice lifestyle limiting changes at this point in time.  Noninvasive studies do not suggest  clinically significant change.  No invasive studies, angiography or surgery at this time The patient should continue walking and begin a more formal exercise program.  The patient should continue antiplatelet therapy and aggressive treatment of the lipid abnormalities  No changes in the patient's medications at this time  Continued surveillance is indicated as atherosclerosis is likely to progress with time.    The patient will continue follow up with noninvasive studies as ordered.   3. Type 2 diabetes mellitus without complication, unspecified whether long term insulin use ([place]) Continue hypoglycemic medications as already ordered, these medications have been reviewed and there are no changes at this time.  Hgb A1C to be monitored as already arranged by primary service  4. HYPERTENSION, BENIGN Continue antihypertensive medications as already ordered, these medications have been reviewed and there are no changes at this time.  5. Atherosclerosis of coronary artery bypass graft of native heart without angina pectoris Continue cardiac and antihypertensive medications as already ordered and reviewed, no changes at this time.  Continue statin as ordered and reviewed, no changes at this time  Nitrates PRN for chest pain     Hortencia Pilar, MD  08/11/2022 4:18 PM

## 2022-08-13 ENCOUNTER — Encounter (INDEPENDENT_AMBULATORY_CARE_PROVIDER_SITE_OTHER): Payer: Self-pay | Admitting: Vascular Surgery

## 2022-08-13 ENCOUNTER — Ambulatory Visit (INDEPENDENT_AMBULATORY_CARE_PROVIDER_SITE_OTHER): Payer: Medicare Other | Admitting: Vascular Surgery

## 2022-08-13 VITALS — BP 120/73 | HR 62 | Resp 18 | Ht 66.0 in | Wt 169.0 lb

## 2022-08-13 DIAGNOSIS — I10 Essential (primary) hypertension: Secondary | ICD-10-CM

## 2022-08-13 DIAGNOSIS — E119 Type 2 diabetes mellitus without complications: Secondary | ICD-10-CM

## 2022-08-13 DIAGNOSIS — I739 Peripheral vascular disease, unspecified: Secondary | ICD-10-CM

## 2022-08-13 DIAGNOSIS — I6529 Occlusion and stenosis of unspecified carotid artery: Secondary | ICD-10-CM

## 2022-08-13 DIAGNOSIS — I25810 Atherosclerosis of coronary artery bypass graft(s) without angina pectoris: Secondary | ICD-10-CM

## 2022-08-14 ENCOUNTER — Encounter (INDEPENDENT_AMBULATORY_CARE_PROVIDER_SITE_OTHER): Payer: Self-pay | Admitting: Vascular Surgery

## 2022-08-18 ENCOUNTER — Emergency Department: Payer: Medicare Other

## 2022-08-18 ENCOUNTER — Observation Stay: Payer: Medicare Other

## 2022-08-18 ENCOUNTER — Inpatient Hospital Stay
Admission: EM | Admit: 2022-08-18 | Discharge: 2022-08-24 | DRG: 064 | Disposition: A | Discharge status: Skilled Nursing Facility | Payer: Medicare Other | Attending: Student | Admitting: Student

## 2022-08-18 DIAGNOSIS — Z823 Family history of stroke: Secondary | ICD-10-CM

## 2022-08-18 DIAGNOSIS — I63532 Cerebral infarction due to unspecified occlusion or stenosis of left posterior cerebral artery: Principal | ICD-10-CM | POA: Diagnosis present

## 2022-08-18 DIAGNOSIS — Z951 Presence of aortocoronary bypass graft: Secondary | ICD-10-CM

## 2022-08-18 DIAGNOSIS — Z602 Problems related to living alone: Secondary | ICD-10-CM | POA: Diagnosis present

## 2022-08-18 DIAGNOSIS — Z833 Family history of diabetes mellitus: Secondary | ICD-10-CM

## 2022-08-18 DIAGNOSIS — E063 Autoimmune thyroiditis: Secondary | ICD-10-CM

## 2022-08-18 DIAGNOSIS — Z9884 Bariatric surgery status: Secondary | ICD-10-CM

## 2022-08-18 DIAGNOSIS — E038 Other specified hypothyroidism: Secondary | ICD-10-CM

## 2022-08-18 DIAGNOSIS — Z79890 Hormone replacement therapy (postmenopausal): Secondary | ICD-10-CM

## 2022-08-18 DIAGNOSIS — R54 Age-related physical debility: Secondary | ICD-10-CM | POA: Diagnosis present

## 2022-08-18 DIAGNOSIS — Z886 Allergy status to analgesic agent status: Secondary | ICD-10-CM

## 2022-08-18 DIAGNOSIS — E785 Hyperlipidemia, unspecified: Secondary | ICD-10-CM | POA: Diagnosis present

## 2022-08-18 DIAGNOSIS — I639 Cerebral infarction, unspecified: Secondary | ICD-10-CM | POA: Diagnosis not present

## 2022-08-18 DIAGNOSIS — E119 Type 2 diabetes mellitus without complications: Secondary | ICD-10-CM

## 2022-08-18 DIAGNOSIS — I5020 Unspecified systolic (congestive) heart failure: Secondary | ICD-10-CM

## 2022-08-18 DIAGNOSIS — Z885 Allergy status to narcotic agent status: Secondary | ICD-10-CM

## 2022-08-18 DIAGNOSIS — E876 Hypokalemia: Secondary | ICD-10-CM | POA: Diagnosis not present

## 2022-08-18 DIAGNOSIS — Z515 Encounter for palliative care: Secondary | ICD-10-CM

## 2022-08-18 DIAGNOSIS — I340 Nonrheumatic mitral (valve) insufficiency: Secondary | ICD-10-CM | POA: Diagnosis present

## 2022-08-18 DIAGNOSIS — I10 Essential (primary) hypertension: Secondary | ICD-10-CM

## 2022-08-18 DIAGNOSIS — Z7902 Long term (current) use of antithrombotics/antiplatelets: Secondary | ICD-10-CM

## 2022-08-18 DIAGNOSIS — E440 Moderate protein-calorie malnutrition: Secondary | ICD-10-CM | POA: Insufficient documentation

## 2022-08-18 DIAGNOSIS — H5462 Unqualified visual loss, left eye, normal vision right eye: Secondary | ICD-10-CM | POA: Diagnosis present

## 2022-08-18 DIAGNOSIS — N184 Chronic kidney disease, stage 4 (severe): Secondary | ICD-10-CM | POA: Diagnosis not present

## 2022-08-18 DIAGNOSIS — I6522 Occlusion and stenosis of left carotid artery: Secondary | ICD-10-CM | POA: Diagnosis present

## 2022-08-18 DIAGNOSIS — E1151 Type 2 diabetes mellitus with diabetic peripheral angiopathy without gangrene: Secondary | ICD-10-CM | POA: Diagnosis present

## 2022-08-18 DIAGNOSIS — Z888 Allergy status to other drugs, medicaments and biological substances status: Secondary | ICD-10-CM

## 2022-08-18 DIAGNOSIS — Z8673 Personal history of transient ischemic attack (TIA), and cerebral infarction without residual deficits: Secondary | ICD-10-CM

## 2022-08-18 DIAGNOSIS — I252 Old myocardial infarction: Secondary | ICD-10-CM

## 2022-08-18 DIAGNOSIS — E1122 Type 2 diabetes mellitus with diabetic chronic kidney disease: Secondary | ICD-10-CM | POA: Diagnosis present

## 2022-08-18 DIAGNOSIS — D631 Anemia in chronic kidney disease: Secondary | ICD-10-CM | POA: Diagnosis present

## 2022-08-18 DIAGNOSIS — I5022 Chronic systolic (congestive) heart failure: Secondary | ICD-10-CM | POA: Diagnosis present

## 2022-08-18 DIAGNOSIS — I130 Hypertensive heart and chronic kidney disease with heart failure and stage 1 through stage 4 chronic kidney disease, or unspecified chronic kidney disease: Secondary | ICD-10-CM | POA: Diagnosis present

## 2022-08-18 DIAGNOSIS — I2510 Atherosclerotic heart disease of native coronary artery without angina pectoris: Secondary | ICD-10-CM | POA: Diagnosis present

## 2022-08-18 DIAGNOSIS — Z7982 Long term (current) use of aspirin: Secondary | ICD-10-CM

## 2022-08-18 DIAGNOSIS — Z79899 Other long term (current) drug therapy: Secondary | ICD-10-CM

## 2022-08-18 DIAGNOSIS — R4701 Aphasia: Secondary | ICD-10-CM | POA: Diagnosis present

## 2022-08-18 DIAGNOSIS — Z6827 Body mass index (BMI) 27.0-27.9, adult: Secondary | ICD-10-CM

## 2022-08-18 DIAGNOSIS — E039 Hypothyroidism, unspecified: Secondary | ICD-10-CM | POA: Diagnosis present

## 2022-08-18 DIAGNOSIS — Z8249 Family history of ischemic heart disease and other diseases of the circulatory system: Secondary | ICD-10-CM

## 2022-08-18 DIAGNOSIS — G936 Cerebral edema: Secondary | ICD-10-CM | POA: Diagnosis present

## 2022-08-18 DIAGNOSIS — Z66 Do not resuscitate: Secondary | ICD-10-CM | POA: Diagnosis not present

## 2022-08-18 DIAGNOSIS — E872 Acidosis, unspecified: Secondary | ICD-10-CM | POA: Diagnosis present

## 2022-08-18 DIAGNOSIS — Z8711 Personal history of peptic ulcer disease: Secondary | ICD-10-CM

## 2022-08-18 DIAGNOSIS — Z96652 Presence of left artificial knee joint: Secondary | ICD-10-CM | POA: Diagnosis present

## 2022-08-18 DIAGNOSIS — Z90710 Acquired absence of both cervix and uterus: Secondary | ICD-10-CM

## 2022-08-18 DIAGNOSIS — I422 Other hypertrophic cardiomyopathy: Secondary | ICD-10-CM | POA: Diagnosis present

## 2022-08-18 LAB — COMPREHENSIVE METABOLIC PANEL
ALT: 11 U/L (ref 0–44)
AST: 18 U/L (ref 15–41)
Albumin: 3.9 g/dL (ref 3.5–5.0)
Alkaline Phosphatase: 126 U/L (ref 38–126)
Anion gap: 9 (ref 5–15)
BUN: 38 mg/dL — ABNORMAL HIGH (ref 8–23)
CO2: 20 mmol/L — ABNORMAL LOW (ref 22–32)
Calcium: 8.6 mg/dL — ABNORMAL LOW (ref 8.9–10.3)
Chloride: 109 mmol/L (ref 98–111)
Creatinine, Ser: 2 mg/dL — ABNORMAL HIGH (ref 0.44–1.00)
GFR, Estimated: 26 mL/min — ABNORMAL LOW (ref >60)
Glucose, Bld: 219 mg/dL — ABNORMAL HIGH (ref 70–99)
Potassium: 4.2 mmol/L (ref 3.5–5.1)
Sodium: 138 mmol/L (ref 135–145)
Total Bilirubin: 0.8 mg/dL (ref 0.3–1.2)
Total Protein: 6.7 g/dL (ref 6.5–8.1)

## 2022-08-18 LAB — DIFFERENTIAL
Abs Immature Granulocytes: 0.02 10*3/uL (ref 0.00–0.07)
Basophils Absolute: 0 10*3/uL (ref 0.0–0.1)
Basophils Relative: 0 %
Eosinophils Absolute: 0 10*3/uL (ref 0.0–0.5)
Eosinophils Relative: 0 %
Immature Granulocytes: 0 %
Lymphocytes Relative: 15 %
Lymphs Abs: 1 10*3/uL (ref 0.7–4.0)
Monocytes Absolute: 0.5 10*3/uL (ref 0.1–1.0)
Monocytes Relative: 7 %
Neutro Abs: 5.3 10*3/uL (ref 1.7–7.7)
Neutrophils Relative %: 78 %

## 2022-08-18 LAB — CBC
HCT: 32.9 % — ABNORMAL LOW (ref 36.0–46.0)
Hemoglobin: 9.3 g/dL — ABNORMAL LOW (ref 12.0–15.0)
MCH: 23.9 pg — ABNORMAL LOW (ref 26.0–34.0)
MCHC: 28.3 g/dL — ABNORMAL LOW (ref 30.0–36.0)
MCV: 84.6 fL (ref 80.0–100.0)
Platelets: 301 10*3/uL (ref 150–400)
RBC: 3.89 MIL/uL (ref 3.87–5.11)
RDW: 19.3 % — ABNORMAL HIGH (ref 11.5–15.5)
WBC: 6.9 10*3/uL (ref 4.0–10.5)
nRBC: 0 % (ref 0.0–0.2)

## 2022-08-18 LAB — CBG MONITORING, ED
Glucose-Capillary: 195 mg/dL — ABNORMAL HIGH (ref 70–99)
Glucose-Capillary: 167 mg/dL — ABNORMAL HIGH (ref 70–99)

## 2022-08-18 LAB — PROTIME-INR
INR: 1.2 (ref 0.8–1.2)
Prothrombin Time: 14.6 seconds (ref 11.4–15.2)

## 2022-08-18 LAB — APTT: aPTT: 29 seconds (ref 24–36)

## 2022-08-18 LAB — ETHANOL: Alcohol, Ethyl (B): <10 mg/dL (ref <10)

## 2022-08-18 MED ORDER — CLOPIDOGREL BISULFATE 75 MG PO TABS
75.0000 mg | ORAL_TABLET | Freq: Every day | ORAL | Status: DC
  Administered 2022-08-18 – 2022-08-24 (×7): 75 mg via ORAL
  Filled 2022-08-18 (×7): qty 1

## 2022-08-18 MED ORDER — STROKE: EARLY STAGES OF RECOVERY BOOK: Freq: Once | Status: AC

## 2022-08-18 MED ORDER — ENOXAPARIN SODIUM 30 MG/0.3ML IJ SOSY
30.0000 mg | PREFILLED_SYRINGE | Freq: Every day | INTRAMUSCULAR | Status: DC
  Administered 2022-08-18 – 2022-08-21 (×4): 30 mg via SUBCUTANEOUS
  Filled 2022-08-18 (×4): qty 0.3

## 2022-08-18 MED ORDER — ALLOPURINOL 100 MG PO TABS
100.0000 mg | ORAL_TABLET | Freq: Every day | ORAL | Status: DC
  Administered 2022-08-19 – 2022-08-24 (×6): 100 mg via ORAL
  Filled 2022-08-18 (×6): qty 1

## 2022-08-18 MED ORDER — SENNOSIDES-DOCUSATE SODIUM 8.6-50 MG PO TABS
1.0000 | ORAL_TABLET | Freq: Every evening | ORAL | Status: DC | PRN
  Administered 2022-08-22: 1 via ORAL
  Filled 2022-08-18: qty 1

## 2022-08-18 MED ORDER — TICAGRELOR 90 MG PO TABS: 90.0000 mg | ORAL_TABLET | Freq: Two times a day (BID) | ORAL | Status: DC

## 2022-08-18 MED ORDER — ACETAMINOPHEN 325 MG PO TABS
650.0000 mg | ORAL_TABLET | ORAL | Status: DC | PRN
  Administered 2022-08-19 (×2): 650 mg via ORAL
  Filled 2022-08-18 (×2): qty 2

## 2022-08-18 MED ORDER — SODIUM CHLORIDE 0.9 % IV BOLUS
500.0000 mL | Freq: Once | INTRAVENOUS | Status: AC
  Administered 2022-08-18: 500 mL via INTRAVENOUS

## 2022-08-18 MED ORDER — CALCITRIOL 0.25 MCG PO CAPS
0.2500 ug | ORAL_CAPSULE | Freq: Every day | ORAL | Status: DC
  Administered 2022-08-19 – 2022-08-24 (×6): 0.25 ug via ORAL
  Filled 2022-08-18 (×6): qty 1

## 2022-08-18 MED ORDER — INSULIN ASPART 100 UNIT/ML IJ SOLN
0.0000 [IU] | Freq: Three times a day (TID) | INTRAMUSCULAR | Status: DC
  Administered 2022-08-19 (×2): 2 [IU] via SUBCUTANEOUS
  Administered 2022-08-19: 5 [IU] via SUBCUTANEOUS
  Administered 2022-08-20: 3 [IU] via SUBCUTANEOUS
  Administered 2022-08-20: 5 [IU] via SUBCUTANEOUS
  Administered 2022-08-20: 2 [IU] via SUBCUTANEOUS
  Administered 2022-08-21 – 2022-08-22 (×4): 3 [IU] via SUBCUTANEOUS
  Administered 2022-08-22: 8 [IU] via SUBCUTANEOUS
  Administered 2022-08-23 (×3): 3 [IU] via SUBCUTANEOUS
  Administered 2022-08-24: 11 [IU] via SUBCUTANEOUS
  Administered 2022-08-24: 5 [IU] via SUBCUTANEOUS
  Filled 2022-08-18 (×16): qty 1

## 2022-08-18 MED ORDER — ACETAMINOPHEN 650 MG RE SUPP: 650.0000 mg | RECTAL | Status: DC | PRN

## 2022-08-18 MED ORDER — PANTOPRAZOLE SODIUM 20 MG PO TBEC
20.0000 mg | DELAYED_RELEASE_TABLET | Freq: Every day | ORAL | Status: DC
  Administered 2022-08-19 – 2022-08-24 (×6): 20 mg via ORAL
  Filled 2022-08-18 (×6): qty 1

## 2022-08-18 MED ORDER — LEVOTHYROXINE SODIUM 100 MCG PO TABS
200.0000 ug | ORAL_TABLET | ORAL | Status: DC
  Administered 2022-08-19 – 2022-08-24 (×3): 200 ug via ORAL
  Filled 2022-08-18 (×3): qty 2

## 2022-08-18 MED ORDER — ASPIRIN 81 MG PO TBEC
81.0000 mg | DELAYED_RELEASE_TABLET | Freq: Every day | ORAL | Status: DC
  Administered 2022-08-19 – 2022-08-24 (×6): 81 mg via ORAL
  Filled 2022-08-18 (×6): qty 1

## 2022-08-18 MED ORDER — ATORVASTATIN CALCIUM 20 MG PO TABS
40.0000 mg | ORAL_TABLET | Freq: Every day | ORAL | Status: DC
  Administered 2022-08-18: 40 mg via ORAL
  Filled 2022-08-18: qty 2

## 2022-08-18 MED ORDER — ACETAMINOPHEN 160 MG/5ML PO SOLN: 650.0000 mg | ORAL | Status: DC | PRN

## 2022-08-18 NOTE — ED Triage Notes (Signed)
Pt presents to the ED due to aphasia that started today this morning per patient. Pt's last normal was yesterday by friend via phone. Pt states she started having a headache on Sunday. Pt lives alone. Excessive aphasia is noted in triage. Pt acknowledges that she is aware the words sound like rumbling. Pt NAD

## 2022-08-18 NOTE — ED Notes (Addendum)
Pt put on cardiac monitor by this rn and given warm blankets

## 2022-08-18 NOTE — Assessment & Plan Note (Signed)
-   Hold home antihypertensives in the setting of permissive hypertension 

## 2022-08-18 NOTE — Assessment & Plan Note (Signed)
Continue home levothyroxine 

## 2022-08-18 NOTE — ED Provider Notes (Signed)
[facility] Provider Note    Event Date/Time   First MD Initiated Contact with Patient 08/18/22 1623     (approximate)   History   Chief Complaint Aphasia   HPI  [name] is a 72 y.o. female with past medical history of hypertension, hyperlipidemia, diabetes, CKD, stroke, and PAD who presents to the ED for aphasia.  History is limited due to patient's mild confusion and majority of history is obtained from friend at bedside.  Friend states that she had spoken to the patient on the phone around 1 PM yesterday, when she seemed to be speaking normally.  Patient states that she began to "feel weird" sometime yesterday, believes it was before lunchtime, but is not exactly sure what time.  When the friend went to check on her earlier today, she found the patient slurring her speech with some word finding difficulties, also seemed very unsteady on her feet.  Patient was brought to her PCPs office, subsequently referred to the ED for further evaluation for possible stroke.      Physical Exam   Triage Vital Signs: ED Triage Vitals  Enc Vitals Group     BP 08/18/22 1528 137/68     Pulse Rate 08/18/22 1528 70     Resp 08/18/22 1528 18     Temp --      Temp src --      SpO2 08/18/22 1528 100 %     Weight 08/18/22 1529 169 lb (76.7 kg)     Height 08/18/22 1529 '5\' 6"'$  (1.676 m)     Head Circumference --      Peak Flow --      Pain Score 08/18/22 1528 0     Pain Loc --      Pain Edu? --      Excl. in [place]? --     Most recent vital signs: Vitals:   08/18/22 1528  BP: 137/68  Pulse: 70  Resp: 18  SpO2: 100%    Constitutional: Alert and oriented. Eyes: Conjunctivae are normal. Head: Atraumatic. Nose: No congestion/rhinnorhea. Mouth/Throat: Mucous membranes are moist.  Cardiovascular: Normal rate, regular rhythm. Grossly normal heart sounds.  2+ radial pulses bilaterally. Respiratory: Normal respiratory effort.  No retractions. Lungs  CTAB. Gastrointestinal: Soft and nontender. No distention. Musculoskeletal: No lower extremity tenderness nor edema.  Neurologic: Word finding difficulties noted, no slurred speech. No facial droop or extremity weakness noted.    ED Results / Procedures / Treatments   Labs (all labs ordered are listed, but only abnormal results are displayed) Labs Reviewed  CBC - Abnormal; Notable for the following components:      Result Value   Hemoglobin 9.3 (*)    HCT 32.9 (*)    MCH 23.9 (*)    MCHC 28.3 (*)    RDW 19.3 (*)    All other components within normal limits  COMPREHENSIVE METABOLIC PANEL - Abnormal; Notable for the following components:   CO2 20 (*)    Glucose, Bld 219 (*)    BUN 38 (*)    Creatinine, Ser 2.00 (*)    Calcium 8.6 (*)    GFR, Estimated 26 (*)    All other components within normal limits  CBG MONITORING, ED - Abnormal; Notable for the following components:   Glucose-Capillary 195 (*)    All other components within normal limits  PROTIME-INR  APTT  DIFFERENTIAL  ETHANOL  CBG MONITORING, ED     EKG  ED ECG REPORT I, Blake Divine, the attending physician, personally viewed and interpreted this ECG.   Date: 08/18/2022  EKG Time: 15:48  Rate: 66  Rhythm: normal sinus rhythm  Axis: LAD  Intervals:first-degree A-V block  and nonspecific intraventricular conduction delay  ST&T Change: None  RADIOLOGY CT head reviewed and interpreted by me with left occipital stroke, no hemorrhage noted.  PROCEDURES:  Critical Care performed: No  Procedures   MEDICATIONS ORDERED IN ED: Medications   stroke: early stages of recovery book (has no administration in time range)  acetaminophen (TYLENOL) tablet 650 mg (has no administration in time range)    Or  acetaminophen (TYLENOL) 160 MG/5ML solution 650 mg (has no administration in time range)    Or  acetaminophen (TYLENOL) suppository 650 mg (has no administration in time range)  senna-docusate (Senokot-S)  tablet 1 tablet (has no administration in time range)  enoxaparin (LOVENOX) injection 30 mg (has no administration in time range)     IMPRESSION / MDM / ASSESSMENT AND PLAN / ED COURSE  I reviewed the triage vital signs and the nursing notes.                              72 y.o. female with past medical history of hypertension, hyperlipidemia, diabetes, stroke, CKD, and PAD who presents to the ED with slurred speech and unsteady gait, last known well around 1 PM yesterday.  Patient's presentation is most consistent with acute presentation with potential threat to life or bodily function.  Differential diagnosis includes, but is not limited to, stroke, TIA, hypoglycemia, electrolyte abnormality, intracranial hemorrhage, medication effect, UTI.  Patient nontoxic-appearing and in no acute distress, vital signs are unremarkable.  She appears to have word finding difficulties but no other focal neurologic deficits noted.  CT head obtained from triage and concerning for subacute left parieto-occipital stroke, no evidence of associated hemorrhage.  Given last known well time of 1 PM yesterday, patient is outside the window for acute intervention.  We will hold off on CT angiogram, especially given her GFR of less than 30.  Chronic kidney disease is stable compared to previous, anemia is also stable compared to previous and no acute electrolyte abnormality noted.  Coags are unremarkable, case discussed with Dr. Quinn Axe of neurology who agrees with plan for admission for further stroke workup with no intervention at this time.  Case discussed with hospitalist for admission.      FINAL CLINICAL IMPRESSION(S) / ED DIAGNOSES   Final diagnoses:  Cerebrovascular accident (CVA), unspecified mechanism ([place])  Aphasia     Rx / DC Orders   ED Discharge Orders     None        Note:  This document was prepared using Dragon voice recognition software and may include unintentional dictation errors.    Blake Divine, MD 08/18/22 [id number]

## 2022-08-18 NOTE — Assessment & Plan Note (Addendum)
Prior history of CVA with left-sided carotid artery disease.  Holter monitor negative for arrhythmia.  Patient underwent carotid angiography in December 2024 that demonstrated greater than 70% stenosis in the distal common carotid artery and greater than 70% stenosis at the origin of the left internal carotid artery.  Per medication reconciliation, patient's last refill of Brilinta was in early February 2024 with only 15-day supply.  Currently presenting with significant expressive aphasia and hemianopia, with CT evidence of a large left parietal occipital infarct  - Neurology consulted; appreciate their recommendations - MRI brain pending - Bilateral carotid duplex pending.  Avoiding angiography secondary to CKD Stage 4  - Telemetry monitoring - Allow for permissive HTN (systolic < [id number] and diastolic < 123456) - Continue home Aspirin - Per neurology, given patient has not taken Brilinita in an extended period, start Plavix - PT/OT/SLP

## 2022-08-18 NOTE — Assessment & Plan Note (Signed)
Renal function currently at baseline.  - Monitor renal function while admitted 

## 2022-08-18 NOTE — Assessment & Plan Note (Signed)
Patient has a history of HFrEF with most recent EF of 35-40 % with evidence of severe asymmetric LVH concerning for HCM, but no LVOT.  Last followed up with cardiology in 2021.  She is euvolemic on examination at this time.  Current GDMT includes losartan only.  - Holding home losartan and Lasix in the setting of permissive hypertension - Restart home Lasix if evidence of hypervolemia develops - Strongly recommend outpatient follow-up with cardiology to advance GDMT

## 2022-08-18 NOTE — H&P (Addendum)
History and Physical    Patient: [name] [id number] DOB: 25-Feb-1951 DOA: 08/18/2022 DOS: the patient was seen and examined on 08/18/2022 PCP: Kirk Ruths, MD  Patient coming from: Home  Chief Complaint:  Chief Complaint  Patient presents with   Aphasia   HPI: [name] is a 72 y.o. female with medical history significant of prior CVA on aspirin + Brilinta, hypertension, hyperlipidemia, hypothyroidism, HFrEF with HCM, CKD stage IV, type 2 diabetes, CAD, PAD, who presents to the ED due to difficulty speaking.  History limited due to aphasia.  [name] is unable to quantify when her symptoms began.  She is able to express difficulty with her speech and vision.  She describes seeing 2 different images from each eye and feels that they are not seeing together.  She denies any pain, difficulty breathing, focal weakness or lower extremity swelling.  She states aspirin was last taken this morning and she believes Brilinta was last taken either yesterday or the day before.  Per chart review, patient has a friend that checks on her daily.  Yesterday at approximately 1 PM, patient was able to speak without any difficulty.  When her friend called her today in the morning, patient was noted to have difficulty with her speech.  Due to this, EMS was called.  ED course: On arrival to the ED, patient was normotensive at 137/68 with heart rate of 70.  She was saturating at 100% on room air. Initial workup notable for WBC of 6.9, hemoglobin of 9.3, glucose of 219, bicarb 20, BUN 38, creatinine 2.0, with GFR of 26.  Alcohol level negative.  INR within normal limits at 1.2.  CT of the head was obtained that demonstrated a large region of parenchymal hypodensity in the left parietal occipital lobe consistent with acute or subacute infarction.  No hemorrhage or midline shift.  Neurology consulted.  TRH contacted for admission.  Review of Systems: As mentioned in the history of present  illness. All other systems reviewed and are negative.  Past Medical History:  Diagnosis Date   Acute CVA (cerebrovascular accident) ([place]) 12/18/2021   Acute gastric ulcer without hemorrhage or perforation    Acute on chronic systolic CHF (congestive heart failure), NYHA class 3 ([place]) 08/03/2019   Formatting of this note might be different from the original. Global ef 35%   Anemia    Anemia, iron deficiency 11/07/2013   Arthritis    CAD (coronary artery disease)    Cerebrovascular accident [facility])    CHF (congestive heart failure) ([place])    Chicken pox    Chronic kidney disease    Colitis    ? 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   Sleep apnea    Type 2 diabetes mellitus with diabetic peripheral angiopathy without gangrene, without long-term current use of insulin ([place])    Past Surgical History:  Procedure Laterality Date   ABDOMINAL HYSTERECTOMY  CAROTID ANGIOGRAPHY Left 07/21/2022   Procedure: CAROTID ANGIOGRAPHY;  Surgeon: Katha Cabal, MD;  Location: [place] [facility];  Service: Cardiovascular;  Laterality: Left;   CHOLECYSTECTOMY     COLONOSCOPY WITH PROPOFOL N/A 12/24/2014   Procedure: COLONOSCOPY WITH PROPOFOL;  Surgeon: Hulen Luster, MD;  Location: [facility] ENDOSCOPY;  Service: Gastroenterology;  Laterality: N/A;   CORONARY ARTERY BYPASS GRAFT     ESOPHAGOGASTRODUODENOSCOPY N/A 09/02/2017   Procedure: ESOPHAGOGASTRODUODENOSCOPY (EGD);  Surgeon: Virgel Manifold, MD;  Location: [facility]  ENDOSCOPY;  Service: Endoscopy;  Laterality: N/A;   ESOPHAGOGASTRODUODENOSCOPY (EGD) WITH PROPOFOL N/A 12/24/2014   Procedure: ESOPHAGOGASTRODUODENOSCOPY (EGD) WITH PROPOFOL;  Surgeon: Hulen Luster, MD;  Location[facility] ENDOSCOPY;  Service: Gastroenterology;  Laterality: N/A;   JOINT REPLACEMENT     LOWER EXTREMITY ANGIOGRAPHY Left 07/17/2019   Procedure: Lower Extremity Angiography;  Surgeon: Algernon Huxley, MD;  Location: [place] [facility];  Service: Cardiovascular;  Laterality: Left;   ROUX-EN-Y GASTRIC BYPASS     loss over 100 pounds   TONSILLECTOMY     TOTAL KNEE ARTHROPLASTY Left    Social History:  reports that she has never smoked. She has never used smokeless tobacco. She reports that she does not drink alcohol and does not use drugs.  Allergies  Allergen Reactions   Niacin Other (See Comments)    Heart Attack Other reaction(s): Other (see comments) Heart Attack   Tramadol     Other reaction(s): Other (see comments) Intolerance, makes woozy   Ultram [Tramadol Hcl]     Intolerance, makes woozy   Baclofen Other (See Comments)    Dizziness Other reaction(s): Other (see comments) Dizziness   Cardura [Doxazosin Mesylate] Other (See Comments)    Dizzy with passing out spells   Doxazosin     Other reaction(s): Other (see comments) Dizzy with passing out spells Dizzy with passing out spells   Morphine And Related Nausea And Vomiting and Nausea Only   Other Rash    Gold metal (nickel in the gold) Gold metal Gold metal (nickel in the gold)    Family History  Problem Relation Age of Onset   Coronary artery disease Other    Stroke Maternal Grandmother    Diabetes Paternal Grandmother        diabetic coma   Heart attack Father    Heart attack Mother    Heart attack Maternal Grandfather    Heart attack Paternal Grandfather    Breast cancer Neg Hx     Prior to Admission medications   Medication Sig Start Date End Date Taking? Authorizing Provider  allopurinol (ZYLOPRIM)  100 MG tablet Take 100 mg by mouth daily.    [provider]  aspirin EC 81 MG tablet Take 1 tablet (81 mg total) by mouth daily. 05/21/22   Fritzi Mandes, MD  atorvastatin (LIPITOR) 40 MG tablet Take 1 tablet (40 mg total) by mouth at bedtime. 05/21/22   Fritzi Mandes, MD  blood glucose meter kit and supplies KIT Dispense based on patient and insurance preference. Use up to four times daily as directed. 04/09/22   Ezekiel Slocumb, DO  cloNIDine (CATAPRES) 0.1 MG tablet Take 0.1 mg by mouth 2 (two) times daily. 09/03/21   [provider]  diphenhydrAMINE (BENADRYL) 50 MG capsule Take 50 mg by mouth at bedtime as needed for sleep (Takes Benadryl 50 mg with 1 tab Tylenol PM qHS as needed).    [provider]  furosemide (LASIX) 40 MG tablet Take 40  mg by mouth daily. 01/12/22   [provider]  levothyroxine (SYNTHROID) 200 MCG tablet Take 200 mcg by mouth as directed. Take 1 tablet (200 mcg total) by mouth every Monday, Wednesday, and Friday Take on an empty stomach with a glass of water at least 30-60 minutes before breakfast. 03/02/22 03/02/23  [provider]  losartan (COZAAR) 25 MG tablet Take 25 mg by mouth daily. 05/05/22 05/05/23  [provider]  Multiple Vitamin (MULTIVITAMIN WITH MINERALS) TABS tablet Take 1 tablet by mouth daily.    [provider]  pantoprazole (PROTONIX) 20 MG tablet Take 20 mg by mouth daily. 10/24/21   [provider]  potassium chloride (KLOR-CON) 10 MEQ tablet Take 20 mEq by mouth daily.    [provider]  ticagrelor (BRILINTA) 90 MG TABS tablet Take 1 tablet (90 mg total) by mouth 2 (two) times daily. 05/21/22   Fritzi Mandes, MD    Physical Exam: Vitals:   08/18/22 1528 08/18/22 1529  BP: 137/68   Pulse: 70   Resp: 18   SpO2: 100%   Weight:  76.7 kg  Height:  '5\' 6"'$  (1.676 m)   Physical Exam Vitals and nursing note reviewed.  Constitutional:      General: She is not in acute  distress.    Appearance: She is normal weight. She is not toxic-appearing.  HENT:     Head: Normocephalic and atraumatic.     Mouth/Throat:     Mouth: Mucous membranes are moist.     Pharynx: Oropharynx is clear.  Eyes:     Extraocular Movements: Extraocular movements intact.     Conjunctiva/sclera: Conjunctivae normal.     Pupils: Pupils are equal, round, and reactive to light.  Cardiovascular:     Rate and Rhythm: Normal rate and regular rhythm.     Heart sounds: No murmur heard.    No gallop.  Pulmonary:     Effort: Pulmonary effort is normal. No respiratory distress.     Breath sounds: Normal breath sounds. No wheezing, rhonchi or rales.  Abdominal:     General: Bowel sounds are normal.     Palpations: Abdomen is soft.  Musculoskeletal:     Right lower leg: No edema.     Left lower leg: No edema.  Skin:    General: Skin is warm and dry.  Neurological:     Mental Status: She is alert.     Comments:  No facial asymmetry or dysarthria.  Significant expressive aphasia especially when asked descriptive questions.  Able to answer yes/no questions appropriately. Visual field deficits noted, specifically right lateral field and left temporal field Sensation intact throughout 5 out of 5 strength throughout  Psychiatric:        Mood and Affect: Mood normal.        Behavior: Behavior normal.    Data Reviewed: CBC with WBC of 6.9, hemoglobin of 9.3, MCV of 84 and platelets of 301 CMP with sodium of 138, potassium 4.2, chloride 109, bicarb 20, glucose 219, BUN 38, creatinine 2.0, calcium 8.6, anion gap 9, AST 18, ALT 11, GFR 26 INR within normal limits at 1.2 PTT within normal limits at 29 Alcohol level negative  EKG personally reviewed.  Sinus rhythm with rate of 66.  Prolonged PR interval consistent with first-degree AV block.  No ST or T wave changes consistent with acute ischemia.  CT HEAD WO CONTRAST  Result Date: 08/18/2022 CLINICAL DATA:  Expressive aphasia.  Acute  neuro deficit. EXAM:  CT HEAD WITHOUT CONTRAST TECHNIQUE: Contiguous axial images were obtained from the base of the skull through the vertex without intravenous contrast. RADIATION DOSE REDUCTION: This exam was performed according to the departmental dose-optimization program which includes automated exposure control, adjustment of the mA and/or kV according to patient size and/or use of iterative reconstruction technique. COMPARISON:  None Available. FINDINGS: Brain: Large field of parenchymal hypodensity involving the white matter and cerebral cortex of the LEFT parietal lobe posteriorly and superiorly as well as the LEFT occipital lobe. Extension into the LEFT mesotemporal lobe additionally. Region of parietooccipital involvement measures approximately 7.0 x 4.5 4.6 cm (75 cc). No intracranial hemorrhage identified. No hyperdense vessel identified. No midline shift or mass effect.  No ventriculomegaly. Mild cortical atrophy noted. Minimal white matter microvascular disease. Vascular: No hyperdense vessel or unexpected calcification. Skull: Normal. Negative for fracture or focal lesion. Sinuses/Orbits: No acute finding. Other: None. IMPRESSION: 1. Large region of parenchymal hypodensity in the LEFT parietooccipital lobe consistent with acute or subacute infarction. 2. No intracranial hemorrhage. 3. No midline shift or mass effect. Findings conveyed toJessup MD on 08/18/2022  at16:05. Electronically Signed   By: Suzy Bouchard M.D.   On: 08/18/2022 16:07    There are no new results to review at this time.  Assessment and Plan:  Acute CVA (cerebrovascular accident) [facility]) Prior history of CVA with left-sided carotid artery disease.  Holter monitor negative for arrhythmia.  Patient underwent carotid angiography in December 2024 that demonstrated greater than 70% stenosis in the distal common carotid artery and greater than 70% stenosis at the origin of the left internal carotid artery.  Per medication  reconciliation, patient's last refill of Brilinta was in early February 2024 with only 15-day supply.  Currently presenting with significant expressive aphasia and hemianopia, with CT evidence of a large left parietal occipital infarct  - Neurology consulted; appreciate their recommendations - MRI brain pending - Bilateral carotid duplex pending.  Avoiding angiography secondary to CKD Stage 4  - Telemetry monitoring - Allow for permissive HTN (systolic < [id number] and diastolic < 123456) - Continue home Aspirin - Per neurology, given patient has not taken Brilinita in an extended period, start Plavix - PT/OT/SLP  HFrEF (heart failure with reduced ejection fraction) ([place]) Patient has a history of HFrEF with most recent EF of 35-40 % with evidence of severe asymmetric LVH concerning for HCM, but no LVOT.  Last followed up with cardiology in 2021.  She is euvolemic on examination at this time.  Current GDMT includes losartan only.  - Holding home losartan and Lasix in the setting of permissive hypertension - Restart home Lasix if evidence of hypervolemia develops - Strongly recommend outpatient follow-up with cardiology to advance GDMT  Type 2 diabetes mellitus without complication (HCC) Last 123456 approximately 1 month ago elevated at 7.8%.  Patient is not currently on any antiglycemic agents at home  - SSI, moderate  CKD (chronic kidney disease) stage 4, GFR 15-29 ml/min (HCC) Renal function currently at baseline.  -Monitor renal function while admitted  Hypothyroidism - Continue home levothyroxine  HYPERTENSION, BENIGN - Hold home antihypertensives in the setting of permissive hypertension  Advance Care Planning:   Code Status: Full Code.  When asked if patient would want full resuscitative efforts, she answered yes  Consults: Neurology  Family Communication: No family at bedside  Severity of Illness: The appropriate patient status for this patient is OBSERVATION. Observation status is  judged to be reasonable and necessary in order to provide  the required intensity of service to ensure the patient's safety. The patient's presenting symptoms, physical exam findings, and initial radiographic and laboratory data in the context of their medical condition is felt to place them at decreased risk for further clinical deterioration. Furthermore, it is anticipated that the patient will be medically stable for discharge from the hospital within 2 midnights of admission.   Author: Jose Persia, MD 08/18/2022 6:01 PM  For on call review [email or web address].

## 2022-08-18 NOTE — Assessment & Plan Note (Signed)
Last A1c approximately 1 month ago elevated at 7.8%.  Patient is not currently on any antiglycemic agents at home  - SSI, moderate

## 2022-08-18 NOTE — ED Notes (Signed)
Pt given phone to call her friend [name], pt denies other needs at this time.

## 2022-08-19 ENCOUNTER — Inpatient Hospital Stay (HOSPITAL_COMMUNITY)
Admit: 2022-08-19 | Discharge: 2022-08-19 | Disposition: A | Discharge status: Home / Self Care | Payer: Medicare Other | Attending: Student | Admitting: Student

## 2022-08-19 ENCOUNTER — Other Ambulatory Visit (HOSPITAL_COMMUNITY): Payer: Self-pay

## 2022-08-19 ENCOUNTER — Other Ambulatory Visit: Payer: Self-pay

## 2022-08-19 ENCOUNTER — Encounter: Payer: Self-pay | Admitting: Internal Medicine

## 2022-08-19 DIAGNOSIS — H53462 Homonymous bilateral field defects, left side: Secondary | ICD-10-CM

## 2022-08-19 DIAGNOSIS — R4701 Aphasia: Secondary | ICD-10-CM | POA: Diagnosis present

## 2022-08-19 DIAGNOSIS — Z66 Do not resuscitate: Secondary | ICD-10-CM | POA: Diagnosis not present

## 2022-08-19 DIAGNOSIS — R414 Neurologic neglect syndrome: Secondary | ICD-10-CM | POA: Diagnosis not present

## 2022-08-19 DIAGNOSIS — I130 Hypertensive heart and chronic kidney disease with heart failure and stage 1 through stage 4 chronic kidney disease, or unspecified chronic kidney disease: Secondary | ICD-10-CM | POA: Diagnosis present

## 2022-08-19 DIAGNOSIS — E785 Hyperlipidemia, unspecified: Secondary | ICD-10-CM | POA: Diagnosis present

## 2022-08-19 DIAGNOSIS — Z951 Presence of aortocoronary bypass graft: Secondary | ICD-10-CM | POA: Diagnosis not present

## 2022-08-19 DIAGNOSIS — I6389 Other cerebral infarction: Secondary | ICD-10-CM | POA: Diagnosis not present

## 2022-08-19 DIAGNOSIS — Z79899 Other long term (current) drug therapy: Secondary | ICD-10-CM | POA: Diagnosis not present

## 2022-08-19 DIAGNOSIS — Z7982 Long term (current) use of aspirin: Secondary | ICD-10-CM | POA: Diagnosis not present

## 2022-08-19 DIAGNOSIS — I5022 Chronic systolic (congestive) heart failure: Secondary | ICD-10-CM | POA: Diagnosis present

## 2022-08-19 DIAGNOSIS — I6522 Occlusion and stenosis of left carotid artery: Secondary | ICD-10-CM | POA: Diagnosis not present

## 2022-08-19 DIAGNOSIS — E876 Hypokalemia: Secondary | ICD-10-CM | POA: Diagnosis not present

## 2022-08-19 DIAGNOSIS — E872 Acidosis, unspecified: Secondary | ICD-10-CM | POA: Diagnosis present

## 2022-08-19 DIAGNOSIS — E039 Hypothyroidism, unspecified: Secondary | ICD-10-CM | POA: Diagnosis present

## 2022-08-19 DIAGNOSIS — I2510 Atherosclerotic heart disease of native coronary artery without angina pectoris: Secondary | ICD-10-CM | POA: Diagnosis present

## 2022-08-19 DIAGNOSIS — I63532 Cerebral infarction due to unspecified occlusion or stenosis of left posterior cerebral artery: Secondary | ICD-10-CM | POA: Diagnosis present

## 2022-08-19 DIAGNOSIS — D631 Anemia in chronic kidney disease: Secondary | ICD-10-CM | POA: Diagnosis present

## 2022-08-19 DIAGNOSIS — G936 Cerebral edema: Secondary | ICD-10-CM | POA: Diagnosis present

## 2022-08-19 DIAGNOSIS — E440 Moderate protein-calorie malnutrition: Secondary | ICD-10-CM | POA: Diagnosis present

## 2022-08-19 DIAGNOSIS — I340 Nonrheumatic mitral (valve) insufficiency: Secondary | ICD-10-CM | POA: Diagnosis present

## 2022-08-19 DIAGNOSIS — Z79890 Hormone replacement therapy (postmenopausal): Secondary | ICD-10-CM | POA: Diagnosis not present

## 2022-08-19 DIAGNOSIS — E1122 Type 2 diabetes mellitus with diabetic chronic kidney disease: Secondary | ICD-10-CM | POA: Diagnosis present

## 2022-08-19 DIAGNOSIS — N184 Chronic kidney disease, stage 4 (severe): Secondary | ICD-10-CM | POA: Diagnosis present

## 2022-08-19 DIAGNOSIS — Z7189 Other specified counseling: Secondary | ICD-10-CM | POA: Diagnosis not present

## 2022-08-19 DIAGNOSIS — Z515 Encounter for palliative care: Secondary | ICD-10-CM | POA: Diagnosis not present

## 2022-08-19 DIAGNOSIS — I422 Other hypertrophic cardiomyopathy: Secondary | ICD-10-CM | POA: Diagnosis present

## 2022-08-19 DIAGNOSIS — E1151 Type 2 diabetes mellitus with diabetic peripheral angiopathy without gangrene: Secondary | ICD-10-CM | POA: Diagnosis present

## 2022-08-19 DIAGNOSIS — I639 Cerebral infarction, unspecified: Secondary | ICD-10-CM | POA: Diagnosis not present

## 2022-08-19 LAB — VITAMIN D 25 HYDROXY (VIT D DEFICIENCY, FRACTURES): Vit D, 25-Hydroxy: 38.95 ng/mL (ref 30–100)

## 2022-08-19 LAB — GLUCOSE, CAPILLARY
Glucose-Capillary: 201 mg/dL — ABNORMAL HIGH (ref 70–99)
Glucose-Capillary: 148 mg/dL — ABNORMAL HIGH (ref 70–99)
Glucose-Capillary: 126 mg/dL — ABNORMAL HIGH (ref 70–99)
Glucose-Capillary: 118 mg/dL — ABNORMAL HIGH (ref 70–99)

## 2022-08-19 LAB — BASIC METABOLIC PANEL
Anion gap: 11 (ref 5–15)
BUN: 33 mg/dL — ABNORMAL HIGH (ref 8–23)
CO2: 21 mmol/L — ABNORMAL LOW (ref 22–32)
Calcium: 8.7 mg/dL — ABNORMAL LOW (ref 8.9–10.3)
Chloride: 112 mmol/L — ABNORMAL HIGH (ref 98–111)
Creatinine, Ser: 1.84 mg/dL — ABNORMAL HIGH (ref 0.44–1.00)
GFR, Estimated: 29 mL/min — ABNORMAL LOW (ref >60)
Glucose, Bld: 105 mg/dL — ABNORMAL HIGH (ref 70–99)
Potassium: 3.9 mmol/L (ref 3.5–5.1)
Sodium: 144 mmol/L (ref 135–145)

## 2022-08-19 LAB — FOLATE: Folate: 19.4 ng/mL (ref >5.9)

## 2022-08-19 LAB — VITAMIN B12: Vitamin B-12: 232 pg/mL (ref 180–914)

## 2022-08-19 LAB — IRON: Iron: 32 ug/dL (ref 28–170)

## 2022-08-19 LAB — MAGNESIUM: Magnesium: 2.3 mg/dL (ref 1.7–2.4)

## 2022-08-19 MED ORDER — ACETAMINOPHEN 160 MG/5ML PO SOLN: 650.0000 mg | ORAL | Status: DC | PRN

## 2022-08-19 MED ORDER — ATORVASTATIN CALCIUM 80 MG PO TABS
80.0000 mg | ORAL_TABLET | Freq: Every day | ORAL | Status: DC
  Administered 2022-08-19 – 2022-08-23 (×5): 80 mg via ORAL
  Filled 2022-08-19 (×5): qty 1

## 2022-08-19 MED ORDER — ACETAMINOPHEN 325 MG PO TABS
650.0000 mg | ORAL_TABLET | ORAL | Status: DC | PRN
  Administered 2022-08-20: 650 mg via ORAL
  Filled 2022-08-19: qty 2

## 2022-08-19 MED ORDER — CALCIUM CARBONATE ANTACID 500 MG PO CHEW
1.0000 | CHEWABLE_TABLET | Freq: Three times a day (TID) | ORAL | Status: DC
  Administered 2022-08-19 – 2022-08-24 (×16): 200 mg via ORAL
  Filled 2022-08-19 (×16): qty 1

## 2022-08-19 MED ORDER — ACETAMINOPHEN 650 MG RE SUPP: 650.0000 mg | RECTAL | Status: DC | PRN

## 2022-08-19 MED ORDER — ADULT MULTIVITAMIN W/MINERALS CH
1.0000 | ORAL_TABLET | Freq: Two times a day (BID) | ORAL | Status: DC
  Administered 2022-08-19 – 2022-08-24 (×11): 1 via ORAL
  Filled 2022-08-19 (×11): qty 1

## 2022-08-19 MED ORDER — KETOROLAC TROMETHAMINE 15 MG/ML IJ SOLN
15.0000 mg | Freq: Once | INTRAMUSCULAR | Status: AC
  Administered 2022-08-19: 15 mg via INTRAVENOUS
  Filled 2022-08-19: qty 1

## 2022-08-19 MED ORDER — OXYCODONE HCL 5 MG PO TABS
5.0000 mg | ORAL_TABLET | Freq: Four times a day (QID) | ORAL | Status: DC | PRN
  Administered 2022-08-19: 5 mg via ORAL
  Filled 2022-08-19 (×2): qty 1

## 2022-08-19 MED ORDER — ENSURE ENLIVE PO LIQD
237.0000 mL | Freq: Three times a day (TID) | ORAL | Status: DC
  Administered 2022-08-19 – 2022-08-21 (×4): 237 mL via ORAL

## 2022-08-19 NOTE — Evaluation (Signed)
Physical Therapy Evaluation Patient Details Name: [name] MRN: [id number] DOB: 07-25-1950 Today's Date: 08/19/2022  History of Present Illness  73 y.o. female with medical history significant of prior CVA on aspirin + Brilinta, hypertension, hyperlipidemia, hypothyroidism, HFrEF with HCM, CKD stage IV, type 2 diabetes, CAD, PAD, who presents to the ED due to difficulty speaking and acute vision changes. MRI significant for large acute L PCA infarct.  Clinical Impression  Patient sleeping soundly upon arrival to room, but awakens to light touch and voice.  Alert and oriented to self, location as hospital and general reason for admission; follows simple commands (but often requires therapist demonstration or hand-over-hand to facilitate multi-step actions or to change actions), pleasant and cooperative. Does continue to endorse headache (RN in to administer pain meds during session); FACES 4-5/10.  Patient with significant expressive aphasia noted during session (improved when given choices); but unable to consistently and effectively communicate basic wants/needs at this time.  Mild weakness to L UE with resistance testing; otherwise, no significant focal weakness appreciated throughout extremities.  Does require significant/maximal cuing for visual scanning and attention to environment to R of midline.  Currently requiring min assist for bed mobility; min assist for sit/stand, standing balance and bed/chair transfer with HHA.  Requires max verbal cuing for environmental scanning to locate recliner (to R of midline); demonstrates narrowed BOS, R lateral lean in static standing, does require UE support to maintain balance/safety with standing activities. Gait assessment deferred due to persistent reports of headache; will further assess next session as appropriate. Would benefit from skilled PT to address above deficits and promote optimal return to PLOF.; recommend transition to acute inpatient rehab  upon discharge for high-intensity, post-acute rehab services.      Recommendations for follow up therapy are one component of a multi-disciplinary discharge planning process, led by the attending physician.  Recommendations may be updated based on patient status, additional functional criteria and insurance authorization.  Follow Up Recommendations Acute inpatient rehab (3hours/day)      Assistance Recommended at Discharge Frequent or constant Supervision/Assistance  Patient can return home with the following  A lot of help with walking and/or transfers;A lot of help with bathing/dressing/bathroom    Equipment Recommendations    Recommendations for Other Services       Functional Status Assessment Patient has had a recent decline in their functional status and demonstrates the ability to make significant improvements in function in a reasonable and predictable amount of time.     Precautions / Restrictions Precautions Precautions: Fall Restrictions Weight Bearing Restrictions: No      Mobility  Bed Mobility Overal bed mobility: Needs Assistance Bed Mobility: Supine to Sit, Sit to Supine     Supine to sit: Min guard Sit to supine: Min guard        Transfers Overall transfer level: Needs assistance Equipment used: 1 person hand held assist Transfers: Sit to/from Stand, Bed to chair/wheelchair/BSC Sit to Stand: Min assist Stand pivot transfers: Min assist         General transfer comment: max verbal cuing for environmental scanning to locate recliner (to R of midline); R lateral lean in static standing, does require UE support to maintain balance/safety with standing activities    Ambulation/Gait               General Gait Details: patient politely declined due to generalized fatigue/headache; requesting return to bed until headache improved (nursing in to admin meds during session)  Stairs  Wheelchair Mobility    Modified Rankin  (Stroke Patients Only)       Balance Overall balance assessment: Needs assistance Sitting-balance support: No upper extremity supported, Feet supported Sitting balance-Leahy Scale: Good     Standing balance support: No upper extremity supported Standing balance-Leahy Scale: Poor                               Pertinent Vitals/Pain Pain Assessment Pain Assessment: Faces Faces Pain Scale: Hurts even more Pain Location: headache Pain Descriptors / Indicators: Aching, Discomfort Pain Intervention(s): Limited activity within patient's tolerance, Monitored during session, Repositioned    Home Living Family/patient expects to be discharged to:: Private residence Living Arrangements: Alone Available Help at Discharge: Friend(s);Available PRN/intermittently Type of Home: Apartment Home Access: Stairs to enter Entrance Stairs-Rails: None Entrance Stairs-Number of Steps: 3 Alternate Level Stairs-Number of Steps: flight Home Layout: Two level;Able to live on main level with bedroom/bathroom Home Equipment: [place] (2 wheels);Rollator (4 wheels);BSC/3in1 Additional Comments: information obtained from prior admission in December 2023 as pt having difficulty verbalizing due to expressive aphasia    Prior Function Prior Level of Function : Independent/Modified Independent;Driving             Mobility Comments: amb no AD, no recent fall history ADLs Comments: MOD I-I in ADL/IADL, cooks, cleans, drives, mows yard     Hand Dominance   Dominant Hand: Right    Extremity/Trunk Assessment   Upper Extremity Assessment Upper Extremity Assessment:  (R UE grossly 4+/5, L UE grossly 4-/5 throughout)    Lower Extremity Assessment Lower Extremity Assessment:  (grossly at least 4/5 throughout)       Communication   Communication: Expressive difficulties  Cognition Arousal/Alertness: Awake/alert Behavior During Therapy: Flat affect Overall Cognitive  Status: Difficult to assess                                 General Comments: Pt follows commands with min cuing for technique. Significant expressive aphasia and word finding difficulties but able to correctly answer when given choice of 2; able to effectively demonstrate use of objects. Unable to consistently communicate wants/needs        General Comments      Exercises     Assessment/Plan    PT Assessment Patient needs continued PT services  PT Problem List Decreased activity tolerance;Decreased balance;Decreased mobility;Decreased coordination;Decreased cognition;Decreased knowledge of use of DME;Decreased safety awareness;Decreased knowledge of precautions       PT Treatment Interventions DME instruction;Gait training;Stair training;Functional mobility training;Therapeutic activities;Therapeutic exercise;Balance training;Neuromuscular re-education;Cognitive remediation;Patient/family education    PT Goals (Current goals can be found in the Care Plan section)  Acute Rehab PT Goals Patient Stated Goal: to get back home PT Goal Formulation: With patient Time For Goal Achievement: 09/02/22 Potential to Achieve Goals: Good    Frequency 7X/week     Co-evaluation               AM-PAC PT "6 Clicks" Mobility  Outcome Measure Help needed turning from your back to your side while in a flat bed without using bedrails?: None Help needed moving from lying on your back to sitting on the side of a flat bed without using bedrails?: A Little Help needed moving to and from a bed to a chair (including a wheelchair)?: A Little Help needed standing up from a chair using  your arms (e.g., wheelchair or bedside chair)?: A Little Help needed to walk in hospital room?: A Lot Help needed climbing 3-5 steps with a railing? : A Lot 6 Click Score: 17    End of Session Equipment Utilized During Treatment: Gait belt Activity Tolerance:  (limited by headache) Patient left: in  bed;with bed alarm set;with call bell/phone within reach Nurse Communication: Mobility status PT Visit Diagnosis: Difficulty in walking, not elsewhere classified (R26.2);Hemiplegia and hemiparesis Hemiplegia - Right/Left: Right Hemiplegia - dominant/non-dominant: Dominant Hemiplegia - caused by: Cerebral infarction    Time: 1147-1205 PT Time Calculation (min) (ACUTE ONLY): 18 min   Charges:   PT Evaluation $PT Eval Moderate Complexity: 1 Mod         Kazuko Clemence H. Owens Shark, PT, DPT, NCS 08/19/22, 2:33 PM [phone]

## 2022-08-19 NOTE — TOC Benefit Eligibility Note (Signed)
Patient Teacher, English as a foreign language completed.    The patient is currently admitted and upon discharge could be taking Tradjenta 5 mg.  The current 30 day co-pay is $11.20.   The patient is currently admitted and upon discharge could be taking Jardiance 10 mg.  The current 30 day co-pay is $11.20.   The patient is insured through [employer] Patient Advocate Specialist [place] Patient Advocate Team Direct Number: [phone]  Fax: [phone]

## 2022-08-19 NOTE — TOC Benefit Eligibility Note (Signed)
Patient Teacher, English as a foreign language completed.    The patient is currently admitted and upon discharge could be taking Brilinta 90 mg.  The current 30 day co-pay is $11.20.   The patient is insured through [employer] [place] Patient Advocate Specialist [place] Patient Advocate Team Direct Number: [phone]  Fax: [phone]

## 2022-08-19 NOTE — Progress Notes (Signed)
Inpatient Rehab Admissions Coordinator :  Per therapy recommendations, patient was screened for CIR candidacy by Latiesha Harada RN MSN.  At this time patient appears to be a potential candidate for CIR. I will place a rehab consult per protocol for full assessment. Please call me with any questions.  Mckale Haffey RN MSN Admissions Coordinator [phone]   

## 2022-08-19 NOTE — Consult Note (Signed)
Hospital Consult    Reason for Consult:  Acute CVA with Left Carotid Artery disease.  Requesting Physician:  Dr Revonda Humphrey MD.  MRN #:  [id number]  History of Present Illness: This is a 72 y.o. female with medical history significant of prior CVA on aspirin + Brilinta, hypertension, hyperlipidemia, hypothyroidism, HFrEF with HCM, CKD stage IV, type 2 diabetes, CAD, PAD, who presents to the ED due to difficulty speaking.  History limited due to aphasia.   Patient lives alone and has no immediate family and therefore is cared for by family from the church in which she attends.  History was given to me this morning by the churches pastor who is at the bedside.  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  She contacted the patient on Sunday morning in which the patient had revealed she had a headache.  She had asked the patient to be seen immediately explaining to her this could be the start of a stroke.  Patient refused.  Caretaker then called again on Tuesday only to find that the patient was having garbled speech on the phone.  She then called 911 and brought the patient to [facility] emergency room.    Upon exam this morning the patient was restless in bed.  Therapy was at the bedside as well as nursing.  Patient had just ambulated with physical therapy to and nursing to the bathroom.  In questioning the patient she was unable to speak sensibly.  She has obvious aphasia without proper word recall.  Upon testing proprioception patient was unable to touch my finger and touch her nose.  Patient was unable to see or follow my finger as I extended to her right side.  Almost no peripheral vision noted.  Hard to quantify true blindness or just double vision due to her aphasia.  She appears to explain both.  Patient was experiencing some difficulty with dizziness/vertigo as physical therapy and myself tried to stand the patient.  Patient had to be put  back to bed in a lying position.  I think patient's extremities for strength she was +5 throughout.  No physical weakness noted.  Patient was last seen by Dr. Delana Meyer in clinic on 08/13/2022.  Patient at that time was taking 81 mg of aspirin daily and 90 mg of Brilinta twice a day.  Some question today upon coming to the hospital about patient continuing the Brilinta medication.  At this time she had no difficulties speaking and her vision was normal.  No other complaints overnight.  Her vitals all remained stable.     Past Medical History:  Diagnosis Date   Acute CVA (cerebrovascular accident) ([place]) 12/18/2021   Acute gastric ulcer without hemorrhage or perforation    Acute on chronic systolic CHF (congestive heart failure), NYHA class 3 ([place]) 08/03/2019   Formatting of this note might be different from the original. Global ef 35%   Anemia    Anemia, iron deficiency 11/07/2013   Arthritis    CAD (coronary artery disease)    Cerebrovascular accident [facility])    CHF (congestive heart failure) ([place])    Chicken pox    Chronic kidney disease    Colitis    ? 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   Sleep apnea    Type 2 diabetes mellitus with diabetic peripheral angiopathy without gangrene, without long-term current use of insulin [facility])     Past Surgical History:  Procedure Laterality Date   ABDOMINAL HYSTERECTOMY     CAROTID ANGIOGRAPHY Left 07/21/2022   Procedure: CAROTID ANGIOGRAPHY;  Surgeon: Katha Cabal, MD;  Location: [place] CV LAB;  Service: Cardiovascular;  Laterality: Left;   CHOLECYSTECTOMY     COLONOSCOPY WITH PROPOFOL N/A 12/24/2014   Procedure: COLONOSCOPY WITH PROPOFOL;  Surgeon: Hulen Luster, MD;  Location[facility] ENDOSCOPY;  Service: Gastroenterology;  Laterality: N/A;   CORONARY ARTERY BYPASS GRAFT     ESOPHAGOGASTRODUODENOSCOPY N/A 09/02/2017   Procedure: ESOPHAGOGASTRODUODENOSCOPY (EGD);  Surgeon: Virgel Manifold, MD;  [facility] ENDOS[facility];  Service: Endoscopy;  Laterality: N/A;   ESOPHAGOGASTRODUODENOSCOPY (EGD) WITH PROPOFOL N/A 12/24/2014   Procedure: ESOPHAGOGASTRODUODENOSCOPY (EGD) WITH PROPOFOL;  Surgeon: Hulen Luster, MD;  Location: [facility] ENDOSCOPY;  Service: Gastroenterology;  Laterality: N/A;   JOINT REPLACEMENT     LOWER EXTREMITY ANGIOGRAPHY Left 07/17/2019   Procedure: Lower Extremity Angiography;  Surgeon: Algernon Huxley, MD;  Location: [place] CV LAB;  Service: Cardiovascular;  Laterality: Left;   ROUX-EN-Y GASTRIC BYPASS     loss over 100 pounds   TONSILLECTOMY     TOTAL KNEE ARTHROPLASTY Left     Allergies  Allergen Reactions   Niacin Other (See Comments)    Heart Attack Other reaction(s): Other (see comments) Heart Attack   Tramadol     Other reaction(s): Other (see comments) Intolerance, makes woozy   Ultram [Tramadol Hcl]     Intolerance, makes woozy   Baclofen Other (See Comments)    Dizziness Other reaction(s): Other (see comments) Dizziness   Cardura [Doxazosin Mesylate] Other (See Comments)    Dizzy with passing out spells   Doxazosin     Other reaction(s): Other (see comments) Dizzy with passing out spells Dizzy with passing out spells   Morphine And Related Nausea And Vomiting and Nausea Only   Other Rash    Gold metal (nickel in the gold) Gold metal Gold metal (nickel in the gold)    Prior to Admission medications   Medication Sig Start Date End Date Taking? Authorizing Provider  allopurinol (ZYLOPRIM) 100 MG tablet Take  100 mg by mouth daily.   Yes [provider]  aspirin EC 81 MG tablet Take 1 tablet (81 mg total) by mouth daily. 05/21/22  Yes Fritzi Mandes, MD  atorvastatin (LIPITOR) 40 MG tablet Take 1 tablet (40 mg total) by mouth at bedtime. 05/21/22  Yes Fritzi Mandes, MD  calcitRIOL (ROCALTROL) 0.25 MCG capsule Take 0.25 mcg by mouth daily.   Yes [provider]  cloNIDine (CATAPRES) 0.1 MG tablet Take 0.1 mg by mouth 2 (two) times daily.   Yes [provider]  diphenhydrAMINE (BENADRYL) 50 MG capsule Take 50 mg by mouth at bedtime as needed for sleep.   Yes [provider]  furosemide (LASIX) 40 MG tablet Take 40 mg by mouth daily.   Yes [provider]  levothyroxine (SYNTHROID) 200  MCG tablet Take 200 mcg by mouth every Monday, Wednesday, and Friday.   Yes [provider]  losartan (COZAAR) 25 MG tablet Take 25 mg by mouth daily.   Yes [provider]  Multiple Vitamin (MULTIVITAMIN WITH MINERALS) TABS tablet Take 1 tablet by mouth daily.   Yes [provider]  pantoprazole (PROTONIX) 20 MG tablet Take 20 mg by mouth daily.   Yes [provider]  potassium chloride (KLOR-CON) 10 MEQ tablet Take 20 mEq by mouth daily.   Yes [provider]  ticagrelor (BRILINTA) 90 MG TABS tablet Take 1 tablet (90 mg total) by mouth 2 (two) times daily. 05/21/22  Yes Fritzi Mandes, MD  blood glucose meter kit and supplies KIT Dispense based on patient and insurance preference. Use up to four times daily as directed. 04/09/22   Ezekiel Slocumb, DO    Social History   Socioeconomic History   Marital status: Single    Spouse name: Not on file   Number of children: 0   Years of education: Not on file   Highest education level: Not on file  Occupational History   Occupation: customer service    Employer: [employer]  Tobacco Use   Smoking status: Never   Smokeless tobacco: Never  Vaping Use   Vaping Use: Never used  Substance and  Sexual Activity   Alcohol use: No   Drug use: No   Sexual activity: Not on file  Other Topics Concern   Not on file  Social History Narrative   Single, no children   Customer service   1 caffeine/day   Social Determinants of Health   Financial Resource Strain: Not on file  Food Insecurity: No Food Insecurity (08/19/2022)   Hunger Vital Sign    Worried About Running Out of Food in the Last Year: Never true    Ran Out of Food in the Last Year: Never true  Transportation Needs: No Transportation Needs (08/19/2022)   PRAPARE - Hydrologist (Medical): No    Lack of Transportation (Non-Medical): No  Physical Activity: Not on file  Stress: Not on file  Social Connections: Not on file  Intimate Partner Violence: Not At Risk (08/19/2022)   Humiliation, Afraid, Rape, and Kick questionnaire    Fear of Current or Ex-Partner: No    Emotionally Abused: No    Physically Abused: No    Sexually Abused: No     Family History  Problem Relation Age of Onset   Coronary artery disease Other    Stroke Maternal Grandmother    Diabetes Paternal Grandmother        diabetic coma   Heart attack Father    Heart attack Mother    Heart attack Maternal Grandfather    Heart attack Paternal Grandfather    Breast cancer Neg Hx     ROS: Otherwise negative unless mentioned in HPI  Physical Examination  Vitals:   08/19/22 0441 08/19/22 0759  BP: (!) 170/83 (!) 179/83  Pulse: 73 75  Resp: 18 17  Temp: 98 F (36.7 C) 98.1 F (36.7 C)  SpO2: 98% 98%   Body mass index is 27.28 kg/m.  General:  WDWN in NAD Gait: Not observed HENT: WNL, normocephalic Pulmonary: normal non-labored breathing, without Rales, rhonchi,  wheezing Cardiac: regular, without  Murmurs, rubs or gallops; without carotid bruits Abdomen: Positive bowel sounds, soft, NT/ND, no masses Skin: without rashes Vascular Exam/Pulses: Positive palpable pulses to Radial, PT and DP.  Extremities: without  ischemic changes, without Gangrene , without cellulitis; without open wounds;  Musculoskeletal: no muscle wasting or atrophy  Neurologic: A&O X 1;  No focal weakness or paresthesias are detected; speech is garbled with word disassociation. Patient appears to have lost medial left vision and lateral with peripheral right vision. Patient was dizzy and unable to stand.  Psychiatric:  The pt has Abnormal- CVA with Aphasia.   Flat affect. Lymph:  Unremarkable  CBC    Component Value Date/Time   WBC 6.9 08/18/2022 1541   RBC 3.89 08/18/2022 1541   HGB 9.3 (L) 08/18/2022 1541   HGB 8.3 (L) 02/05/2012 0833   HCT 32.9 (L) 08/18/2022 1541   HCT 26.8 (L) 01/25/2012 0837   PLT 301 08/18/2022 1541   PLT 227 02/03/2012 0426   MCV 84.6 08/18/2022 1541   MCV 70 (L) 01/25/2012 0837   MCH 23.9 (L) 08/18/2022 1541   MCHC 28.3 (L) 08/18/2022 1541   RDW 19.3 (H) 08/18/2022 1541   RDW 17.8 (H) 01/25/2012 0837   LYMPHSABS 1.0 08/18/2022 1541   MONOABS 0.5 08/18/2022 1541   EOSABS 0.0 08/18/2022 1541   BASOSABS 0.0 08/18/2022 1541    BMET    Component Value Date/Time   NA 138 08/18/2022 1541   NA 144 02/03/2012 0426   K 4.2 08/18/2022 1541   K 4.4 02/03/2012 0426   CL 109 08/18/2022 1541   CL 113 (H) 02/03/2012 0426   CO2 20 (L) 08/18/2022 1541   CO2 24 02/03/2012 0426   GLUCOSE 219 (H) 08/18/2022 1541   GLUCOSE 153 (H) 02/03/2012 0426   BUN 38 (H) 08/18/2022 1541   BUN 22 (H) 02/03/2012 0426   CREATININE 2.00 (H) 08/18/2022 1541   CREATININE 1.76 (H) 08/18/2013 1019   CALCIUM 8.6 (L) 08/18/2022 1541   CALCIUM 8.1 (L) 02/03/2012 0426   GFRNONAA 26 (L) 08/18/2022 1541   GFRNONAA 30 (L) 08/18/2013 1019   GFRAA 31 (L) 07/19/2019 0211   GFRAA 35 (L) 08/18/2013 1019    COAGS: Lab Results  Component Value Date   INR 1.2 08/18/2022   INR 1.1 05/19/2022   INR 1.2 12/17/2021     Non-Invasive Vascular Imaging:   EXAM: CT HEAD WITHOUT CONTRAST  IMPRESSION: 1. Large region of  parenchymal hypodensity in the LEFT parietooccipital lobe consistent with acute or subacute infarction. 2. No intracranial hemorrhage. 3. No midline shift or mass effect.  EXAM: MRI HEAD WITHOUT CONTRAST  IMPRESSION: 1. Large acute infarct of the left PCA territory. No acute hemorrhage or mass effect. 2. Abnormal left ICA flow void, consistent with occlusion or severe stenosis. 3. Multiple old small vessel infarcts of the deep white matter and cerebellum.  Statin:  Yes.   Beta Blocker:  No. Aspirin:  Yes.   ACEI:  No. ARB:  Yes.   CCB use:  No Other antiplatelets/anticoagulants:  Yes.   Brillinta 90 mg BID    ASSESSMENT/PLAN: This is a 72 y.o. female who presents to [facility] emergency room with an acute CVA.  Her history dictates started on Sunday and progressed her yesterday afternoon with patient becoming blind in left medial vision and right peripheral vision.  Also has aphasia with word disassociation.  On MRI it appears she has a large acute infarct of the left PCA territory of the brain without hemorrhage or mass effect.  Is also an abnormal left internal carotid narrowing/stenosis.  Or multiple old small vessel infarcts of the deep white matter in the cerebellum  noted.  At this time vascular surgery is not planning any interventions.  Patient is at high risk for reperfusion hemorrhage with correcting left carotid stenosis.  We can reevaluate patient in 2 weeks for possible intervention of her carotid stenosis.  -This patient's case was discussed in detail with Dr. Hortencia Pilar MD and he is in agreement with the plan.   Drema Pry [facility] 08/19/2022 8:06 AM

## 2022-08-19 NOTE — Progress Notes (Signed)
Initial Nutrition Assessment  DOCUMENTATION CODES:   Non-severe (moderate) malnutrition in context of chronic illness  INTERVENTION:   -Liberalize diet to regular for wider variety of meal selections -MVI with minerals BID -500 mg calcium carbonate TID -Ensure Enlive po TID, each supplement provides 350 kcal and 20 grams of protein -Due to history of roux en y gastric bypass surgery and malnutrition, RD will draw and monitor labs to rule out possible micronutrient deficiencies: iron, folic acid, thiamine, copper, zinc, vitamin B-12, vitamin D, vitamin A, vitamin E, and vitamin K  NUTRITION DIAGNOSIS:   Moderate Malnutrition related to chronic illness (CHF) as evidenced by mild fat depletion, mild muscle depletion, moderate muscle depletion, percent weight loss.  GOAL:   Patient will meet greater than or equal to 90% of their needs  MONITOR:   PO intake, Supplement acceptance  REASON FOR ASSESSMENT:   Malnutrition Screening Tool    ASSESSMENT:   Pt with medical history significant of prior CVA on aspirin + Brilinta, hypertension, hyperlipidemia, hypothyroidism, HFrEF with HCM, CKD stage IV, type 2 diabetes, CAD, PAD, who presents due to difficulty speaking.  History limited due to aphasia.  Pt admitted with acute CVA.   Per MD notes, CT reveals large lt parietal occipital infarct.   Pt lying in bed at time of visit, aroused easily to voice. Pt able to answer close ended questions, but struggled with answering more complex questions secondary to word finding difficulty. Pt reports poor appetite and has not eaten anything to eat today. Pt shares that her mouth is dry; RD provided pt with some water for comfort.   Pt denies any difficulty chewing or swallowing foods. Pt reports decreased oral intake for a few weeks PTA. She consumes 3 meals per day at baseline, but has been eating significantly less. Pt shares that meals consists of "crackers and celery".   Per chart review, pt  has history of roux en y gastric bypass surgery in 2009. Pt able to confirm that she had this procedure done and takes her vitamins regularly, but unable to recall which vitamins she takes at home. RD has not seen any recent bariatric notes in [place].    Pt unsure if she has lost weight. Reviewed wt hx; pt has experienced a 10.1% wt loss over the past 3 months, which is significant for time frame. Concern over compliance of vitamins and bariatric follow-up; RD will obtain labs to rule out potential deficiencies.   Discussed importance of good meal and supplement intake to promote healing. Pt amenable to try supplements, stating she has drank them in the past, but unsure of brand.   Lab Results  Component Value Date   HGBA1C 6.8 (H) 05/20/2022   PTA DM medications are none.   Labs reviewed: Calcium: 8.6, CBGS: [id number] (inpatient orders for glycemic control are 0-15 units insulin aspart TID with meals).    NUTRITION - FOCUSED PHYSICAL EXAM:  Flowsheet Row Most Recent Value  Orbital Region No depletion  Upper Arm Region Moderate depletion  Thoracic and Lumbar Region No depletion  Buccal Region Mild depletion  Temple Region Mild depletion  Clavicle Bone Region No depletion  Clavicle and Acromion Bone Region No depletion  Scapular Bone Region No depletion  Dorsal Hand Moderate depletion  Patellar Region Mild depletion  Anterior Thigh Region Mild depletion  Posterior Calf Region Mild depletion  Edema (RD Assessment) None  Hair Reviewed  Eyes Reviewed  Mouth Reviewed  Skin Reviewed  Nails Reviewed  Diet Order:   Diet Order             Diet heart healthy/carb modified Room service appropriate? Yes; Fluid consistency: Thin  Diet effective now                   EDUCATION NEEDS:   Education needs have been addressed  Skin:  Skin Assessment: Reviewed RN Assessment  Last BM:  08/18/22  Height:   Ht Readings from Last 1 Encounters:  08/18/22 '5\' 6"'$  (1.676 m)     Weight:   Wt Readings from Last 1 Encounters:  08/18/22 76.7 kg    Ideal Body Weight:  59.1 kg  BMI:  Body mass index is 27.28 kg/m.  Estimated Nutritional Needs:   Kcal:  1850-2050  Protein:  90-105 grams  Fluid:  > 1.8 L    [name], RD, LDN, [place] Registered Dietitian II Certified Diabetes Care and Education Specialist Please refer to [facility] for RD and/or RD on-call/weekend/after hours pager

## 2022-08-19 NOTE — Progress Notes (Signed)
[facility] Progress Note  Patient: [name]    [id number]  DOA: 08/18/2022     Date of Service: the patient was seen and examined on 08/19/2022  Chief Complaint  Patient presents with   Aphasia   Brief hospital course: [name] is a 72 y.o. female with medical history significant of prior CVA on aspirin + Brilinta, hypertension, hyperlipidemia, hypothyroidism, HFrEF with HCM, CKD stage IV, type 2 diabetes, CAD, PAD, who presents to the ED due to difficulty speaking.  History limited due to aphasia.  Ms. Coxwell is unable to quantify when her symptoms began.  She is able to express difficulty with her speech and vision.  She describes seeing 2 different images from each eye and feels that they are not seeing together. Per chart review, patient has a friend that checks on her daily. On 3/11 at approximately 1 PM, patient was able to speak without any difficulty.  When her friend called her today in the morning, patient was noted to have difficulty with her speech.  Due to this, EMS was called.   ED course: On arrival to the ED, patient was normotensive at 137/68 with heart rate of 70.  She was saturating at 100% on room air. Initial workup notable for WBC of 6.9, hemoglobin of 9.3, glucose of 219, bicarb 20, BUN 38, creatinine 2.0, with GFR of 26.  Alcohol level negative.  INR within normal limits at 1.2.  CT of the head was obtained that demonstrated a large region of parenchymal hypodensity in the left parietal occipital lobe consistent with acute or subacute infarction.  No hemorrhage or midline shift.  Neurology consulted.  [facility] contacted for admission.    Assessment and Plan:  Acute CVA left PCA territory Prior history of CVA with left-sided carotid artery disease.  Holter monitor negative for arrhythmia.  Patient underwent carotid angiography in December 2024 that demonstrated greater than 70% stenosis in the distal common carotid artery and greater than 70% stenosis at the  origin of the left internal carotid artery.  Per medication reconciliation, patient's last refill of Brilinta was in early February 2024 with only 15-day supply.  CT evidence of a large left parietal occipital infarct  MRI brain:  Large acute infarct of the left PCA territory. No acute hemorrhage or mass effect. Abnormal left ICA flow void, consistent with occlusion or severe stenosis. Multiple old small vessel infarcts of the deep white matter and cerebellum. Telemetry monitoring Allow for permissive HTN (systolic < [id number] and diastolic < 123456) Continue Aspirin 81 mg pod, started Plavix 75 mg po daily and Lipitor 40 mg p.o. daily Continue neurocheck as per protocol PT/OT/SLP eval Neurology consulted F/u TTE   Left carotid stenosis Vascular surgery consulted, recommended no intervention at this time due to reperfusion hemorrhage Follow-up with vascular surgery in 2 weeks for possible intervention on her carotid stenosis.   Hypertension Allow permissive hypertension due to acute CVA Held home medications clonidine 0.1 mg twice daily, Lasix 40 mg p.o. daily, losartan 25 mg p.o. daily We will monitor BP and resume home meds as needed We will use IV hydralazine as needed if BP greater than 220/120  Hypothyroid, continue Synthroid  CKD stage IV, baseline creatinine is around 2 Continue to monitor renal functions and urine output Mild acidosis, bicarb 20, monitor   Diabetes mellitus type 2, recent A1c 6.9, well-controlled Currently patient is not on any medication, continue diabetic diet Follow A1c Continue NovoLog sliding scale, monitor CBG  Body mass index is 27.28 kg/m.  Nutrition Problem: Moderate Malnutrition Etiology: chronic illness (CHF) Interventions: Interventions: Ensure Enlive (each supplement provides 350kcal and 20 grams of protein), MVI, Liberalize Diet  Diet: Regular diet DVT Prophylaxis: Subcutaneous Lovenox   Advance goals of care discussion: Full code  Family  Communication: family was not present at bedside, at the time of interview.  The pt provided permission to discuss medical plan with the family. Opportunity was given to ask question and all questions were answered satisfactorily.   Disposition:  Pt is from Home, admitted with Acute CVA, still has diff in speech and vision loss, which precludes a safe discharge. Discharge to SNF TBD after PT/OT eval, when clinically stable.  Subjective: No significant events overnight, patient was admitted due to acute stroke, still having difficulty word finding and decreased vision in the left eye.  Denies any headache.  Feels dizziness while sitting up.  Denies any chest pain or palpitation, no shortness of breath. Denies any weakness or numbness in the bilateral upper and lower extremities.  Physical Exam: General: NAD, lying comfortably Appear in no distress, affect appropriate Eyes: PERRLA, vision as below ENT: Oral Mucosa Clear, moist  Neck: no JVD,  Cardiovascular: S1 and S2 Present, no Murmur,  Respiratory: good respiratory effort, Bilateral Air entry equal and Decreased, no Crackles, no wheezes Abdomen: Bowel Sound present, Soft and no tenderness,  Skin: no rashes Extremities: no Pedal edema, no calf tenderness Neurologic: Expressive aphasia, able to answer yes and no questions.  Visual field defect, right lateral field and left temporal fields.  Sensation intact and power intact bilateral upper and lower extremities. Gait not checked due to patient safety concerns  Vitals:   08/19/22 0352 08/19/22 0441 08/19/22 0759 08/19/22 1112  BP: (!) 193/76 (!) 170/83 (!) 179/83 139/64  Pulse: 75 73 75 70  Resp: '19 18 17 17  '$ Temp:  98 F (36.7 C) 98.1 F (36.7 C) 98.2 F (36.8 C)  TempSrc:      SpO2: 99% 98% 98% 99%  Weight:      Height:       No intake or output data in the 24 hours ending 08/19/22 1336 Filed Weights   08/18/22 1529  Weight: 76.7 kg    Data Reviewed: I have personally  reviewed and interpreted daily labs, tele strips, imagings as discussed above. I reviewed all nursing notes, pharmacy notes, vitals, pertinent old records I have discussed plan of care as described above with RN and patient/family.  CBC: Recent Labs  Lab 08/18/22 1541  WBC 6.9  NEUTROABS 5.3  HGB 9.3*  HCT 32.9*  MCV 84.6  PLT [id number]   Basic Metabolic Panel: Recent Labs  Lab 08/18/22 1541  NA 138  K 4.2  CL 109  CO2 20*  GLUCOSE 219*  BUN 38*  CREATININE 2.00*  CALCIUM 8.6*    Studies: MR BRAIN WO CONTRAST  Result Date: 08/18/2022 CLINICAL DATA:  Acute neurologic deficit EXAM: MRI HEAD WITHOUT CONTRAST TECHNIQUE: Multiplanar, multiecho pulse sequences of the brain and surrounding structures were obtained without intravenous contrast. COMPARISON:  None Available. FINDINGS: Brain: There is a large acute infarct of the left PCA territory. No acute hemorrhage. Large amount of associated cytotoxic edema. Multiple old small vessel infarcts of the deep white matter and cerebellum. No chronic microhemorrhage or siderosis. Normal white matter signal, parenchymal volume and CSF spaces. The midline structures are normal. Vascular: Abnormal left ICA flow void Skull and upper cervical spine: Normal marrow signal.  Sinuses/Orbits: Negative. Other: None. IMPRESSION: 1. Large acute infarct of the left PCA territory. No acute hemorrhage or mass effect. 2. Abnormal left ICA flow void, consistent with occlusion or severe stenosis. 3. Multiple old small vessel infarcts of the deep white matter and cerebellum. Electronically Signed   By: Ulyses Jarred M.D.   On: 08/18/2022 23:28   US Carotid Bilateral  Result Date: 08/18/2022 CLINICAL DATA:  Cerebrovascular accident. EXAM: BILATERAL CAROTID DUPLEX ULTRASOUND TECHNIQUE: Pearline Cables scale imaging, color Doppler and duplex ultrasound were performed of bilateral carotid and vertebral arteries in the neck. COMPARISON:  Bilateral carotid ultrasound 12/19/2021  FINDINGS: Criteria: Quantification of carotid stenosis is based on velocity parameters that correlate the residual internal carotid diameter with NASCET-based stenosis levels, using the diameter of the distal internal carotid lumen as the denominator for stenosis measurement. The following velocity measurements were obtained: RIGHT ICA: 127/52 cm/sec CCA: [id number] cm/sec SYSTOLIC ICA/CCA RATIO:  2.3 ECA: 101 cm/sec LEFT ICA: 25/1 cm/sec CCA: 0000000 cm/sec SYSTOLIC ICA/CCA RATIO:  0.7 ECA: 105 cm/sec RIGHT CAROTID ARTERY: Heterogeneous atherosclerotic plaque within the distal common carotid artery and proximal internal carotid artery. RIGHT VERTEBRAL ARTERY:  Antegrade flow. LEFT CAROTID ARTERY: Moderate plaque within left carotid bulb and high-grade plaque within the proximal left internal carotid artery. The left ICA high-grade plaque with intermittent flow in between shadowing plaques. It is difficult to exclude a focal region of mid left ICA occlusion on the provided images. LEFT VERTEBRAL ARTERY:  Antegrade flow. IMPRESSION: 1. High-grade stenosis of the proximal left internal carotid artery with flow documented but with intervening shadowing plaques precluding evaluation of some regions. Given the shadowing plaque it is difficult to exclude a focal region of occlusion in the mid left internal carotid artery on the provided images. 2. Right internal carotid artery peak systolic velocity of [id number] cm per second corresponds to the lower end of 50-69% stenosis. 3. Antegrade flow in the bilateral vertebral arteries. Electronically Signed   By: Yvonne Kendall M.D.   On: 08/18/2022 19:39   CT HEAD WO CONTRAST  Result Date: 08/18/2022 CLINICAL DATA:  Expressive aphasia.  Acute neuro deficit. EXAM: CT HEAD WITHOUT CONTRAST TECHNIQUE: Contiguous axial images were obtained from the base of the skull through the vertex without intravenous contrast. RADIATION DOSE REDUCTION: This exam was performed according to the departmental  dose-optimization program which includes automated exposure control, adjustment of the mA and/or kV according to patient size and/or use of iterative reconstruction technique. COMPARISON:  None Available. FINDINGS: Brain: Large field of parenchymal hypodensity involving the white matter and cerebral cortex of the LEFT parietal lobe posteriorly and superiorly as well as the LEFT occipital lobe. Extension into the LEFT mesotemporal lobe additionally. Region of parietooccipital involvement measures approximately 7.0 x 4.5 4.6 cm (75 cc). No intracranial hemorrhage identified. No hyperdense vessel identified. No midline shift or mass effect.  No ventriculomegaly. Mild cortical atrophy noted. Minimal white matter microvascular disease. Vascular: No hyperdense vessel or unexpected calcification. Skull: Normal. Negative for fracture or focal lesion. Sinuses/Orbits: No acute finding. Other: None. IMPRESSION: 1. Large region of parenchymal hypodensity in the LEFT parietooccipital lobe consistent with acute or subacute infarction. 2. No intracranial hemorrhage. 3. No midline shift or mass effect. Findings conveyed toJessup MD on 08/18/2022  at16:05. Electronically Signed   By: Suzy Bouchard M.D.   On: 08/18/2022 16:07    Scheduled Meds:  allopurinol  100 mg Oral Daily   aspirin EC  81 mg Oral Daily   atorvastatin  40 mg Oral QHS   calcitRIOL  0.25 mcg Oral Daily   calcium carbonate  1 tablet Oral TID   clopidogrel  75 mg Oral Daily   enoxaparin (LOVENOX) injection  30 mg Subcutaneous QHS   feeding supplement  237 mL Oral TID BM   insulin aspart  0-15 Units Subcutaneous TID WC   levothyroxine  200 mcg Oral Q M,W,F   multivitamin with minerals  1 tablet Oral BID   pantoprazole  20 mg Oral Daily   Continuous Infusions: PRN Meds: acetaminophen **OR** acetaminophen (TYLENOL) oral liquid 160 mg/5 mL **OR** acetaminophen, oxyCODONE, senna-docusate  Time spent: 35 minutes  Author: Val Riles. MD [facility] 08/19/2022 1:36 PM  To reach On-call, see care teams to locate the attending and reach out to them via [email or web address]. If 7PM-7AM, please contact night-coverage If you still have difficulty reaching the attending provider, please page the [facility] (Director on Call) for [facility] on amion for assistance.

## 2022-08-19 NOTE — Evaluation (Signed)
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 06/24/2022   Weakness of right upper extremity 05/20/2022   CVA (cerebral vascular accident) ([place]) 05/20/2022   Hypoglycemia 04/07/2022   Abnormal EKG 04/07/2022   Ischemic pain of left foot    Ischemic foot 07/15/2019   Healthcare maintenance 11/30/2018   Amaurosis fugax of right eye 12/14/2017   Bilateral leg edema 09/28/2017   Hyperlipidemia, mixed 09/28/2017   Anemia 09/01/2017   Chronic insomnia 01/26/2017   Postmenopausal 08/05/2016   CKD (chronic kidney disease) stage 4, GFR 15-29 ml/min ([place]) 10/16/2014   Hypothyroidism 08/06/2014   Type 2 diabetes mellitus without complication ([place]) 123XX123   Feces contents abnormal 11/07/2013   HYPERTENSION, BENIGN 11/10/2008   CAD, ARTERY BYPASS GRAFT 11/10/2008   Past Medical History:  Past Medical History:  Diagnosis Date   Acute CVA (cerebrovascular accident) ([place]) 12/18/2021   Acute gastric ulcer without hemorrhage or perforation    Acute on chronic systolic CHF (congestive heart failure), NYHA class 3 ([place]) 08/03/2019   Formatting of this note might be different from the original. Global ef 35%   Anemia    Anemia, iron deficiency 11/07/2013   Arthritis    CAD (coronary artery disease)    Cerebrovascular accident [facility])    CHF (congestive heart failure) ([place])    Chicken pox    Chronic kidney disease    Colitis    ? 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   Sleep apnea    Type 2 diabetes mellitus with diabetic peripheral angiopathy without gangrene, without long-term current use of insulin ([place])    Past Surgical History:  Past Surgical History:  Procedure Laterality Date   ABDOMINAL HYSTERECTOMY     CAROTID ANGIOGRAPHY Left 07/21/2022   Procedure: CAROTID ANGIOGRAPHY;  Surgeon: Katha Cabal, MD;  Location: [place] [facility];  Service: Cardiovascular;  Laterality: Left;   CHOLECYSTECTOMY     COLONOSCOPY WITH PROPOFOL N/A 12/24/2014   Procedure: COLONOSCOPY WITH PROPOFOL;  Surgeon: Hulen Luster, MD;  Location[facility] ENDOSCOPY;  Service: Gastroenterology;  Laterality: N/A;   CORONARY ARTERY BYPASS GRAFT     ESOPHAGOGASTRODUODENOSCOPY N/A 09/02/2017   Procedure: ESOPHAGOGASTRODUODENOSCOPY (EGD);  Surgeon: Virgel Manifold, MD;  Location: [facility] ENDOSCOPY;  Service: Endoscopy;  Laterality: N/A;   ESOPHAGOGASTRODUODENOSCOPY (EGD) WITH PROPOFOL N/A 12/24/2014   Procedure: ESOPHAGOGASTRODUODENOSCOPY (EGD) WITH PROPOFOL;  Surgeon: Hulen Luster, MD;  Location[facility] ENDOSCOPY;  Service: Gastroenterology;  Laterality: N/A;   JOINT REPLACEMENT  LOWER EXTREMITY ANGIOGRAPHY Left 07/17/2019   Procedure: Lower Extremity Angiography;  Surgeon: Algernon Huxley, MD;  Location: [employer] [facility] CV LAB;  Service: Cardiovascular;  Laterality: Left;   ROUX-EN-Y GASTRIC BYPASS     loss over 100 pounds   TONSILLECTOMY     TOTAL KNEE ARTHROPLASTY Left    HPI:  Per H&P, "is a 71 y.o. female with medical history significant  of prior CVA on aspirin + Brilinta, hypertension, hyperlipidemia, hypothyroidism, HFrEF with HCM, CKD stage IV, type 2 diabetes, CAD, PAD, who presents to the ED due to difficulty speaking.  History limited due to aphasia.     [name] is unable to quantify when her symptoms began.  She is able to express difficulty with her speech and vision.  She describes seeing 2 different images from each eye and feels that they are not seeing together.  She denies any pain, difficulty breathing, focal weakness or lower extremity swelling.  She states aspirin was last taken this morning and she believes Brilinta was last taken either yesterday or the day before.     Per chart review, patient has a friend that checks on her daily.  Yesterday at approximately 1 PM, patient was able to speak without any difficulty.  When her friend called her today in the morning, patient was noted to have difficulty with her speech.  Due to this, EMS was called." Per Brain MRI, pt w/ "1. Large acute infarct of the left PCA territory. No acute  hemorrhage or mass effect.  2. Abnormal left ICA flow void, consistent with occlusion or severe  stenosis.  3. Multiple old small vessel infarcts of the deep white matter and  cerebellum."   Assessment / Plan / Recommendation Clinical Impression   Pt seen today for cognitive-linguistic evaluation. Pt laying in bed w/ lights off upon upon SLP arrival. Pt responsive to yes/no questions and agreeable to evaluation w/ gentle cues. Pt alert and pleasant w/ expressive/receptive language deficits present during eval. Noted she had not eaten lunch meal. Offered lunch, but pt declined. Pt left laying in bed w/ bed alarm set, call button in reach, and NSG present.   Pt appears to present w/ expressive/receptive language deficits. Her speech is largely characterized by significant perseverations, circumlocutions, phonemic and semantic paraphasias, and anomia. Pt did not appear aware of deficits during the eval  and did not attempt to self-correct errors. Pt's paraphasias occasionally successful in approximation of target words.  Pt evaluated using informal means d/t pt's significant expressive aphasia. She answered basic yes/no questions regarding environment/orientation w/ 82% accuracy independently. Pt unable to answer orientation questions (year, month, city, city of residence, place) independently; given binary choices, pt answered orientation questions w/ 80% accuracy. Similarly, pt exhibited difficulty w/ confrontational naming of objects but accurately identified 83% of objects given binary choices and repetition. Noted instead of answering directly to question, pt often circumlocuted w/ paraphasias and word salad. Small automatic phrase within communication were appropriate; however, pt could not identify/retrieve content words. Pt able to demonstrate object function in approximately 33% trials independently, increasing to 66% given mod/max cues and binary choices. Pt frequently perseverated during object naming/function and required redirections to attend to task. Pt followed one step directions w/ 60% accuracy independently -- visual cues and modeling appeared helpful in improving her performance. Pt unable to follow two-step commands. Noted pt able to express wants/need intermittently given binary choices and yes/no questions.  Pt exhibited relative strengths in speech intelligibility (100% intelligible) and  repetition. Binary choices, repetitions, models, and verbal/visual/tactile appear helpful in improving functional communication. Pt's motor speech skills and vocal quality appear WFL.  Of note: Informal OM exam appeared [facility]. No unilateral lingual or labial weaknesses noted. Pt missing lower molars and all upper teeth. Recommend Dys 3 diet (for ease of oral phase management) w/ thin liquids.  Recommend acute ST therapy to address expressive/receptive language deficits during admission. Recommend f/u  ST at next venue of care for ongoing assessment and POC to determine needs improving communication skills in ADLs. Recommend using binary choices, repetitions, and verbal/visual/tactile cues, and overall simple, concrete language when communicating w/ pt. Pt likely needs frequent redirections to attend to task at hand. RN updated and agreed.    SLP Assessment  SLP Recommendation/Assessment: Patient needs continued Speech [facility] SLP Visit Diagnosis: Aphasia (R47.01)    Recommendations for follow up therapy are one component of a multi-disciplinary discharge planning process, led by the attending physician.  Recommendations may be updated based on patient status, additional functional criteria and insurance authorization.    Follow Up Recommendations  Follow physician's recommendations for discharge plan and follow up therapies    Assistance Recommended at Discharge  Frequent or constant Supervision/Assistance  Functional Status Assessment Patient has had a recent decline in their functional status and demonstrates the ability to make significant improvements in function in a reasonable and predictable amount of time.  Frequency and Duration min 2x/week  2 weeks      SLP Evaluation Cognition  Overall Cognitive Status: Impaired/Different from baseline Arousal/Alertness: Awake/alert Orientation Level: Oriented to person       Comprehension  Auditory Comprehension Overall Auditory Comprehension: Impaired Yes/No Questions: Impaired Basic Biographical Questions: 51-75% accurate Basic Immediate Environment Questions: 50-74% accurate Commands: Impaired One Step Basic Commands: 50-74% accurate Two Step Basic Commands: 0-24% accurate Conversation: Simple Interfering Components: Visual impairments EffectiveTechniques: Repetition Visual Recognition/Discrimination Discrimination: Not tested Reading Comprehension Reading Status: Not tested    Expression  Expression Primary Mode of Expression: Verbal Verbal Expression Overall Verbal Expression: Impaired Initiation: No impairment Level of Generative/Spontaneous Verbalization: Word;Phrase;Sentence;Conversation Repetition:  (Relative strength) Naming: Impairment Confrontation: Impaired Convergent: 0-24% accurate Verbal Errors: Semantic paraphasias;Phonemic paraphasias;Not aware of errors;Perseveration (Circumlocution) Non-Verbal Means of Communication: Not applicable Written Expression Dominant Hand: Right Written Expression: Not tested   Oral / Motor  Oral Motor/Sensory Function Overall Oral Motor/Sensory Function: Within functional limits Motor Speech Overall Motor Speech: Appears within functional limits for tasks assessed Respiration: Within functional limits Phonation: Normal Resonance: Within functional limits Articulation: Within functional limitis Intelligibility: Intelligible Motor Planning: Witnin functional limits Motor Speech Errors: Not applicable           Randall Hiss Graduate Clinician [place], Speech Pathology   Randall Hiss 08/19/2022, 2:21 PM

## 2022-08-19 NOTE — Inpatient Diabetes Management (Signed)
Inpatient Diabetes Program Recommendations  AACE/ADA: New Consensus Statement on Inpatient Glycemic Control  Target Ranges:  Prepandial:   less than 140 mg/dL      Peak postprandial:   less than 180 mg/dL (1-2 hours)      Critically ill patients:  140 - 180 mg/dL    Latest Reference Range & Units 08/18/22 15:32 08/18/22 21:26 08/19/22 08:00 08/19/22 11:36  Glucose-Capillary 70 - 99 mg/dL 195 (H) 167 (H) 201 (H) 148 (H)    Latest Reference Range & Units 08/18/22 15:41  Glucose 70 - 99 mg/dL 219 (H)    Latest Reference Range & Units 05/20/22 04:54  Hemoglobin A1C 4.8 - 5.6 % 6.8 (H)   Review of Glycemic Control  Diabetes history: DM2 Outpatient Diabetes medications: None; had been on Amaryl (discontinued per discharge summary on 04/09/22) and Tradjenta 5 mg daily (discontinued per discharge summary on 05/21/22) Current orders for Inpatient glycemic control: Novolog 0-15 units TID with meals  Inpatient Diabetes Program Recommendations:    HbgA1C: Per Care Everywhere, A1C 7.8% on 06/29/22.  Outpatient DM medication: Anticipate patient will need to be prescribed oral DM medication at discharge.  NOTE: Patient admitted with acute CVA. Noted initial lab glucose 219 mg/dl on 08/18/22 at 15:41, patient has DM2 hx, and not on any outpatient DM medications. In reviewing the chart noted patient was inpatient 05/19/22-05/21/22 (weakness of right upper extremity, acute CVA)  and 04/07/22-04/09/22 (syncope, collapse, hypoglycemia).  Per discharge summary on 04/09/22 Amaryl 1 mg was discontinued (due to hypoglycemia) but Tradjenta 5 mg daily was continued. Per discharge summary on 05/21/22 Tradjenta 5 mg was discontinued. Noted patient seen PCP on 07/06/22 and was prescribed Jardiance 10 mg daily; telephone note on 07/06/22 patient called PCP office to let them know she could not afford the Jardiance ($50 copay per month).  No DM medications currently listed on home medication list.   Thanks, Barnie Alderman, RN,  MSN, [place] Diabetes Coordinator Inpatient Diabetes Program [phone] (Team Pager from 8am to [place])

## 2022-08-19 NOTE — Evaluation (Signed)
Occupational Therapy Evaluation Patient Details Name: [name] MRN: [id number] DOB: 1950/09/24 Today's Date: 08/19/2022   History of Present Illness 72 y.o. female with medical history significant of prior CVA on aspirin + Brilinta, hypertension, hyperlipidemia, hypothyroidism, HFrEF with HCM, CKD stage IV, type 2 diabetes, CAD, PAD, who presents to the ED due to difficulty speaking. Pt was admitted 3 months ago for CVA. Pt now with new L PCA infarct.   Clinical Impression   Patient presenting with decreased Ind in self care, balance, functional mobility/transfers, endurance, and safety awareness.  Pt with significant expressive aphasia during session but does report living at home alone and independently at baseline without use of AD. Pt drives and is active in community and church. Pt with prior admission ~ 3 months ago for CVA and home set up recorded at that time supports this. 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 Pt performing bed mobility with min A and needing min - mod A to take several steps . Pt is far from independent baseline and would benefit from intensive inpatient rehab to address functional deficits. Patient will benefit from acute OT to increase overall independence in the areas of ADLs, functional mobility, and safety awareness in order to safely discharge to next venue of care.      Recommendations for follow up therapy are one component of a multi-disciplinary discharge planning process, led by the attending physician.  Recommendations may be updated based on patient status, additional functional criteria and insurance authorization.   Follow Up Recommendations  Acute inpatient rehab (3hours/day)     Assistance Recommended at Discharge Frequent or constant Supervision/Assistance  Patient can return home with the following A lot of help with walking and/or transfers;A lot of help with bathing/dressing/bathroom;Assistance with  cooking/housework;Assist for transportation;Help with stairs or ramp for entrance;Direct supervision/assist for financial management;Direct supervision/assist for medications management    Functional Status Assessment  Patient has had a recent decline in their functional status and demonstrates the ability to make significant improvements in function in a reasonable and predictable amount of time.  Equipment Recommendations  Other (comment) (defer to next venue of care)       Precautions / Restrictions Precautions Precautions: Fall      Mobility Bed Mobility Overal bed mobility: Needs Assistance Bed Mobility: Supine to Sit, Sit to Supine     Supine to sit: Min assist, HOB elevated Sit to supine: Min assist, HOB elevated   General bed mobility comments: assistance for trunk support    Transfers Overall transfer level: Needs assistance Equipment used: 1 person hand held assist Transfers: Sit to/from Stand, Bed to chair/wheelchair/BSC Sit to Stand: Min assist     Step pivot transfers: Min assist, Mod assist            Balance Overall balance assessment: Needs assistance Sitting-balance support: Feet supported Sitting balance-Leahy Scale: Good     Standing balance support: During functional activity, Single extremity supported Standing balance-Leahy Scale: Poor                             ADL either performed or assessed with clinical judgement   ADL Overall ADL's : Needs assistance/impaired                     Lower Body Dressing: Minimal assistance;Sitting/lateral leans Lower Body Dressing Details (indicate cue type and reason): to don B socks seated on EOB Toilet Transfer: Minimal  assistance;Moderate assistance Toilet Transfer Details (indicate cue type and reason): simulated Toileting- Clothing Manipulation and Hygiene: Moderate assistance;Sit to/from stand               Vision Baseline Vision/History: 1 Wears glasses Patient  Visual Report: Peripheral vision impairment Vision Assessment?: Vision impaired- to be further tested in functional context Additional Comments: Pt appears to have R homonymous hemianopsia with vision testing            Pertinent Vitals/Pain Pain Assessment Pain Assessment: Faces Faces Pain Scale: Hurts even more Pain Location: headache Pain Descriptors / Indicators: Aching, Discomfort Pain Intervention(s): Monitored during session, Premedicated before session, Repositioned     Hand Dominance Right   Extremity/Trunk Assessment Upper Extremity Assessment Upper Extremity Assessment: Overall WFL for tasks assessed;Generalized weakness   Lower Extremity Assessment Lower Extremity Assessment: Overall WFL for tasks assessed;Generalized weakness       Communication Communication Communication: Expressive difficulties   Cognition Arousal/Alertness: Awake/alert Behavior During Therapy: Flat affect Overall Cognitive Status: Difficult to assess                                 General Comments: Pt follows commands with min cuing for technique. Significant expressive aphasia and word finding difficulties but able to correctly answer when given choice of 2.                Home Living Family/patient expects to be discharged to:: Private residence Living Arrangements: Alone Available Help at Discharge: Friend(s);Available PRN/intermittently Type of Home: Apartment Home Access: Stairs to enter Entrance Stairs-Number of Steps: 3 Entrance Stairs-Rails: None Home Layout: Two level;Able to live on main level with bedroom/bathroom Alternate Level Stairs-Number of Steps: flight   Bathroom Shower/Tub: Occupational psychologist: Standard     Home Equipment: [place] (2 wheels);Rollator (4 wheels);BSC/3in1   Additional Comments: information obtained from prior admission in December 2023 as pt having difficulty verbalizing due to expressive  aphasia      Prior Functioning/Environment Prior Level of Function : Independent/Modified Independent;Driving             Mobility Comments: amb no AD, no recent fall history ADLs Comments: MOD I-I in ADL/IADL, cooks, cleans, drives, mows yard        OT Problem List: Decreased strength;Decreased activity tolerance;Decreased safety awareness;Impaired balance (sitting and/or standing);Decreased knowledge of use of DME or AE;Decreased coordination;Decreased cognition;Impaired vision/perception      OT Treatment/Interventions: Self-care/ADL training;Therapeutic exercise;Therapeutic activities;Energy conservation;Cognitive remediation/compensation;Neuromuscular education;Visual/perceptual remediation/compensation;Patient/family education;DME and/or AE instruction;Balance training    OT Goals(Current goals can be found in the care plan section) Acute Rehab OT Goals Patient Stated Goal: to return to PLOF OT Goal Formulation: With patient Time For Goal Achievement: 09/02/22 Potential to Achieve Goals: Fair ADL Goals Pt Will Perform Grooming: with supervision;standing Pt Will Perform Lower Body Dressing: with supervision;sit to/from stand Pt Will Transfer to Toilet: with supervision;ambulating Pt Will Perform Toileting - Clothing Manipulation and hygiene: with supervision;sit to/from stand Additional ADL Goal #1: Pt will demonstrate visual strategies to locate self care items to the R with min cuing for technique.  OT Frequency: Min 3X/week       AM-PAC OT "6 Clicks" Daily Activity     Outcome Measure Help from another person eating meals?: A Little Help from another person taking care of personal grooming?: A Little Help from another person toileting, which includes using toliet, bedpan, or urinal?:  A Lot Help from another person bathing (including washing, rinsing, drying)?: A Lot Help from another person to put on and taking off regular upper body clothing?: A Little Help from  another person to put on and taking off regular lower body clothing?: A Lot 6 Click Score: 15   End of Session Nurse Communication: Mobility status  Activity Tolerance: Patient limited by fatigue Patient left: in bed;with call bell/phone within reach;with bed alarm set;with family/visitor present  OT Visit Diagnosis: Unsteadiness on feet (R26.81);Repeated falls (R29.6);Muscle weakness (generalized) (M62.81)                Time: [id number] OT Time Calculation (min): 25 min Charges:  OT General Charges $OT Visit: 1 Visit OT Evaluation $OT Eval Moderate Complexity: 1 Mod OT Treatments $Self Care/Home Management : 8-22 mins  Darleen Crocker, MS, OTR/L , CBIS ascom [phone]  08/19/22, 1:33 PM

## 2022-08-19 NOTE — Progress Notes (Signed)
  Chaplain On-Call responded to Spiritual Care Consult Order from Jose Persia, MD.  The Order was to provide Advance Directives information to the patient.  Chaplain visited patient at 1025 hours, and provided the AD documents and education to the patient. The patient will read the documents and will contact her Nurse if she chooses to complete them while in hospital.  Chaplain offered spiritual and emotional support.  Chaplain Pollyann Samples M.Div., [facility]

## 2022-08-19 NOTE — Inpatient Diabetes Management (Signed)
Inpatient Diabetes Program Recommendations  AACE/ADA: New Consensus Statement on Inpatient Glycemic Control (2015)  Target Ranges:  Prepandial:   less than 140 mg/dL      Peak postprandial:   less than 180 mg/dL (1-2 hours)      Critically ill patients:  140 - 180 mg/dL   Lab Results  Component Value Date   GLUCAP 148 (H) 08/19/2022   HGBA1C 6.8 (H) 05/20/2022    Lyndel Safe, CPhT did cost check on Tradjenta and Jardiance:  Tradjenta copay is $11.20, Jardiance copay is $11.20   Thank you, Bethena Roys E. Nikesha Kwasny, RN, MSN, CDE  Diabetes Coordinator Inpatient Glycemic Control Team Team Pager [phone] (8am-5pm) 08/19/2022 1:03 PM

## 2022-08-19 NOTE — TOC Initial Note (Signed)
Transition of Care [facility]) - Initial/Assessment Note    Patient Details  Name: [name] MRN: [id number] Date of Birth: [birth year]  [facility] CM/SW Contact:    Gerilyn Pilgrim, LCSW Phone Number: 08/19/2022, 3:42 PM  Clinical Narrative:   CSW spoke with patients friend [name] who states that she would like a chaplain consult for doing HCPOA paperwork for patient. [name] reports that the patient has no family to assist at discharge she states she could assist by visiting daily and checking on her but states that she only has a sister who lives very far away and who she has a strained relationship with. [name] reports she does want pt to get some kind of rehab but states there will not be anyone staying with her at discharge. [name] states she thinks pt has medicaid even though it is not listed for patient. CSW sent message to Salomon Fick our financial navigator to see if pt has medicaid and if not if she can get an application started so that pt could have PCS services at discharge. [name] states that pt would likely not agree to any services in the home as she reports pt is very stubborn to accept any assistance. [name] states pt is still current with her PCP Dr. Ouida Sills with [facility]. CSW will share with rest of team and get workup started for rehab for patient.              3:47pm Darcy with financial navigator confirmed that pt does not have current medicaid. Darcy to see patient tomorrow to get application started.        Patient Goals and CMS Choice            Expected Discharge Plan and Services                                              Prior Living Arrangements/Services                       Activities of Daily Living Home Assistive Devices/Equipment: None ADL Screening (condition at time of admission) Patient's cognitive ability adequate to safely complete daily activities?: No Is the patient deaf or have difficulty hearing?: Yes Does the  patient have difficulty seeing, even when wearing glasses/contacts?: Yes Does the patient have difficulty concentrating, remembering, or making decisions?: Yes Patient able to express need for assistance with ADLs?: Yes Does the patient have difficulty dressing or bathing?: Yes Independently performs ADLs?: Yes (appropriate for developmental age) Does the patient have difficulty walking or climbing stairs?: No Weakness of Legs: Both Weakness of Arms/Hands: Both  Permission Sought/Granted                  Emotional Assessment              Admission diagnosis:  Aphasia [R47.01] Acute CVA (cerebrovascular accident) ([place]) [I63.9] Cerebrovascular accident (CVA), unspecified mechanism ([place]) [I63.9] Patient Active Problem List   Diagnosis Date Noted   Acute CVA (cerebrovascular accident) ([place]) 08/18/2022   HFrEF (heart failure with reduced ejection fraction) ([place]) 08/18/2022   Carotid stenosis 08/11/2022   PAD (peripheral artery disease) ([place]) 08/11/2022   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 06/24/2022   Weakness of right upper extremity 05/20/2022   CVA (cerebral vascular accident) ([place]) 05/20/2022   Hypoglycemia 04/07/2022  Abnormal EKG 04/07/2022   Ischemic pain of left foot    Ischemic foot 07/15/2019   Healthcare maintenance 11/30/2018   Amaurosis fugax of right eye 12/14/2017   Bilateral leg edema 09/28/2017   Hyperlipidemia, mixed 09/28/2017   Anemia 09/01/2017   Chronic insomnia 01/26/2017   Postmenopausal 08/05/2016   CKD (chronic kidney disease) stage 4, GFR 15-29 ml/min ([place]) 10/16/2014   Hypothyroidism 08/06/2014   Type 2 diabetes mellitus without complication ([place]) [id number]   Feces contents abnormal 11/07/2013   HYPERTENSION, BENIGN 11/10/2008   CAD, ARTERY BYPASS GRAFT 11/10/2008   PCP:  Kirk Ruths, MD Pharmacy:   [place], [place] - [address]  [place] [address] Phone: [phone] Fax: [phone]     Social Determinants of Health (SDOH) Social History: SDOH Screenings   Food Insecurity: No Food Insecurity (08/19/2022)  Housing: Low Risk  (08/19/2022)  Transportation Needs: No Transportation Needs (08/19/2022)  Utilities: Not At Risk (08/19/2022)  Tobacco Use: Low Risk  (08/19/2022)   SDOH Interventions:     Readmission Risk Interventions     No data to display

## 2022-08-19 NOTE — Consult Note (Addendum)
NEUROLOGY CONSULTATION NOTE   Date of service: August 19, 2022 Patient Name: [name] MRN:  [id number] DOB:  02-Feb-1951 Reason for consult: acute ischemic stroke Requesting physician: Dr. Val Riles _ _ _   _ __   _ __ _ _  __ __   _ __   __ _  History of Present Illness   This is a 72 year old woman with past medical history of prior CVA on aspirin and Brilinta, hypertension, hyperlipidemia, hypothyroidism, heart failure with reduced ejection fraction with hypertrophic cardiomyopathy, CKD stage IV, type 2 diabetes, CAD, PAD presents to ED after difficulty speaking.  History is limited secondary to aphasia and HPI is gleaned from chart review.  It appears that her symptoms began on Sunday when she started to experience difficulty with her speech and vision.  She has diplopia that she is not able to fully characterize except for seeing 2 images from each eye that are not "together."  Patient does have a friend who checks on her daily.  On Monday when she went to check on her she was speaking normally and yesterday when she went to visit her in the morning patient was noted to have difficulty with her speech and EMS was called.  CT of the head in the ED demonstrated a large region of parenchymal hypodensity in the left parieto-occipital lobe consistent with acute or subacute infarct.  She is on aspirin and Brilinta as an outpatient prescribed by vascular surgery and these were continued on admission.  MRI brain showed a large acute infarct of the left PCA territory with no acute hemorrhage or mass effect.  Was noted to have multiple old small vessel infarcts in the deep white matter and cerebellum.  Flow-void was noted in the left ICA; left ICA was previously noted to be severely stenosed or occluded on MRA head in December 2023.  Carotid ultrasound performed this admission showed high-grade stenosis of the proximal left ICA with flow documented but with intervening shadowing plaques precluding  evaluation of some regions and making it difficult to exclude a focal region of occlusion in the mid left ICA.  Vascular surgery already follows patient in clinic.  They were consulted and patient today and recommended no acute intervention in the setting of high risk of reperfusion injury in the setting of large acute ischemic stroke.  They will follow-up with her in clinic in 2 weeks to discuss whether intervention at that time would be appropriate.  All CNS imaging personally reviewed; I agree with radiology interpretations   ROS   UTA 2/2 aphasia  Past History   I have reviewed the following:  Past Medical History:  Diagnosis Date   Acute CVA (cerebrovascular accident) ([place]) 12/18/2021   Acute gastric ulcer without hemorrhage or perforation    Acute on chronic systolic CHF (congestive heart failure), NYHA class 3 ([place]) 08/03/2019   Formatting of this note might be different from the original. Global ef 35%   Anemia    Anemia, iron deficiency 11/07/2013   Arthritis    CAD (coronary artery disease)    Cerebrovascular accident [facility])    CHF (congestive heart failure) ([place])    Chicken pox    Chronic kidney disease    Colitis    ? 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   Sleep apnea    Type 2 diabetes mellitus with diabetic peripheral angiopathy without gangrene, without long-term current use of insulin [facility])    Past Surgical History:  Procedure Laterality Date   ABDOMINAL HYSTERECTOMY     CAROTID  ANGIOGRAPHY Left 07/21/2022   Procedure: CAROTID ANGIOGRAPHY;  Surgeon: Katha Cabal, MD;  Location: [place] CV LAB;  Service: Cardiovascular;  Laterality: Left;   CHOLECYSTECTOMY     COLONOSCOPY WITH PROPOFOL N/A 12/24/2014   Procedure: COLONOSCOPY WITH PROPOFOL;  Surgeon: Hulen Luster, MD;  Location: [facility] ENDOSCOPY;  Service: Gastroenterology;  Laterality: N/A;   CORONARY ARTERY BYPASS GRAFT     ESOPHAGOGASTRODUODENOSCOPY N/A 09/02/2017   Procedure: ESOPHAGOGASTRODUODENOSCOPY (EGD);  Surgeon: Virgel Manifold, MD;  Location: [facility] ENDOSCOPY;  Service: Endoscopy;  Laterality: N/A;   ESOPHAGOGASTRODUODENOSCOPY (EGD) WITH PROPOFOL N/A 12/24/2014   Procedure: ESOPHAGOGASTRODUODENOSCOPY (EGD) WITH PROPOFOL;  Surgeon: Hulen Luster, MD;  Location: [facility] ENDOSCOPY;  Service: Gastroenterology;  Laterality: N/A;   JOINT REPLACEMENT     LOWER EXTREMITY ANGIOGRAPHY Left 07/17/2019   Procedure: Lower Extremity Angiography;  Surgeon: Algernon Huxley, MD;  Location: [place] CV LAB;  Service: Cardiovascular;  Laterality: Left;   ROUX-EN-Y GASTRIC BYPASS     loss over 100 pounds   TONSILLECTOMY     TOTAL KNEE ARTHROPLASTY Left    Family History  Problem Relation Age of Onset   Coronary artery disease Other    Stroke Maternal Grandmother    Diabetes Paternal Grandmother        diabetic coma   Heart attack Father    Heart attack Mother    Heart attack Maternal Grandfather    Heart attack Paternal Grandfather    Breast cancer Neg Hx    Social History   Socioeconomic History   Marital status: Single    Spouse name: Not on file   Number of children: 0   Years of education: Not on file   Highest education level: Not on file  Occupational History   Occupation: customer service    Employer: [employer]  Tobacco Use   Smoking status: Never   Smokeless tobacco: Never  Vaping Use   Vaping Use: Never used  Substance and Sexual Activity   Alcohol use: No   Drug use: No   Sexual activity: Not on  file  Other Topics Concern   Not on file  Social History Narrative   Single, no children   Customer service   1 caffeine/day   Social Determinants of Health   Financial Resource Strain: Not on file  Food Insecurity: No Food Insecurity (08/19/2022)   Hunger Vital Sign    Worried About Running Out of Food in the Last Year: Never true    Ran Out of Food in the Last Year: Never true  Transportation Needs: No Transportation Needs (08/19/2022)   PRAPARE - Hydrologist (Medical): No    Lack of Transportation (Non-Medical): No  Physical Activity: Not on file  Stress: Not on file  Social Connections: Not on file   Allergies  Allergen Reactions   Niacin Other (See Comments)  Heart Attack Other reaction(s): Other (see comments) Heart Attack   Tramadol     Other reaction(s): Other (see comments) Intolerance, makes woozy   Ultram [Tramadol Hcl]     Intolerance, makes woozy   Baclofen Other (See Comments)    Dizziness Other reaction(s): Other (see comments) Dizziness   Cardura [Doxazosin Mesylate] Other (See Comments)    Dizzy with passing out spells   Doxazosin     Other reaction(s): Other (see comments) Dizzy with passing out spells Dizzy with passing out spells   Morphine And Related Nausea And Vomiting and Nausea Only   Other Rash    Gold metal (nickel in the gold) Gold metal Gold metal (nickel in the gold)    Medications   Medications Prior to Admission  Medication Sig Dispense Refill Last Dose   allopurinol (ZYLOPRIM) 100 MG tablet Take 100 mg by mouth daily.   Past Week at Unknown   aspirin EC 81 MG tablet Take 1 tablet (81 mg total) by mouth daily. 90 tablet 0    atorvastatin (LIPITOR) 40 MG tablet Take 1 tablet (40 mg total) by mouth at bedtime. 30 tablet 1 Past Week at Unknown   calcitRIOL (ROCALTROL) 0.25 MCG capsule Take 0.25 mcg by mouth daily.   Past Week at Unknown   cloNIDine (CATAPRES) 0.1 MG tablet Take 0.1 mg by mouth 2 (two)  times daily.   Past Week at Unknown   diphenhydrAMINE (BENADRYL) 50 MG capsule Take 50 mg by mouth at bedtime as needed for sleep.   Unknown at PRN   furosemide (LASIX) 40 MG tablet Take 40 mg by mouth daily.   Past Week at Unknown   levothyroxine (SYNTHROID) 200 MCG tablet Take 200 mcg by mouth every Monday, Wednesday, and Friday.   Unknown at Unknown   losartan (COZAAR) 25 MG tablet Take 25 mg by mouth daily.   Past Week at Unknown   Multiple Vitamin (MULTIVITAMIN WITH MINERALS) TABS tablet Take 1 tablet by mouth daily.      pantoprazole (PROTONIX) 20 MG tablet Take 20 mg by mouth daily.   Past Week at Unknown   potassium chloride (KLOR-CON) 10 MEQ tablet Take 20 mEq by mouth daily.   Past Week at Unknown   ticagrelor (BRILINTA) 90 MG TABS tablet Take 1 tablet (90 mg total) by mouth 2 (two) times daily. 60 tablet 3 Unknown at Unknown   blood glucose meter kit and supplies KIT Dispense based on patient and insurance preference. Use up to four times daily as directed. 1 each 0       Current Facility-Administered Medications:    acetaminophen (TYLENOL) tablet 650 mg, 650 mg, Oral, Q4H PRN **OR** acetaminophen (TYLENOL) 160 MG/5ML solution 650 mg, 650 mg, Per Tube, Q4H PRN **OR** acetaminophen (TYLENOL) suppository 650 mg, 650 mg, Rectal, Q4H PRN, Wynelle Cleveland, RPH   allopurinol (ZYLOPRIM) tablet 100 mg, 100 mg, Oral, Daily, Jose Persia, MD, 100 mg at 08/19/22 [id number]   aspirin EC tablet 81 mg, 81 mg, Oral, Daily, Jose Persia, MD, 81 mg at 08/19/22 [id number]   atorvastatin (LIPITOR) tablet 40 mg, 40 mg, Oral, QHS, Jose Persia, MD, 40 mg at 08/18/22 2123   calcitRIOL (ROCALTROL) capsule 0.25 mcg, 0.25 mcg, Oral, Daily, Jose Persia, MD, 0.25 mcg at 08/19/22 [id number]   calcium carbonate (TUMS - dosed in mg elemental calcium) chewable tablet 200 mg of elemental calcium, 1 tablet, Oral, TID, Val Riles, MD, 200 mg of elemental calcium at 08/19/22 1153   clopidogrel (  PLAVIX) tablet 75 mg, 75  mg, Oral, Daily, Jose Persia, MD, 75 mg at 08/19/22 0927   enoxaparin (LOVENOX) injection 30 mg, 30 mg, Subcutaneous, QHS, Jose Persia, MD, 30 mg at 08/18/22 2123   feeding supplement (ENSURE ENLIVE / ENSURE PLUS) liquid 237 mL, 237 mL, Oral, TID BM, Val Riles, MD, 237 mL at 08/19/22 1154   insulin aspart (novoLOG) injection 0-15 Units, 0-15 Units, Subcutaneous, TID WC, Jose Persia, MD, 2 Units at 08/19/22 1152   levothyroxine (SYNTHROID) tablet 200 mcg, 200 mcg, Oral, Q M,W,F, Jose Persia, MD, 200 mcg at 08/19/22 [id number]   multivitamin with minerals tablet 1 tablet, 1 tablet, Oral, BID, Val Riles, MD, 1 tablet at 08/19/22 1153   oxyCODONE (Oxy IR/ROXICODONE) immediate release tablet 5 mg, 5 mg, Oral, Q6H PRN, Val Riles, MD, 5 mg at 08/19/22 1406   pantoprazole (PROTONIX) EC tablet 20 mg, 20 mg, Oral, Daily, Jose Persia, MD, 20 mg at 08/19/22 [id number]   senna-docusate (Senokot-S) tablet 1 tablet, 1 tablet, Oral, QHS PRN, Jose Persia, MD  Vitals   Vitals:   08/19/22 0441 08/19/22 0759 08/19/22 1112 08/19/22 1357  BP: (!) 170/83 (!) 179/83 139/64 (!) 153/80  Pulse: 73 75 70   Resp: '18 17 17 19  '$ Temp: 98 F (36.7 C) 98.1 F (36.7 C) 98.2 F (36.8 C) 98.3 F (36.8 C)  TempSrc:    Oral  SpO2: 98% 98% 99% 100%  Weight:      Height:         Body mass index is 27.28 kg/m.  Physical Exam   Physical Exam Gen: oriented to name and the year "20-something" HEENT: Atraumatic, normocephalic;mucous membranes moist; oropharynx clear, tongue without atrophy or fasciculations. Neck: Supple, trachea midline. Resp: CTAB, no w/r/r CV: RRR, no m/g/r; nml S1 and S2. 2+ symmetric peripheral pulses. Abd: soft/NT/ND; nabs x 4 quad Extrem: Nml bulk; no cyanosis, clubbing, or edema.  Neuro: *MS: oriented to name and the year "20-something" *Speech: sparse, marked expressive>receptive aphasia, mild dysarthria *CN:    I: Deferred   II,III: PERRLA, blinks to threat on L  but not R, optic discs unable to be visualized 2/2 pupillary constriction   III,IV,VI: EOMI w/o nystagmus, no ptosis   V: Sensation intact from V1 to V3 to LT   VII: Eyelid closure was full.  R UMN facial droop   VIII: Hearing intact to voice   IX,X: Voice normal, palate elevates symmetrically    XI: SCM/trap 5/5 bilat   XII: Tongue protrudes midline, no atrophy or fasciculations  *Motor:   Normal bulk.  No tremor, rigidity or bradykinesia. Drift R>L LE>UE *Sensory: SILT. R sided sensory neglect *Coordination:  UTA *Reflexes:  2+ and symmetric throughout without clonus; toes down-going bilat *Gait: deferred  NIHSS  1a Level of Conscious.: 0 1b LOC Questions: 2 1c LOC Commands: 1 2 Best Gaze: 0 3 Visual: 2 4 Facial Palsy: 2 5a Motor Arm - left: 1 5b Motor Arm - Right: 1 6a Motor Leg - Left: 1 6b Motor Leg - Right: 1 7 Limb Ataxia: 0 8 Sensory: 0 9 Best Language: 2 10 Dysarthria: 1 11 Extinct. and Inatten.: 1  TOTAL: 15  Premorbid mRS = 2   Labs   CBC:  Recent Labs  Lab 08/18/22 1541  WBC 6.9  NEUTROABS 5.3  HGB 9.3*  HCT 32.9*  MCV 84.6  PLT [id number]    Basic Metabolic Panel:  Lab Results  Component Value Date   NA 138  08/18/2022   K 4.2 08/18/2022   CO2 20 (L) 08/18/2022   GLUCOSE 219 (H) 08/18/2022   BUN 38 (H) 08/18/2022   CREATININE 2.00 (H) 08/18/2022   CALCIUM 8.6 (L) 08/18/2022   GFRNONAA 26 (L) 08/18/2022   GFRAA 31 (L) 07/19/2019   Lipid Panel:  Lab Results  Component Value Date   LDLCALC 75 05/20/2022   HgbA1c:  Lab Results  Component Value Date   HGBA1C 6.8 (H) 05/20/2022   Urine Drug Screen: No results found for: "LABOPIA", "COCAINSCRNUR", "LABBENZ", "AMPHETMU", "THCU", "LABBARB"  Alcohol Level     Component Value Date/Time   ETH <10 08/18/2022 1541    Data  Head CT 1. Large region of parenchymal hypodensity in the LEFT parietooccipital lobe consistent with acute or subacute infarction. 2. No intracranial hemorrhage. 3. No  midline shift or mass effect.  MRI brain wo contrast 1. Large acute infarct of the left PCA territory. No acute hemorrhage or mass effect. 2. Abnormal left ICA flow void, consistent with occlusion or severe stenosis. 3. Multiple old small vessel infarcts of the deep white matter and cerebellum.  All CNS imaging personally reviewed; I agree with radiology interpretations   Carotid US 1. High-grade stenosis of the proximal left internal carotid artery with flow documented but with intervening shadowing plaques precluding evaluation of some regions. Given the shadowing plaque it is difficult to exclude a focal region of occlusion in the mid left internal carotid artery on the provided images. 2. Right internal carotid artery peak systolic velocity of 220 cm per second corresponds to the lower end of 50-69% stenosis. 3. Antegrade flow in the bilateral vertebral arteries.  TTE - pending  Stroke Labs     Component Value Date/Time   CHOL 140 05/20/2022 0454   TRIG 97 05/20/2022 0454   HDL 46 05/20/2022 0454   CHOLHDL 3.0 05/20/2022 0454   VLDL 19 05/20/2022 0454   LDLCALC 75 05/20/2022 0454    Lab Results  Component Value Date/Time   HGBA1C 6.8 (H) 05/20/2022 04:54 AM     Impression   This is a 72 year old woman with past medical history of prior CVA on aspirin and Brilinta, hypertension, hyperlipidemia, hypothyroidism, heart failure with reduced ejection fraction with hypertrophic cardiomyopathy, CKD stage IV, type 2 diabetes, CAD, PAD who presented to ED 08/18/22 after being found by her neighbor with difficulty speaking. LKW was day prior. Patient also reports visual abnormalities including diplopia that she has difficulty describing. MRI confirms large L PCA infarct.   On MRI brain, flow-void was noted in the left ICA; left ICA was previously noted to be severely stenosed or occluded on MRA head in December 2023.  Carotid ultrasound performed this admission showed  high-grade stenosis of the proximal left ICA with flow documented but with intervening shadowing plaques precluding evaluation of some regions and making it difficult to exclude a focal region of occlusion in the mid left ICA.  Vascular surgery already follows patient in clinic.  They were consulted and patient today and recommended no acute intervention in the setting of high risk of reperfusion injury in the setting of large acute ischemic stroke.  They will follow-up with her in clinic in 2 weeks to discuss whether intervention at that time would be appropriate. Neurology is in agreement with no acute intervention given patient would be at risk of hemorrhage  Recommendations   - Permissive HTN x48 hrs from sx onset goal BP <220/110. PRN labetalol or hydralazine if BP above these  parameters. Avoid oral antihypertensives. - TTE  - Continue home ASA and brilinta - Increase atorvastatin to 80mg  daily - q4 hr neuro checks - STAT head CT for any change in neuro exam - Tele - PT/OT/SLP - Stroke education - Amb referral to neurology upon discharge - Neurology will continue to follow  ______________________________________________________________________   Thank you for the opportunity to take part in the care of this patient. If you have any further questions, please contact the neurology consultation attending.  Signed,  Su Monks, MD [facility] [phone]  If 7pm- 7am, please page neurology on call as listed in [place].  **Any copied and pasted documentation in this note was written by me in another application not billed for and pasted by me into this document.

## 2022-08-20 ENCOUNTER — Inpatient Hospital Stay: Payer: Medicare Other | Attending: Medical

## 2022-08-20 ENCOUNTER — Inpatient Hospital Stay: Payer: Medicare Other

## 2022-08-20 ENCOUNTER — Other Ambulatory Visit: Payer: Self-pay

## 2022-08-20 ENCOUNTER — Encounter: Payer: Self-pay | Admitting: Internal Medicine

## 2022-08-20 DIAGNOSIS — I639 Cerebral infarction, unspecified: Secondary | ICD-10-CM

## 2022-08-20 DIAGNOSIS — I5020 Unspecified systolic (congestive) heart failure: Secondary | ICD-10-CM

## 2022-08-20 DIAGNOSIS — Z7189 Other specified counseling: Secondary | ICD-10-CM | POA: Diagnosis not present

## 2022-08-20 DIAGNOSIS — I739 Peripheral vascular disease, unspecified: Secondary | ICD-10-CM

## 2022-08-20 DIAGNOSIS — Z515 Encounter for palliative care: Secondary | ICD-10-CM | POA: Diagnosis not present

## 2022-08-20 LAB — ECHOCARDIOGRAM COMPLETE
Area-P 1/2: 3.27 cm2
Height: 66 in
S' Lateral: 3.3 cm
Weight: 2704 oz

## 2022-08-20 LAB — BASIC METABOLIC PANEL
Anion gap: 7 (ref 5–15)
BUN: 34 mg/dL — ABNORMAL HIGH (ref 8–23)
CO2: 19 mmol/L — ABNORMAL LOW (ref 22–32)
Calcium: 8.8 mg/dL — ABNORMAL LOW (ref 8.9–10.3)
Chloride: 113 mmol/L — ABNORMAL HIGH (ref 98–111)
Creatinine, Ser: 1.73 mg/dL — ABNORMAL HIGH (ref 0.44–1.00)
GFR, Estimated: 31 mL/min — ABNORMAL LOW (ref >60)
Glucose, Bld: 182 mg/dL — ABNORMAL HIGH (ref 70–99)
Potassium: 4.2 mmol/L (ref 3.5–5.1)
Sodium: 139 mmol/L (ref 135–145)

## 2022-08-20 LAB — GLUCOSE, CAPILLARY
Glucose-Capillary: 162 mg/dL — ABNORMAL HIGH (ref 70–99)
Glucose-Capillary: 239 mg/dL — ABNORMAL HIGH (ref 70–99)
Glucose-Capillary: 149 mg/dL — ABNORMAL HIGH (ref 70–99)
Glucose-Capillary: 129 mg/dL — ABNORMAL HIGH (ref 70–99)

## 2022-08-20 LAB — CBC
HCT: 31.9 % — ABNORMAL LOW (ref 36.0–46.0)
Hemoglobin: 9.3 g/dL — ABNORMAL LOW (ref 12.0–15.0)
MCH: 23.9 pg — ABNORMAL LOW (ref 26.0–34.0)
MCHC: 29.2 g/dL — ABNORMAL LOW (ref 30.0–36.0)
MCV: 82 fL (ref 80.0–100.0)
Platelets: 272 10*3/uL (ref 150–400)
RBC: 3.89 MIL/uL (ref 3.87–5.11)
RDW: 19.4 % — ABNORMAL HIGH (ref 11.5–15.5)
WBC: 8.3 10*3/uL (ref 4.0–10.5)
nRBC: 0 % (ref 0.0–0.2)

## 2022-08-20 LAB — PHOSPHORUS: Phosphorus: 3.9 mg/dL (ref 2.5–4.6)

## 2022-08-20 LAB — HEMOGLOBIN A1C
Hgb A1c MFr Bld: 7.7 % — ABNORMAL HIGH (ref 4.8–5.6)
Mean Plasma Glucose: 174 mg/dL

## 2022-08-20 LAB — MAGNESIUM: Magnesium: 2.5 mg/dL — ABNORMAL HIGH (ref 1.7–2.4)

## 2022-08-20 MED ORDER — ONDANSETRON HCL 4 MG/2ML IJ SOLN
4.0000 mg | Freq: Four times a day (QID) | INTRAMUSCULAR | Status: DC | PRN
  Administered 2022-08-20: 4 mg via INTRAVENOUS
  Filled 2022-08-20: qty 2

## 2022-08-20 MED ORDER — SODIUM BICARBONATE 650 MG PO TABS
650.0000 mg | ORAL_TABLET | Freq: Three times a day (TID) | ORAL | Status: AC
  Administered 2022-08-20 – 2022-08-22 (×9): 650 mg via ORAL
  Filled 2022-08-20 (×9): qty 1

## 2022-08-20 MED ORDER — LOSARTAN POTASSIUM 25 MG PO TABS
25.0000 mg | ORAL_TABLET | Freq: Every day | ORAL | Status: DC
  Administered 2022-08-20 – 2022-08-21 (×2): 25 mg via ORAL
  Filled 2022-08-20 (×2): qty 1

## 2022-08-20 MED ORDER — HYDRALAZINE HCL 50 MG PO TABS
50.0000 mg | ORAL_TABLET | Freq: Four times a day (QID) | ORAL | Status: DC | PRN
  Administered 2022-08-20 – 2022-08-21 (×2): 50 mg via ORAL
  Filled 2022-08-20 (×3): qty 1

## 2022-08-20 MED ORDER — VITAMIN B-12 1000 MCG PO TABS: 1000.0000 ug | ORAL_TABLET | Freq: Every day | ORAL | Status: DC

## 2022-08-20 MED ORDER — HYDRALAZINE HCL 20 MG/ML IJ SOLN: 10.0000 mg | Freq: Four times a day (QID) | INTRAMUSCULAR | Status: DC | PRN

## 2022-08-20 MED ORDER — CYANOCOBALAMIN 1000 MCG/ML IJ SOLN
1000.0000 ug | Freq: Every day | INTRAMUSCULAR | Status: DC
  Administered 2022-08-20 – 2022-08-24 (×5): 1000 ug via INTRAMUSCULAR
  Filled 2022-08-20 (×5): qty 1

## 2022-08-20 NOTE — Progress Notes (Signed)
OT Cancellation Note  Patient Details Name: [name] MRN: [id number] DOB: 1951-03-28   Cancelled Treatment:    Reason Eval/Treat Not Completed: Patient at procedure or test/ unavailable. Pt currently off the unit for testing. OT will re-attempt when pt is next available.   Darleen Crocker, [place], OTR/L , CBIS ascom [phone]  08/20/22, 2:49 PM

## 2022-08-20 NOTE — NC FL2 (Signed)
[place] LEVEL OF CARE FORM     IDENTIFICATION  Patient Name: [name] Birthdate: 1951/02/18 Sex: female Admission Date (Current Location): 08/18/2022  [facility] and [place] Number:  Engineering geologist and Address:  [facility], [address] [place], [place] [address]      Provider Number: [id number]  Attending Physician Name and Address:  Val Riles, MD  Relative Name and Phone Number:  Jenny Reichmann) [phone]    Current Level of Care: Hospital Recommended Level of Care: [place] Prior Approval Number:    Date Approved/Denied:   PASRR Number: [id number]  Discharge Plan: SNF    Current Diagnoses: Patient Active Problem List   Diagnosis Date Noted   Acute CVA (cerebrovascular accident) ([place]) 08/18/2022   HFrEF (heart failure with reduced ejection fraction) ([place]) 08/18/2022   Carotid stenosis 08/11/2022   PAD (peripheral artery disease) ([place]) 08/11/2022   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 06/24/2022   Weakness of right upper extremity 05/20/2022   CVA (cerebral vascular accident) ([place]) 05/20/2022   Hypoglycemia 04/07/2022   Abnormal EKG 04/07/2022   Ischemic pain of left foot    Ischemic foot 07/15/2019   Healthcare maintenance 11/30/2018   Amaurosis fugax of right eye 12/14/2017   Bilateral leg edema 09/28/2017   Hyperlipidemia, mixed 09/28/2017   Anemia 09/01/2017   Chronic insomnia 01/26/2017   Postmenopausal 08/05/2016   CKD (chronic kidney disease) stage 4, GFR 15-29 ml/min (HCC) 10/16/2014   Hypothyroidism 08/06/2014   Type 2 diabetes mellitus without complication ([place]) [id number]   Feces contents abnormal 11/07/2013   HYPERTENSION, BENIGN 11/10/2008   CAD, ARTERY BYPASS GRAFT 11/10/2008    Orientation RESPIRATION BLADDER Height & Weight     Self, Place  Normal Continent Weight: 169 lb (76.7 kg) Height:  '5\' 6"'$  (167.6 cm)  BEHAVIORAL  SYMPTOMS/MOOD NEUROLOGICAL BOWEL NUTRITION STATUS      Continent Diet (regular)  AMBULATORY STATUS COMMUNICATION OF NEEDS Skin   Extensive Assist Verbally Normal                       Personal Care Assistance Level of Assistance  Bathing, Dressing, Feeding Bathing Assistance: Maximum assistance Feeding assistance: Maximum assistance Dressing Assistance: Maximum assistance     Functional Limitations Info  Sight, Hearing, Speech Sight Info: Adequate Hearing Info: Adequate Speech Info: Adequate    SPECIAL CARE FACTORS FREQUENCY  PT (By licensed PT), OT (By licensed OT), Speech therapy     PT Frequency: 5 times a week OT Frequency: 5 times a week     Speech Therapy Frequency: 5 times a week      Contractures Contractures Info: Not present    Additional Factors Info  Code Status, Allergies Code Status Info: FULL Allergies Info: Niacin  Tramadol  Ultram (Tramadol Hcl)  Baclofen  Cardura (Doxazosin Mesylate)  Doxazosin  Morphine And Related  Other           Current Medications (08/20/2022):  This is the current hospital active medication list Current Facility-Administered Medications  Medication Dose Route Frequency Provider Last Rate Last Admin   acetaminophen (TYLENOL) tablet 650 mg  650 mg Oral Q4H PRN Wynelle Cleveland, RPH   650 mg at 08/20/22 0754   Or   acetaminophen (TYLENOL) 160 MG/5ML solution 650 mg  650 mg Per Tube Q4H PRN Wynelle Cleveland, RPH       Or   acetaminophen (TYLENOL) suppository  650 mg  650 mg Rectal Q4H PRN Wynelle Cleveland, RPH       allopurinol (ZYLOPRIM) tablet 100 mg  100 mg Oral Daily Jose Persia, MD   100 mg at 08/20/22 0856   aspirin EC tablet 81 mg  81 mg Oral Daily Jose Persia, MD   81 mg at 08/20/22 0856   atorvastatin (LIPITOR) tablet 80 mg  80 mg Oral QHS Derek Jack, MD   80 mg at 08/19/22 2125   calcitRIOL (ROCALTROL) capsule 0.25 mcg  0.25 mcg Oral Daily Jose Persia, MD   0.25 mcg at 08/20/22 0856    calcium carbonate (TUMS - dosed in mg elemental calcium) chewable tablet 200 mg of elemental calcium  1 tablet Oral TID Val Riles, MD   200 mg of elemental calcium at 08/20/22 0856   clopidogrel (PLAVIX) tablet 75 mg  75 mg Oral Daily Jose Persia, MD   75 mg at 08/20/22 [id number]   cyanocobalamin (VITAMIN B12) injection 1,000 mcg  1,000 mcg Intramuscular Daily Val Riles, MD   1,000 mcg at 08/20/22 1225   Followed by   Derrill Memo ON 08/27/2022] cyanocobalamin (VITAMIN B12) tablet 1,000 mcg  1,000 mcg Oral Daily Val Riles, MD       enoxaparin (LOVENOX) injection 30 mg  30 mg Subcutaneous QHS Jose Persia, MD   30 mg at 08/19/22 2125   feeding supplement (ENSURE ENLIVE / ENSURE PLUS) liquid 237 mL  237 mL Oral TID BM Val Riles, MD   237 mL at 08/20/22 0857   hydrALAZINE (APRESOLINE) injection 10 mg  10 mg Intravenous Q6H PRN Val Riles, MD       Or   hydrALAZINE (APRESOLINE) tablet 50 mg  50 mg Oral Q6H PRN Val Riles, MD   50 mg at 08/20/22 1229   insulin aspart (novoLOG) injection 0-15 Units  0-15 Units Subcutaneous TID WC Jose Persia, MD   5 Units at 08/20/22 1153   levothyroxine (SYNTHROID) tablet 200 mcg  200 mcg Oral Q M,W,F Jose Persia, MD   200 mcg at 08/19/22 0735   losartan (COZAAR) tablet 25 mg  25 mg Oral Daily Val Riles, MD   25 mg at 08/20/22 1153   multivitamin with minerals tablet 1 tablet  1 tablet Oral BID Val Riles, MD   1 tablet at 08/20/22 0856   ondansetron (ZOFRAN) injection 4 mg  4 mg Intravenous Q6H PRN Val Riles, MD   4 mg at 08/20/22 [id number]   oxyCODONE (Oxy IR/ROXICODONE) immediate release tablet 5 mg  5 mg Oral Q6H PRN Val Riles, MD   5 mg at 08/19/22 1406   pantoprazole (PROTONIX) EC tablet 20 mg  20 mg Oral Daily Jose Persia, MD   20 mg at 08/20/22 0856   senna-docusate (Senokot-S) tablet 1 tablet  1 tablet Oral QHS PRN Jose Persia, MD       sodium bicarbonate tablet 650 mg  650 mg Oral TID Val Riles, MD   650 mg at  08/20/22 1153     Discharge Medications: Please see discharge summary for a list of discharge medications.  Relevant Imaging Results:  Relevant Lab Results:   Additional Information [id number]  Gerilyn [place], LCSW

## 2022-08-20 NOTE — Progress Notes (Signed)
[facility] Progress Note  Patient: [name]    [id number]  DOA: 08/18/2022     Date of Service: the patient was seen and examined on 08/20/2022  Chief Complaint  Patient presents with   Aphasia   Brief hospital course: [name] is a 72 y.o. female with medical history significant of prior CVA on aspirin + Brilinta, hypertension, hyperlipidemia, hypothyroidism, HFrEF with HCM, CKD stage IV, type 2 diabetes, CAD, PAD, who presents to the ED due to difficulty speaking.  History limited due to aphasia.  [name] is unable to quantify when her symptoms began.  She is able to express difficulty with her speech and vision.  She describes seeing 2 different images from each eye and feels that they are not seeing together. Per chart review, patient has a friend that checks on her daily. On 3/11 at approximately 1 PM, patient was able to speak without any difficulty.  When her friend called her today in the morning, patient was noted to have difficulty with her speech.  Due to this, EMS was called.   ED course: On arrival to the ED, patient was normotensive at 137/68 with heart rate of 70.  She was saturating at 100% on room air. Initial workup notable for WBC of 6.9, hemoglobin of 9.3, glucose of 219, bicarb 20, BUN 38, creatinine 2.0, with GFR of 26.  Alcohol level negative.  INR within normal limits at 1.2.  CT of the head was obtained that demonstrated a large region of parenchymal hypodensity in the left parietal occipital lobe consistent with acute or subacute infarction.  No hemorrhage or midline shift.  Neurology consulted.  [facility] contacted for admission.    Assessment and Plan:  Acute CVA left PCA territory Prior history of CVA with left-sided carotid artery disease.  Holter monitor negative for arrhythmia.  Patient underwent carotid angiography in December 2024 that demonstrated greater than 70% stenosis in the distal common carotid artery and greater than 70% stenosis at the  origin of the left internal carotid artery.  Per medication reconciliation, patient's last refill of Brilinta was in early February 2024 with only 15-day supply.  CT evidence of a large left parietal occipital infarct  MRI brain:  Large acute infarct of the left PCA territory. No acute hemorrhage or mass effect. Abnormal left ICA flow void, consistent with occlusion or severe stenosis. Multiple old small vessel infarcts of the deep white matter and cerebellum. Telemetry monitoring Allow for permissive HTN (systolic < [id number] and diastolic < 123456) Continue Aspirin 81 mg pod, started Plavix 75 mg po daily and Lipitor 80 mg p.o. daily Continue neurocheck as per protocol PT/OT/SLP eval Neurology consulted TTE LVEF 35 to 40%, global hypokinesis with severe hypokinesis of inferior/posterior, inferior lateral and apical wall.  Moderate asymmetric LV hypertrophy, severe hypertrophy of basal-septal segments.  Grade 1 diastolic dysfunction. LA mild to moderate dilated, moderate MR, negative PFO.    Left carotid stenosis Vascular surgery consulted, recommended no intervention at this time due to reperfusion hemorrhage Follow-up with vascular surgery in 2 weeks for possible intervention on her carotid stenosis.   Hypertension S/p permissive hypertension due to acute CVA for 48 hrs Held home medications clonidine 0.1 mg twice daily, Lasix 40 mg p.o. daily, losartan 25 mg p.o. daily 3/14 started losartan 25 mg p.o. daily with holding parameters. We will monitor BP and resume home meds as needed We will use hydralazine p.o. versus IV according to parameters.   Hypothyroid, continue Synthroid  CKD stage IV, baseline creatinine is around 2 Continue to monitor renal functions and urine output Mild acidosis, bicarb 19, started oral bicarbonate monitor and replete as needed.   Diabetes mellitus type 2, recent A1c 6.9, well-controlled Currently patient is not on any medication, continue diabetic diet Hb A1c  7.7 well controlled Continue NovoLog sliding scale, monitor CBG  Vitamin B12 level 232, goal >400, started vitamin B12 1000 mcg IM injection during hospital stay followed by oral supplement. Anemia of chronic disease, vitamin B12 level at lower end, iron and folate within normal range.   Body mass index is 27.28 kg/m.  Nutrition Problem: Moderate Malnutrition Etiology: chronic illness (CHF) Interventions: Interventions: Ensure Enlive (each supplement provides 350kcal and 20 grams of protein), MVI, Liberalize Diet  Diet: Regular diet DVT Prophylaxis: Subcutaneous Lovenox   Advance goals of care discussion: Full code  Family Communication: family was not present at bedside, at the time of interview.  The pt provided permission to discuss medical plan with the family. Opportunity was given to ask question and all questions were answered satisfactorily.   Disposition:  Pt is from Home, admitted with Acute CVA, still has diff in speech and left eye diplopia, which precludes a safe discharge. Discharge to acute inpatient rehab versus SNF.  Stable to discharge when bed will be available.  TOC is following for placement   Subjective: No significant events overnight, patient was complaining of headache 7/10, still having expressive aphasia.  Patient has diplopia in the left eye, right eye is normal.  No new neurological changes, no any focal deficits in the upper and lower extremities.  Denies any chest pain or palpitation, no shortness of breath.   Physical Exam: General: NAD, lying comfortably Appear in no distress, affect appropriate Eyes: PERRLA, left eye diplopia ENT: Oral Mucosa Clear, moist  Neck: no JVD,  Cardiovascular: S1 and S2 Present, no Murmur,  Respiratory: good respiratory effort, Bilateral Air entry equal and Decreased, no Crackles, no wheezes Abdomen: Bowel Sound present, Soft and no tenderness,  Skin: no rashes Extremities: no Pedal edema, no calf  tenderness Neurologic: Expressive aphasia, left eye diplopia, no focal deficits.   Gait not checked due to patient safety concerns  Vitals:   08/19/22 1937 08/19/22 2350 08/20/22 0326 08/20/22 0744  BP: (!) 155/69 (!) 161/83 (!) 176/71 (!) 179/86  Pulse: 73 85 87 85  Resp: '15 15 17 17  '$ Temp: 98.3 F (36.8 C) 98.2 F (36.8 C) 98.4 F (36.9 C) 98.7 F (37.1 C)  TempSrc:  Axillary  Oral  SpO2: 100% 98% 100% 99%  Weight:      Height:        Intake/Output Summary (Last 24 hours) at 08/20/2022 1133 Last data filed at 08/20/2022 1028 Gross per 24 hour  Intake 0 ml  Output --  Net 0 ml   Filed Weights   08/18/22 1529  Weight: 76.7 kg    Data Reviewed: I have personally reviewed and interpreted daily labs, tele strips, imagings as discussed above. I reviewed all nursing notes, pharmacy notes, vitals, pertinent old records I have discussed plan of care as described above with RN and patient/family.  CBC: Recent Labs  Lab 08/18/22 1541 08/20/22 0752  WBC 6.9 8.3  NEUTROABS 5.3  --   HGB 9.3* 9.3*  HCT 32.9* 31.9*  MCV 84.6 82.0  PLT 301 [id number]   Basic Metabolic Panel: Recent Labs  Lab 08/18/22 1541 08/19/22 1908 08/20/22 0752  NA 138 144 139  K 4.2  3.9 4.2  CL 109 112* 113*  CO2 20* 21* 19*  GLUCOSE 219* 105* 182*  BUN 38* 33* 34*  CREATININE 2.00* 1.84* 1.73*  CALCIUM 8.6* 8.7* 8.8*  MG  --  2.3 2.5*  PHOS  --   --  3.9    Studies: ECHOCARDIOGRAM COMPLETE  Result Date: 08/20/2022    ECHOCARDIOGRAM REPORT   Patient Name:   JAALAH BLESSINGER Date of Exam: 08/19/2022 Medical Rec #:  [id number]      Height:       66.0 in Accession #:    [id number]     Weight:       169.0 lb Date of Birth:  1951/05/29      BSA:          1.862 m Patient Age:    53 years       BP:           140/88 mmHg Patient Gender: F              HR:           70 bpm. Exam Location:  [facility] Procedure: 2D Echo, Cardiac Doppler and Color Doppler Indications:     I63.9 Stroke  History:         Patient has  prior history of Echocardiogram examinations, most                  recent 05/20/2022. CHF, Previous Myocardial Infarction and CAD,                  CVA, Signs/Symptoms:Dyspnea; Risk Factors:Hypertension,                  Diabetes, Dyslipidemia and Sleep Apnea. Chronic kidney disease.  Sonographer:     Cresenciano Lick RDCS Referring Phys:  [id number] Val Riles Diagnosing Phys: Ida Rogue MD IMPRESSIONS  1. Left ventricular ejection fraction, by estimation, is 35 to 40%. The left ventricle has moderately decreased function. The left ventricle demonstrates mild global hypokinesis with severe hypokinesis of the infero/posterior, inferolateral and apical wall.  2. There is moderate asymmetric left ventricular hypertrophy of the septal wall, severe hypertrophy of the basal-septal segments. Left ventricular diastolic parameters are consistent with Grade I diastolic dysfunction (impaired relaxation).  3. Right ventricular systolic function is normal. The right ventricular size is normal.  4. Left atrial size was mild to moderately dilated.  5. The mitral valve is normal in structure. Mild to moderate mitral valve regurgitation. No evidence of mitral stenosis.  6. The aortic valve is normal in structure. There is mild calcification of the aortic valve. Aortic valve regurgitation is mild. Aortic valve sclerosis is present, with no evidence of aortic valve stenosis.  7. There is borderline dilatation of the ascending aorta, measuring 38 mm.  8. The inferior vena cava is normal in size with greater than 50% respiratory variability, suggesting right atrial pressure of 3 mmHg. FINDINGS  Left Ventricle: Left ventricular ejection fraction, by estimation, is 35 to 40%. The left ventricle has moderately decreased function. The left ventricle demonstrates global hypokinesis. The left ventricular internal cavity size was normal in size. There is moderate asymmetric left ventricular hypertrophy of the basal-septal and septal  segments. Left ventricular diastolic parameters are consistent with Grade I diastolic dysfunction (impaired relaxation). Right Ventricle: The right ventricular size is normal. No increase in right ventricular wall thickness. Right ventricular systolic function is normal. Left Atrium: Left atrial size was mild to moderately dilated. Right Atrium:  Right atrial size was normal in size. Pericardium: There is no evidence of pericardial effusion. Mitral Valve: The mitral valve is normal in structure. There is mild calcification of the mitral valve leaflet(s). Mild mitral annular calcification. Mild to moderate mitral valve regurgitation. No evidence of mitral valve stenosis. Tricuspid Valve: The tricuspid valve is normal in structure. Tricuspid valve regurgitation is not demonstrated. No evidence of tricuspid stenosis. Aortic Valve: The aortic valve is normal in structure. There is mild calcification of the aortic valve. Aortic valve regurgitation is mild. Aortic valve sclerosis is present, with no evidence of aortic valve stenosis. Pulmonic Valve: The pulmonic valve was normal in structure. Pulmonic valve regurgitation is not visualized. No evidence of pulmonic stenosis. Aorta: The aortic root is normal in size and structure. There is borderline dilatation of the ascending aorta, measuring 38 mm. Venous: The inferior vena cava is normal in size with greater than 50% respiratory variability, suggesting right atrial pressure of 3 mmHg. IAS/Shunts: No atrial level shunt detected by color flow Doppler.  LEFT VENTRICLE PLAX 2D LVIDd:         4.20 cm   Diastology LVIDs:         3.30 cm   LV e' medial:    3.92 cm/s LV PW:         1.70 cm   LV E/e' medial:  15.0 LV IVS:        1.70 cm   LV e' lateral:   5.60 cm/s LVOT diam:     2.10 cm   LV E/e' lateral: 10.5 LV SV:         71 LV SV Index:   38 LVOT Area:     3.46 cm  RIGHT VENTRICLE RV Basal diam:  4.00 cm RV S prime:     10.75 cm/s TAPSE (M-mode): 2.3 cm LEFT ATRIUM              Index        RIGHT ATRIUM           Index LA diam:        4.80 cm 2.58 cm/m   RA Area:     12.00 cm LA Vol (A2C):   72.9 ml 39.16 ml/m  RA Volume:   27.30 ml  14.66 ml/m LA Vol (A4C):   84.3 ml 45.28 ml/m LA Biplane Vol: 78.8 ml 42.33 ml/m  AORTIC VALVE LVOT Vmax:   92.97 cm/s LVOT Vmean:  61.833 cm/s LVOT VTI:    0.204 m  AORTA Ao Root diam: 3.70 cm Ao Asc diam:  3.80 cm MITRAL VALVE MV Area (PHT): 3.27 cm    SHUNTS MV Decel Time: 232 msec    Systemic VTI:  0.20 m MV E velocity: 58.70 cm/s  Systemic Diam: 2.10 cm MV A velocity: 95.90 cm/s MV E/A ratio:  0.61 Ida Rogue MD Electronically signed by Ida Rogue MD Signature Date/Time: 08/20/2022/11:30:50 AM    Final     Scheduled Meds:  allopurinol  100 mg Oral Daily   aspirin EC  81 mg Oral Daily   atorvastatin  80 mg Oral QHS   calcitRIOL  0.25 mcg Oral Daily   calcium carbonate  1 tablet Oral TID   clopidogrel  75 mg Oral Daily   enoxaparin (LOVENOX) injection  30 mg Subcutaneous QHS   feeding supplement  237 mL Oral TID BM   insulin aspart  0-15 Units Subcutaneous TID WC   levothyroxine  200 mcg Oral Q M,W,F  multivitamin with minerals  1 tablet Oral BID   pantoprazole  20 mg Oral Daily   Continuous Infusions: PRN Meds: acetaminophen **OR** acetaminophen (TYLENOL) oral liquid 160 mg/5 mL **OR** acetaminophen, ondansetron (ZOFRAN) IV, oxyCODONE, senna-docusate  Time spent: 50 minutes  Author: Val Riles. MD [facility] 08/20/2022 11:33 AM  To reach On-call, see care teams to locate the attending and reach out to them via [email or web address]. If 7PM-7AM, please contact night-coverage If you still have difficulty reaching the attending provider, please page the [facility] (Director on Call) for [facility] on amion for assistance.

## 2022-08-20 NOTE — Progress Notes (Signed)
   08/20/22 1300  Spiritual Encounters  Type of Visit Initial  Care provided to: Friend;Patient  Conversation partners present during encounter Nurse  Referral source Nurse (RN/NT/LPN)  Reason for visit Advance directives  OnCall Visit No  Spiritual Framework  Presenting Themes Community and relationships  Patient Stress Factors Loss of control;Major life changes;Financial concerns  Family Stress Factors Financial concerns;Major life changes  Goals  Self/Personal Goals Physical health (To get to rehab and get back to normal)  Interventions  Spiritual Care Interventions Made Established relationship of care and support;Compassionate presence;Reflective listening;Normalization of emotions  Intervention Outcomes  Outcomes Reduced anxiety;Reduced fear;Awareness of support  Spiritual Care Plan  Spiritual Care Issues Still Outstanding Chaplain will continue to follow   Receive call from nurse that patient was seeking help with Advance directive. Patient had friends in the room willing to be witnesses and take led in her recover. They sign and paper was notarized by Notary and paperwork file in patient file. Patient was stressing over medical AD not getting done and she left in the hands of unjust family members. She was able to relax a little after paperwork was properly handle.

## 2022-08-20 NOTE — Consult Note (Signed)
Consultation Note Date: 08/20/2022   Patient Name: [name]  DOB: 08/16/1950  MRN: [id number]  Age / Sex: 72 y.o., female  PCP: Kirk Ruths, MD Referring Physician: Val Riles, MD  Reason for Consultation: Establishing goals of care  HPI/Patient Profile: 72 y.o. female  with past medical history of CVA on aspirin and Brilinta, HTN/HLD, HFrEF w/HCM, CKD 4, DM 2, CAD, PAD, hypothyroid, admitted on 08/18/2022 with acute CVA left PCA territory.   Clinical Assessment and Goals of Care: I have reviewed medical records including EPIC notes, labs and imaging, received report from RN, assessed the patient.  Ms. Krolikowski is sitting up in the [place] chair in her room.  She appears acutely/chronically ill and somewhat frail.  She is alert, oriented to person and month (not 1, not to, but 3) but is unable to tell me that we are in the hospital (in a processing center).  She is able to make her basic needs known.  There is no family/friend at bedside at this time.  I ask what has happened to bring her to the hospital but she is unable to tell me.  She does tell me that she was never married, has no children.  She does tell me that she "took care of" 4 children but did not have any natural children.  She request that we reach out to her friend, [name].  We talk about going to short-term rehab, time for her to get better.  She is in agreement at this point.  Call to friend, [name], to discuss diagnosis prognosis, [place], EOL wishes, disposition and options.  I introduced Palliative Medicine as specialized medical care for people living with serious illness. It focuses on providing relief from the symptoms and stress of a serious illness. The goal is to improve quality of life for both the patient and the family.  We discussed a brief life review of the patient.  [name] shares that Numa is very independent.  She is  also very active in her own home.  Ruthann Cancer shared with me that she was unmarried and had no children.  She is estranged from her sister and at this point, is unable to tell me in which state her sister resides.  We then focused on their current illness.  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  She seems quite knowledgeable about [place]'s acute and chronic health concerns.  She is knowledgeable about her doctor's visit and test results.  The natural disease trajectory and expectations at EOL were discussed.  Advanced directives, concepts specific to code status, artifical feeding and hydration, and rehospitalization were not discussed with patient today due to mental status.  Longtime friend/healthcare surrogate, [name], shares that Mrs. Delia Chimes would not want extraordinary measures.  She feels that she would want DNR.  PMT to follow-up with [place] tomorrow.  Hopefully, Darria can verbally share the same wishes.  Discussed the importance of continued conversation with family and the medical providers regarding overall plan of care and treatment  options, ensuring decisions are within the context of the patient's values and GOCs.  Questions and concerns were addressed.   The family was encouraged to call with questions or concerns.  PMT will continue to support holistically.  Conference with attending, bedside nursing staff, transition of care team related to patient condition, needs, goals of care, disposition.   HCPOA OTHER -Mrs. Cozine names her longtime friend, [name] RN, as her healthcare surrogate.  Unfortunately, due to her mental status changes, Mrs. Martorana is unable to legally complete healthcare power of attorney paperwork today.  As her condition improves, she would be able to complete this paperwork.  The law does allow for friends as healthcare surrogate in the absence of family.  Legally [name] would be able to be surrogate decision maker without paperwork.    SUMMARY OF  RECOMMENDATIONS   At this point continue to treat the treatable Would benefit from short-term rehab/CIR Needs healthcare power of attorney paperwork completed and AD completed.   Code Status/Advance Care Planning: Full code  Symptom Management:  Per hospitalist, no additional needs at this time.  Palliative Prophylaxis:  Frequent Pain Assessment and Oral Care  Additional Recommendations (Limitations, Scope, Preferences): Full Scope Treatment  Psycho-social/Spiritual:  Desire for further Chaplaincy support:no Additional Recommendations: Caregiving  Support/Resources  Prognosis:  Unable to determine, based on outcomes.  Guarded at this point.  6 months or less would not be surprising based on recurrent strokes.  Discharge Planning: Anticipate short-term rehab versus CIR.      Primary Diagnoses: Present on Admission:  Acute CVA (cerebrovascular accident) ([place])  Hypothyroidism  HYPERTENSION, BENIGN  CKD (chronic kidney disease) stage 4, GFR 15-29 ml/min ([place])   I have reviewed the medical record, interviewed the patient and family, and examined the patient. The following aspects are pertinent.  Past Medical History:  Diagnosis Date   Acute CVA (cerebrovascular accident) ([place]) 12/18/2021   Acute gastric ulcer without hemorrhage or perforation    Acute on chronic systolic CHF (congestive heart failure), NYHA class 3 ([place]) 08/03/2019   Formatting of this note might be different from the original. Global ef 35%   Anemia    Anemia, iron deficiency 11/07/2013   Arthritis    CAD (coronary artery disease)    Cerebrovascular accident [facility])    CHF (congestive heart failure) ([place])    Chicken pox    Chronic kidney disease    Colitis    ? 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   Sleep apnea    Type 2 diabetes mellitus with diabetic peripheral angiopathy without gangrene, without long-term current use of insulin ([facility])    Social History   Socioeconomic History   Marital status: Single    Spouse name: Not on file   Number of children: 0   Years of education: Not on file   Highest education level: Not on file  Occupational History   Occupation: customer service  Employer: [employer]  Tobacco Use   Smoking status: Never   Smokeless tobacco: Never  Vaping Use   Vaping Use: Never used  Substance and Sexual Activity   Alcohol use: No   Drug use: No   Sexual activity: Not on file  Other Topics Concern   Not on file  Social History Narrative   Single, no children   Customer service   1 caffeine/day   Social Determinants of Health   Financial Resource Strain: Not on file  Food Insecurity: No Food Insecurity (08/19/2022)   Hunger Vital Sign    Worried About Running Out of Food in the Last Year: Never true    Ran Out of Food in the Last Year: Never true  Transportation Needs: No Transportation Needs (08/19/2022)   PRAPARE - Hydrologist (Medical): No    Lack of Transportation (Non-Medical): No  Physical Activity: Not on file  Stress: Not on file  Social Connections: Not on file   Family History  Problem Relation Age of Onset   Coronary artery disease Other    Stroke Maternal Grandmother    Diabetes Paternal Grandmother        diabetic coma   Heart attack Father    Heart attack Mother    Heart attack Maternal Grandfather    Heart attack Paternal Grandfather    Breast cancer Neg Hx    Scheduled Meds:  allopurinol  100 mg Oral Daily    aspirin EC  81 mg Oral Daily   atorvastatin  80 mg Oral QHS   calcitRIOL  0.25 mcg Oral Daily   calcium carbonate  1 tablet Oral TID   clopidogrel  75 mg Oral Daily   cyanocobalamin  1,000 mcg Intramuscular Daily   Followed by   Derrill Memo ON 08/27/2022] vitamin B-12  1,000 mcg Oral Daily   enoxaparin (LOVENOX) injection  30 mg Subcutaneous QHS   feeding supplement  237 mL Oral TID BM   insulin aspart  0-15 Units Subcutaneous TID WC   levothyroxine  200 mcg Oral Q M,W,F   losartan  25 mg Oral Daily   multivitamin with minerals  1 tablet Oral BID   pantoprazole  20 mg Oral Daily   sodium bicarbonate  650 mg Oral TID   Continuous Infusions: PRN Meds:.acetaminophen **OR** acetaminophen (TYLENOL) oral liquid 160 mg/5 mL **OR** acetaminophen, hydrALAZINE **OR** hydrALAZINE, ondansetron (ZOFRAN) IV, oxyCODONE, senna-docusate Medications Prior to Admission:  Prior to Admission medications   Medication Sig Start Date End Date Taking? Authorizing Provider  allopurinol (ZYLOPRIM) 100 MG tablet Take 100 mg by mouth daily.   Yes [provider]  aspirin EC 81 MG tablet Take 1 tablet (81 mg total) by mouth daily. 05/21/22  Yes Fritzi Mandes, MD  atorvastatin (LIPITOR) 40 MG tablet Take 1 tablet (40 mg total) by mouth at bedtime. 05/21/22  Yes Fritzi Mandes, MD  calcitRIOL (ROCALTROL) 0.25 MCG capsule Take 0.25 mcg by mouth daily.   Yes [provider]  cloNIDine (CATAPRES) 0.1 MG tablet Take 0.1 mg by mouth 2 (two) times daily.   Yes [provider]  diphenhydrAMINE (BENADRYL) 50 MG capsule Take 50 mg by mouth at bedtime as needed for sleep.   Yes [provider]  furosemide (LASIX) 40 MG tablet Take 40 mg by mouth daily.   Yes [provider]  levothyroxine (SYNTHROID) 200 MCG tablet Take 200 mcg by mouth every Monday, Wednesday, and Friday.   Yes [provider]  losartan (COZAAR) 25 MG tablet Take 25 mg by mouth daily.   Yes [provider]   Multiple Vitamin (MULTIVITAMIN WITH MINERALS) TABS tablet Take 1 tablet by mouth daily.   Yes [provider]  pantoprazole (PROTONIX) 20 MG tablet Take 20 mg by mouth daily.   Yes [provider]  potassium chloride (KLOR-CON) 10 MEQ tablet Take 20 mEq by mouth daily.   Yes [provider]  ticagrelor (BRILINTA) 90 MG TABS tablet Take 1 tablet (90 mg total) by mouth 2 (two) times daily. 05/21/22  Yes Fritzi Mandes, MD  blood glucose meter kit and supplies KIT Dispense based on patient and insurance preference. Use up to four times daily as directed. 04/09/22   Ezekiel Slocumb, DO   Allergies  Allergen Reactions   Niacin Other (See Comments)    Heart Attack Other reaction(s): Other (see comments) Heart Attack   Tramadol     Other reaction(s): Other (see comments) Intolerance, makes woozy   Ultram [Tramadol Hcl]     Intolerance, makes woozy   Baclofen Other (See Comments)    Dizziness Other reaction(s): Other (see comments) Dizziness   Cardura [Doxazosin Mesylate] Other (See Comments)    Dizzy with passing out spells   Doxazosin     Other reaction(s): Other (see comments) Dizzy with passing out spells Dizzy with passing out spells   Morphine And Related Nausea And Vomiting and Nausea Only   Other Rash    Gold metal (nickel in the gold) Gold metal Gold metal (nickel in the gold)   Review of Systems  Unable to perform ROS: Acuity of condition    Physical Exam Vitals and nursing note reviewed.  Constitutional:      General: She is not in acute distress.    Appearance: She is ill-appearing.  HENT:     Mouth/Throat:     Mouth: Mucous membranes are moist.  Cardiovascular:     Rate and Rhythm: Normal rate.  Pulmonary:     Effort: Pulmonary effort is normal. No respiratory distress.  Skin:    General: Skin is warm and dry.  Neurological:     Mental Status: She is alert.     Comments: Oriented to person and month given time, not location   Psychiatric:        Mood and Affect: Mood normal.        Behavior: Behavior normal.     Comments: Calm and cooperative, not fearful     Vital Signs: BP (!) 168/67 (BP Location: Right Arm)   Pulse 65   Temp 98.1 F (36.7 C) (Oral)   Resp 16   Ht '5\' 6"'$  (1.676 m)   Wt 76.7 kg   SpO2 100%   BMI 27.28 kg/m  Pain Scale: Faces   Pain Score: Asleep   SpO2: SpO2: 100 % O2 Device:SpO2: 100 % O2 Flow Rate: .   IO: Intake/output summary:  Intake/Output Summary (Last 24 hours) at 08/20/2022 1218 Last data filed at 08/20/2022 1028 Gross per 24 hour  Intake 0 ml  Output --  Net 0 ml    LBM: Last BM Date : 08/18/22 Baseline Weight: Weight: 76.7 kg Most recent weight: Weight: 76.7 kg     Palliative Assessment/Data:     Time In: 0940 Time Out: 1055 Time Total: 75 minutes  Greater than 50%  of this time was spent counseling and coordinating care related to the above assessment and plan.  Signed by: Drue Novel, NP  Please contact Palliative Medicine Team phone at [phone] for questions and concerns.  For individual provider: See Shea Evans

## 2022-08-20 NOTE — Progress Notes (Signed)
Two 14-day Zio monitors ordered for patient at providers request  Pt needs 30 day heart monitor post-stroke. Please and thank you

## 2022-08-20 NOTE — Progress Notes (Signed)
  Inpatient Rehabilitation Admissions Coordinator   I spoke with patient's friend, [name], by phone. I verified the same lack of caregiver supports that were discussed with TOC SW, Darrian, yesterday. I do not feel patient can reach Mod I level in a CIR admit to return home alone therefore I recommend SNF rehab for prolonged rehab recovery. [name] agrees that it is needed, but is concerned that patient will refuse. I have alerted acute team and TOC. We will sign off. Please call me with any questions.   Danne Baxter, RN, MSN Rehab Admissions Coordinator [phone]

## 2022-08-20 NOTE — Progress Notes (Signed)
Speech Language Pathology Treatment: Cognitive-Linquistic  Patient Details Name: [name] MRN: [id number] DOB: October 03, 1950 Today's Date: 08/20/2022 Time: [id number] SLP Time Calculation (min) (ACUTE ONLY): 15 min  Assessment / Plan / Recommendation Clinical Impression  Pt seen today for cognitive-linguistic treatment. Pt laying on left side in bed for duration of tx. Pt expressing she was feeling "tired" but was agreeable to tx. Pt alert, cooperative, and pleasant t/o tx. Pt left laying in bed w/ call button in reach and bed alarm set.  Pt participated in following directions, naming objects/function, and answering orientation question for tx session. Pt independently oriented to place [facility]") and accurately oriented to year and city give binary choices and min verbal cues. Pt followed directions in 57% trials independently, increasing to 100% given mod models and visual/verbal/tactile cues. Given binary choices, pt accurately identified common household objects w/ 80% accurately independently. Pt often independently demonstrated function of object w/o being prompted. Pt accurately verbally identified object function w/ 80% independently given binary choices. Repetition was used to reinforce object names and function. Binary choices, repetitions, visual/verbal/tactile cues, and models all continue to be helpful in supporting pt's communication.  During conversational speech, pt continues to exhibit circumlocutions and perseverations which significantly impact pt's message.  Acute ST services will continue w/ current POC.    HPI HPI: Per H&P, "is a 72 y.o. female with medical history significant of prior CVA on aspirin + Brilinta, hypertension, hyperlipidemia, hypothyroidism, HFrEF with HCM, CKD stage IV, type 2 diabetes, CAD, PAD, who presents to the ED due to difficulty speaking.  History limited due to aphasia.     [name] is unable to quantify when her symptoms began.  She is  able to express difficulty with her speech and vision.  She describes seeing 2 different images from each eye and feels that they are not seeing together.  She denies any pain, difficulty breathing, focal weakness or lower extremity swelling.  She states aspirin was last taken this morning and she believes Brilinta was last taken either yesterday or the day before.     Per chart review, patient has a friend that checks on her daily.  Yesterday at approximately 1 PM, patient was able to speak without any difficulty.  When her friend called her today in the morning, patient was noted to have difficulty with her speech.  Due to this, EMS was called." Per Brain MRI, pt w/ "1. Large acute infarct of the left PCA territory. No acute  hemorrhage or mass effect.  2. Abnormal left ICA flow void, consistent with occlusion or severe  stenosis.  3. 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  Recommendations may be updated based on patient status, additional functional criteria and insurance authorization.    Recommendations                   Follow Up Recommendations: Follow physician's recommendations for discharge plan and follow up therapies Assistance recommended at discharge: Frequent or constant Supervision/Assistance SLP Visit Diagnosis: Aphasia (R47.01) Plan: Continue with current plan of care         Randall Hiss Graduate Clinician [place], Speech Pathology   Randall Hiss  08/20/2022, 3:11 PM

## 2022-08-20 NOTE — Progress Notes (Signed)
Physical Therapy Treatment Patient Details Name: [name] MRN: [id number] DOB: 1950/10/13 Today's Date: 08/20/2022   History of Present Illness 72 y.o. female with medical history significant of prior CVA on aspirin + Brilinta, hypertension, hyperlipidemia, hypothyroidism, HFrEF with HCM, CKD stage IV, type 2 diabetes, CAD, PAD, who presents to the ED due to difficulty speaking and acute vision changes. MRI significant for large acute L PCA infarct.    PT Comments    Improved alertness, activity tolerance and overall participation this date (headache improved).  Demonstrates much more fluent spontaneous speech, but continued difficulty with on-demand/confrontational speech. Able to complete all functional mobility tasks and gait (50') with RW, cga/min assist.  Demonstrates reciprocal stepping with fair height/length, fair cadence; dynamic balance and functional endurance deficits; consistent cuing for visual scanning to R of midline   Extensive education for R visual scanning to locate items, scan environment. Patient voices understanding and demonstrates immediate integration, but question ability to carry-over between activities/sessions.    Recommendations for follow up therapy are one component of a multi-disciplinary discharge planning process, led by the attending physician.  Recommendations may be updated based on patient status, additional functional criteria and insurance authorization.  Follow Up Recommendations  Acute inpatient rehab (3hours/day)     Assistance Recommended at Discharge Frequent or constant Supervision/Assistance  Patient can return home with the following A lot of help with walking and/or transfers;A lot of help with bathing/dressing/bathroom   Equipment Recommendations       Recommendations for Other Services       Precautions / Restrictions Precautions Precautions: Fall Restrictions Weight Bearing Restrictions: No     Mobility  Bed  Mobility Overal bed mobility: Needs Assistance Bed Mobility: Supine to Sit     Supine to sit: Min assist          Transfers Overall transfer level: Needs assistance Equipment used: Rolling walker (2 wheels) Transfers: Sit to/from Stand, Bed to chair/wheelchair/BSC Sit to Stand: Min guard, Min assist Stand pivot transfers: Min guard, Min assist              Ambulation/Gait Ambulation/Gait assistance: Min guard, Min assist Gait Distance (Feet): 50 Feet Assistive device: Rolling walker (2 wheels)         General Gait Details: reciprocal stepping with fair height/length, fair cadence; dynamic balance and functional endurance deficits; consistent cuing for visual scanning to R of midline   Stairs             Wheelchair Mobility    Modified Rankin (Stroke Patients Only)       Balance Overall balance assessment: Needs assistance Sitting-balance support: No upper extremity supported, Feet supported Sitting balance-Leahy Scale: Good     Standing balance support: Bilateral upper extremity supported Standing balance-Leahy Scale: Fair                              Cognition Arousal/Alertness: Awake/alert Behavior During Therapy: Flat affect Overall Cognitive Status: Impaired/Different from baseline                                 General Comments: some emotional lability when discussing support network at discharge; provided with active listening/support, easily redirected        Exercises Other Exercises Other Exercises: Toilet transfer, SPT with grab bars, cga/min assist; sit/stand from standard toilet with grab bar, cga/min assist; standing balance for  clothing management, cga/min assist.  does require UE support for balance/safety Other Exercises: Static/dynamic standing at sink to participate with grooming and oral care activities, using UEs on counter for external stabilization throughout, cga/close sup.  Fair/good functional  use of R UE with tasks (mild decrease in fine motor/coordination); max cuing for visual scanning/awareness of items to R of midline.    General Comments        Pertinent Vitals/Pain Pain Assessment Pain Assessment: Faces Faces Pain Scale: Hurts a little bit Pain Location: headache Pain Descriptors / Indicators: Grimacing Pain Intervention(s): Limited activity within patient's tolerance, Monitored during session, Repositioned    Home Living                          Prior Function            PT Goals (current goals can now be found in the care plan section) Acute Rehab PT Goals Patient Stated Goal: to get back home PT Goal Formulation: With patient Time For Goal Achievement: 09/02/22 Potential to Achieve Goals: Good Progress towards PT goals: Progressing toward goals    Frequency    7X/week      PT Plan Current plan remains appropriate    Co-evaluation              AM-PAC PT "6 Clicks" Mobility   Outcome Measure  Help needed turning from your back to your side while in a flat bed without using bedrails?: None Help needed moving from lying on your back to sitting on the side of a flat bed without using bedrails?: A Little Help needed moving to and from a bed to a chair (including a wheelchair)?: A Little Help needed standing up from a chair using your arms (e.g., wheelchair or bedside chair)?: A Little Help needed to walk in hospital room?: A Little Help needed climbing 3-5 steps with a railing? : A Lot 6 Click Score: 18    End of Session Equipment Utilized During Treatment: Gait belt Activity Tolerance: Patient tolerated treatment well Patient left: with call bell/phone within reach;in chair;with chair alarm set Nurse Communication: Mobility status PT Visit Diagnosis: Difficulty in walking, not elsewhere classified (R26.2);Hemiplegia and hemiparesis Hemiplegia - Right/Left: Right Hemiplegia - dominant/non-dominant: Dominant Hemiplegia - caused  by: Cerebral infarction     Time: [id number] PT Time Calculation (min) (ACUTE ONLY): 32 min  Charges:  $Gait Training: 8-22 mins $Therapeutic Activity: 8-22 mins                     Darral Rishel H. Owens Shark, PT, DPT, NCS 08/20/22, 4:27 PM [phone]

## 2022-08-21 ENCOUNTER — Other Ambulatory Visit (HOSPITAL_COMMUNITY): Payer: Self-pay

## 2022-08-21 ENCOUNTER — Telehealth (HOSPITAL_COMMUNITY): Payer: Self-pay

## 2022-08-21 DIAGNOSIS — I639 Cerebral infarction, unspecified: Secondary | ICD-10-CM | POA: Diagnosis not present

## 2022-08-21 DIAGNOSIS — Z7189 Other specified counseling: Secondary | ICD-10-CM | POA: Diagnosis not present

## 2022-08-21 DIAGNOSIS — Z515 Encounter for palliative care: Secondary | ICD-10-CM | POA: Diagnosis not present

## 2022-08-21 LAB — BASIC METABOLIC PANEL
Anion gap: 12 (ref 5–15)
BUN: 34 mg/dL — ABNORMAL HIGH (ref 8–23)
CO2: 22 mmol/L (ref 22–32)
Calcium: 9 mg/dL (ref 8.9–10.3)
Chloride: 107 mmol/L (ref 98–111)
Creatinine, Ser: 1.7 mg/dL — ABNORMAL HIGH (ref 0.44–1.00)
GFR, Estimated: 31 mL/min — ABNORMAL LOW (ref >60)
Glucose, Bld: 173 mg/dL — ABNORMAL HIGH (ref 70–99)
Potassium: 4.2 mmol/L (ref 3.5–5.1)
Sodium: 141 mmol/L (ref 135–145)

## 2022-08-21 LAB — GLUCOSE, CAPILLARY
Glucose-Capillary: 156 mg/dL — ABNORMAL HIGH (ref 70–99)
Glucose-Capillary: 173 mg/dL — ABNORMAL HIGH (ref 70–99)
Glucose-Capillary: 171 mg/dL — ABNORMAL HIGH (ref 70–99)
Glucose-Capillary: 150 mg/dL — ABNORMAL HIGH (ref 70–99)

## 2022-08-21 LAB — PHOSPHORUS: Phosphorus: 2.8 mg/dL (ref 2.5–4.6)

## 2022-08-21 LAB — CBC
HCT: 31.1 % — ABNORMAL LOW (ref 36.0–46.0)
Hemoglobin: 9.1 g/dL — ABNORMAL LOW (ref 12.0–15.0)
MCH: 24.2 pg — ABNORMAL LOW (ref 26.0–34.0)
MCHC: 29.3 g/dL — ABNORMAL LOW (ref 30.0–36.0)
MCV: 82.7 fL (ref 80.0–100.0)
Platelets: 272 10*3/uL (ref 150–400)
RBC: 3.76 MIL/uL — ABNORMAL LOW (ref 3.87–5.11)
RDW: 19.4 % — ABNORMAL HIGH (ref 11.5–15.5)
WBC: 7.6 10*3/uL (ref 4.0–10.5)
nRBC: 0 % (ref 0.0–0.2)

## 2022-08-21 LAB — VITAMIN B1: Vitamin B1 (Thiamine): 126.7 nmol/L (ref 66.5–200.0)

## 2022-08-21 LAB — VITAMIN K1, SERUM: VITAMIN K1: <0.1 ng/mL — ABNORMAL LOW (ref 0.10–2.20)

## 2022-08-21 LAB — ZINC: Zinc: 41 ug/dL — ABNORMAL LOW (ref 44–115)

## 2022-08-21 LAB — COPPER, SERUM: Copper: 96 ug/dL (ref 80–158)

## 2022-08-21 LAB — MAGNESIUM: Magnesium: 2.3 mg/dL (ref 1.7–2.4)

## 2022-08-21 MED ORDER — PROSOURCE PLUS PO LIQD
30.0000 mL | Freq: Three times a day (TID) | ORAL | Status: DC
  Administered 2022-08-21 – 2022-08-24 (×9): 30 mL via ORAL
  Filled 2022-08-21: qty 30

## 2022-08-21 MED ORDER — BOOST / RESOURCE BREEZE PO LIQD CUSTOM
1.0000 | Freq: Three times a day (TID) | ORAL | Status: DC
  Administered 2022-08-21 – 2022-08-24 (×9): 1 via ORAL

## 2022-08-21 MED ORDER — ZINC SULFATE 220 (50 ZN) MG PO CAPS
220.0000 mg | ORAL_CAPSULE | Freq: Every day | ORAL | Status: DC
  Administered 2022-08-21 – 2022-08-24 (×4): 220 mg via ORAL
  Filled 2022-08-21 (×4): qty 1

## 2022-08-21 NOTE — Progress Notes (Signed)
Nutrition Follow-up  DOCUMENTATION CODES:   Non-severe (moderate) malnutrition in context of chronic illness  INTERVENTION:   -Continue with liberalized diet of regular -Continue MVI with minerals BID -Continue 500 mg calcium carbonate TID -D/c Ensure Enlive po TID, each supplement provides 350 kcal and 20 grams of protein -Boost Breeze po TID, each supplement provides 250 kcal and 9 grams of protein  -30 ml Prosource Plus TID, each supplement provides 100 kcals and 15 grams protein -Due to history of roux en y gastric bypass surgery and malnutrition, RD will draw and monitor labs to rule out possible micronutrient deficiencies: iron (WDL), folic acid (WDL), thiamine (pending), copper (WDL), zinc (41), vitamin B-12 (WDL), vitamin D (WDL), vitamin A (pending), vitamin E (pending), and vitamin K (< 1) -220 mg zinc sulfate daily x 14 days   NUTRITION DIAGNOSIS:   Moderate Malnutrition related to chronic illness (CHF) as evidenced by mild fat depletion, mild muscle depletion, moderate muscle depletion, percent weight loss.  Ongoing  GOAL:   Patient will meet greater than or equal to 90% of their needs  Progressing   MONITOR:   PO intake, Supplement acceptance  REASON FOR ASSESSMENT:   Malnutrition Screening Tool    ASSESSMENT:   Pt with medical history significant of prior CVA on aspirin + Brilinta, hypertension, hyperlipidemia, hypothyroidism, HFrEF with HCM, CKD stage IV, type 2 diabetes, CAD, PAD, who presents due to difficulty speaking.  History limited due to aphasia.  Reviewed I/O's: +220 ml x 24 hours   Case discussed with TOC. Pt friend reports pt with complex nutritional needs. Called and spoke with pt's friend [name]) per request. Pt with limited diet at baseline due to gastric bypass surgery and does not tolerate milk products well. Pt "grazes" throughout the day and preferred foods include eggs and cheese.   Spoke with pt at bedside. Pt shares that her appetite  is still poor. She consumed one bite of omelette and 75% of cranberry juice. Pt does not like the Ensure, stating it is "too heavy for her". Discussed importance of good meal and supplement intake to promote healing. Pt amenable to try Boost Breeze.   Per TOC notes, plan to discharge to SNF (may discharge over the weekend). Per CIR notes, pt does not qualify for CIR.   Medications reviewed and include vitamin B-12 and zinc sulfate.   Labs reviewed: Zinc: 41, Vitamin K <1. Iron, folic acid, copper, vitamin B-12, and vitamin D. Thiamine and vitamin A still pending. CBGS: 129-173 (inpatient orders for glycemic control are 0-15 units insulin aspart TID with meals).    Diet Order:   Diet Order             Diet regular Room service appropriate? Yes with Assist; Fluid consistency: Thin  Diet effective now                   EDUCATION NEEDS:   Education needs have been addressed  Skin:  Skin Assessment: Reviewed RN Assessment  Last BM:  08/18/22  Height:   Ht Readings from Last 1 Encounters:  08/18/22 5\' 6"  (1.676 m)    Weight:   Wt Readings from Last 1 Encounters:  08/18/22 76.7 kg    Ideal Body Weight:  59.1 kg  BMI:  Body mass index is 27.28 kg/m.  Estimated Nutritional Needs:   Kcal:  1850-2050  Protein:  90-105 grams  Fluid:  > 1.8 L    Loistine Chance, RD, LDN, [place]omi Registered Dietitian II Certified  Diabetes Care and Education Specialist Please refer to [facility] for RD and/or RD on-call/weekend/after hours pager

## 2022-08-21 NOTE — TOC Benefit Eligibility Note (Signed)
Patient Teacher, English as a foreign language completed.    The patient is currently admitted and upon discharge could be taking Clopidogrel.  The current 30 day co-pay is $0.00.   The patient is insured through [employer]

## 2022-08-21 NOTE — Progress Notes (Signed)
Occupational Therapy Treatment Patient Details Name: [name] MRN: [id number] DOB: 09-28-1950 Today's Date: 08/21/2022   History of present illness 72 y.o. female with medical history significant of prior CVA on aspirin + Brilinta, hypertension, hyperlipidemia, hypothyroidism, HFrEF with HCM, CKD stage IV, type 2 diabetes, CAD, PAD, who presents to the ED due to difficulty speaking and acute vision changes. MRI significant for large acute L PCA infarct.   OT comments  Upon entering the room, pt seated in recliner chair and asking to return to bed secondary to fatigue. Pt having already worked with PT this morning and has been sitting up in chair for ~ 30 minutes. OT asking pt to ambulates to bathroom with therapist for self care tasks. Pt stands with min A from recliner chair with RW and once more requesting to return to bed. Pt ambulating 10' with RW and min guard - min A around bed and then has B knee buckling and has to be assisted back into bed for safety. Pt returns to supine with min A and then closes her eyes immediately. Call bell and all needed items within reach upon exiting the room. Bed alarm activated and RN notified of barriers in session.    Recommendations for follow up therapy are one component of a multi-disciplinary discharge planning process, led by the attending physician.  Recommendations may be updated based on patient status, additional functional criteria and insurance authorization.    Follow Up Recommendations  Acute inpatient rehab (3hours/day)     Assistance Recommended at Discharge Frequent or constant Supervision/Assistance  Patient can return home with the following  A lot of help with walking and/or transfers;A lot of help with bathing/dressing/bathroom;Assistance with cooking/housework;Assist for transportation;Help with stairs or ramp for entrance;Direct supervision/assist for financial management;Direct supervision/assist for medications management    Equipment Recommendations  Other (comment) (defer to next venue of care)       Precautions / Restrictions Precautions Precautions: Fall Restrictions Weight Bearing Restrictions: No       Mobility Bed Mobility Overal bed mobility: Needs Assistance         Sit to supine: Mod assist   General bed mobility comments: Pt fatigued and requiring more assistance    Transfers Overall transfer level: Needs assistance Equipment used: Rolling walker (2 wheels) Transfers: Sit to/from Stand Sit to Stand: Min assist                 Balance Overall balance assessment: Needs assistance Sitting-balance support: No upper extremity supported, Feet supported Sitting balance-Leahy Scale: Good     Standing balance support: Bilateral upper extremity supported, Reliant on assistive device for balance Standing balance-Leahy Scale: Fair                             ADL either performed or assessed with clinical judgement      Cognition Arousal/Alertness: Awake/alert Behavior During Therapy: Flat affect Overall Cognitive Status: Impaired/Different from baseline                                                General Comments Discussed at length need for rehab at DC. Pt is very agreeable to motivated to improve    Pertinent Vitals/ Pain       Pain Assessment Pain Assessment: Faces Faces Pain Scale: Hurts little more  Pain Location: headache Pain Descriptors / Indicators: Discomfort Pain Intervention(s): Limited activity within patient's tolerance, Monitored during session, Repositioned         Frequency  Min 3X/week        Progress Toward Goals  OT Goals(current goals can now be found in the care plan section)  Progress towards OT goals: OT to reassess next treatment     Plan Discharge plan remains appropriate;Frequency remains appropriate       AM-PAC OT "6 Clicks" Daily Activity     Outcome Measure   Help from another person  eating meals?: A Little Help from another person taking care of personal grooming?: A Little Help from another person toileting, which includes using toliet, bedpan, or urinal?: A Lot Help from another person bathing (including washing, rinsing, drying)?: A Lot Help from another person to put on and taking off regular upper body clothing?: A Little Help from another person to put on and taking off regular lower body clothing?: A Lot 6 Click Score: 15    End of Session Equipment Utilized During Treatment: Rolling walker (2 wheels)  OT Visit Diagnosis: Unsteadiness on feet (R26.81);Repeated falls (R29.6);Muscle weakness (generalized) (M62.81)   Activity Tolerance Patient limited by fatigue   Patient Left in bed;with call bell/phone within reach;with bed alarm set   Nurse Communication Mobility status        Time: [id number] OT Time Calculation (min): 14 min  Charges: OT General Charges $OT Visit: 1 Visit OT Treatments $Therapeutic Activity: 8-22 mins  Darleen Crocker, MS, OTR/L , CBIS ascom [phone]  08/21/22, 11:56 AM

## 2022-08-21 NOTE — Telephone Encounter (Signed)
Pharmacy Patient Advocate Encounter  Insurance verification completed.    The patient is insured through [employer]   The patient is currently admitted and ran test claims for the following: Clopidogrel.  Copays and coinsurance results were relayed to Inpatient clinical team.

## 2022-08-21 NOTE — Progress Notes (Signed)
Manufacturing systems engineer Liaison Note  New referral received from [employer], SW [facility],  for palliative services to follow patient at [facility] upon discharge from the hospital.  Referral sent to [facility].  Team will initiate contact with family and patient upon discharge from [facility].  Thank you for allowing participation in this patient's care.  Dimas Aguas, RN [phone]

## 2022-08-21 NOTE — Progress Notes (Signed)
[facility] Progress Note  Patient: [name]    [id number]  DOA: 08/18/2022     Date of Service: the patient was seen and examined on 08/21/2022  Chief Complaint  Patient presents with   Aphasia   Brief hospital course: [name] is a 72 y.o. female with medical history significant of prior CVA on aspirin + Brilinta, hypertension, hyperlipidemia, hypothyroidism, HFrEF with HCM, CKD stage IV, type 2 diabetes, CAD, PAD, who presents to the ED due to difficulty speaking.  History limited due to aphasia.  [name] is unable to quantify when her symptoms began.  She is able to express difficulty with her speech and vision.  She describes seeing 2 different images from each eye and feels that they are not seeing together. Per chart review, patient has a friend that checks on her daily. On 3/11 at approximately 1 PM, patient was able to speak without any difficulty.  When her friend called her today in the morning, patient was noted to have difficulty with her speech.  Due to this, EMS was called.   ED course: On arrival to the ED, patient was normotensive at 137/68 with heart rate of 70.  She was saturating at 100% on room air. Initial workup notable for WBC of 6.9, hemoglobin of 9.3, glucose of 219, bicarb 20, BUN 38, creatinine 2.0, with GFR of 26.  Alcohol level negative.  INR within normal limits at 1.2.  CT of the head was obtained that demonstrated a large region of parenchymal hypodensity in the left parietal occipital lobe consistent with acute or subacute infarction.  No hemorrhage or midline shift.  Neurology consulted.  [facility] contacted for admission.    Assessment and Plan:  Acute CVA left PCA territory Prior history of CVA with left-sided carotid artery disease.  Holter monitor negative for arrhythmia.  Patient underwent carotid angiography in December 2024 that demonstrated greater than 70% stenosis in the distal common carotid artery and greater than 70% stenosis at the  origin of the left internal carotid artery.  Per medication reconciliation, patient's last refill of Brilinta was in early February 2024 with only 15-day supply.  CT evidence of a large left parietal occipital infarct  MRI brain:  Large acute infarct of the left PCA territory. No acute hemorrhage or mass effect. Abnormal left ICA flow void, consistent with occlusion or severe stenosis. Multiple old small vessel infarcts of the deep white matter and cerebellum. Telemetry monitoring Allow for permissive HTN (systolic < [id number] and diastolic < 123456) Continue Aspirin 81 mg pod, started Plavix 75 mg po daily and Lipitor 80 mg p.o. daily Continue neurocheck as per protocol PT/OT/SLP eval Neurology consulted TTE LVEF 35 to 40%, global hypokinesis with severe hypokinesis of inferior/posterior, inferior lateral and apical wall.  Moderate asymmetric LV hypertrophy, severe hypertrophy of basal-septal segments.  Grade 1 diastolic dysfunction. LA mild to moderate dilated, moderate MR, negative PFO.  Texted to cardiology PA for Zio patch/cardiac monitor to rule out occult arrhythmias.   Left carotid stenosis Vascular surgery consulted, recommended no intervention at this time due to reperfusion hemorrhage Follow-up with vascular surgery in 2 weeks for possible intervention on her carotid stenosis.   Hypertension S/p permissive hypertension due to acute CVA for 48 hrs Held home medications clonidine 0.1 mg twice daily, Lasix 40 mg p.o. daily, losartan 25 mg p.o. daily 3/14 started losartan 25 mg p.o. daily with holding parameters. We will monitor BP and resume home meds as needed We will  use hydralazine p.o. versus IV according to parameters.   Hypothyroid, continue Synthroid  CKD stage IV, baseline creatinine is around 2 Continue to monitor renal functions and urine output Mild acidosis, bicarb 19, started oral bicarbonate, bicarb 22 improved monitor and replete as needed.   Diabetes mellitus type 2,  recent A1c 6.9, well-controlled Currently patient is not on any medication, continue diabetic diet Hb A1c 7.7 well controlled Continue NovoLog sliding scale, monitor CBG  Vitamin B12 level 232, goal >400, started vitamin B12 1000 mcg IM injection during hospital stay followed by oral supplement. Anemia of chronic disease, vitamin B12 level at lower end, iron and folate within normal range.   Body mass index is 27.28 kg/m.  Nutrition Problem: Moderate Malnutrition Etiology: chronic illness (CHF) Interventions: Interventions: Ensure Enlive (each supplement provides 350kcal and 20 grams of protein), MVI, Liberalize Diet  Diet: Regular diet DVT Prophylaxis: Subcutaneous Lovenox   Advance goals of care discussion: Full code  Family Communication: family was not present at bedside, at the time of interview.  The pt provided permission to discuss medical plan with the family. Opportunity was given to ask question and all questions were answered satisfactorily.   Disposition:  Pt is from Home, admitted with Acute CVA, still has diff in speech and left eye diplopia, which precludes a safe discharge. Discharge to acute inpatient rehab versus SNF.  Stable to discharge when bed will be available.  TOC is following for placement, insurance Auth is pending   Subjective: No significant events overnight, patient was complaining of headache off and on, patient was advised to ask Tylenol for headaches.  Patient is not asking for any medication.  Patient stated that she is having poor vision in the right eye, diplopia in the left eye has been resolved.  Speech is also improving.  Denies any new neurological complaints.  No any other active issues.   Physical Exam: General: NAD, lying comfortably Appear in no distress, affect appropriate Eyes: PERRLA, left eye diplopia ENT: Oral Mucosa Clear, moist  Neck: no JVD,  Cardiovascular: S1 and S2 Present, no Murmur,  Respiratory: good respiratory effort,  Bilateral Air entry equal and Decreased, no Crackles, no wheezes Abdomen: Bowel Sound present, Soft and no tenderness,  Skin: no rashes Extremities: no Pedal edema, no calf tenderness Neurologic: Expressive aphasia is improving, left eye diplopia resolved, poor vision in the right eye, no focal deficits.   Gait not checked due to patient safety concerns  Vitals:   08/21/22 0029 08/21/22 0440 08/21/22 0723 08/21/22 1115  BP: (!) 155/70 (!) 165/63 (!) 137/51 (!) 173/72  Pulse: 75 69 65 64  Resp: 20 18 15 18   Temp: 98.5 F (36.9 C) 98.4 F (36.9 C) 98.1 F (36.7 C) (!) 97.5 F (36.4 C)  TempSrc: Oral Oral Oral Oral  SpO2: 98% 98% 97% 100%  Weight:      Height:        Intake/Output Summary (Last 24 hours) at 08/21/2022 1350 Last data filed at 08/21/2022 1049 Gross per 24 hour  Intake 120 ml  Output --  Net 120 ml   Filed Weights   08/18/22 1529  Weight: 76.7 kg    Data Reviewed: I have personally reviewed and interpreted daily labs, tele strips, imagings as discussed above. I reviewed all nursing notes, pharmacy notes, vitals, pertinent old records I have discussed plan of care as described above with RN and patient/family.  CBC: Recent Labs  Lab 08/18/22 1541 08/20/22 0752 08/21/22 0622  WBC  6.9 8.3 7.6  NEUTROABS 5.3  --   --   HGB 9.3* 9.3* 9.1*  HCT 32.9* 31.9* 31.1*  MCV 84.6 82.0 82.7  PLT 301 272 [id number]   Basic Metabolic Panel: Recent Labs  Lab 08/18/22 1541 08/19/22 1908 08/20/22 0752 08/21/22 0622  NA 138 144 139 141  K 4.2 3.9 4.2 4.2  CL 109 112* 113* 107  CO2 20* 21* 19* 22  GLUCOSE 219* 105* 182* 173*  BUN 38* 33* 34* 34*  CREATININE 2.00* 1.84* 1.73* 1.70*  CALCIUM 8.6* 8.7* 8.8* 9.0  MG  --  2.3 2.5* 2.3  PHOS  --   --  3.9 2.8    Studies: MR ANGIO HEAD WO CONTRAST  Result Date: 08/20/2022 CLINICAL DATA:  Neuro deficit, acute, stroke suspected. Acute left PCA infarct on recent MRI. EXAM: MRA NECK WITHOUT CONTRAST MRA HEAD WITHOUT  CONTRAST TECHNIQUE: Angiographic images of the Circle of Willis were acquired using MRA technique without intravenous contrast. COMPARISON:  Head MRA 05/19/2022. FINDINGS: MRA NECK FINDINGS Aortic arch: Standard 3 vessel aortic arch. Patent brachiocephalic and subclavian arteries. Right carotid system: Patent with mild atherosclerotic type irregularity in the carotid bulb but no evidence of a significant stenosis or dissection. Left carotid system: The common carotid artery is grossly patent though with diminished flow related enhancement. The ICA appears to be occluded at its origin. Vertebral arteries: The vertebral arteries are patent and codominant with antegrade flow bilaterally and no evidence of a significant stenosis or dissection allowing for suboptimal assessment of the proximal V1 segments due to motion and noncontrast technique. Other: None. MRA HEAD FINDINGS Anterior circulation: The intracranial right ICA is patent with an apparent new severe distal supraclinoid stenosis favored to be artifactual given a grossly normal appearance on the neck MRA. There is chronic occlusion of the left ICA with reconstitution of the terminus. The right MCA is patent without evidence of a significant proximal stenosis. The left M1 segment is patent without evidence of a significant focal stenosis, however there is chronic attenuation of left MCA branch vessels including chronic occlusion of the M2 inferior division proximally. The ACAs are patent with unchanged severe proximal right A1 and multifocal left A2 stenoses. No aneurysm is identified. Posterior circulation: The intracranial vertebral arteries are patent to the basilar. Patent PICA and SCA origins are seen bilaterally. Abnormal appearance of the proximal basilar artery is unchanged and may reflect an incidental fenestration. The remainder of the basilar artery is normal in appearance. There is a patent large right posterior communicating artery with hypoplastic  right P1 segment. Both PCAs are patent without evidence of a flow limiting proximal stenosis. There may be a mild proximal left P2 stenosis, and there is a branch vessel irregularity bilaterally including a chronic severe stenosis of a superior proximal P3 branch on the left. No aneurysm is identified. Anatomic variants: None. Other: None. IMPRESSION: 1. 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 2. Chronic left M2 branch occlusion. 3. Chronic severe right A1, left A2, and left P3 stenoses. Electronically Signed   By: Logan Bores M.D.   On: 08/20/2022 16:48   MR ANGIO NECK WO CONTRAST  Result Date: 08/20/2022 CLINICAL DATA:  Neuro deficit, acute, stroke suspected. Acute left PCA infarct on recent MRI. EXAM: MRA NECK WITHOUT CONTRAST MRA HEAD WITHOUT CONTRAST TECHNIQUE: Angiographic images of the Circle of Willis were acquired using MRA technique without intravenous contrast. COMPARISON:  Head MRA 05/19/2022.  FINDINGS: MRA NECK FINDINGS Aortic arch: Standard 3 vessel aortic arch. Patent brachiocephalic and subclavian arteries. Right carotid system: Patent with mild atherosclerotic type irregularity in the carotid bulb but no evidence of a significant stenosis or dissection. Left carotid system: The common carotid artery is grossly patent though with diminished flow related enhancement. The ICA appears to be occluded at its origin. Vertebral arteries: The vertebral arteries are patent and codominant with antegrade flow bilaterally and no evidence of a significant stenosis or dissection allowing for suboptimal assessment of the proximal V1 segments due to motion and noncontrast technique. Other: None. MRA HEAD FINDINGS Anterior circulation: The intracranial right ICA is patent with an apparent new severe distal supraclinoid stenosis favored to be artifactual given a grossly normal appearance on the neck MRA. There is chronic occlusion of the left ICA with  reconstitution of the terminus. The right MCA is patent without evidence of a significant proximal stenosis. The left M1 segment is patent without evidence of a significant focal stenosis, however there is chronic attenuation of left MCA branch vessels including chronic occlusion of the M2 inferior division proximally. The ACAs are patent with unchanged severe proximal right A1 and multifocal left A2 stenoses. No aneurysm is identified. Posterior circulation: The intracranial vertebral arteries are patent to the basilar. Patent PICA and SCA origins are seen bilaterally. Abnormal appearance of the proximal basilar artery is unchanged and may reflect an incidental fenestration. The remainder of the basilar artery is normal in appearance. There is a patent large right posterior communicating artery with hypoplastic right P1 segment. Both PCAs are patent without evidence of a flow limiting proximal stenosis. There may be a mild proximal left P2 stenosis, and there is a branch vessel irregularity bilaterally including a chronic severe stenosis of a superior proximal P3 branch on the left. No aneurysm is identified. Anatomic variants: None. Other: None. IMPRESSION: 1. 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 2. Chronic left M2 branch occlusion. 3. Chronic severe right A1, left A2, and left P3 stenoses. Electronically Signed   By: Logan Bores M.D.   On: 08/20/2022 16:48    Scheduled Meds:  (feeding supplement) PROSource Plus  30 mL Oral TID BM   allopurinol  100 mg Oral Daily   aspirin EC  81 mg Oral Daily   atorvastatin  80 mg Oral QHS   calcitRIOL  0.25 mcg Oral Daily   calcium carbonate  1 tablet Oral TID   clopidogrel  75 mg Oral Daily   cyanocobalamin  1,000 mcg Intramuscular Daily   Followed by   Derrill Memo ON 08/27/2022] vitamin B-12  1,000 mcg Oral Daily   enoxaparin (LOVENOX) injection  30 mg Subcutaneous QHS   feeding supplement  1 Container  Oral TID BM   insulin aspart  0-15 Units Subcutaneous TID WC   levothyroxine  200 mcg Oral Q M,W,F   losartan  25 mg Oral Daily   multivitamin with minerals  1 tablet Oral BID   pantoprazole  20 mg Oral Daily   sodium bicarbonate  650 mg Oral TID   zinc sulfate  220 mg Oral Daily   Continuous Infusions: PRN Meds: acetaminophen **OR** acetaminophen (TYLENOL) oral liquid 160 mg/5 mL **OR** acetaminophen, hydrALAZINE **OR** hydrALAZINE, ondansetron (ZOFRAN) IV, oxyCODONE, senna-docusate  Time spent: 35 minutes  Author: Val Riles. MD [facility] 08/21/2022 1:50 PM  To reach On-call, see care teams to locate the attending and reach out to them  via [email or web address]. If 7PM-7AM, please contact night-coverage If you still have difficulty reaching the attending provider, please page the [facility] (Director on Call) for [facility] on amion for assistance.

## 2022-08-21 NOTE — TOC Benefit Eligibility Note (Signed)
Patient Teacher, English as a foreign language completed.    The patient is currently admitted and upon discharge could be taking Clopidogrel.  The current 30 day co-pay is $0.00.   The patient is insured through [employer]

## 2022-08-21 NOTE — TOC Progression Note (Signed)
Transition of Care [facility]) - Progression Note    Patient Details  Name: [name] MRN: [id number] Date of Birth: 1950/11/23  Transition of Care [facility]) CM/SW Contact  Gerilyn Pilgrim, LCSW Phone Number: 08/21/2022, 1:23 PM  Clinical Narrative:   Josem Kaufmann started for [employer]. Pt can admit over weekend. Number to call is  April [phone]. Marie notified.          Expected Discharge Plan and Services                                               Social Determinants of Health (SDOH) Interventions SDOH Screenings   Food Insecurity: No Food Insecurity (08/19/2022)  Housing: Low Risk  (08/19/2022)  Transportation Needs: No Transportation Needs (08/19/2022)  Utilities: Not At Risk (08/19/2022)  Tobacco Use: Low Risk  (08/20/2022)    Readmission Risk Interventions     No data to display

## 2022-08-21 NOTE — Progress Notes (Signed)
Physical Therapy Treatment Patient Details Name: [name] MRN: [id number] DOB: Oct 15, 1950 Today's Date: 08/21/2022   History of Present Illness 72 y.o. female with medical history significant of prior CVA on aspirin + Brilinta, hypertension, hyperlipidemia, hypothyroidism, HFrEF with HCM, CKD stage IV, type 2 diabetes, CAD, PAD, who presents to the ED due to difficulty speaking and acute vision changes. MRI significant for large acute L PCA infarct.    PT Comments    Pt was sitting EOB finishing breakfast upon arrival. Slight expressive asphasia noted but easily able to comprehend what pt is saying.  She agrees to session and was cooperative throughout." I'll do whatever I need to so I can be independent again." Attempted ambulation without AD however pt very unsteady/unsafe. She tolerated gait with RW ~ 50 ft with min assist. RUE tremor noted once fatigued but overall tolerated well. She was sitting in recliner post session with chair alarm in place. Reviewed importance of continued OOB activity and need to continue to promote returnn function. Rehab at DC remains most appropriate DC disposition.   Recommendations for follow up therapy are one component of a multi-disciplinary discharge planning process, led by the attending physician.  Recommendations may be updated based on patient status, additional functional criteria and insurance authorization.  Follow Up Recommendations  Acute inpatient rehab (3hours/day)     Assistance Recommended at Discharge Frequent or constant Supervision/Assistance  Patient can return home with the following A little help with walking and/or transfers;A lot of help with bathing/dressing/bathroom;Assistance with cooking/housework;Direct supervision/assist for medications management;Direct supervision/assist for financial management;Assist for transportation;Help with stairs or ramp for entrance   Equipment Recommendations  Other (comment) (defer to next level  of care)       Precautions / Restrictions Precautions Precautions: Fall Restrictions Weight Bearing Restrictions: No     Mobility  Bed Mobility  General bed mobility comments: Pt was sitting EOB finishing breakfast upon arrival. In recliner post session    Transfers Overall transfer level: Needs assistance Equipment used: Rolling walker (2 wheels) Transfers: Sit to/from Stand Sit to Stand: Min assist, Min guard, From elevated surface  General transfer comment: Pt performed STS 2 x EOB prior to ambulation. min assist from lower bed height but CGA from elevated surfaces    Ambulation/Gait Ambulation/Gait assistance: Min assist Gait Distance (Feet): 50 Feet Assistive device: Rolling walker (2 wheels), None Gait Pattern/deviations: Step-through pattern Gait velocity: decreased     General Gait Details: Pt ambulated 1 x 12 ft without AD with mod assist to prevent falling. MIn assist to ambulate with RW ~ 50 ft. Does have RUE tremor noted. no buckling observed     Balance Overall balance assessment: Needs assistance Sitting-balance support: No upper extremity supported, Feet supported Sitting balance-Leahy Scale: Good     Standing balance support: Bilateral upper extremity supported Standing balance-Leahy Scale: Fair       Cognition Arousal/Alertness: Awake/alert Behavior During Therapy: Flat affect Overall Cognitive Status: Impaired/Different from baseline    General Comments: Pt is A and oriented x 3. Agrees to session and is cooperative and motivated." I want to be independent again."           General Comments General comments (skin integrity, edema, etc.): Discussed at length need for rehab at DC. Pt is very agreeable to motivated to improve      Pertinent Vitals/Pain Pain Assessment Pain Assessment: 0-10 Pain Location: headache Pain Descriptors / Indicators: Grimacing Pain Intervention(s): Limited activity within patient's tolerance, Monitored during  session,  Premedicated before session, Repositioned, Ice applied     PT Goals (current goals can now be found in the care plan section) Acute Rehab PT Goals Patient Stated Goal: Be independent again Progress towards PT goals: Progressing toward goals    Frequency    7X/week      PT Plan Current plan remains appropriate       AM-PAC PT "6 Clicks" Mobility   Outcome Measure  Help needed turning from your back to your side while in a flat bed without using bedrails?: None Help needed moving from lying on your back to sitting on the side of a flat bed without using bedrails?: A Little Help needed moving to and from a bed to a chair (including a wheelchair)?: A Little Help needed standing up from a chair using your arms (e.g., wheelchair or bedside chair)?: A Little Help needed to walk in hospital room?: A Little Help needed climbing 3-5 steps with a railing? : A Lot 6 Click Score: 18    End of Session   Activity Tolerance: Patient tolerated treatment well;Patient limited by fatigue Patient left: in chair;with call bell/phone within reach;with chair alarm set Nurse Communication: Mobility status PT Visit Diagnosis: Difficulty in walking, not elsewhere classified (R26.2);Hemiplegia and hemiparesis Hemiplegia - Right/Left: Right Hemiplegia - dominant/non-dominant: Dominant Hemiplegia - caused by: Cerebral infarction     Time: [id number] PT Time Calculation (min) (ACUTE ONLY): 24 min  Charges:  $Gait Training: 8-22 mins $Therapeutic Activity: 8-22 mins                    Julaine Fusi PTA 08/21/22, 10:23 AM

## 2022-08-21 NOTE — Telephone Encounter (Signed)
Pharmacy Patient Advocate Encounter  Insurance verification completed.    The patient is insured through [employer]   The patient is currently admitted and ran test claims for the following: Clopidogrel.  Copays and coinsurance results were relayed to Inpatient clinical team.

## 2022-08-21 NOTE — Care Management Important Message (Signed)
Important Message  Patient Details  Name: [name] MRN: [id number] Date of Birth: May 28, 1951   Medicare Important Message Given:  N/A - LOS <3 / Initial given by admissions     Dannette Barbara 08/21/2022, 2:27 PM

## 2022-08-21 NOTE — Progress Notes (Signed)
Palliative: [name], [name], is sitting up in the [place] chair in her room.  She appears chronically ill and somewhat frail.  She greets me, making and somewhat keeping eye contact.  She is alert, oriented to person only at this time.  She states that we are "in the heart place", and that it is October.  She can make her needs known.  There is no family/friends at bedside at this time.  We talk about her acute health concerns and the treatment plan.  I encouraged her that she seemed to be improved from yesterday.  I was not able to discuss CODE STATUS because she is not oriented x 3.  Her healthcare proxy, [name], has endorsed DNR.  It is important to speak with [name] when she is more alert about her wishes.  She would benefit from outpatient palliative services to continue goals of care discussions.  We talk about going to short-term rehab.  At this point she states that, "I hope so".   Call to long-term friend/healthcare surrogate, [name].  We talk about [name]'s condition, seeming to be improved today.  We talk about her desire to go to short-term rehab.  At this point [name] shares that she and [name] have talked for many years about CODE STATUS and for many years [name] his endorsed DNR.  Orders adjusted.  Conference with attending, bedside nursing staff, transition of care team related to patient condition, needs, goals of care, disposition.   Plan: Continue to treat the treatable.  Short-term rehab if agreeable.  Would benefit from outpatient palliative services.  61 minutes  Quinn Axe, NP Palliative medicine team Team phone [phone] Greater than 50% of this time was spent counseling and coordinating care related to the above assessment and plan.

## 2022-08-22 DIAGNOSIS — I639 Cerebral infarction, unspecified: Secondary | ICD-10-CM | POA: Diagnosis not present

## 2022-08-22 DIAGNOSIS — E440 Moderate protein-calorie malnutrition: Secondary | ICD-10-CM | POA: Insufficient documentation

## 2022-08-22 LAB — CBC
HCT: 29.9 % — ABNORMAL LOW (ref 36.0–46.0)
Hemoglobin: 8.8 g/dL — ABNORMAL LOW (ref 12.0–15.0)
MCH: 24.2 pg — ABNORMAL LOW (ref 26.0–34.0)
MCHC: 29.4 g/dL — ABNORMAL LOW (ref 30.0–36.0)
MCV: 82.4 fL (ref 80.0–100.0)
Platelets: 300 10*3/uL (ref 150–400)
RBC: 3.63 MIL/uL — ABNORMAL LOW (ref 3.87–5.11)
RDW: 19.3 % — ABNORMAL HIGH (ref 11.5–15.5)
WBC: 6.2 10*3/uL (ref 4.0–10.5)
nRBC: 0 % (ref 0.0–0.2)

## 2022-08-22 LAB — BASIC METABOLIC PANEL
Anion gap: 8 (ref 5–15)
BUN: 32 mg/dL — ABNORMAL HIGH (ref 8–23)
CO2: 22 mmol/L (ref 22–32)
Calcium: 8.4 mg/dL — ABNORMAL LOW (ref 8.9–10.3)
Chloride: 109 mmol/L (ref 98–111)
Creatinine, Ser: 1.66 mg/dL — ABNORMAL HIGH (ref 0.44–1.00)
GFR, Estimated: 32 mL/min — ABNORMAL LOW (ref >60)
Glucose, Bld: 162 mg/dL — ABNORMAL HIGH (ref 70–99)
Potassium: 3.6 mmol/L (ref 3.5–5.1)
Sodium: 139 mmol/L (ref 135–145)

## 2022-08-22 LAB — GLUCOSE, CAPILLARY
Glucose-Capillary: 178 mg/dL — ABNORMAL HIGH (ref 70–99)
Glucose-Capillary: 261 mg/dL — ABNORMAL HIGH (ref 70–99)
Glucose-Capillary: 265 mg/dL — ABNORMAL HIGH (ref 70–99)
Glucose-Capillary: 117 mg/dL — ABNORMAL HIGH (ref 70–99)
Glucose-Capillary: 152 mg/dL — ABNORMAL HIGH (ref 70–99)

## 2022-08-22 LAB — MAGNESIUM: Magnesium: 2.3 mg/dL (ref 1.7–2.4)

## 2022-08-22 LAB — PHOSPHORUS: Phosphorus: 2.4 mg/dL — ABNORMAL LOW (ref 2.5–4.6)

## 2022-08-22 MED ORDER — LOSARTAN POTASSIUM 50 MG PO TABS
50.0000 mg | ORAL_TABLET | Freq: Every day | ORAL | Status: DC
  Administered 2022-08-22 – 2022-08-23 (×2): 50 mg via ORAL
  Filled 2022-08-22 (×2): qty 1

## 2022-08-22 MED ORDER — ENOXAPARIN SODIUM 40 MG/0.4ML IJ SOSY
40.0000 mg | PREFILLED_SYRINGE | Freq: Every day | INTRAMUSCULAR | Status: DC
  Administered 2022-08-22 – 2022-08-23 (×2): 40 mg via SUBCUTANEOUS
  Filled 2022-08-22 (×2): qty 0.4

## 2022-08-22 NOTE — Progress Notes (Signed)
[facility] Progress Note  Patient: [name]    [id number]  DOA: 08/18/2022     Date of Service: the patient was seen and examined on 08/22/2022  Chief Complaint  Patient presents with   Aphasia   Brief hospital course: [name] is a 72 y.o. female with medical history significant of prior CVA on aspirin + Brilinta, hypertension, hyperlipidemia, hypothyroidism, HFrEF with HCM, CKD stage IV, type 2 diabetes, CAD, PAD, who presents to the ED due to difficulty speaking.  History limited due to aphasia.  [name] is unable to quantify when her symptoms began.  She is able to express difficulty with her speech and vision.  She describes seeing 2 different images from each eye and feels that they are not seeing together. Per chart review, patient has a friend that checks on her daily. On 3/11 at approximately 1 PM, patient was able to speak without any difficulty.  When her friend called her today in the morning, patient was noted to have difficulty with her speech.  Due to this, EMS was called.   ED course: On arrival to the ED, patient was normotensive at 137/68 with heart rate of 70.  She was saturating at 100% on room air. Initial workup notable for WBC of 6.9, hemoglobin of 9.3, glucose of 219, bicarb 20, BUN 38, creatinine 2.0, with GFR of 26.  Alcohol level negative.  INR within normal limits at 1.2.  CT of the head was obtained that demonstrated a large region of parenchymal hypodensity in the left parietal occipital lobe consistent with acute or subacute infarction.  No hemorrhage or midline shift.  Neurology consulted.  TR[facility] contacted for admission.    Assessment and Plan:  Acute CVA left PCA territory Prior history of CVA with left-sided carotid artery disease.  Holter monitor negative for arrhythmia.  Patient underwent carotid angiography in December 2024 that demonstrated greater than 70% stenosis in the distal common carotid artery and greater than 70% stenosis at the  origin of the left internal carotid artery.  Per medication reconciliation, patient's last refill of Brilinta was in early February 2024 with only 15-day supply.  CT evidence of a large left parietal occipital infarct  MRI brain:  Large acute infarct of the left PCA territory. No acute hemorrhage or mass effect. Abnormal left ICA flow void, consistent with occlusion or severe stenosis. Multiple old small vessel infarcts of the deep white matter and cerebellum. S/p permissive hypertension for 48 hours, keep normotensive now.  Neurology was consulted. Continue Aspirin 81 mg pod, started Plavix 75 mg po daily and Lipitor 80 mg p.o. daily. Patient should remain on DAPT for at least 90 days and single antiplatelet for life (duration of DAPT after 90 days will be referred to vascular surgery).  Neurology recommend ambulatory cardiac monitoring given LA dilation on TTE.  Texted to cardiology PA for Zio patch/cardiac monitor to rule out occult arrhythmias. TTE LVEF 35 to 40%, global hypokinesis with severe hypokinesis of inferior/posterior, inferior lateral and apical wall.  Moderate asymmetric LV hypertrophy, severe hypertrophy of basal-septal segments.  Grade 1 diastolic dysfunction. LA mild to moderate dilated, moderate MR, negative PFO. Continue telemetry Continue neurocheck as per protocol PT/OT eval done, recommend SNF placement.   Left carotid stenosis Vascular surgery consulted, recommended no intervention at this time due to reperfusion hemorrhage Follow-up with vascular surgery in 2 weeks for possible intervention on her carotid stenosis.   Hypertension and chronic systolic CHF, euvolemic. S/p permissive hypertension  due to acute CVA for 48 hrs Held home medications clonidine 0.1 mg twice daily, Lasix 40 mg p.o. daily, losartan 25 mg p.o. daily 3/14 started losartan 25 mg p.o. daily with holding parameters. We will monitor BP and resume home meds as needed We will use hydralazine p.o. versus IV  according to parameters.   Hypothyroid, continue Synthroid  CKD stage IV, baseline creatinine is around 2 Continue to monitor renal functions and urine output Mild acidosis, bicarb 19, started oral bicarbonate, bicarb 22 improved monitor and replete as needed.   Diabetes mellitus type 2, Hb A1c 7.7 well controlled Currently patient is not on any medication, continue diabetic diet Continue NovoLog sliding scale, monitor CBG  Vitamin B12 level 232, goal >400, started vitamin B12 1000 mcg IM injection during hospital stay followed by oral supplement. Anemia of chronic disease, vitamin B12 level at lower end, iron and folate within normal range.   Body mass index is 27.28 kg/m.  Nutrition Problem: Moderate Malnutrition Etiology: chronic illness (CHF) Interventions: Interventions: Ensure Enlive (each supplement provides 350kcal and 20 grams of protein), MVI, Liberalize Diet  Diet: Regular diet DVT Prophylaxis: Subcutaneous Lovenox   Advance goals of care discussion: Full code  Family Communication: family was not present at bedside, at the time of interview.  The pt provided permission to discuss medical plan with the family. Opportunity was given to ask question and all questions were answered satisfactorily.   Disposition:  Pt is from Home, admitted with Acute CVA, still has diff in speech and left eye diplopia, which precludes a safe discharge. Discharge to acute inpatient rehab.  Stable to discharge when bed will be available.  TOC is following for placement, insurance Auth is pending   Subjective: No significant events overnight, patient headache has much improved, has off-and-on very little headache.  Patient's speech is improving and vision is also improving, no more diplopia.  Patient stated that she sees different with both eyes open but if she closes either eye then vision seems good. Denies any focal weakness or numbness or deficits.   Physical Exam: General: NAD,  lying comfortably Appear in no distress, affect appropriate Eyes: PERRLA, left eye diplopia resolved ENT: Oral Mucosa Clear, moist  Neck: no JVD,  Cardiovascular: S1 and S2 Present, no Murmur,  Respiratory: good respiratory effort, Bilateral Air entry equal and Decreased, no Crackles, no wheezes Abdomen: Bowel Sound present, Soft and no tenderness,  Skin: no rashes Extremities: no Pedal edema, no calf tenderness Neurologic: Expressive aphasia is improving, left eye diplopia resolved, still feels that patient is not all right with both open eyes but vision is good when she closes 1 eye.   Gait not checked due to patient safety concerns  Vitals:   08/22/22 0055 08/22/22 0511 08/22/22 0752 08/22/22 1145  BP: (!) 144/56 (!) 149/65 (!) 147/68 (!) 140/79  Pulse: 73 75 62 78  Resp: 18 18 18 16   Temp: 98.7 F (37.1 C) 98.1 F (36.7 C) 98.1 F (36.7 C) 98.2 F (36.8 C)  TempSrc:  Oral Oral   SpO2: 98% 98% 98% 100%  Weight:      Height:        Intake/Output Summary (Last 24 hours) at 08/22/2022 1311 Last data filed at 08/22/2022 0700 Gross per 24 hour  Intake 420 ml  Output --  Net 420 ml   Filed Weights   08/18/22 1529  Weight: 76.7 kg    Data Reviewed: I have personally reviewed and interpreted daily labs, tele  strips, imagings as discussed above. I reviewed all nursing notes, pharmacy notes, vitals, pertinent old records I have discussed plan of care as described above with RN and patient/family.  CBC: Recent Labs  Lab 08/18/22 1541 08/20/22 0752 08/21/22 0622 08/22/22 0409  WBC 6.9 8.3 7.6 6.2  NEUTROABS 5.3  --   --   --   HGB 9.3* 9.3* 9.1* 8.8*  HCT 32.9* 31.9* 31.1* 29.9*  MCV 84.6 82.0 82.7 82.4  PLT 301 272 272 [id number]   Basic Metabolic Panel: Recent Labs  Lab 08/18/22 1541 08/19/22 1908 08/20/22 0752 08/21/22 0622 08/22/22 0409  NA 138 144 139 141 139  K 4.2 3.9 4.2 4.2 3.6  CL 109 112* 113* 107 109  CO2 20* 21* 19* 22 22  GLUCOSE 219* 105* 182* 173*  162*  BUN 38* 33* 34* 34* 32*  CREATININE 2.00* 1.84* 1.73* 1.70* 1.66*  CALCIUM 8.6* 8.7* 8.8* 9.0 8.4*  MG  --  2.3 2.5* 2.3 2.3  PHOS  --   --  3.9 2.8 2.4*    Studies: No results found.  Scheduled Meds:  (feeding supplement) PROSource Plus  30 mL Oral TID BM   allopurinol  100 mg Oral Daily   aspirin EC  81 mg Oral Daily   atorvastatin  80 mg Oral QHS   calcitRIOL  0.25 mcg Oral Daily   calcium carbonate  1 tablet Oral TID   clopidogrel  75 mg Oral Daily   cyanocobalamin  1,000 mcg Intramuscular Daily   Followed by   Derrill Memo ON 08/27/2022] vitamin B-12  1,000 mcg Oral Daily   enoxaparin (LOVENOX) injection  40 mg Subcutaneous QHS   feeding supplement  1 Container Oral TID BM   insulin aspart  0-15 Units Subcutaneous TID WC   levothyroxine  200 mcg Oral Q M,W,F   losartan  50 mg Oral Daily   multivitamin with minerals  1 tablet Oral BID   pantoprazole  20 mg Oral Daily   sodium bicarbonate  650 mg Oral TID   zinc sulfate  220 mg Oral Daily   Continuous Infusions: PRN Meds: acetaminophen **OR** acetaminophen (TYLENOL) oral liquid 160 mg/5 mL **OR** acetaminophen, hydrALAZINE **OR** hydrALAZINE, ondansetron (ZOFRAN) IV, oxyCODONE, senna-docusate  Time spent: 35 minutes  Author: Val Riles. MD [facility] 08/22/2022 1:11 PM  To reach On-call, see care teams to locate the attending and reach out to them via [email or web address]. If 7PM-7AM, please contact night-coverage If you still have difficulty reaching the attending provider, please page the [facility] (Director on Call) for [facility] on amion for assistance.

## 2022-08-22 NOTE — Progress Notes (Signed)
Occupational Therapy Treatment Patient Details Name: [name] MRN: [id number] DOB: 11/26/1950 Today's Date: 08/22/2022   History of present illness 72 y.o. female with medical history significant of prior CVA on aspirin + Brilinta, hypertension, hyperlipidemia, hypothyroidism, HFrEF with HCM, CKD stage IV, type 2 diabetes, CAD, PAD, who presents to the ED due to difficulty speaking and acute vision changes. MRI significant for large acute L PCA infarct.   OT comments  Ms Lodes was seen for OT treatment on this date. Upon arrival to room pt reclined in bed, agreeable to tx. Pt requires CGA + RW for toilet t/f and pericare sitting. MIN cues hand washing standing sink side, cues to locate sink/soap and sequence. Seated vision assessment - noted significantly impaired R peripheral vision and decreased smoothness tracking objects vertically in R field. Pt follows commands and demonstrates fair safety awareness, limited in session by expressive aphasia / word finding difficulties. Pt completes clock drawing with dots to mark digits, does not write numbers. When asked to name shapes (square, triangle, circle) pt unable to find correct label however when asked to point to square pt identifies each shape easily. Pt making good progress toward goals, will continue to follow POC. Discharge recommendation remains appropriate.     Recommendations for follow up therapy are one component of a multi-disciplinary discharge planning process, led by the attending physician.  Recommendations may be updated based on patient status, additional functional criteria and insurance authorization.    Follow Up Recommendations  Acute inpatient rehab (3hours/day)     Assistance Recommended at Discharge Frequent or constant Supervision/Assistance  Patient can return home with the following  A lot of help with walking and/or transfers;A lot of help with bathing/dressing/bathroom;Assistance with cooking/housework;Assist for  transportation;Help with stairs or ramp for entrance;Direct supervision/assist for financial management;Direct supervision/assist for medications management   Equipment Recommendations  Other (comment) (defer)    Recommendations for Other Services      Precautions / Restrictions Precautions Precautions: Fall Restrictions Weight Bearing Restrictions: No       Mobility Bed Mobility Overal bed mobility: Needs Assistance Bed Mobility: Supine to Sit     Supine to sit: Min guard          Transfers Overall transfer level: Needs assistance Equipment used: Rolling walker (2 wheels) Transfers: Sit to/from Stand Sit to Stand: Min guard           General transfer comment: from [facility] and bed height     Balance Overall balance assessment: Needs assistance Sitting-balance support: No upper extremity supported, Feet supported Sitting balance-Leahy Scale: Good     Standing balance support: No upper extremity supported, During functional activity Standing balance-Leahy Scale: Fair                             ADL either performed or assessed with clinical judgement   ADL Overall ADL's : Needs assistance/impaired                                       General ADL Comments: CGA + RW for toilet t/f and pericare sitting. MIN cues hand washing standing sink side, cues to locate sink/soap and sequence.       Vision   Vision Assessment?: Vision impaired- to be further tested in functional context;Yes Tracking/Visual Pursuits: Decreased smoothness of eye movement to RIGHT superior field;Decreased smoothness  of eye movement to RIGHT inferior field Additional Comments: significantly decreased R peripheral vision          Cognition Arousal/Alertness: Awake/alert Behavior During Therapy: Flat affect Overall Cognitive Status: Impaired/Different from baseline                                 General Comments: follows commands and fair  safety awareness, limited by expressive aphasia / word finding difficulties                   Pertinent Vitals/ Pain       Pain Assessment Pain Assessment: No/denies pain   Frequency  Min 3X/week        Progress Toward Goals  OT Goals(current goals can now be found in the care plan section)  Progress towards OT goals: Progressing toward goals  Acute Rehab OT Goals Patient Stated Goal: to return to PLOF OT Goal Formulation: With patient Time For Goal Achievement: 09/02/22 Potential to Achieve Goals: Fair ADL Goals Pt Will Perform Grooming: with supervision;standing Pt Will Perform Lower Body Dressing: with supervision;sit to/from stand Pt Will Transfer to Toilet: with supervision;ambulating Pt Will Perform Toileting - Clothing Manipulation and hygiene: with supervision;sit to/from stand Additional ADL Goal #1: Pt will demonstrate visual strategies to locate self care items to the R with min cuing for technique.  Plan Discharge plan remains appropriate;Frequency remains appropriate    Co-evaluation                 AM-PAC OT "6 Clicks" Daily Activity     Outcome Measure   Help from another person eating meals?: A Little Help from another person taking care of personal grooming?: A Little Help from another person toileting, which includes using toliet, bedpan, or urinal?: A Lot Help from another person bathing (including washing, rinsing, drying)?: A Lot Help from another person to put on and taking off regular upper body clothing?: A Little Help from another person to put on and taking off regular lower body clothing?: A Lot 6 Click Score: 15    End of Session Equipment Utilized During Treatment: Rolling walker (2 wheels)  OT Visit Diagnosis: Unsteadiness on feet (R26.81);Repeated falls (R29.6);Muscle weakness (generalized) (M62.81)   Activity Tolerance Patient tolerated treatment well   Patient Left in chair;with call bell/phone within reach;with chair  alarm set   Nurse Communication          Time: [id number] OT Time Calculation (min): 22 min  Charges: OT General Charges $OT Visit: 1 Visit OT Treatments $Self Care/Home Management : 8-22 mins  Dessie Coma, M.S. OTR/L  08/22/22, 10:53 AM  ascom [phone]

## 2022-08-22 NOTE — Plan of Care (Signed)
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 Etiology of stroke favored to be severe PCA stenosis. Patient should remain on DAPT for at least 90 days and single antiplatelet for life (duration of DAPT after 90 days will be referred to vascular surgery). Carotid US documented some flow in L ICA although MRA H&N suggests that vessel may be completely occluded. Vascular surgery plans to f/u with patient in clinic to determine if there is any flow in that vessel that would mean patient would benefit from intervention there. Stroke workup completed at this time. Would recommend ambulatory cardiac monitoring given LA dilation on TTE. I will arrange outpatient neurology f/u. Neurology to be available for questions going forward.  Su Monks, MD [facility] [phone]  If 7pm- 7am, please page neurology on call as listed in [place].

## 2022-08-22 NOTE — Progress Notes (Signed)
Physical Therapy Treatment Patient Details Name: [name] MRN: [id number] DOB: 05-09-1951 Today's Date: 08/22/2022   History of Present Illness 72 y.o. female with medical history significant of prior CVA on aspirin + Brilinta, hypertension, hyperlipidemia, hypothyroidism, HFrEF with HCM, CKD stage IV, type 2 diabetes, CAD, PAD, who presents to the ED due to difficulty speaking and acute vision changes. MRI significant for large acute L PCA infarct.    PT Comments    Modified session due to pt not feeling well and lethargic this pm requiring Min/ModA to transfer sit/stand several times after receiving pt on BSC. Pt declined gait training and was assisted back to bed on request. She continues to struggle with speaking and word finding issues. Continue to recommend AIR once medically cleared for d/c.   Recommendations for follow up therapy are one component of a multi-disciplinary discharge planning process, led by the attending physician.  Recommendations may be updated based on patient status, additional functional criteria and insurance authorization.  Follow Up Recommendations  Acute inpatient rehab (3hours/day)     Assistance Recommended at Discharge Frequent or constant Supervision/Assistance  Patient can return home with the following A little help with walking and/or transfers;A lot of help with bathing/dressing/bathroom;Assistance with cooking/housework;Direct supervision/assist for medications management;Direct supervision/assist for financial management;Assist for transportation;Help with stairs or ramp for entrance   Equipment Recommendations  Other (comment) (defer to next level of care)    Recommendations for Other Services       Precautions / Restrictions Precautions Precautions: Fall Restrictions Weight Bearing Restrictions: No     Mobility  Bed Mobility Overal bed mobility: Needs Assistance Bed Mobility: Sit to Supine     Supine to sit: Min guard      General bed mobility comments: Pt fatigued and requiring more assistance    Transfers Overall transfer level: Needs assistance Equipment used: Rolling walker (2 wheels) Transfers: Sit to/from Stand Sit to Stand: Min assist Stand pivot transfers: Min assist         General transfer comment:  (Increased assist due to fatigue and multiple BM's this pm. Required manual assist to maintain R hand grip on RW)    Ambulation/Gait               General Gait Details:  (Pt declined due to not feeling well and significant fatigue)   Stairs             Wheelchair Mobility    Modified Rankin (Stroke Patients Only)       Balance Overall balance assessment: Needs assistance Sitting-balance support: No upper extremity supported, Feet supported Sitting balance-Leahy Scale: Good     Standing balance support: Bilateral upper extremity supported, Reliant on assistive device for balance Standing balance-Leahy Scale: Fair                              Cognition Arousal/Alertness: Awake/alert Behavior During Therapy: Flat affect Overall Cognitive Status: Impaired/Different from baseline                                 General Comments:  (Expressive aphasia noted, with word finding issues this pm)        Exercises General Exercises - Lower Extremity Ankle Circles/Pumps: AROM, Both, 10 reps Heel Slides: AROM, Both, 10 reps    General Comments General comments (skin integrity, edema, etc.):  (Pt had been on/off commode  several times this pm, received on BSC with c/o fatigue and not feeling well.)      Pertinent Vitals/Pain Pain Assessment Pain Assessment: No/denies pain    Home Living                          Prior Function            PT Goals (current goals can now be found in the care plan section) Acute Rehab PT Goals Patient Stated Goal: Be independent again    Frequency    7X/week      PT Plan Current plan  remains appropriate    Co-evaluation              AM-PAC PT "6 Clicks" Mobility   Outcome Measure  Help needed turning from your back to your side while in a flat bed without using bedrails?: None Help needed moving from lying on your back to sitting on the side of a flat bed without using bedrails?: A Little Help needed moving to and from a bed to a chair (including a wheelchair)?: A Little Help needed standing up from a chair using your arms (e.g., wheelchair or bedside chair)?: A Little Help needed to walk in hospital room?: A Little Help needed climbing 3-5 steps with a railing? : A Lot 6 Click Score: 18    End of Session Equipment Utilized During Treatment: Gait belt Activity Tolerance: Patient limited by fatigue Patient left: in bed;with call bell/phone within reach;with bed alarm set Nurse Communication: Mobility status PT Visit Diagnosis: Difficulty in walking, not elsewhere classified (R26.2);Hemiplegia and hemiparesis Hemiplegia - Right/Left: Right Hemiplegia - dominant/non-dominant: Dominant Hemiplegia - caused by: Cerebral infarction     Time: [id number] PT Time Calculation (min) (ACUTE ONLY): 19 min  Charges:  $Therapeutic Activity: 8-22 mins                    Mikel Cella, PTA  Josie Dixon 08/22/2022, 5:27 PM

## 2022-08-23 DIAGNOSIS — I639 Cerebral infarction, unspecified: Secondary | ICD-10-CM | POA: Diagnosis not present

## 2022-08-23 LAB — BASIC METABOLIC PANEL
Anion gap: 8 (ref 5–15)
BUN: 33 mg/dL — ABNORMAL HIGH (ref 8–23)
CO2: 22 mmol/L (ref 22–32)
Calcium: 8.3 mg/dL — ABNORMAL LOW (ref 8.9–10.3)
Chloride: 108 mmol/L (ref 98–111)
Creatinine, Ser: 1.58 mg/dL — ABNORMAL HIGH (ref 0.44–1.00)
GFR, Estimated: 34 mL/min — ABNORMAL LOW (ref >60)
Glucose, Bld: 157 mg/dL — ABNORMAL HIGH (ref 70–99)
Potassium: 3.4 mmol/L — ABNORMAL LOW (ref 3.5–5.1)
Sodium: 138 mmol/L (ref 135–145)

## 2022-08-23 LAB — GLUCOSE, CAPILLARY
Glucose-Capillary: 166 mg/dL — ABNORMAL HIGH (ref 70–99)
Glucose-Capillary: 173 mg/dL — ABNORMAL HIGH (ref 70–99)
Glucose-Capillary: 174 mg/dL — ABNORMAL HIGH (ref 70–99)
Glucose-Capillary: 244 mg/dL — ABNORMAL HIGH (ref 70–99)

## 2022-08-23 LAB — CBC
HCT: 28.9 % — ABNORMAL LOW (ref 36.0–46.0)
Hemoglobin: 8.6 g/dL — ABNORMAL LOW (ref 12.0–15.0)
MCH: 24.4 pg — ABNORMAL LOW (ref 26.0–34.0)
MCHC: 29.8 g/dL — ABNORMAL LOW (ref 30.0–36.0)
MCV: 81.9 fL (ref 80.0–100.0)
Platelets: 294 10*3/uL (ref 150–400)
RBC: 3.53 MIL/uL — ABNORMAL LOW (ref 3.87–5.11)
RDW: 19.3 % — ABNORMAL HIGH (ref 11.5–15.5)
WBC: 6.1 10*3/uL (ref 4.0–10.5)
nRBC: 0 % (ref 0.0–0.2)

## 2022-08-23 LAB — VITAMIN E
Vitamin E (Alpha Tocopherol): 7.7 mg/L — ABNORMAL LOW (ref 9.0–29.0)
Vitamin E(Gamma Tocopherol): 0.6 mg/L (ref 0.5–4.9)

## 2022-08-23 LAB — VITAMIN A: Vitamin A (Retinoic Acid): 49.8 ug/dL (ref 22.0–69.5)

## 2022-08-23 MED ORDER — POTASSIUM CHLORIDE CRYS ER 20 MEQ PO TBCR
20.0000 meq | EXTENDED_RELEASE_TABLET | Freq: Once | ORAL | Status: AC
  Administered 2022-08-23: 20 meq via ORAL
  Filled 2022-08-23: qty 1

## 2022-08-23 NOTE — Progress Notes (Signed)
Notified by central tele of 5 beat run of V-tach.

## 2022-08-23 NOTE — Progress Notes (Signed)
Speech Language Pathology Treatment:    Patient Details Name: [name] MRN: [id number] DOB: Nov 04, 1950 Today's Date: 08/23/2022 Time: [id number] SLP Time Calculation (min) (ACUTE ONLY): 55 min  Assessment / Plan / Recommendation Clinical Impression  Pt seen for skilled SLP services targeting functional receptive and expressive communication. Good participation this date. Pt continues to presents with a mainly fluent aphasia with semantic and phonemic paraphasias as well as perseveration. [place] of error free speech as well as emerging successful use of circumlocution noted. During structured tasks and conversational exchanges, pt benefited from mod/max verbal cues for use of circumlocution strategies. During informal tasks, pt able to follow 1-step commands and answer basic yes/no questions with ~80% accuracy. Pt benefited from known context. Pt's receptive language skills > expressive language skills.   Pt educated re: aphasia, progress to date, SLP recommendations, communication strategies, and SLP POC. Pt verbalized understanding. Reinforcement of content may be needed. Pt tearful at times re: CLOF for communication. Supportive counseling provided.   Recommend ongoing SLP services acute and intensive SLP services post-acute for aphasia. SLP to continue to f/u while pt in house.    HPI HPI: Per H&P, "is a 72 y.o. female with medical history significant of prior CVA on aspirin + Brilinta, hypertension, hyperlipidemia, hypothyroidism, HFrEF with HCM, CKD stage IV, type 2 diabetes, CAD, PAD, who presents to the ED due to difficulty speaking.  History limited due to aphasia.     [name] is unable to quantify when her symptoms began.  She is able to express difficulty with her speech and vision.  She describes seeing 2 different images from each eye and feels that they are not seeing together.  She denies any pain, difficulty breathing, focal weakness or lower extremity swelling.  She states  aspirin was last taken this morning and she believes Brilinta was last taken either yesterday or the day before.     Per chart review, patient has a friend that checks on her daily.  Yesterday at approximately 1 PM, patient was able to speak without any difficulty.  When her friend called her today in the morning, patient was noted to have difficulty with her speech.  Due to this, EMS was called." Per Brain MRI, pt w/ "1. Large acute infarct of the left PCA territory. No acute  hemorrhage or mass effect.  2. Abnormal left ICA flow void, consistent with occlusion or severe  stenosis.  3. 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  Recommendations may be updated based on patient status, additional functional criteria and insurance authorization.    Recommendations                  Follow Up Recommendations: Acute inpatient rehab (3hours/day) Assistance recommended at discharge: Frequent or constant Supervision/Assistance SLP Visit Diagnosis: Aphasia (R47.01) Plan: Continue with current plan of care          Cherrie Gauze, M.S., [facility] [phone] [place])  Quintella Baton  08/23/2022, 9:57 AM

## 2022-08-23 NOTE — Progress Notes (Signed)
[facility] Progress Note  Patient: [name]    [id number]  DOA: 08/18/2022     Date of Service: the patient was seen and examined on 08/23/2022  Chief Complaint  Patient presents with   Aphasia   Brief hospital course: [name] is a 72 y.o. female with medical history significant of prior CVA on aspirin + Brilinta, hypertension, hyperlipidemia, hypothyroidism, HFrEF with HCM, CKD stage IV, type 2 diabetes, CAD, PAD, who presents to the ED due to difficulty speaking.  History limited due to aphasia.  [name] is unable to quantify when her symptoms began.  She is able to express difficulty with her speech and vision.  She describes seeing 2 different images from each eye and feels that they are not seeing together. Per chart review, patient has a friend that checks on her daily. On 3/11 at approximately 1 PM, patient was able to speak without any difficulty.  When her friend called her today in the morning, patient was noted to have difficulty with her speech.  Due to this, EMS was called.   ED course: On arrival to the ED, patient was normotensive at 137/68 with heart rate of 70.  She was saturating at 100% on room air. Initial workup notable for WBC of 6.9, hemoglobin of 9.3, glucose of 219, bicarb 20, BUN 38, creatinine 2.0, with GFR of 26.  Alcohol level negative.  INR within normal limits at 1.2.  CT of the head was obtained that demonstrated a large region of parenchymal hypodensity in the left parietal occipital lobe consistent with acute or subacute infarction.  No hemorrhage or midline shift.  Neurology consulted.  [facility] contacted for admission.    Assessment and Plan:  Acute CVA left PCA territory Prior history of CVA with left-sided carotid artery disease.  Holter monitor negative for arrhythmia.  Patient underwent carotid angiography in December 2024 that demonstrated greater than 70% stenosis in the distal common carotid artery and greater than 70% stenosis at the  origin of the left internal carotid artery.  Per medication reconciliation, patient's last refill of Brilinta was in early February 2024 with only 15-day supply.  CT evidence of a large left parietal occipital infarct  MRI brain:  Large acute infarct of the left PCA territory. No acute hemorrhage or mass effect. Abnormal left ICA flow void, consistent with occlusion or severe stenosis. Multiple old small vessel infarcts of the deep white matter and cerebellum. S/p permissive hypertension for 48 hours, keep normotensive now.  Neurology was consulted. Continue Aspirin 81 mg pod, started Plavix 75 mg po daily and Lipitor 80 mg p.o. daily. Patient should remain on DAPT for at least 90 days and single antiplatelet for life (duration of DAPT after 90 days will be referred to vascular surgery).  Neurology recommend ambulatory cardiac monitoring given LA dilation on TTE.  Texted to cardiology PA for Zio patch/cardiac monitor to rule out occult arrhythmias. TTE LVEF 35 to 40%, global hypokinesis with severe hypokinesis of inferior/posterior, inferior lateral and apical wall.  Moderate asymmetric LV hypertrophy, severe hypertrophy of basal-septal segments.  Grade 1 diastolic dysfunction. LA mild to moderate dilated, moderate MR, negative PFO. Continue telemetry Continue neurocheck as per protocol PT/OT eval done, recommend acute rehab placement.   Left carotid stenosis Vascular surgery consulted, recommended no intervention at this time due to reperfusion hemorrhage Follow-up with vascular surgery in 2 weeks for possible intervention on her carotid stenosis.   Hypertension and chronic systolic CHF, euvolemic. S/p permissive  hypertension due to acute CVA for 48 hrs Held home medications clonidine 0.1 mg twice daily, Lasix 40 mg p.o. daily, losartan 25 mg p.o. daily 3/16 increased losartan 50 mg p.o. daily with holding parameters. We will monitor BP and resume home meds as needed We will use hydralazine p.o.  versus IV according to parameters.   Hypothyroid, continue Synthroid  CKD stage IV, baseline creatinine is around 2 Continue to monitor renal functions and urine output Mild metabolic acidosis, bicarb 19, Resolved, s/p oral bicarbonate  monitor and replete as needed. Hypokalemia, potassium repleted cautiously Monitor electrolytes and replete as needed.   Diabetes mellitus type 2, Hb A1c 7.7 well controlled Currently patient is not on any medication, continue diabetic diet Continue NovoLog sliding scale, monitor CBG   Vitamin B12 level 232, goal >400, started vitamin B12 1000 mcg IM injection during hospital stay followed by oral supplement. Anemia of chronic disease, vitamin B12 level at lower end, iron and folate within normal range.   Body mass index is 27.28 kg/m.  Nutrition Problem: Moderate Malnutrition Etiology: chronic illness (CHF) Interventions: Interventions: Ensure Enlive (each supplement provides 350kcal and 20 grams of protein), MVI, Liberalize Diet  Diet: Regular diet DVT Prophylaxis: Subcutaneous Lovenox   Advance goals of care discussion: Full code  Family Communication: family was not present at bedside, at the time of interview.  The pt provided permission to discuss medical plan with the family. Opportunity was given to ask question and all questions were answered satisfactorily.   Disposition:  Pt is from Home, admitted with Acute CVA, still has diff in speech and left eye diplopia, which precludes a safe discharge. Discharge to acute inpatient rehab.  Stable to discharge when bed will be available.  TOC is following for placement, insurance Auth is pending   Subjective: No significant events overnight, patient stated that her vision has improved, speech is also improving, denies any headache or dizziness today.  Denies any new neurological symptoms.  No chest pain or palpitation, no shortness of breath.  Physical Exam: General: NAD, lying  comfortably Appear in no distress, affect appropriate Eyes: PERRLA, left eye diplopia resolved ENT: Oral Mucosa Clear, moist  Neck: no JVD,  Cardiovascular: S1 and S2 Present, no Murmur,  Respiratory: good respiratory effort, Bilateral Air entry equal and Decreased, no Crackles, no wheezes Abdomen: Bowel Sound present, Soft and no tenderness,  Skin: no rashes Extremities: no Pedal edema, no calf tenderness Neurologic: Expressive aphasia is improving, left eye diplopia resolved and bilateral patient is improving. Gait not checked due to patient safety concerns  Vitals:   08/22/22 2027 08/23/22 0001 08/23/22 0600 08/23/22 0732  BP: (!) 152/65 133/76 (!) 178/69 (!) 149/62  Pulse: 74 70 66 71  Resp: 16 16 18 16   Temp: 98.3 F (36.8 C) 98.1 F (36.7 C) 98.2 F (36.8 C) 98.4 F (36.9 C)  TempSrc: Oral Oral Oral Oral  SpO2: 98% 97% 100% 98%  Weight:      Height:        Intake/Output Summary (Last 24 hours) at 08/23/2022 1119 Last data filed at 08/23/2022 1025 Gross per 24 hour  Intake 360 ml  Output --  Net 360 ml   Filed Weights   08/18/22 1529  Weight: 76.7 kg    Data Reviewed: I have personally reviewed and interpreted daily labs, tele strips, imagings as discussed above. I reviewed all nursing notes, pharmacy notes, vitals, pertinent old records I have discussed plan of care as described above with RN  and patient/family.  CBC: Recent Labs  Lab 08/18/22 1541 08/20/22 0752 08/21/22 0622 08/22/22 0409 08/23/22 0303  WBC 6.9 8.3 7.6 6.2 6.1  NEUTROABS 5.3  --   --   --   --   HGB 9.3* 9.3* 9.1* 8.8* 8.6*  HCT 32.9* 31.9* 31.1* 29.9* 28.9*  MCV 84.6 82.0 82.7 82.4 81.9  PLT 301 272 272 300 [id number]   Basic Metabolic Panel: Recent Labs  Lab 08/19/22 1908 08/20/22 0752 08/21/22 0622 08/22/22 0409 08/23/22 0303  NA 144 139 141 139 138  K 3.9 4.2 4.2 3.6 3.4*  CL 112* 113* 107 109 108  CO2 21* 19* 22 22 22   GLUCOSE 105* 182* 173* 162* 157*  BUN 33* 34* 34* 32*  33*  CREATININE 1.84* 1.73* 1.70* 1.66* 1.58*  CALCIUM 8.7* 8.8* 9.0 8.4* 8.3*  MG 2.3 2.5* 2.3 2.3  --   PHOS  --  3.9 2.8 2.4*  --     Studies: No results found.  Scheduled Meds:  (feeding supplement) PROSource Plus  30 mL Oral TID BM   allopurinol  100 mg Oral Daily   aspirin EC  81 mg Oral Daily   atorvastatin  80 mg Oral QHS   calcitRIOL  0.25 mcg Oral Daily   calcium carbonate  1 tablet Oral TID   clopidogrel  75 mg Oral Daily   cyanocobalamin  1,000 mcg Intramuscular Daily   Followed by   Derrill Memo ON 08/27/2022] vitamin B-12  1,000 mcg Oral Daily   enoxaparin (LOVENOX) injection  40 mg Subcutaneous QHS   feeding supplement  1 Container Oral TID BM   insulin aspart  0-15 Units Subcutaneous TID WC   levothyroxine  200 mcg Oral Q M,W,F   losartan  50 mg Oral Daily   multivitamin with minerals  1 tablet Oral BID   pantoprazole  20 mg Oral Daily   zinc sulfate  220 mg Oral Daily   Continuous Infusions: PRN Meds: acetaminophen **OR** acetaminophen (TYLENOL) oral liquid 160 mg/5 mL **OR** acetaminophen, hydrALAZINE **OR** hydrALAZINE, ondansetron (ZOFRAN) IV, oxyCODONE, senna-docusate  Time spent: 35 minutes  Author: Val Riles. MD [facility] 08/23/2022 11:19 AM  To reach On-call, see care teams to locate the attending and reach out to them via [email or web address]. If 7PM-7AM, please contact night-coverage If you still have difficulty reaching the attending provider, please page the [facility] (Director on Call) for [facility] on amion for assistance.

## 2022-08-23 NOTE — TOC Progression Note (Signed)
Transition of Care [facility]) - Progression Note    Patient Details  Name: [name] MRN: [id number] Date of Birth: [birth year]  [facility] CM/SW Contact  Izola Price, RN Phone Number: 08/23/2022, 2:16 PM  Clinical Narrative:  3/17: Insurance authorization is [id number]. Called April at [facility] for admission details and some confusion about ongoing CIR/AIR re-evaluations. Appears she does not have the post discharge help needed per last CIR note. April informed and plan is to admit Monday. Simmie Davies RN CM          Expected Discharge Plan and Services                                               Social Determinants of Health (SDOH) Interventions SDOH Screenings   Food Insecurity: No Food Insecurity (08/19/2022)  Housing: Low Risk  (08/19/2022)  Transportation Needs: No Transportation Needs (08/19/2022)  Utilities: Not At Risk (08/19/2022)  Tobacco Use: Low Risk  (08/20/2022)    Readmission Risk Interventions     No data to display

## 2022-08-23 NOTE — Progress Notes (Signed)
PT Cancellation Note  Patient Details Name: [name] MRN: [id number] DOB: 09-Aug-1950   Cancelled Treatment:     PT attempt 2 x this date.  816:  First attempt pt eating breakfast requesting author return at later time. 1534: PT attempt. 2nd attempt. Pt politely refused endorsing severe fatigue and having had busy day. Acute PT will return tomorrow and continue to follow + progress per current POC.    Willette Pa 08/23/2022, 3:34 PM

## 2022-08-24 DIAGNOSIS — I639 Cerebral infarction, unspecified: Secondary | ICD-10-CM | POA: Diagnosis not present

## 2022-08-24 LAB — BASIC METABOLIC PANEL
Anion gap: 7 (ref 5–15)
BUN: 32 mg/dL — ABNORMAL HIGH (ref 8–23)
CO2: 22 mmol/L (ref 22–32)
Calcium: 8.4 mg/dL — ABNORMAL LOW (ref 8.9–10.3)
Chloride: 109 mmol/L (ref 98–111)
Creatinine, Ser: 1.46 mg/dL — ABNORMAL HIGH (ref 0.44–1.00)
GFR, Estimated: 38 mL/min — ABNORMAL LOW (ref >60)
Glucose, Bld: 156 mg/dL — ABNORMAL HIGH (ref 70–99)
Potassium: 3.7 mmol/L (ref 3.5–5.1)
Sodium: 138 mmol/L (ref 135–145)

## 2022-08-24 LAB — CBC
HCT: 30 % — ABNORMAL LOW (ref 36.0–46.0)
Hemoglobin: 9 g/dL — ABNORMAL LOW (ref 12.0–15.0)
MCH: 24.3 pg — ABNORMAL LOW (ref 26.0–34.0)
MCHC: 30 g/dL (ref 30.0–36.0)
MCV: 81.1 fL (ref 80.0–100.0)
Platelets: 311 10*3/uL (ref 150–400)
RBC: 3.7 MIL/uL — ABNORMAL LOW (ref 3.87–5.11)
RDW: 19.2 % — ABNORMAL HIGH (ref 11.5–15.5)
WBC: 6.5 10*3/uL (ref 4.0–10.5)
nRBC: 0 % (ref 0.0–0.2)

## 2022-08-24 LAB — PHOSPHORUS: Phosphorus: 2.3 mg/dL — ABNORMAL LOW (ref 2.5–4.6)

## 2022-08-24 LAB — GLUCOSE, CAPILLARY
Glucose-Capillary: 317 mg/dL — ABNORMAL HIGH (ref 70–99)
Glucose-Capillary: 230 mg/dL — ABNORMAL HIGH (ref 70–99)

## 2022-08-24 LAB — MAGNESIUM: Magnesium: 2.5 mg/dL — ABNORMAL HIGH (ref 1.7–2.4)

## 2022-08-24 MED ORDER — SENNOSIDES-DOCUSATE SODIUM 8.6-50 MG PO TABS: 1.0000 | ORAL_TABLET | Freq: Every evening | ORAL | Status: DC | PRN

## 2022-08-24 MED ORDER — LOSARTAN POTASSIUM 50 MG PO TABS: 50.0000 mg | ORAL_TABLET | Freq: Every day | ORAL | Status: DC

## 2022-08-24 MED ORDER — PROSOURCE PLUS PO LIQD: 30.0000 mL | Freq: Three times a day (TID) | ORAL | Status: DC

## 2022-08-24 MED ORDER — K PHOS MONO-SOD PHOS DI & MONO 155-852-130 MG PO TABS
500.0000 mg | ORAL_TABLET | Freq: Three times a day (TID) | ORAL | Status: DC
  Administered 2022-08-24: 500 mg via ORAL
  Filled 2022-08-24: qty 2

## 2022-08-24 MED ORDER — CLOPIDOGREL BISULFATE 75 MG PO TABS: 75.0000 mg | ORAL_TABLET | Freq: Every day | ORAL | Status: DC

## 2022-08-24 MED ORDER — CYANOCOBALAMIN 1000 MCG PO TABS: 1000.0000 ug | ORAL_TABLET | Freq: Every day | ORAL | 0 refills | Status: AC

## 2022-08-24 MED ORDER — ATORVASTATIN CALCIUM 80 MG PO TABS: 80.0000 mg | ORAL_TABLET | Freq: Every day | ORAL | Status: DC

## 2022-08-24 MED ORDER — CALCIUM CARBONATE ANTACID 500 MG PO CHEW: 1.0000 | CHEWABLE_TABLET | Freq: Two times a day (BID) | ORAL | 0 refills | Status: AC

## 2022-08-24 MED ORDER — FUROSEMIDE 40 MG PO TABS: 40.0000 mg | ORAL_TABLET | Freq: Every day | ORAL | Status: DC | PRN

## 2022-08-24 NOTE — Care Management Important Message (Signed)
Important Message  Patient Details  Name: [name] MRN: [id number] Date of Birth: 05-01-1951   Medicare Important Message Given:  Yes     Dannette Barbara 08/24/2022, 12:21 PM

## 2022-08-24 NOTE — Progress Notes (Signed)
Physical Therapy Treatment Patient Details Name: [name] MRN: [id number] DOB: [birth year] Today's Date: 08/24/2022   History of Present Illness 72 y.o. female with medical history significant of prior CVA on aspirin + Brilinta, hypertension, hyperlipidemia, hypothyroidism, HFrEF with HCM, CKD stage IV, type 2 diabetes, CAD, PAD, who presents to the ED due to difficulty speaking and acute vision changes. MRI significant for large acute L PCA infarct.    PT Comments    Patient was agreeable to PT with encouragement. The patient ambulated in the room with rolling walker with activity tolerance limited by fatigue. Verbal cues for improved gait pattern with limited carry over demonstrated. Patient also needs safety cues to avoid running into objects on the right side with assistance required for rolling walker negotiation. Recommend to continue PT to maximize independence and facilitate return to prior level of function.    Recommendations for follow up therapy are one component of a multi-disciplinary discharge planning process, led by the attending physician.  Recommendations may be updated based on patient status, additional functional criteria and insurance authorization.  Follow Up Recommendations  Acute inpatient rehab (3hours/day)     Assistance Recommended at Discharge Frequent or constant Supervision/Assistance  Patient can return home with the following A little help with walking and/or transfers;A lot of help with bathing/dressing/bathroom;Assistance with cooking/housework;Direct supervision/assist for medications management;Direct supervision/assist for financial management;Assist for transportation;Help with stairs or ramp for entrance   Equipment Recommendations   (to be determined at next level of care)    Recommendations for Other Services       Precautions / Restrictions Precautions Precautions: Fall Restrictions Weight Bearing Restrictions: No     Mobility  Bed  Mobility Overal bed mobility: Needs Assistance Bed Mobility: Supine to Sit, Sit to Supine     Supine to sit: Min assist Sit to supine: Min assist   General bed mobility comments: increased time and effort.    Transfers Overall transfer level: Needs assistance Equipment used: Rolling walker (2 wheels) Transfers: Sit to/from Stand Sit to Stand: Min assist           General transfer comment: 2 bouts of standing performed. cues for hand placement for safety. lifting assistance required    Ambulation/Gait Ambulation/Gait assistance: Min assist Gait Distance (Feet): 16 Feet (x 2 bouts) Assistive device: Rolling walker (2 wheels) Gait Pattern/deviations: Step-through pattern, Trunk flexed, Narrow base of support Gait velocity: decreased     General Gait Details: cues to widen base of support with no carry over demonstrated. patient also running into objects on the right side, requiring moderate verbal cues (especially with right turns) and physical assistance for rolling walker negotiation   Stairs             Wheelchair Mobility    Modified Rankin (Stroke Patients Only)       Balance Overall balance assessment: Needs assistance Sitting-balance support: Feet supported Sitting balance-Leahy Scale: Fair     Standing balance support: Bilateral upper extremity supported Standing balance-Leahy Scale: Fair Standing balance comment: with rolling walker for UE support in standing                            Cognition Arousal/Alertness: Awake/alert Behavior During Therapy: Flat affect Overall Cognitive Status: Impaired/Different from baseline Area of Impairment: Following commands, Safety/judgement, Problem solving                       Following Commands:  Follows one step commands with increased time Safety/Judgement: Decreased awareness of safety, Decreased awareness of deficits   Problem Solving: Slow processing          Exercises       General Comments General comments (skin integrity, edema, etc.): patient had spilled a drink before PT arrival with liquid all over the bed side table, on the sheets, and in the floor. encouraged patient to scan environment for safety due to vision deficits, including her tray table.      Pertinent Vitals/Pain Pain Assessment Pain Assessment: No/denies pain    Home Living                          Prior Function            PT Goals (current goals can now be found in the care plan section) Acute Rehab PT Goals Patient Stated Goal: to feel better PT Goal Formulation: With patient Time For Goal Achievement: 09/02/22 Potential to Achieve Goals: Good Progress towards PT goals: Progressing toward goals    Frequency    7X/week      PT Plan Current plan remains appropriate    Co-evaluation              AM-PAC PT "6 Clicks" Mobility   Outcome Measure  Help needed turning from your back to your side while in a flat bed without using bedrails?: None Help needed moving from lying on your back to sitting on the side of a flat bed without using bedrails?: A Little Help needed moving to and from a bed to a chair (including a wheelchair)?: A Little Help needed standing up from a chair using your arms (e.g., wheelchair or bedside chair)?: A Little Help needed to walk in hospital room?: A Little Help needed climbing 3-5 steps with a railing? : A Lot 6 Click Score: 18    End of Session   Activity Tolerance: Patient limited by fatigue Patient left: in bed;with call bell/phone within reach;with bed alarm set   PT Visit Diagnosis: Difficulty in walking, not elsewhere classified (R26.2);Hemiplegia and hemiparesis Hemiplegia - Right/Left: Right Hemiplegia - dominant/non-dominant: Dominant Hemiplegia - caused by: Cerebral infarction     Time: 0925-0943 PT Time Calculation (min) (ACUTE ONLY): 18 min  Charges:  $Gait Training: 8-22 mins                     Minna Merritts, PT, MPT   Percell Locus 08/24/2022, 11:59 AM

## 2022-08-24 NOTE — Progress Notes (Signed)
Occupational Therapy Treatment Patient Details Name: [name] MRN: [id number] DOB: 1951-03-05 Today's Date: 08/24/2022   History of present illness 72 y.o. female with medical history significant of prior CVA on aspirin + Brilinta, hypertension, hyperlipidemia, hypothyroidism, HFrEF with HCM, CKD stage IV, type 2 diabetes, CAD, PAD, who presents to the ED due to difficulty speaking and acute vision changes. MRI significant for large acute L PCA infarct.   OT comments  Patient received in semi-fowler's position and agreeable to OT. Tx session targeted increasing activity tolerance for improved ADL completion. Pt reported generalized fatigue from not sleeping well last night. Limited activity tolerance this date. Attempting functional mobility to bedside sink in order to complete standing grooming tasks. Chair follow completed for safety. Pt only made it ~5 ft before experiencing posterior LOB toward R side. Pt required Max A to correct LOB and safely sit down in chair. Pt returned to EOB for safety in order to complete grooming and UB dressing tasks. Pt left as received with all needs in reach. Pt is making progress toward goal completion. D/C recommendation remains appropriate. OT will continue to follow acutely.    Recommendations for follow up therapy are one component of a multi-disciplinary discharge planning process, led by the attending physician.  Recommendations may be updated based on patient status, additional functional criteria and insurance authorization.    Follow Up Recommendations  Acute inpatient rehab (3hours/day)     Assistance Recommended at Discharge Frequent or constant Supervision/Assistance  Patient can return home with the following  A lot of help with walking and/or transfers;A lot of help with bathing/dressing/bathroom;Assistance with cooking/housework;Assist for transportation;Help with stairs or ramp for entrance;Direct supervision/assist for financial  management;Direct supervision/assist for medications management   Equipment Recommendations  Other (comment) (defer to next venue of care)    Recommendations for Other Services      Precautions / Restrictions Precautions Precautions: Fall Restrictions Weight Bearing Restrictions: No       Mobility Bed Mobility Overal bed mobility: Needs Assistance Bed Mobility: Supine to Sit, Sit to Supine     Supine to sit: Min assist Sit to supine: Min guard        Transfers Overall transfer level: Needs assistance Equipment used: Rolling walker (2 wheels) Transfers: Sit to/from Stand, Bed to chair/wheelchair/BSC Sit to Stand: Min assist     Step pivot transfers: Min assist     General transfer comment: STS from EOB and recliner, step pivot transfer from recliner>EOB     Balance Overall balance assessment: Needs assistance Sitting-balance support: Feet supported Sitting balance-Leahy Scale: Fair     Standing balance support: Bilateral upper extremity supported, During functional activity, Reliant on assistive device for balance Standing balance-Leahy Scale: Poor         ADL either performed or assessed with clinical judgement   ADL Overall ADL's : Needs assistance/impaired   Eating/Feeding Details (indicate cue type and reason): pt found with meal tray in front of her - partial breakfast eaten, pt denies any difficulty cutting up food or seeing items on plate. OT noting drink spilled on gown.  Grooming: Set up;Sitting;Wash/dry face;Supervision/safety           Upper Body Dressing : Sitting;Minimal assistance Upper Body Dressing Details (indicate cue type and reason): to don/doff gown        Extremity/Trunk Assessment Upper Extremity Assessment Upper Extremity Assessment: Generalized weakness   Lower Extremity Assessment Lower Extremity Assessment: Generalized weakness        Vision Baseline  Vision/History: 1 Wears glasses Patient Visual Report:  Peripheral vision impairment Vision Assessment?: Vision impaired- to be further tested in functional context Additional Comments: significantly decreased R peripheral vision   Perception     Praxis      Cognition Arousal/Alertness: Awake/alert Behavior During Therapy: Flat affect Overall Cognitive Status: Impaired/Different from baseline Area of Impairment: Following commands, Safety/judgement, Problem solving, Orientation                 Orientation Level: Disoriented to, Time     Following Commands: Follows one step commands with increased time Safety/Judgement: Decreased awareness of safety, Decreased awareness of deficits   Problem Solving: Slow processing General Comments: Pt oriented to self and location. Reported day is Wednesday and month is September. Expressive aphasia/word finding difficulties noted. Required VC for safety awareness t/o session.        Exercises Other Exercises Other Exercises: Education provided re: visual deficits and visual compensatory strategies (environmental scanning)      Shoulder Instructions       General Comments      Pertinent Vitals/ Pain       Pain Assessment Pain Assessment: No/denies pain  Home Living              Prior Functioning/Environment              Frequency  Min 3X/week        Progress Toward Goals  OT Goals(current goals can now be found in the care plan section)  Progress towards OT goals: Progressing toward goals  Acute Rehab OT Goals Patient Stated Goal: to return to PLOF OT Goal Formulation: With patient Time For Goal Achievement: 09/02/22 Potential to Achieve Goals: [place] Discharge plan remains appropriate;Frequency remains appropriate    Co-evaluation                 AM-PAC OT "6 Clicks" Daily Activity     Outcome Measure   Help from another person eating meals?: A Little Help from another person taking care of personal grooming?: A Little Help from another  person toileting, which includes using toliet, bedpan, or urinal?: A Lot Help from another person bathing (including washing, rinsing, drying)?: A Lot Help from another person to put on and taking off regular upper body clothing?: A Little Help from another person to put on and taking off regular lower body clothing?: A Lot 6 Click Score: 15    End of Session Equipment Utilized During Treatment: Rolling walker (2 wheels);Gait belt  OT Visit Diagnosis: Unsteadiness on feet (R26.81);Repeated falls (R29.6);Muscle weakness (generalized) (M62.81)   Activity Tolerance Patient tolerated treatment well;Patient limited by fatigue   Patient Left in bed;with call bell/phone within reach;with bed alarm set   Nurse Communication Mobility status        Time: [id number] OT Time Calculation (min): 16 min  Charges: OT General Charges $OT Visit: 1 Visit OT Treatments $Self Care/Home Management : 8-22 mins  [facility] MS, OTR/L ascom [phone]  08/24/22, 3:25 PM

## 2022-08-24 NOTE — TOC Transition Note (Signed)
Transition of Care [facility]) - CM/SW Discharge Note   Patient Details  Name: [name] MRN: [id number] Date of Birth: July 14, 1950  Transition of [facility]) CM/SW Contact:  Gerilyn Pilgrim, LCSW Phone Number: 08/24/2022, 11:26 AM   Clinical Narrative:  Pt to discharge to [place]. POA Marie notified. [name] reports she will transport patient around 2pm. RN and facility made aware.   CSW will send DC summary when available RN given number for report. Olivia Mackie with [employer] Notified.             Patient Goals and CMS Choice      Discharge Placement                         Discharge Plan and Services Additional resources added to the After Visit Summary for                                       Social Determinants of Health (SDOH) Interventions SDOH Screenings   Food Insecurity: No Food Insecurity (08/19/2022)  Housing: Low Risk  (08/19/2022)  Transportation Needs: No Transportation Needs (08/19/2022)  Utilities: Not At Risk (08/19/2022)  Tobacco Use: Low Risk  (08/20/2022)     Readmission Risk Interventions     No data to display

## 2022-08-24 NOTE — Progress Notes (Signed)
Speech Language Pathology Treatment: Cognitive-Linquistic  Patient Details Name: [name] MRN: [id number] DOB: September 03, 1950 Today's Date: 08/24/2022 Time: [id number] SLP Time Calculation (min) (ACUTE ONLY): 35 min  Assessment / Plan / Recommendation Clinical Impression  Pt seen today for cognitive-linguistic tx. Pt sitting upright in bed w/ breakfast tray in front of her upon ST arrival. Pt agreeable to tx and alert and cooperative t/o session. Pt expressed frustrations w/ situation at home and difficulty communicating at length. Pt left sitting upright in bed w/ call button in reach and bed alarm set.  Pt continues to presents with a mainly fluent aphasia with semantic and phonemic paraphasias as well as perseveration. [place] of error free speech as well as emerging successful use of circumlocution noted. During structured tasks and conversational exchanges, pt benefited from mod/max verbal cues for use of circumlocution strategies. During informal tasks, pt able to follow 1-step commands w/ 100% accuracy and answer basic yes/no questions with 90% accuracy independently. Pt able to expressively identify objects w/ 80% accuracy given binary choices; continues to have difficulty w/ confrontational naming w/o verbal choices.  Pt engaged in lengthy conversation regarding previous places of living and difficult home situation. Pt appears aware of her deficits c/b reporting "I have the word but I can't say it." Pt expressed wants and needs to gain more independence and complete tasks as home, but noted difficulty communicating this to her close friends. Pt often described the situation as "frustrating" and "maddening." Support counseling provided. Despite perseveration and circumlocutions, pt clearly communicated this situation to the ST including consistent word retrieval of content words and less difficulty remaining on task compared to assessment done on 3/13. Pt benefited from visual aid/visual cues,  binary choices, and verbal cues.   Recommend ongoing SLP services acute and intensive SLP services post-acute for aphasia. SLP to continue to f/u while pt in house.    HPI HPI: Per H&P, "is a 72 y.o. female with medical history significant of prior CVA on aspirin + Brilinta, hypertension, hyperlipidemia, hypothyroidism, HFrEF with HCM, CKD stage IV, type 2 diabetes, CAD, PAD, who presents to the ED due to difficulty speaking.  History limited due to aphasia.     [name] is unable to quantify when her symptoms began.  She is able to express difficulty with her speech and vision.  She describes seeing 2 different images from each eye and feels that they are not seeing together.  She denies any pain, difficulty breathing, focal weakness or lower extremity swelling.  She states aspirin was last taken this morning and she believes Brilinta was last taken either yesterday or the day before.     Per chart review, patient has a friend that checks on her daily.  Yesterday at approximately 1 PM, patient was able to speak without any difficulty.  When her friend called her today in the morning, patient was noted to have difficulty with her speech.  Due to this, EMS was called." Per Brain MRI, pt w/ "1. Large acute infarct of the left PCA territory. No acute  hemorrhage or mass effect.  2. Abnormal left ICA flow void, consistent with occlusion or severe  stenosis.  3. 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  Recommendations may  be updated based on patient status, additional functional criteria and insurance authorization.    Recommendations                   Follow Up Recommendations: Acute inpatient rehab (3hours/day) Assistance recommended at discharge: Frequent or  constant Supervision/Assistance SLP Visit Diagnosis: Aphasia (R47.01) Plan: Continue with current plan of care         [place], Speech Pathology   Randall Hiss  08/24/2022, 12:18 PM

## 2022-08-24 NOTE — Discharge Summary (Signed)
[facility] Discharge Summary   Patient: [name] [id number]  PCP: Kirk Ruths, MD  Date of admission: 08/18/2022   Date of discharge:  08/24/2022     Discharge Diagnoses:  Active Problems:   Acute CVA (cerebrovascular accident) ([place])   HYPERTENSION, BENIGN   Hypothyroidism   CKD (chronic kidney disease) stage 4, GFR 15-29 ml/min (HCC)   Type 2 diabetes mellitus without complication (HCC)   HFrEF (heart failure with reduced ejection fraction) (HCC)   Malnutrition of moderate degree   Admitted From: Home Disposition:  SNF   Recommendations for Outpatient Follow-up:  Follow-up with PCP, patient to be seen by an MD in 1 to 2 days and monitor BP and titrate medications accordingly.  Increased losartan from 25 to 50 mg and discontinued clonidine.  Continue aspirin, started Plavix as patient was not taking Brilinta regularly. Follow-up with neurology in 1 to 2 weeks Follow-up with vascular surgery for left ICA stenosis in 1 to 2 weeks Follow with cardiology as per schedule for CHF, currently euvolemic, changed to Lasix to as needed and continue fluid restriction 1.5 L/day. Follow up LABS/TEST:     Contact information for follow-up providers     Kirk Ruths, MD Follow up.   Specialty: Internal Medicine Contact information: [place] [address] [phone]              Contact information for after-discharge care     Destination     [facility], [place] Preferred SNF .   Service: Skilled Nursing Contact information: [place] [phone]                    Diet recommendation: Cardiac diet  Activity: The patient is advised to gradually reintroduce usual activities, as tolerated  Discharge Condition: stable  Code Status: DNR   History of present illness: As per the H and P dictated on admission Hospital Course:   [name] is a 72 y.o. female with medical history significant of prior CVA on aspirin + Brilinta, hypertension, hyperlipidemia, hypothyroidism, HFrEF with HCM, CKD stage IV, type 2 diabetes, CAD, PAD, who presents to the ED due to difficulty speaking.  History limited due to aphasia.  [name] is unable to quantify when her symptoms began.  She is able to express difficulty with her speech and vision.  She describes seeing 2 different images from each eye and feels that they are not seeing together. Per chart review, patient has a friend that checks on her daily. On 3/11 at approximately 1 PM, patient was able to speak without any difficulty.  When her friend called her today in the morning, patient was noted to have difficulty with her speech.  Due to this, EMS was called.   ED course: On arrival to the ED, patient was normotensive at 137/68 with heart rate of 70.  She was saturating at 100% on room air. Initial workup notable for WBC of 6.9, hemoglobin of 9.3, glucose of 219, bicarb 20, BUN 38, creatinine 2.0, with GFR of 26.  Alcohol level negative.  INR within normal limits at 1.2.  CT of the head was obtained that demonstrated a large region of parenchymal hypodensity in the left parietal occipital lobe consistent with acute or subacute infarction.  No hemorrhage or midline shift.  Neurology consulted.  [facility] contacted for admission.    Assessment and Plan: # Acute CVA  left PCA territory Prior history of CVA with left-sided carotid artery disease.  Holter monitor negative for arrhythmia.  Patient underwent carotid angiography in December 2024 that demonstrated greater than 70% stenosis in the distal common carotid artery and greater than 70% stenosis at the origin of the left internal carotid artery.  Per medication reconciliation, patient's last refill of Brilinta was in early February 2024 with only 15-day supply.  CT evidence of a large left parietal occipital infarct  MRI brain:  Large  acute infarct of the left PCA territory. No acute hemorrhage or mass effect. Abnormal left ICA flow void, consistent with occlusion or severe stenosis. Multiple old small vessel infarcts of the deep white matter and cerebellum. S/p permissive hypertension for 48 hours, keep normotensive now.  Neurology was consulted. Continue Aspirin 81 mg pod, started Plavix 75 mg po daily and Lipitor 80 mg p.o. daily. Patient should remain on DAPT for at least 90 days and single antiplatelet for life (duration of DAPT after 90 days will be referred to vascular surgery).  Neurology recommend ambulatory cardiac monitoring given LA dilation on TTE.  Texted to cardiology PA for Zio patch/cardiac monitor to rule out occult arrhythmias. TTE LVEF 35 to 40%, global hypokinesis with severe hypokinesis of inferior/posterior, inferior lateral and apical wall.  Moderate asymmetric LV hypertrophy, severe hypertrophy of basal-septal segments.  Grade 1 diastolic dysfunction. LA mild to moderate dilated, moderate MR, negative PFO.  PT/OT eval done, recommend acute rehab placement. # Left carotid stenosis: Vascular surgery consulted, recommended no intervention at this time due to reperfusion hemorrhage. Follow-up with vascular surgery in 2 weeks for possible intervention on her carotid stenosis.  As per neuro patient can continue dual antiplatelet therapy for 90 days and then can decrease to single antiplatelet agent depending on vascular surgery decision. # Hypertension and chronic systolic CHF, euvolemic. S/p permissive hypertension due to acute CVA for 48 hrs. Held home medications clonidine 0.1 mg twice daily, Lasix 40 mg p.o. daily, losartan 25 mg p.o. daily. On 3/16 increased losartan 50 mg p.o. daily with holding parameters.  Discontinued clonidine on discharge and started Lasix 40 as needed daily for volume overload.  Continue to monitor and continue fluid restriction 1.5 L/day.  Follow with cardiology as per schedule. #  Hypothyroid, continue Synthroid # CKD stage IV, baseline creatinine is around 2, Stable  # Mild metabolic acidosis, bicarb 19, Resolved, s/p oral bicarbonate  # Hypokalemia, potassium repleted cautiously and resolved  # Diabetes mellitus type 2, Hb A1c 7.7 well controlled. Currently patient is not on any medication, continue diabetic diet. S/p NovoLog sliding scale during hospital stay.  # Vitamin B12 level 232, goal >400, s/p vitamin B12 1000 mcg IM injection during hospital stay followed by oral supplement. # Anemia of chronic disease, vitamin B12 level at lower end, iron and folate within normal range.  Body mass index is 27.28 kg/m.  Nutrition Problem: Moderate Malnutrition Etiology: chronic illness (CHF) Nutrition Interventions: Interventions: Ensure Enlive (each supplement provides 350kcal and 20 grams of protein), MVI, Liberalize Diet  Patient was seen by physical therapy, who recommended Therapy, which was arranged. On the day of the discharge the patient's vitals were stable, and no other acute medical condition were reported by patient. the patient was felt safe to be discharge at [facility].  Consultants: Neurology, vascular surgery and palliative care Procedures: None  Discharge Exam: General: Appear in no distress, no Rash; Oral Mucosa Clear, moist. Cardiovascular: S1 and S2 Present, no Murmur, Respiratory: normal respiratory  effort, Bilateral Air entry present and no Crackles, no wheezes Abdomen: Bowel Sound present, Soft and no tenderness, no hernia Extremities: no Pedal edema, no calf tenderness Neurology: alert and oriented to time, place, and person.  Some blurriness in the right side of the vision, speech is improving.  Denies any numbness or weakness.  No focal deficits. affect appropriate.  Filed Weights   08/18/22 1529  Weight: 76.7 kg   Vitals:   08/24/22 0439 08/24/22 0739  BP: (!) 141/63 (!) 125/56  Pulse: 76 81  Resp: 16 18  Temp: 98.5 F (36.9 C) 98.2 F  (36.8 C)  SpO2: 99% 100%    DISCHARGE MEDICATION: Allergies as of 08/24/2022       Reactions   Niacin Other (See Comments)   Heart Attack Other reaction(s): Other (see comments) Heart Attack   Tramadol    Other reaction(s): Other (see comments) Intolerance, makes woozy   Ultram [tramadol Hcl]    Intolerance, makes woozy   Baclofen Other (See Comments)   Dizziness Other reaction(s): Other (see comments) Dizziness   Cardura [doxazosin Mesylate] Other (See Comments)   Dizzy with passing out spells   Doxazosin    Other reaction(s): Other (see comments) Dizzy with passing out spells Dizzy with passing out spells   Morphine And Related Nausea And Vomiting, Nausea Only   Other Rash   Gold metal (nickel in the gold) Gold metal Gold metal (nickel in the gold)        Medication List     STOP taking these medications    cloNIDine 0.1 MG tablet Commonly known as: CATAPRES   potassium chloride 10 MEQ tablet Commonly known as: KLOR-CON   ticagrelor 90 MG Tabs tablet Commonly known as: BRILINTA       TAKE these medications    (feeding supplement) PROSource Plus liquid Take 30 mLs by mouth 3 (three) times daily between meals.   allopurinol 100 MG tablet Commonly known as: ZYLOPRIM Take 100 mg by mouth daily.   aspirin EC 81 MG tablet Take 1 tablet (81 mg total) by mouth daily.   atorvastatin 80 MG tablet Commonly known as: LIPITOR Take 1 tablet (80 mg total) by mouth at bedtime. What changed:  medication strength how much to take   blood glucose meter kit and supplies Kit Dispense based on patient and insurance preference. Use up to four times daily as directed.   calcitRIOL 0.25 MCG capsule Commonly known as: ROCALTROL Take 0.25 mcg by mouth daily.   calcium carbonate 500 MG chewable tablet Commonly known as: TUMS - dosed in mg elemental calcium Chew 1 tablet (200 mg of elemental calcium total) by mouth 2 (two) times daily with a meal.   clopidogrel  75 MG tablet Commonly known as: PLAVIX Take 1 tablet (75 mg total) by mouth daily. Start taking on: August 25, 2022   cyanocobalamin 1000 MCG tablet Take 1 tablet (1,000 mcg total) by mouth daily. Start taking on: August 27, 2022   diphenhydrAMINE 50 MG capsule Commonly known as: BENADRYL Take 50 mg by mouth at bedtime as needed for sleep.   furosemide 40 MG tablet Commonly known as: LASIX Take 1 tablet (40 mg total) by mouth daily as needed for fluid. What changed:  when to take this reasons to take this   levothyroxine 200 MCG tablet Commonly known as: SYNTHROID Take 200 mcg by mouth every Monday, Wednesday, and Friday.   losartan 50 MG tablet Commonly known as: COZAAR Take 1 tablet (50  mg total) by mouth daily. What changed:  medication strength how much to take   multivitamin with minerals Tabs tablet Take 1 tablet by mouth daily.   pantoprazole 20 MG tablet Commonly known as: PROTONIX Take 20 mg by mouth daily.   senna-docusate 8.6-50 MG tablet Commonly known as: Senokot-S Take 1 tablet by mouth at bedtime as needed for moderate constipation or mild constipation.       Allergies  Allergen Reactions   Niacin Other (See Comments)    Heart Attack Other reaction(s): Other (see comments) Heart Attack   Tramadol     Other reaction(s): Other (see comments) Intolerance, makes woozy   Ultram [Tramadol Hcl]     Intolerance, makes woozy   Baclofen Other (See Comments)    Dizziness Other reaction(s): Other (see comments) Dizziness   Cardura [Doxazosin Mesylate] Other (See Comments)    Dizzy with passing out spells   Doxazosin     Other reaction(s): Other (see comments) Dizzy with passing out spells Dizzy with passing out spells   Morphine And Related Nausea And Vomiting and Nausea Only   Other Rash    Gold metal (nickel in the gold) Gold metal Gold metal (nickel in the gold)   Discharge Instructions     Ambulatory referral to Neurology   Complete by:  As directed    Call MD for:   Complete by: As directed    Headache or dizziness, weakness or numbness, any new neurological changes.   Call MD for:  difficulty breathing, headache or visual disturbances   Complete by: As directed    Call MD for:  extreme fatigue   Complete by: As directed    Call MD for:  persistant dizziness or light-headedness   Complete by: As directed    Call MD for:  persistant nausea and vomiting   Complete by: As directed    Call MD for:  severe uncontrolled pain   Complete by: As directed    Diet - low sodium heart healthy   Complete by: As directed    Discharge instructions   Complete by: As directed    Follow-up with PCP, patient to be seen by an MD in 1 to 2 days and monitor BP and titrate medications accordingly.  Increased losartan from 25 to 50 mg and discontinued clonidine.  Continue aspirin, started Plavix as patient was not taking Brilinta regularly. Follow-up with neurology in 1 to 2 weeks Follow-up with vascular surgery for left ICA stenosis in 1 to 2 weeks Follow with cardiology as per schedule for CHF, currently euvolemic, changed to Lasix to as needed and continue fluid restriction 1.5 L/day.   Increase activity slowly   Complete by: As directed        The results of significant diagnostics from this hospitalization (including imaging, microbiology, ancillary and laboratory) are listed below for reference.    Significant Diagnostic Studies: MR ANGIO HEAD WO CONTRAST  Result Date: 08/20/2022 CLINICAL DATA:  Neuro deficit, acute, stroke suspected. Acute left PCA infarct on recent MRI. EXAM: MRA NECK WITHOUT CONTRAST MRA HEAD WITHOUT CONTRAST TECHNIQUE: Angiographic images of the Circle of Willis were acquired using MRA technique without intravenous contrast. COMPARISON:  Head MRA 05/19/2022. FINDINGS: MRA NECK FINDINGS Aortic arch: Standard 3 vessel aortic arch. Patent brachiocephalic and subclavian arteries. Right carotid system: Patent with  mild atherosclerotic type irregularity in the carotid bulb but no evidence of a significant stenosis or dissection. Left carotid system: The common carotid artery is grossly patent though  with diminished flow related enhancement. The ICA appears to be occluded at its origin. Vertebral arteries: The vertebral arteries are patent and codominant with antegrade flow bilaterally and no evidence of a significant stenosis or dissection allowing for suboptimal assessment of the proximal V1 segments due to motion and noncontrast technique. Other: None. MRA HEAD FINDINGS Anterior circulation: The intracranial right ICA is patent with an apparent new severe distal supraclinoid stenosis favored to be artifactual given a grossly normal appearance on the neck MRA. There is chronic occlusion of the left ICA with reconstitution of the terminus. The right MCA is patent without evidence of a significant proximal stenosis. The left M1 segment is patent without evidence of a significant focal stenosis, however there is chronic attenuation of left MCA branch vessels including chronic occlusion of the M2 inferior division proximally. The ACAs are patent with unchanged severe proximal right A1 and multifocal left A2 stenoses. No aneurysm is identified. Posterior circulation: The intracranial vertebral arteries are patent to the basilar. Patent PICA and SCA origins are seen bilaterally. Abnormal appearance of the proximal basilar artery is unchanged and may reflect an incidental fenestration. The remainder of the basilar artery is normal in appearance. There is a patent large right posterior communicating artery with hypoplastic right P1 segment. Both PCAs are patent without evidence of a flow limiting proximal stenosis. There may be a mild proximal left P2 stenosis, and there is a branch vessel irregularity bilaterally including a chronic severe stenosis of a superior proximal P3 branch on the left. No aneurysm is identified. Anatomic  variants: None. Other: None. IMPRESSION: 1. 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 2. Chronic left M2 branch occlusion. 3. Chronic severe right A1, left A2, and left P3 stenoses. Electronically Signed   By: Logan Bores M.D.   On: 08/20/2022 16:48   MR ANGIO NECK WO CONTRAST  Result Date: 08/20/2022 CLINICAL DATA:  Neuro deficit, acute, stroke suspected. Acute left PCA infarct on recent MRI. EXAM: MRA NECK WITHOUT CONTRAST MRA HEAD WITHOUT CONTRAST TECHNIQUE: Angiographic images of the Circle of Willis were acquired using MRA technique without intravenous contrast. COMPARISON:  Head MRA 05/19/2022. FINDINGS: MRA NECK FINDINGS Aortic arch: Standard 3 vessel aortic arch. Patent brachiocephalic and subclavian arteries. Right carotid system: Patent with mild atherosclerotic type irregularity in the carotid bulb but no evidence of a significant stenosis or dissection. Left carotid system: The common carotid artery is grossly patent though with diminished flow related enhancement. The ICA appears to be occluded at its origin. Vertebral arteries: The vertebral arteries are patent and codominant with antegrade flow bilaterally and no evidence of a significant stenosis or dissection allowing for suboptimal assessment of the proximal V1 segments due to motion and noncontrast technique. Other: None. MRA HEAD FINDINGS Anterior circulation: The intracranial right ICA is patent with an apparent new severe distal supraclinoid stenosis favored to be artifactual given a grossly normal appearance on the neck MRA. There is chronic occlusion of the left ICA with reconstitution of the terminus. The right MCA is patent without evidence of a significant proximal stenosis. The left M1 segment is patent without evidence of a significant focal stenosis, however there is chronic attenuation of left MCA branch vessels including chronic occlusion of the M2 inferior division  proximally. The ACAs are patent with unchanged severe proximal right A1 and multifocal left A2 stenoses. No aneurysm is identified. Posterior circulation: The intracranial vertebral arteries are patent to the basilar. Patent  PICA and SCA origins are seen bilaterally. Abnormal appearance of the proximal basilar artery is unchanged and may reflect an incidental fenestration. The remainder of the basilar artery is normal in appearance. There is a patent large right posterior communicating artery with hypoplastic right P1 segment. Both PCAs are patent without evidence of a flow limiting proximal stenosis. There may be a mild proximal left P2 stenosis, and there is a branch vessel irregularity bilaterally including a chronic severe stenosis of a superior proximal P3 branch on the left. No aneurysm is identified. Anatomic variants: None. Other: None. IMPRESSION: 1. 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 2. Chronic left M2 branch occlusion. 3. Chronic severe right A1, left A2, and left P3 stenoses. Electronically Signed   By: Logan Bores M.D.   On: 08/20/2022 16:48   ECHOCARDIOGRAM COMPLETE  Result Date: 08/20/2022    ECHOCARDIOGRAM REPORT   Patient Name:   LATRICE BEVENS Capley Date of Exam: 08/19/2022 Medical Rec #:  [id number]      Height:       66.0 in Accession #:    [id number]     Weight:       169.0 lb Date of Birth:  1951/01/19      BSA:          1.862 m Patient Age:    72 years       BP:           140/88 mmHg Patient Gender: F              HR:           70 bpm. Exam Location:  [facility] Procedure: 2D Echo, Cardiac Doppler and Color Doppler Indications:     I63.9 Stroke  History:         Patient has prior history of Echocardiogram examinations, most                  recent 05/20/2022. CHF, Previous Myocardial Infarction and CAD,                  CVA, Signs/Symptoms:Dyspnea; Risk Factors:Hypertension,                  Diabetes, Dyslipidemia and Sleep Apnea.  Chronic kidney disease.  Sonographer:     Cresenciano Lick RDCS Referring Phys:  [id number] Val Riles Diagnosing Phys: Ida Rogue MD IMPRESSIONS  1. Left ventricular ejection fraction, by estimation, is 35 to 40%. The left ventricle has moderately decreased function. The left ventricle demonstrates mild global hypokinesis with severe hypokinesis of the infero/posterior, inferolateral and apical wall.  2. There is moderate asymmetric left ventricular hypertrophy of the septal wall, severe hypertrophy of the basal-septal segments. Left ventricular diastolic parameters are consistent with Grade I diastolic dysfunction (impaired relaxation).  3. Right ventricular systolic function is normal. The right ventricular size is normal.  4. Left atrial size was mild to moderately dilated.  5. The mitral valve is normal in structure. Mild to moderate mitral valve regurgitation. No evidence of mitral stenosis.  6. The aortic valve is normal in structure. There is mild calcification of the aortic valve. Aortic valve regurgitation is mild. Aortic valve sclerosis is present, with no evidence of aortic valve stenosis.  7. There is borderline dilatation of the ascending aorta, measuring 38 mm.  8. The inferior vena cava is normal in size with greater than 50% respiratory variability, suggesting right atrial pressure of 3 mmHg.  FINDINGS  Left Ventricle: Left ventricular ejection fraction, by estimation, is 35 to 40%. The left ventricle has moderately decreased function. The left ventricle demonstrates global hypokinesis. The left ventricular internal cavity size was normal in size. There is moderate asymmetric left ventricular hypertrophy of the basal-septal and septal segments. Left ventricular diastolic parameters are consistent with Grade I diastolic dysfunction (impaired relaxation). Right Ventricle: The right ventricular size is normal. No increase in right ventricular wall thickness. Right ventricular systolic function  is normal. Left Atrium: Left atrial size was mild to moderately dilated. Right Atrium: Right atrial size was normal in size. Pericardium: There is no evidence of pericardial effusion. Mitral Valve: The mitral valve is normal in structure. There is mild calcification of the mitral valve leaflet(s). Mild mitral annular calcification. Mild to moderate mitral valve regurgitation. No evidence of mitral valve stenosis. Tricuspid Valve: The tricuspid valve is normal in structure. Tricuspid valve regurgitation is not demonstrated. No evidence of tricuspid stenosis. Aortic Valve: The aortic valve is normal in structure. There is mild calcification of the aortic valve. Aortic valve regurgitation is mild. Aortic valve sclerosis is present, with no evidence of aortic valve stenosis. Pulmonic Valve: The pulmonic valve was normal in structure. Pulmonic valve regurgitation is not visualized. No evidence of pulmonic stenosis. Aorta: The aortic root is normal in size and structure. There is borderline dilatation of the ascending aorta, measuring 38 mm. Venous: The inferior vena cava is normal in size with greater than 50% respiratory variability, suggesting right atrial pressure of 3 mmHg. IAS/Shunts: No atrial level shunt detected by color flow Doppler.  LEFT VENTRICLE PLAX 2D LVIDd:         4.20 cm   Diastology LVIDs:         3.30 cm   LV e' medial:    3.92 cm/s LV PW:         1.70 cm   LV E/e' medial:  15.0 LV IVS:        1.70 cm   LV e' lateral:   5.60 cm/s LVOT diam:     2.10 cm   LV E/e' lateral: 10.5 LV SV:         71 LV SV Index:   38 LVOT Area:     3.46 cm  RIGHT VENTRICLE RV Basal diam:  4.00 cm RV S prime:     10.75 cm/s TAPSE (M-mode): 2.3 cm LEFT ATRIUM             Index        RIGHT ATRIUM           Index LA diam:        4.80 cm 2.58 cm/m   RA Area:     12.00 cm LA Vol (A2C):   72.9 ml 39.16 ml/m  RA Volume:   27.30 ml  14.66 ml/m LA Vol (A4C):   84.3 ml 45.28 ml/m LA Biplane Vol: 78.8 ml 42.33 ml/m  AORTIC  VALVE LVOT Vmax:   92.97 cm/s LVOT Vmean:  61.833 cm/s LVOT VTI:    0.204 m  AORTA Ao Root diam: 3.70 cm Ao Asc diam:  3.80 cm MITRAL VALVE MV Area (PHT): 3.27 cm    SHUNTS MV Decel Time: 232 msec    Systemic VTI:  0.20 m MV E velocity: 58.70 cm/s  Systemic Diam: 2.10 cm MV A velocity: 95.90 cm/s MV E/A ratio:  0.61 Ida Rogue MD Electronically signed by Ida Rogue MD Signature Date/Time: 08/20/2022/11:30:50 AM  Final    MR BRAIN WO CONTRAST  Result Date: 08/18/2022 CLINICAL DATA:  Acute neurologic deficit EXAM: MRI HEAD WITHOUT CONTRAST TECHNIQUE: Multiplanar, multiecho pulse sequences of the brain and surrounding structures were obtained without intravenous contrast. COMPARISON:  None Available. FINDINGS: Brain: There is a large acute infarct of the left PCA territory. No acute hemorrhage. Large amount of associated cytotoxic edema. Multiple old small vessel infarcts of the deep white matter and cerebellum. No chronic microhemorrhage or siderosis. Normal white matter signal, parenchymal volume and CSF spaces. The midline structures are normal. Vascular: Abnormal left ICA flow void Skull and upper cervical spine: Normal marrow signal. Sinuses/Orbits: Negative. Other: None. IMPRESSION: 1. Large acute infarct of the left PCA territory. No acute hemorrhage or mass effect. 2. Abnormal left ICA flow void, consistent with occlusion or severe stenosis. 3. Multiple old small vessel infarcts of the deep white matter and cerebellum. Electronically Signed   By: Ulyses Jarred M.D.   On: 08/18/2022 23:28   US Carotid Bilateral  Result Date: 08/18/2022 CLINICAL DATA:  Cerebrovascular accident. EXAM: BILATERAL CAROTID DUPLEX ULTRASOUND TECHNIQUE: Pearline Cables scale imaging, color Doppler and duplex ultrasound were performed of bilateral carotid and vertebral arteries in the neck. COMPARISON:  Bilateral carotid ultrasound 12/19/2021 FINDINGS: Criteria: Quantification of carotid stenosis is based on velocity parameters  that correlate the residual internal carotid diameter with NASCET-based stenosis levels, using the diameter of the distal internal carotid lumen as the denominator for stenosis measurement. The following velocity measurements were obtained: RIGHT ICA: 127/52 cm/sec CCA: [id number] cm/sec SYSTOLIC ICA/CCA RATIO:  2.3 ECA: 101 cm/sec LEFT ICA: 25/1 cm/sec CCA: 0000000 cm/sec SYSTOLIC ICA/CCA RATIO:  0.7 ECA: 105 cm/sec RIGHT CAROTID ARTERY: Heterogeneous atherosclerotic plaque within the distal common carotid artery and proximal internal carotid artery. RIGHT VERTEBRAL ARTERY:  Antegrade flow. LEFT CAROTID ARTERY: Moderate plaque within left carotid bulb and high-grade plaque within the proximal left internal carotid artery. The left ICA high-grade plaque with intermittent flow in between shadowing plaques. It is difficult to exclude a focal region of mid left ICA occlusion on the provided images. LEFT VERTEBRAL ARTERY:  Antegrade flow. IMPRESSION: 1. High-grade stenosis of the proximal left internal carotid artery with flow documented but with intervening shadowing plaques precluding evaluation of some regions. Given the shadowing plaque it is difficult to exclude a focal region of occlusion in the mid left internal carotid artery on the provided images. 2. Right internal carotid artery peak systolic velocity of [id number] cm per second corresponds to the lower end of 50-69% stenosis. 3. Antegrade flow in the bilateral vertebral arteries. Electronically Signed   By: Yvonne Kendall M.D.   On: 08/18/2022 19:39   CT HEAD WO CONTRAST  Result Date: 08/18/2022 CLINICAL DATA:  Expressive aphasia.  Acute neuro deficit. EXAM: CT HEAD WITHOUT CONTRAST TECHNIQUE: Contiguous axial images were obtained from the base of the skull through the vertex without intravenous contrast. RADIATION DOSE REDUCTION: This exam was performed according to the departmental dose-optimization program which includes automated exposure control, adjustment of the  mA and/or kV according to patient size and/or use of iterative reconstruction technique. COMPARISON:  None Available. FINDINGS: Brain: Large field of parenchymal hypodensity involving the white matter and cerebral cortex of the LEFT parietal lobe posteriorly and superiorly as well as the LEFT occipital lobe. Extension into the LEFT mesotemporal lobe additionally. Region of parietooccipital involvement measures approximately 7.0 x 4.5 4.6 cm (75 cc). No intracranial hemorrhage identified. No hyperdense vessel identified. No midline shift or mass  effect.  No ventriculomegaly. Mild cortical atrophy noted. Minimal white matter microvascular disease. Vascular: No hyperdense vessel or unexpected calcification. Skull: Normal. Negative for fracture or focal lesion. Sinuses/Orbits: No acute finding. Other: None. IMPRESSION: 1. Large region of parenchymal hypodensity in the LEFT parietooccipital lobe consistent with acute or subacute infarction. 2. No intracranial hemorrhage. 3. No midline shift or mass effect. Findings conveyed toJessup MD on 08/18/2022  at16:05. Electronically Signed   By: Suzy Bouchard M.D.   On: 08/18/2022 16:07    Microbiology: No results found for this or any previous visit (from the past 240 hour(s)).   Labs: CBC: Recent Labs  Lab 08/18/22 1541 08/20/22 0752 08/21/22 0622 08/22/22 0409 08/23/22 0303 08/24/22 0539  WBC 6.9 8.3 7.6 6.2 6.1 6.5  NEUTROABS 5.3  --   --   --   --   --   HGB 9.3* 9.3* 9.1* 8.8* 8.6* 9.0*  HCT 32.9* 31.9* 31.1* 29.9* 28.9* 30.0*  MCV 84.6 82.0 82.7 82.4 81.9 81.1  PLT 301 272 272 300 294 [id number]   Basic Metabolic Panel: Recent Labs  Lab 08/19/22 1908 08/20/22 0752 08/21/22 0622 08/22/22 0409 08/23/22 0303 08/24/22 0539  NA 144 139 141 139 138 138  K 3.9 4.2 4.2 3.6 3.4* 3.7  CL 112* 113* 107 109 108 109  CO2 21* 19* 22 22 22 22   GLUCOSE 105* 182* 173* 162* 157* 156*  BUN 33* 34* 34* 32* 33* 32*  CREATININE 1.84* 1.73* 1.70* 1.66* 1.58* 1.46*   CALCIUM 8.7* 8.8* 9.0 8.4* 8.3* 8.4*  MG 2.3 2.5* 2.3 2.3  --  2.5*  PHOS  --  3.9 2.8 2.4*  --  2.3*   Liver Function Tests: Recent Labs  Lab 08/18/22 1541  AST 18  ALT 11  ALKPHOS 126  BILITOT 0.8  PROT 6.7  ALBUMIN 3.9   No results for input(s): "LIPASE", "AMYLASE" in the last 168 hours. No results for input(s): "AMMONIA" in the last 168 hours. Cardiac Enzymes: No results for input(s): "CKTOTAL", "CKMB", "CKMBINDEX", "TROPONINI" in the last 168 hours. BNP (last 3 results) Recent Labs    12/17/21 1623  BNP 273.1*   CBG: Recent Labs  Lab 08/23/22 0735 08/23/22 1149 08/23/22 1623 08/23/22 2036 08/24/22 0747  GLUCAP 166* 173* 174* 244* 317*    Time spent: 35 minutes  Signed:  Val Riles  [facility]  08/24/2022 11:45 AM

## 2022-08-28 ENCOUNTER — Emergency Department
Admission: EM | Admit: 2022-08-28 | Discharge: 2022-08-28 | Disposition: A | Discharge status: Other Acute Inpatient Hospital | Payer: Medicare Other | Attending: Emergency Medicine | Admitting: Emergency Medicine

## 2022-08-28 ENCOUNTER — Other Ambulatory Visit: Payer: Self-pay

## 2022-08-28 ENCOUNTER — Emergency Department: Payer: Medicare Other

## 2022-08-28 ENCOUNTER — Encounter (HOSPITAL_COMMUNITY): Payer: Self-pay

## 2022-08-28 ENCOUNTER — Inpatient Hospital Stay: Admit: 2022-08-28 | Payer: Medicare Other | Admitting: Student in an Organized Health Care Education/Training Program

## 2022-08-28 DIAGNOSIS — I2510 Atherosclerotic heart disease of native coronary artery without angina pectoris: Secondary | ICD-10-CM | POA: Insufficient documentation

## 2022-08-28 DIAGNOSIS — I509 Heart failure, unspecified: Secondary | ICD-10-CM | POA: Diagnosis not present

## 2022-08-28 DIAGNOSIS — I110 Hypertensive heart disease with heart failure: Secondary | ICD-10-CM | POA: Insufficient documentation

## 2022-08-28 DIAGNOSIS — I629 Nontraumatic intracranial hemorrhage, unspecified: Secondary | ICD-10-CM | POA: Diagnosis not present

## 2022-08-28 DIAGNOSIS — I619 Nontraumatic intracerebral hemorrhage, unspecified: Secondary | ICD-10-CM

## 2022-08-28 DIAGNOSIS — E119 Type 2 diabetes mellitus without complications: Secondary | ICD-10-CM | POA: Insufficient documentation

## 2022-08-28 DIAGNOSIS — R531 Weakness: Secondary | ICD-10-CM | POA: Diagnosis present

## 2022-08-28 LAB — CBC
HCT: 31.3 % — ABNORMAL LOW (ref 36.0–46.0)
Hemoglobin: 9.1 g/dL — ABNORMAL LOW (ref 12.0–15.0)
MCH: 24.5 pg — ABNORMAL LOW (ref 26.0–34.0)
MCHC: 29.1 g/dL — ABNORMAL LOW (ref 30.0–36.0)
MCV: 84.1 fL (ref 80.0–100.0)
Platelets: 452 10*3/uL — ABNORMAL HIGH (ref 150–400)
RBC: 3.72 MIL/uL — ABNORMAL LOW (ref 3.87–5.11)
RDW: 19.1 % — ABNORMAL HIGH (ref 11.5–15.5)
WBC: 9.5 10*3/uL (ref 4.0–10.5)
nRBC: 0 % (ref 0.0–0.2)

## 2022-08-28 LAB — COMPREHENSIVE METABOLIC PANEL
ALT: 13 U/L (ref 0–44)
AST: 18 U/L (ref 15–41)
Albumin: 3.1 g/dL — ABNORMAL LOW (ref 3.5–5.0)
Alkaline Phosphatase: 126 U/L (ref 38–126)
Anion gap: 9 (ref 5–15)
BUN: 32 mg/dL — ABNORMAL HIGH (ref 8–23)
CO2: 21 mmol/L — ABNORMAL LOW (ref 22–32)
Calcium: 8.5 mg/dL — ABNORMAL LOW (ref 8.9–10.3)
Chloride: 105 mmol/L (ref 98–111)
Creatinine, Ser: 1.54 mg/dL — ABNORMAL HIGH (ref 0.44–1.00)
GFR, Estimated: 35 mL/min — ABNORMAL LOW (ref >60)
Glucose, Bld: 232 mg/dL — ABNORMAL HIGH (ref 70–99)
Potassium: 3.5 mmol/L (ref 3.5–5.1)
Sodium: 135 mmol/L (ref 135–145)
Total Bilirubin: 0.6 mg/dL (ref 0.3–1.2)
Total Protein: 6.6 g/dL (ref 6.5–8.1)

## 2022-08-28 LAB — DIFFERENTIAL
Abs Immature Granulocytes: 0.05 10*3/uL (ref 0.00–0.07)
Basophils Absolute: 0 10*3/uL (ref 0.0–0.1)
Basophils Relative: 0 %
Eosinophils Absolute: 0.1 10*3/uL (ref 0.0–0.5)
Eosinophils Relative: 1 %
Immature Granulocytes: 1 %
Lymphocytes Relative: 17 %
Lymphs Abs: 1.6 10*3/uL (ref 0.7–4.0)
Monocytes Absolute: 1 10*3/uL (ref 0.1–1.0)
Monocytes Relative: 10 %
Neutro Abs: 6.8 10*3/uL (ref 1.7–7.7)
Neutrophils Relative %: 71 %

## 2022-08-28 LAB — PROTIME-INR
INR: 1.1 (ref 0.8–1.2)
Prothrombin Time: 14.4 seconds (ref 11.4–15.2)

## 2022-08-28 LAB — APTT: aPTT: 29 seconds (ref 24–36)

## 2022-08-28 LAB — CBG MONITORING, ED: Glucose-Capillary: 221 mg/dL — ABNORMAL HIGH (ref 70–99)

## 2022-08-28 LAB — ETHANOL: Alcohol, Ethyl (B): <10 mg/dL (ref <10)

## 2022-08-28 MED ORDER — CLEVIDIPINE BUTYRATE 0.5 MG/ML IV EMUL
0.0000 mg/h | INTRAVENOUS | Status: DC
  Administered 2022-08-28: 2 mg/h via INTRAVENOUS
  Filled 2022-08-28: qty 50

## 2022-08-28 NOTE — ED Notes (Signed)
Called [facility] on availability to transfer pt to [facility], no trucks tonight.

## 2022-08-28 NOTE — ED Notes (Signed)
Pt in CT.

## 2022-08-28 NOTE — Code Documentation (Signed)
CODE STROKE- PHARMACY COMMUNICATION   Time CODE STROKE called/page received:1616  Time response to CODE STROKE was made (in person or via phone): 1630, pt had ETA via EMS of 20 min when code called.  Time Stroke Kit retrieved from [place] (only if needed):N/A  Name of Provider/Nurse contacted:Dr. Paduchowski/Dr. Cheral Marker  Past Medical History:  Diagnosis Date   Acute CVA (cerebrovascular accident) ([place]) 12/18/2021   Acute gastric ulcer without hemorrhage or perforation    Acute on chronic systolic CHF (congestive heart failure), NYHA class 3 ([place]) 08/03/2019   Formatting of this note might be different from the original. Global ef 35%   Anemia    Anemia, iron deficiency 11/07/2013   Arthritis    CAD (coronary artery disease)    Cerebrovascular accident [facility])    CHF (congestive heart failure) ([place])    Chicken pox    Chronic kidney disease    Colitis    ? 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 Authorizing Provider  allopurinol (ZYLOPRIM) 100 MG tablet Take 100 mg by mouth daily.    [provider]  aspirin EC 81 MG tablet Take 1 tablet (81 mg total) by mouth daily. 05/21/22   Fritzi Mandes, MD  atorvastatin (LIPITOR) 80 MG tablet Take 1 tablet (80 mg total) by mouth at bedtime.  08/24/22   Val Riles, MD  blood glucose meter kit and supplies KIT Dispense based on patient and insurance preference. Use up to four times daily as directed. 04/09/22   Ezekiel Slocumb, DO  calcitRIOL (ROCALTROL) 0.25 MCG capsule Take 0.25 mcg by mouth daily.    [provider]  calcium carbonate (TUMS - DOSED IN MG ELEMENTAL CALCIUM) 500 MG chewable tablet Chew 1 tablet (200 mg of elemental calcium total) by mouth 2 (two) times daily with a meal. 08/24/22 09/23/22  Val Riles, MD  clopidogrel (PLAVIX) 75 MG tablet Take 1 tablet (75 mg total) by mouth daily. 08/25/22   Val Riles, MD  cyanocobalamin 1000 MCG tablet Take 1 tablet (1,000 mcg total) by mouth daily. 08/27/22 11/25/22  Val Riles, MD  diphenhydrAMINE (BENADRYL) 50 MG capsule Take 50 mg by mouth at bedtime as needed for sleep.    [provider]  furosemide (LASIX) 40 MG tablet Take 1 tablet (40 mg total) by mouth daily as needed for fluid. 08/24/22   Val Riles, MD  levothyroxine (SYNTHROID) 200 MCG tablet Take 200 mcg by mouth every Monday, Wednesday, and Friday.    [provider]  losartan (COZAAR) 50 MG tablet Take 1 tablet (50 mg total) by mouth daily. 08/24/22   Val Riles, MD  Multiple Vitamin (MULTIVITAMIN WITH MINERALS) TABS tablet Take 1 tablet by mouth daily.  [provider]  Nutritional Supplements (,FEEDING SUPPLEMENT, PROSOURCE PLUS) liquid Take 30 mLs by mouth 3 (three) times daily between meals. 08/24/22   Val Riles, MD  pantoprazole (PROTONIX) 20 MG tablet Take 20 mg by mouth daily.    [provider]  senna-docusate (SENOKOT-S) 8.6-50 MG tablet Take 1 tablet by mouth at bedtime as needed for moderate constipation or mild constipation. 08/24/22   Val Riles, MD    Alison Murray ,PharmD Clinical Pharmacist  08/28/2022  4:41 PM

## 2022-08-28 NOTE — ED Notes (Addendum)
Pt. To ED for increased right sided weakness. Pt. Was seen in ED for stroke 08/18/22. Pt. States her weakness got worse yesterday. Pt. Is pleasantly confused.

## 2022-08-28 NOTE — ED Notes (Signed)
Called [facility] for possible transfer per Dr. Mamie Nick due to [facility] not having Neuro ICU beds att. FaceSheet faxed, images PowerShared

## 2022-08-28 NOTE — ED Triage Notes (Signed)
[facility] nursing called EMS reporting patient was more weak in her right arm than normal; patient reports this weakness got worse yesterday. Recently seen and admitted for CVA on 08/18/22.

## 2022-08-28 NOTE — ED Notes (Signed)
Pt is unable to sign consent to transfer, G[facility] 14, no family present

## 2022-08-28 NOTE — ED Notes (Signed)
Called [facility] for update, Per Lauren, pt is accepted, waiting on a bed assignmet

## 2022-08-28 NOTE — ED Notes (Signed)
This RN entered room 11 after being called by charge RN that pt. Had been brought to room by medic. Dr. Kerman Passey at bedside, [facility] RN triaging pt. Nira Conn, Agricultural consultant at bedside.

## 2022-08-28 NOTE — Consult Note (Signed)
NEURO HOSPITALIST CONSULT NOTE   Requestig physician: Dr. Kerman Passey  Reason for Consult: Acute onset of worsened right sided weakness relative to baseline  History obtained from: EMS,  Patient and Chart     HPI:                                                                                                                                          [name] is an 72 y.o. female with a PMHx significant for prior CVAs, hypertension, hyperlipidemia, hypothyroidism, gastric bypass, HFrEF with HCM, CKD stage IV, type 2 diabetes, CAD, PAD, who was recently discharged from the hospital on 3/18 after being admitted for stroke evaluation after presenting at that time with aphasia. She was diagnosed at that time with an acute left PCA territory stroke.   Summary of stroke work up from recent admission: CT evidence of a large left parietal occipital infarct  MRI brain:  Large acute infarct of the left PCA territory. No acute hemorrhage or mass effect. Abnormal left ICA flow void, consistent with occlusion or severe stenosis. Multiple old small vessel infarcts of the deep white matter and cerebellum. Vascular imaging revealed 70% left carotid stenosis but no procedure was done at that time due to concern for reperfusion hemorrhage.  TTE LVEF 35 to 40%, global hypokinesis with severe hypokinesis of inferior/posterior, inferior lateral and apical wall.  Moderate asymmetric LV hypertrophy, severe hypertrophy of basal-septal segments.  Grade 1 diastolic dysfunction. LA mild to moderate dilated, moderate MR, negative PFO.  Plan on discharge was as follows:  Continue Aspirin 81 mg pod, started Plavix 75 mg po daily and Lipitor 80 mg p.o. daily. Patient was to remain on DAPT for at least 90 days and single antiplatelet for life.  Neurology recommended ambulatory cardiac monitoring given LA dilation on TTE.  Cardiology was contacted for Zio patch/cardiac monitor to rule out occult  arrhythmias.  She re-presents to the ED today with increased weakness of her right arm, which had started to get worse yesterday. The patient states that she started to experience increased right sided weakness yesterday after lunch but before dinner; she cannot recall the exact time. Code Stroke was called in the ED.   Past Medical History:  Diagnosis Date   Acute CVA (cerebrovascular accident) ([place]) 12/18/2021   Acute gastric ulcer without hemorrhage or perforation    Acute on chronic systolic CHF (congestive heart failure), NYHA class 3 ([place]) 08/03/2019   Formatting of this note might be different from the original. Global ef 35%   Anemia    Anemia, iron deficiency 11/07/2013   Arthritis    CAD (coronary artery disease)    Cerebrovascular accident [facility])    CHF (congestive heart failure) ([place])    Chicken pox  Chronic kidney disease    Colitis    ? 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   Sleep apnea    Type 2 diabetes mellitus with diabetic peripheral angiopathy without gangrene, without long-term current use of insulin [facility])     Past Surgical History:  Procedure Laterality Date   ABDOMINAL HYSTERECTOMY     CAROTID ANGIOGRAPHY Left 07/21/2022   Procedure: CAROTID ANGIOGRAPHY;  Surgeon: Katha Cabal, MD;  Location: [place] [facility];  Service: Cardiovascular;  Laterality: Left;   CHOLECYSTECTOMY     COLONOSCOPY WITH PROPOFOL N/A 12/24/2014   Procedure: COLONOSCOPY WITH PROPOFOL;  Surgeon:  Hulen Luster, MD;  Location: [facility] ENDOSCOPY;  Service: Gastroenterology;  Laterality: N/A;   CORONARY ARTERY BYPASS GRAFT     ESOPHAGOGASTRODUODENOSCOPY N/A 09/02/2017   Procedure: ESOPHAGOGASTRODUODENOSCOPY (EGD);  Surgeon: Virgel Manifold, MD;  Location: [facility] ENDOSCOPY;  Service: Endoscopy;  Laterality: N/A;   ESOPHAGOGASTRODUODENOSCOPY (EGD) WITH PROPOFOL N/A 12/24/2014   Procedure: ESOPHAGOGASTRODUODENOSCOPY (EGD) WITH PROPOFOL;  Surgeon: Hulen Luster, MD;  Location: [facility] ENDOSCOPY;  Service: Gastroenterology;  Laterality: N/A;   JOINT REPLACEMENT     LOWER EXTREMITY ANGIOGRAPHY Left 07/17/2019   Procedure: Lower Extremity Angiography;  Surgeon: Algernon Huxley, MD;  Location: [place] [facility];  Service: Cardiovascular;  Laterality: Left;   ROUX-EN-Y GASTRIC BYPASS     loss over 100 pounds   TONSILLECTOMY     TOTAL KNEE ARTHROPLASTY Left     Family History  Problem Relation Age of Onset   Coronary artery disease Other    Stroke Maternal Grandmother    Diabetes Paternal Grandmother        diabetic coma   Heart attack Father    Heart attack Mother    Heart attack Maternal Grandfather    Heart attack Paternal Grandfather    Breast cancer Neg Hx             Social History:  reports that she has never smoked. She has never used smokeless tobacco. She reports that she does not drink alcohol and does not use drugs.  Allergies  Allergen Reactions   Niacin Other (See Comments)    Heart Attack Other reaction(s): Other (see comments) Heart Attack   Tramadol     Other reaction(s): Other (see comments) Intolerance, makes woozy   Ultram [Tramadol Hcl]     Intolerance, makes woozy   Baclofen Other (See Comments)    Dizziness Other reaction(s): Other (see comments) Dizziness   Cardura [Doxazosin Mesylate] Other (See Comments)    Dizzy with passing out spells   Doxazosin     Other reaction(s): Other (see comments) Dizzy with passing out spells Dizzy with passing out spells    Morphine And Related Nausea And Vomiting and Nausea Only   Other Rash    Gold metal (nickel in the gold) Gold metal Gold metal (nickel in the gold)    HOME MEDICATIONS:  No current facility-administered medications on file prior to encounter.   Current Outpatient Medications on File Prior to Encounter  Medication Sig Dispense Refill   allopurinol (ZYLOPRIM) 100 MG tablet Take 100 mg by mouth daily.     aspirin EC 81 MG tablet Take 1 tablet (81 mg total) by mouth daily. 90 tablet 0   atorvastatin (LIPITOR) 80 MG tablet Take 1 tablet (80 mg total) by mouth at bedtime.     blood glucose meter kit and supplies KIT Dispense based on patient and insurance preference. Use up to four times daily as directed. 1 each 0   calcitRIOL (ROCALTROL) 0.25 MCG capsule Take 0.25 mcg by mouth daily.     calcium carbonate (TUMS - DOSED IN MG ELEMENTAL CALCIUM) 500 MG chewable tablet Chew 1 tablet (200 mg of elemental calcium total) by mouth 2 (two) times daily with a meal. 60 tablet 0   clopidogrel (PLAVIX) 75 MG tablet Take 1 tablet (75 mg total) by mouth daily.     cyanocobalamin 1000 MCG tablet Take 1 tablet (1,000 mcg total) by mouth daily. 90 tablet 0   diphenhydrAMINE (BENADRYL) 50 MG capsule Take 50 mg by mouth at bedtime as needed for sleep.     furosemide (LASIX) 40 MG tablet Take 1 tablet (40 mg total) by mouth daily as needed for fluid. 30 tablet    levothyroxine (SYNTHROID) 200 MCG tablet Take 200 mcg by mouth every Monday, Wednesday, and Friday.     losartan (COZAAR) 50 MG tablet Take 1 tablet (50 mg total) by mouth daily.     Multiple Vitamin (MULTIVITAMIN WITH MINERALS) TABS tablet Take 1 tablet by mouth daily.     Nutritional Supplements (,FEEDING SUPPLEMENT, PROSOURCE PLUS) liquid Take 30 mLs by mouth 3 (three) times daily between meals.     pantoprazole (PROTONIX) 20 MG tablet  Take 20 mg by mouth daily.     senna-docusate (SENOKOT-S) 8.6-50 MG tablet Take 1 tablet by mouth at bedtime as needed for moderate constipation or mild constipation.       ROS:                                                                                                                                       As per HPI. The patient does not endorse any additional symptoms.    There were no vitals taken for this visit.   General Examination:  Physical Exam  HEENT-  [place]/AT  Lungs- Respirations unlabored Extremities- No edema  Neurological Examination Mental Status: Awake and alert. Partially oriented. Speech is fluent but with intermittent word salad as well as phonemic paraphasias and perseveration. Also with significant naming deficits. Able to follow all motor commands, some requiring repeated requests, except for tongue protrusion which she could not perform (opens and closes mouth repeatedly in response).  Cranial Nerves: II: Right visual field cut. PERRL.  III,IV, VI:No ptosis. EOMI but with saccadic visual pursuits.  V: Temp sensation equal bilaterally VII: Smile symmetric VIII: HOH IX,X: No hypophonia or hoarseness XI: Symmetric XII: Unable to follow command Motor: RUE 5/5 LUE 5/5 No pronator drift, but there is a "parietal drift" on the right Resists examiner with 5/5 strength BLE but there is bobbing drift of right LE when held antigravity Sensory: FT intact x 4 with no extinction to DSS.  Deep Tendon Reflexes: 2+ and symmetric throughout Cerebellar: No ataxia with FNF bilaterally  Gait: Deferred  NIHSS: 7   Lab Results: Basic Metabolic Panel: Recent Labs  Lab 08/22/22 0409 08/23/22 0303 08/24/22 0539  NA 139 138 138  K 3.6 3.4* 3.7  CL 109 108 109  CO2 22 22 22   GLUCOSE 162* 157* 156*  BUN 32* 33* 32*  CREATININE 1.66* 1.58* 1.46*  CALCIUM 8.4* 8.3*  8.4*  MG 2.3  --  2.5*  PHOS 2.4*  --  2.3*    CBC: Recent Labs  Lab 08/22/22 0409 08/23/22 0303 08/24/22 0539  WBC 6.2 6.1 6.5  HGB 8.8* 8.6* 9.0*  HCT 29.9* 28.9* 30.0*  MCV 82.4 81.9 81.1  PLT 300 294 311    Cardiac Enzymes: No results for input(s): "CKTOTAL", "CKMB", "CKMBINDEX", "TROPONINI" in the last 168 hours.  Lipid Panel: No results for input(s): "CHOL", "TRIG", "HDL", "CHOLHDL", "VLDL", "LDLCALC" in the last 168 hours.  Imaging: No results found.  Assessment: 72 year old female with a history of recent left posterior cerebral artery ischemic infarction who re-presents to the ED today with increased weakness of her right arm, which had started to get worse yesterday. Code Stroke was called in the ED. - LKN: The patient states that she started to experience increased right sided weakness yesterday after lunch but before dinner; she cannot recall the exact time.  - Exam reveals mild disorientation, intermittent word salad quality, paraphasias, perseveration and naming deficits in the context of otherwise fluent speech. Right visual field cut, right parietal drift and mild RLE weakness also noted.  - CT head: Findings concerning for hemorrhagic transformation in the area of recent left PCA territory infarct. This is associated with edema, 3 mm of left-to-right midline shift, and mass effect on the left lateral ventricle. No hydrocephalus or evidence of entrapment.   Recommendations: - Will need to be transferred to [facility] ICU for post-hemorrhage management.  - Clevidipine gtt with SBP goal of 130-150 - Repeat CT head in 12 hours.  - Will hold off on hypertonic saline for now as mass effect on CT is mild.  - MRI brain - PT consult, OT consult, Speech consult - Cardiac telemetry - Frequent neuro checks - Hold antiplatelet medications. - No anticoagulation. DVT prophylaxis with SCDs - Discussed with EDP Dr. Kerman Passey and covering Neurologist at [facility] Dr.  Rory Percy   Electronically signed: Dr. Kerney Elbe 08/28/2022, 4:46 PM

## 2022-08-28 NOTE — ED Provider Notes (Addendum)
[facility] Provider Note    Event Date/Time   First MD Initiated Contact with Patient 08/28/22 1623     (approximate)  History   Chief Complaint: Extremity Weakness  HPI  [name] is a 72 y.o. female with a past medical history of a recent CVA, CHF, CAD, diabetes, hypertension, presents to the emergency department for right arm weakness.  According to EMS report approximate 2 hours ago patient complained of right hand/arm weakness.  Code stroke was activated by EMS in the field and the patient was brought emergently to [facility] ED.  In speaking to the patient she states she began feeling some right arm weakness yesterday but it got worse several hours ago.  In reviewing the patient's history briefly it appears that she was admitted to the hospital approximately 2 weeks ago for CVA affecting her left PCA.  Patient denies any headache denies any fever, does state a sensation of generalized weakness/fatigue.  Physical Exam   Triage Vital Signs: ED Triage Vitals  Enc Vitals Group     BP      Pulse      Resp      Temp      Temp src      SpO2      Weight      Height      Head Circumference      Peak Flow      Pain Score      Pain Loc      Pain Edu?      Excl. in [place]?     Most recent vital signs: There were no vitals filed for this visit.  General: Awake, no distress.  CV:  Good peripheral perfusion.  Regular rate and rhythm  Resp:  Normal effort.  Equal breath sounds bilaterally.  Abd:  No distention.  Soft, nontender.  No rebound or guarding. Other:  Patient has good grip strength bilaterally with no obvious deficit in the right hand or arm.  No pronator drift.  No cranial nerve deficits.  Good strength in bilateral lower extremities.   ED Results / Procedures / Treatments   EKG  EKG viewed and interpreted by myself shows a sinus rhythm at 74 bpm with a slightly widened QRS, left axis deviation, largely normal intervals with nonspecific ST  changes.  RADIOLOGY  I have reviewed and interpreted CT head images.  Patient appears to have a large bleed in the area of her prior infarct consistent with hemorrhagic conversion. Radiology has read the CT scan as hemorrhagic transformation with a 3 mm midline shift.   MEDICATIONS ORDERED IN ED: Medications - No data to display   IMPRESSION / MDM / [place] / ED COURSE  I reviewed the triage vital signs and the nursing notes.  Patient's presentation is most consistent with acute presentation with potential threat to life or bodily function.  Patient presents emergency department for right hand/arm weakness.  In speaking to the patient she states that it actually started yesterday but worsened couple hours ago.  In reviewing the patient's history her discharge summary from 08/24/2022 shows that the patient was admitted for CVA with symptoms of right-sided speech difficulty with a left parietal occipital lobe CVA.  MRI confirmed large acute PCA territory infarct of the left side.  Patient did have 70% left carotid stenosis but no procedure was done at that time due to concern for reperfusion hemorrhage.  Given the patient's right hand/arm weakness starting  yesterday concern for possible new CVA, hemorrhagic conversion, or could be related to the patient's prior CVA.  Code stroke was called by EMS in the field, neurology currently at bedside evaluating the patient.  We will proceed with labs and a CT scan of the head to further evaluate.  CT unfortunately shows a hemorrhagic transformation/conversion of a prior CVA with a 3 mm midline shift.  I spoke to Dr. Cheral Marker of neurology who has evaluated the patient recommends transfer to neuro ICU at [facility].  I spoke to Dr. Rory Percy at [facility] who is excepting the patient to the neuro ICU.  Lab work shows anemia but normal platelet count, chemistry shows mild renal sufficiency mild hyperglycemia no significant  findings.   ----------------------------------------- 9:33 PM on 08/28/2022 ----------------------------------------- [facility] has informed us that they will not have a bed likely for the patient tonight in the neuro ICU.  I spoke to [facility] Dr. Ronalee Red, who has accepted to his service likely to a stepdown bed for now.  We will transfer as soon as a bed is assigned.  ----------------------------------------- 10:40 PM on 08/28/2022 -----------------------------------------  Continues to awaken easily to voice answer questions appropriately and follow commands.  EMS is here for patient transport.  CRITICAL CARE Performed by: Harvest Dark   Total critical care time: 30 minutes  Critical care time was exclusive of separately billable procedures and treating other patients.  Critical care was necessary to treat or prevent imminent or life-threatening deterioration.  Critical care was time spent personally by me on the following activities: development of treatment plan with patient and/or surrogate as well as nursing, discussions with consultants, evaluation of patient's response to treatment, examination of patient, obtaining history from patient or surrogate, ordering and performing treatments and interventions, ordering and review of laboratory studies, ordering and review of radiographic studies, pulse oximetry and re-evaluation of patient's condition.   FINAL CLINICAL IMPRESSION(S) / ED DIAGNOSES   Intracranial hemorrhage/hemorrhagic transformation  Note:  This document was prepared using Dragon voice recognition software and may include unintentional dictation errors.   Harvest Dark, MD 08/28/22 1815    Harvest Dark, MD 08/28/22 2133    Harvest Dark, MD 08/28/22 2241

## 2022-08-28 NOTE — Plan of Care (Signed)
Notified by Dr. Cheral Marker and Dr. Kerman Passey of this patient.  Recent PCA infarction with large hemorrhagic transformation.  Will need ICU for blood pressure management and management of the hemorrhagic transformation.  Excepting the patient to the neurology service in the neuro ICU per Dr. Yvetta Coder recommendation. On-call neurohospitalist will evaluate the patient, and place admission orders on arrival to [facility] If a bed is not available for more than 4 hours, I would suggest reaching out to other facilities for admission.  -- Amie Portland, MD Neurologist [facility] Pager: [phone]

## 2022-08-28 NOTE — ED Notes (Signed)
Code stroke cancelled per Dr. Lindzen 

## 2022-08-28 NOTE — Progress Notes (Signed)
Code stroke cart activated at [place] at bedside.  Stroke alert canceled by Dr. Cheral Marker at 1638-states pt outside window for TNK,  [place] yesterday, and  Not showing any signs of Large vessel occlusion.  Linden Dolin, Tele Stroke RN

## 2022-12-04 ENCOUNTER — Other Ambulatory Visit: Payer: Self-pay

## 2022-12-04 ENCOUNTER — Emergency Department: Payer: Medicare Other

## 2022-12-04 ENCOUNTER — Encounter (HOSPITAL_COMMUNITY): Payer: Self-pay

## 2022-12-04 ENCOUNTER — Emergency Department
Admission: EM | Admit: 2022-12-04 | Discharge: 2022-12-04 | Disposition: A | Discharge status: Other Acute Inpatient Hospital | Payer: Medicare Other | Attending: Emergency Medicine | Admitting: Emergency Medicine

## 2022-12-04 ENCOUNTER — Inpatient Hospital Stay (HOSPITAL_COMMUNITY)
Admission: AD | Admit: 2022-12-04 | Discharge: 2022-12-10 | DRG: 064 | Disposition: A | Discharge status: Skilled Nursing Facility | Payer: Medicare Other | Source: Other Acute Inpatient Hospital | Attending: Neurology | Admitting: Neurology

## 2022-12-04 DIAGNOSIS — E1165 Type 2 diabetes mellitus with hyperglycemia: Secondary | ICD-10-CM | POA: Diagnosis present

## 2022-12-04 DIAGNOSIS — I63411 Cerebral infarction due to embolism of right middle cerebral artery: Principal | ICD-10-CM | POA: Diagnosis present

## 2022-12-04 DIAGNOSIS — Z79890 Hormone replacement therapy (postmenopausal): Secondary | ICD-10-CM | POA: Diagnosis not present

## 2022-12-04 DIAGNOSIS — K909 Intestinal malabsorption, unspecified: Secondary | ICD-10-CM | POA: Diagnosis present

## 2022-12-04 DIAGNOSIS — Z66 Do not resuscitate: Secondary | ICD-10-CM | POA: Diagnosis present

## 2022-12-04 DIAGNOSIS — R4182 Altered mental status, unspecified: Secondary | ICD-10-CM | POA: Diagnosis present

## 2022-12-04 DIAGNOSIS — D509 Iron deficiency anemia, unspecified: Secondary | ICD-10-CM | POA: Diagnosis present

## 2022-12-04 DIAGNOSIS — Z951 Presence of aortocoronary bypass graft: Secondary | ICD-10-CM

## 2022-12-04 DIAGNOSIS — Z9884 Bariatric surgery status: Secondary | ICD-10-CM | POA: Diagnosis not present

## 2022-12-04 DIAGNOSIS — I5022 Chronic systolic (congestive) heart failure: Secondary | ICD-10-CM | POA: Diagnosis present

## 2022-12-04 DIAGNOSIS — Z8673 Personal history of transient ischemic attack (TIA), and cerebral infarction without residual deficits: Secondary | ICD-10-CM | POA: Diagnosis not present

## 2022-12-04 DIAGNOSIS — G936 Cerebral edema: Secondary | ICD-10-CM | POA: Diagnosis present

## 2022-12-04 DIAGNOSIS — I110 Hypertensive heart disease with heart failure: Secondary | ICD-10-CM | POA: Diagnosis present

## 2022-12-04 DIAGNOSIS — Z96652 Presence of left artificial knee joint: Secondary | ICD-10-CM | POA: Diagnosis present

## 2022-12-04 DIAGNOSIS — G8194 Hemiplegia, unspecified affecting left nondominant side: Secondary | ICD-10-CM | POA: Diagnosis present

## 2022-12-04 DIAGNOSIS — Z8249 Family history of ischemic heart disease and other diseases of the circulatory system: Secondary | ICD-10-CM

## 2022-12-04 DIAGNOSIS — R1310 Dysphagia, unspecified: Secondary | ICD-10-CM | POA: Diagnosis present

## 2022-12-04 DIAGNOSIS — Z7982 Long term (current) use of aspirin: Secondary | ICD-10-CM

## 2022-12-04 DIAGNOSIS — I6300 Cerebral infarction due to thrombosis of unspecified precerebral artery: Secondary | ICD-10-CM | POA: Diagnosis not present

## 2022-12-04 DIAGNOSIS — I629 Nontraumatic intracranial hemorrhage, unspecified: Secondary | ICD-10-CM | POA: Insufficient documentation

## 2022-12-04 DIAGNOSIS — E039 Hypothyroidism, unspecified: Secondary | ICD-10-CM | POA: Diagnosis present

## 2022-12-04 DIAGNOSIS — E785 Hyperlipidemia, unspecified: Secondary | ICD-10-CM | POA: Diagnosis present

## 2022-12-04 DIAGNOSIS — I2510 Atherosclerotic heart disease of native coronary artery without angina pectoris: Secondary | ICD-10-CM | POA: Diagnosis not present

## 2022-12-04 DIAGNOSIS — I619 Nontraumatic intracerebral hemorrhage, unspecified: Secondary | ICD-10-CM

## 2022-12-04 DIAGNOSIS — E1151 Type 2 diabetes mellitus with diabetic peripheral angiopathy without gangrene: Secondary | ICD-10-CM | POA: Diagnosis present

## 2022-12-04 DIAGNOSIS — I639 Cerebral infarction, unspecified: Secondary | ICD-10-CM | POA: Diagnosis present

## 2022-12-04 DIAGNOSIS — I63511 Cerebral infarction due to unspecified occlusion or stenosis of right middle cerebral artery: Principal | ICD-10-CM | POA: Diagnosis present

## 2022-12-04 DIAGNOSIS — M1990 Unspecified osteoarthritis, unspecified site: Secondary | ICD-10-CM | POA: Diagnosis present

## 2022-12-04 DIAGNOSIS — I351 Nonrheumatic aortic (valve) insufficiency: Secondary | ICD-10-CM | POA: Diagnosis not present

## 2022-12-04 DIAGNOSIS — I252 Old myocardial infarction: Secondary | ICD-10-CM

## 2022-12-04 DIAGNOSIS — Z888 Allergy status to other drugs, medicaments and biological substances status: Secondary | ICD-10-CM

## 2022-12-04 DIAGNOSIS — Z9049 Acquired absence of other specified parts of digestive tract: Secondary | ICD-10-CM

## 2022-12-04 DIAGNOSIS — R29818 Other symptoms and signs involving the nervous system: Secondary | ICD-10-CM | POA: Diagnosis present

## 2022-12-04 DIAGNOSIS — Z823 Family history of stroke: Secondary | ICD-10-CM

## 2022-12-04 DIAGNOSIS — I6522 Occlusion and stenosis of left carotid artery: Secondary | ICD-10-CM | POA: Diagnosis present

## 2022-12-04 DIAGNOSIS — E063 Autoimmune thyroiditis: Secondary | ICD-10-CM | POA: Diagnosis present

## 2022-12-04 DIAGNOSIS — Z885 Allergy status to narcotic agent status: Secondary | ICD-10-CM

## 2022-12-04 DIAGNOSIS — Z90710 Acquired absence of both cervix and uterus: Secondary | ICD-10-CM

## 2022-12-04 DIAGNOSIS — Z751 Person awaiting admission to adequate facility elsewhere: Secondary | ICD-10-CM

## 2022-12-04 DIAGNOSIS — Z833 Family history of diabetes mellitus: Secondary | ICD-10-CM

## 2022-12-04 DIAGNOSIS — R233 Spontaneous ecchymoses: Secondary | ICD-10-CM | POA: Diagnosis present

## 2022-12-04 DIAGNOSIS — Z7902 Long term (current) use of antithrombotics/antiplatelets: Secondary | ICD-10-CM

## 2022-12-04 DIAGNOSIS — Z881 Allergy status to other antibiotic agents status: Secondary | ICD-10-CM

## 2022-12-04 DIAGNOSIS — I63311 Cerebral infarction due to thrombosis of right middle cerebral artery: Secondary | ICD-10-CM | POA: Diagnosis not present

## 2022-12-04 DIAGNOSIS — Z79899 Other long term (current) drug therapy: Secondary | ICD-10-CM

## 2022-12-04 LAB — CBC
HCT: 40.6 % (ref 36.0–46.0)
Hemoglobin: 12.1 g/dL (ref 12.0–15.0)
MCH: 26.8 pg (ref 26.0–34.0)
MCHC: 29.8 g/dL — ABNORMAL LOW (ref 30.0–36.0)
MCV: 90 fL (ref 80.0–100.0)
Platelets: 271 10*3/uL (ref 150–400)
RBC: 4.51 MIL/uL (ref 3.87–5.11)
RDW: 17.3 % — ABNORMAL HIGH (ref 11.5–15.5)
WBC: 6.4 10*3/uL (ref 4.0–10.5)
nRBC: 0 % (ref 0.0–0.2)

## 2022-12-04 LAB — BASIC METABOLIC PANEL
Anion gap: 12 (ref 5–15)
BUN: 29 mg/dL — ABNORMAL HIGH (ref 8–23)
CO2: 18 mmol/L — ABNORMAL LOW (ref 22–32)
Calcium: 8.9 mg/dL (ref 8.9–10.3)
Chloride: 111 mmol/L (ref 98–111)
Creatinine, Ser: 1.6 mg/dL — ABNORMAL HIGH (ref 0.44–1.00)
GFR, Estimated: 34 mL/min — ABNORMAL LOW (ref >60)
Glucose, Bld: 130 mg/dL — ABNORMAL HIGH (ref 70–99)
Potassium: 4.8 mmol/L (ref 3.5–5.1)
Sodium: 141 mmol/L (ref 135–145)

## 2022-12-04 LAB — CK: Total CK: 260 U/L — ABNORMAL HIGH (ref 38–234)

## 2022-12-04 MED ORDER — ACETAMINOPHEN 325 MG PO TABS: 650.0000 mg | ORAL_TABLET | ORAL | Status: DC | PRN

## 2022-12-04 MED ORDER — ACETAMINOPHEN 650 MG RE SUPP: 650.0000 mg | RECTAL | Status: DC | PRN

## 2022-12-04 MED ORDER — SENNOSIDES-DOCUSATE SODIUM 8.6-50 MG PO TABS: 1.0000 | ORAL_TABLET | Freq: Every evening | ORAL | Status: DC | PRN

## 2022-12-04 MED ORDER — CLEVIDIPINE BUTYRATE 0.5 MG/ML IV EMUL
0.0000 mg/h | INTRAVENOUS | Status: DC
  Administered 2022-12-04: 4 mg/h via INTRAVENOUS
  Administered 2022-12-04: 2 mg/h via INTRAVENOUS
  Filled 2022-12-04 (×2): qty 50

## 2022-12-04 MED ORDER — SODIUM CHLORIDE 0.9 % IV SOLN: INTRAVENOUS | Status: AC

## 2022-12-04 MED ORDER — ACETAMINOPHEN 160 MG/5ML PO SOLN: 650.0000 mg | ORAL | Status: DC | PRN

## 2022-12-04 MED ORDER — STROKE: EARLY STAGES OF RECOVERY BOOK
Freq: Once | Status: AC
  Filled 2022-12-04: qty 1

## 2022-12-04 NOTE — ED Notes (Signed)
Pt arrives to room from CT

## 2022-12-04 NOTE — ED Triage Notes (Signed)
Pt comes via [facility] from home with c/o fall. Caregiver came to check on pt. Pt was on floor and AMS present. Pt does have some slurring from prior stroke. Pt confusion is not normal. Pt has strong odor or urine and feces. Pt possibly down for about 10-15 hrs now. Pt denies any pain or obvious injuries.   170/86 64 CBG-154 96% RA

## 2022-12-04 NOTE — ED Notes (Signed)
Pt has a bed assignment @ [facility] 4N Room 2 [phone]

## 2022-12-04 NOTE — ED Notes (Signed)
Unable to obtain EMTALA signature for patient and no family at bedside.

## 2022-12-04 NOTE — Plan of Care (Signed)
On call note Called by Dr. Derrill Kay. LKW anywhere from 10-15h ago to a day ago. Confused. CTH with large right ischemic stroke with hemorrhagic conversion. Prior Left hemoispherinc stroke with large hemorrhagic conversion in March seen [facility] ER and transferred to [facility] at that time. Question CAA Admit to [facility]. Neurohospitalist service. Drs. Amada [place] and [place] made aware and on-call will see on arrival to [facility]. SBP goal 130-150 - use Labetalol/hydralazine PRN or cleviprex if needed. No antiplatelet/anticoagulants.  -- Milon Dikes, MD Neurologist [facility] Pager: [phone]

## 2022-12-04 NOTE — H&P (Incomplete)
Admission H&P    Chief Complaint: Found down and unable to get up with altered mental status  HPI: [name] is an 72 y.o. 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 Her caregiver had come to check on the patient after attempting to contact her by phone with no answer. She was found on the floor with AMS present; a strong odor of urine and feces was noted. She was unable to get up off the ground with caretaker assistance and EMS was called. It was estimated that the patient had possibly been down for about 10-15 hrs. Vitals on arrival: BP 170/86, HR 64, CBG 154, 96% RA.   CT head was obtained, revealing a subacute infarct in the right temporal lobe, extending toward the right temporal occipital region, with areas of hemorrhage, most likely hemorrhagic transformation. This was associated with local mass effect and 3 mm of right to left midline shift. Also noted was a remote infarct in the left parietal, occipital, and temporal lobes, with resolution of previously noted hyperdense hemorrhage.  CT of the cervical spine revealed no acute fracture or traumatic listhesis.  Past Medical History:  Diagnosis Date   Acute CVA (cerebrovascular accident) (HCC) 12/18/2021   Acute gastric ulcer without hemorrhage or perforation    Acute on chronic systolic CHF (congestive heart failure), NYHA class 3 (HCC) 08/03/2019   Formatting of this note might be different from the original. Global ef 35%   Anemia    Anemia, iron deficiency 11/07/2013   Arthritis    CAD (coronary artery disease)    Cerebrovascular accident  [facility])    CHF (congestive heart failure) (HCC)    Chicken pox    Chronic kidney disease    Colitis    ? at 14   Coronary artery disease with hx of myocardial infarct w/o hx of CABG 05/21/2015   Diabetes mellitus    DOE (dyspnea on exertion) 09/01/2017   H/O: stroke 07/15/2019   Hashimoto thyroiditis    w subsequent hypothyroidism   Headache    Heme + stool 11/07/2013   History of anemia due to chronic kidney disease 12/18/2021   HTN (hypertension)    Hyperlipidemia    Hypothyroidism    Myocardial infarction [facility])    PAD (peripheral artery disease) (HCC)    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   Sleep apnea    Type 2 diabetes mellitus with diabetic peripheral angiopathy without gangrene, without long-term current use of insulin [facility])     Past Surgical History:  Procedure Laterality Date   ABDOMINAL HYSTERECTOMY     CAROTID ANGIOGRAPHY Left 07/21/2022   Procedure: CAROTID ANGIOGRAPHY;  Surgeon: Renford Dills, MD;  Location: [facility] INVASIVE CV LAB;  Service: Cardiovascular;  Laterality: Left;   CHOLECYSTECTOMY     COLONOSCOPY WITH PROPOFOL N/A 12/24/2014   Procedure: COLONOSCOPY WITH PROPOFOL;  Surgeon: Wallace Cullens, MD;  Location: [facility] ENDOSCOPY;  Service: Gastroenterology;  Laterality: N/A;   CORONARY ARTERY BYPASS GRAFT     ESOPHAGOGASTRODUODENOSCOPY N/A 09/02/2017   Procedure: ESOPHAGOGASTRODUODENOSCOPY (EGD);  Surgeon: Pasty Spillers, MD;  Location: [facility] ENDOSCOPY;  Service: Endoscopy;  Laterality: N/A;   ESOPHAGOGASTRODUODENOSCOPY (EGD) WITH PROPOFOL N/A 12/24/2014   Procedure: ESOPHAGOGASTRODUODENOSCOPY (EGD) WITH PROPOFOL;  Surgeon: Wallace Cullens, MD;  Location: [facility] ENDOSCOPY;  Service: Gastroenterology;  Laterality: N/A;   JOINT REPLACEMENT     LOWER EXTREMITY ANGIOGRAPHY Left 07/17/2019   Procedure: Lower Extremity Angiography;  Surgeon: Annice Needy, MD;  Location: [facility] INVASIVE CV LAB;  Service: Cardiovascular;  Laterality: Left;   ROUX-EN-Y  GASTRIC BYPASS     loss over 100 pounds   TONSILLECTOMY     TOTAL KNEE ARTHROPLASTY Left     Family History  Problem Relation Age of Onset   Coronary artery disease Other    Stroke Maternal Grandmother    Diabetes Paternal Grandmother        diabetic coma   Heart attack Father    Heart attack Mother    Heart attack Maternal Grandfather    Heart attack Paternal Grandfather    Breast cancer Neg Hx    Social History:  reports that she has never smoked. She has never used smokeless tobacco. She reports that she does not drink alcohol and does not use drugs.  Allergies:  Allergies  Allergen Reactions   Niacin Other (See Comments)    Heart Attack Other reaction(s): Other (see comments) Heart Attack   Tramadol     Other reaction(s): Other (see comments) Intolerance, makes woozy   Ultram [Tramadol Hcl]     Intolerance, makes woozy   Baclofen Other (See Comments)    Dizziness Other reaction(s): Other (see comments) Dizziness   Cardura [Doxazosin Mesylate] Other (See Comments)    Dizzy with passing out spells   Doxazosin     Other reaction(s): Other (see comments) Dizzy with passing out spells Dizzy with passing out spells   Morphine And Codeine Nausea And Vomiting and Nausea Only   Other Rash    Gold metal (nickel in the gold) Gold metal Gold metal (nickel in the gold)    Medications Prior to Admission  Medication Sig Dispense Refill   allopurinol (ZYLOPRIM) 100 MG tablet Take 100 mg by mouth daily.     aspirin EC 81 MG tablet Take 1 tablet (81 mg total) by mouth daily. 90 tablet 0   atorvastatin (LIPITOR) 80 MG tablet Take 1 tablet (80 mg total) by mouth at bedtime.     blood glucose meter kit and supplies KIT Dispense based on patient and insurance preference. Use up to four times daily as directed. 1 each 0   calcitRIOL (ROCALTROL) 0.25 MCG capsule Take 0.25 mcg by mouth daily.     clopidogrel (PLAVIX) 75 MG tablet Take 1 tablet (75 mg total) by mouth daily.      diphenhydrAMINE (BENADRYL) 50 MG capsule Take 50 mg by mouth at bedtime as needed for sleep.     furosemide (LASIX) 40 MG tablet Take 1 tablet (40 mg total) by mouth daily as needed for fluid. 30 tablet    levothyroxine (SYNTHROID) 200 MCG tablet Take 200 mcg by mouth every Monday, Wednesday, and Friday.     losartan (COZAAR) 50 MG tablet Take 1 tablet (50 mg total) by mouth daily.     Multiple Vitamin (MULTIVITAMIN WITH MINERALS) TABS tablet Take 1 tablet by mouth daily.     Nutritional Supplements (,FEEDING SUPPLEMENT, PROSOURCE PLUS) liquid Take 30 mLs by mouth 3 (three) times daily between meals.     pantoprazole (PROTONIX) 20 MG tablet Take 20 mg by mouth daily.     senna-docusate (SENOKOT-S) 8.6-50  MG tablet Take 1 tablet by mouth at bedtime as needed for moderate constipation or mild constipation.      ROS: Unable to obtain due to somnolence and poor cooperation.   Physical Examination: On arrival: Blood pressure 130/65, pulse 72, temperature 97.9 F (36.6 C), temperature source Oral, resp. rate 14, SpO2 97 %. Follow up vitals: BP 139/70   Pulse 66   Temp 98.2 F (36.8 C) (Axillary)   Resp 14   SpO2 100%   Physical Exam  HEENT-  [place]/AT    Lungs- Respirations unlabored Extremities- No edema  Neurological Examination Mental Status: Somnolent. Will open eyes after persistent vocal and tactile stimuli. Follows simple commands when cooperative. Speech is sparse but fluent. Asks examiner on several attempts to stop exam so that she can rest.  Cranial Nerves: II: Blinks to threat in right hemifield. No blink to threat in left hemifield. Left pupil 2 mm and reactive. Right pupil 3 mm and sluggishly reactive.  III,IV, VI: No ptosis. Eyes are conjugate. Preferential gaze to the right; eyes briefly cross midline to the left to command.  V: Temp sensation subjectively equal bilaterally  VII: Face symmetric at rest. Does not smile to request.  VIII: Hearing intact to voice IX,X: No  hoarseness XI: Head preferentially rotated to the right.  XII: Midline tongue extension Motor: Moves RUE and RLE more briskly than left to request and in response to tactile stimulation. Will elevate RLE slightly antigravity. Does not move LLE antigravity in the context of poor cooperation/somnolence.   Sensory: Reacts to tactile stimuli x 4.  Deep Tendon Reflexes: 1+ bilateral brachioradialis and patellae Cerebellar: Not cooperative.   Gait: Unable to assess    Results for orders placed or performed during the hospital encounter of 12/04/22 (from the past 48 hour(s))  Basic metabolic panel     Status: Abnormal   Collection Time: 12/04/22  5:53 PM  Result Value Ref Range   Sodium 141 135 - 145 mmol/L   Potassium 4.8 3.5 - 5.1 mmol/L   Chloride 111 98 - 111 mmol/L   CO2 18 (L) 22 - 32 mmol/L   Glucose, Bld 130 (H) 70 - 99 mg/dL    Comment: Glucose reference range applies only to samples taken after fasting for at least 8 hours.   BUN 29 (H) 8 - 23 mg/dL   Creatinine, Ser 7.82 (H) 0.44 - 1.00 mg/dL   Calcium 8.9 8.9 - 95.6 mg/dL   GFR, Estimated 34 (L) >60 mL/min    Comment: (NOTE) Calculated using the CKD-EPI Creatinine Equation (2021)    Anion gap 12 5 - 15    Comment: Performed at [facility], [address] [place], [place] [address]  CBC     Status: Abnormal   Collection Time: 12/04/22  5:53 PM  Result Value Ref Range   WBC 6.4 4.0 - 10.5 K/uL   RBC 4.51 3.87 - 5.11 MIL/uL   Hemoglobin 12.1 12.0 - 15.0 g/dL   HCT 65.7 84.6 - 96.2 %   MCV 90.0 80.0 - 100.0 fL   MCH 26.8 26.0 - 34.0 pg   MCHC 29.8 (L) 30.0 - 36.0 g/dL   RDW 95.2 (H) 84.1 - 32.4 %   Platelets 271 150 - 400 K/uL   nRBC 0.0 0.0 - 0.2 %    Comment: Performed at [facility], [address], [place], [place] [address]  CK     Status: Abnormal   Collection Time: 12/04/22  5:53  PM  Result Value Ref Range   Total CK 260 (H) 38 - 234 U/L    Comment: Performed at [facility],  [address] [place] [place], [place] [address]   CT HEAD WO CONTRAST  Result Date: 12/04/2022 CLINICAL DATA:  Fall, altered mental status EXAM: CT HEAD WITHOUT CONTRAST CT CERVICAL SPINE WITHOUT CONTRAST TECHNIQUE: Multidetector CT imaging of the head and cervical spine was performed following the standard protocol without intravenous contrast. Multiplanar CT image reconstructions of the cervical spine were also generated. RADIATION DOSE REDUCTION: This exam was performed according to the departmental dose-optimization program which includes automated exposure control, adjustment of the mA and/or kV according to patient size and/or use of iterative reconstruction technique. COMPARISON:  08/28/2022 CT head, 04/07/2022 CT cervical spine FINDINGS: CT HEAD FINDINGS Brain: Hypodensity in the right temporal lobe, extending toward the right temporal occipital region, consistent with subacute infarct, with areas of hemorrhage, most likely hemorrhagic transformation. This is associated with local mass effect and 3 mm of right to left midline shift. Additional hypodensity is noted in the left parietal, occipital, and temporal lobes, consistent with remote infarct, with resolution of previously noted hyperdense hemorrhage. No hydrocephalus.  No extra-axial collection.  No mass. Vascular: No hyperdense vessel. Atherosclerotic calcifications in the intracranial carotid and vertebral arteries. Skull: Negative for fracture or focal lesion. Sinuses/Orbits: No acute finding. Other: The mastoid air cells are well aerated. CT CERVICAL SPINE FINDINGS Alignment: No traumatic listhesis. Skull base and vertebrae: No acute fracture or suspicious osseous lesion. Soft tissues and spinal canal: No prevertebral fluid or swelling. No visible canal hematoma. Disc levels: Degenerative changes in the cervical spine. Mild spinal canal stenosis at C3-C4. Upper chest: Negative. IMPRESSION: 1. Subacute infarct in the right temporal lobe, extending  toward the right temporal occipital region, with areas of hemorrhage, most likely hemorrhagic transformation. This is associated with local mass effect and 3 mm of right to left midline shift. 2. Remote infarct in the left parietal, occipital, and temporal lobes, with resolution of previously noted hyperdense hemorrhage. 3. No acute fracture or traumatic listhesis in the cervical spine. These results were called by telephone at the time of interpretation on 12/04/2022 at 6:00 pm to provider [facility] , who verbally acknowledged these results. Electronically Signed   By: Wiliam Ke M.D.   On: 12/04/2022 18:01   CT Cervical Spine Wo Contrast  Result Date: 12/04/2022 CLINICAL DATA:  Fall, altered mental status EXAM: CT HEAD WITHOUT CONTRAST CT CERVICAL SPINE WITHOUT CONTRAST TECHNIQUE: Multidetector CT imaging of the head and cervical spine was performed following the standard protocol without intravenous contrast. Multiplanar CT image reconstructions of the cervical spine were also generated. RADIATION DOSE REDUCTION: This exam was performed according to the departmental dose-optimization program which includes automated exposure control, adjustment of the mA and/or kV according to patient size and/or use of iterative reconstruction technique. COMPARISON:  08/28/2022 CT head, 04/07/2022 CT cervical spine FINDINGS: CT HEAD FINDINGS Brain: Hypodensity in the right temporal lobe, extending toward the right temporal occipital region, consistent with subacute infarct, with areas of hemorrhage, most likely hemorrhagic transformation. This is associated with local mass effect and 3 mm of right to left midline shift. Additional hypodensity is noted in the left parietal, occipital, and temporal lobes, consistent with remote infarct, with resolution of previously noted hyperdense hemorrhage. No hydrocephalus.  No extra-axial collection.  No mass. Vascular: No hyperdense vessel. Atherosclerotic calcifications in the  intracranial carotid and vertebral arteries. Skull: Negative for fracture  or focal lesion. Sinuses/Orbits: No acute finding. Other: The mastoid air cells are well aerated. CT CERVICAL SPINE FINDINGS Alignment: No traumatic listhesis. Skull base and vertebrae: No acute fracture or suspicious osseous lesion. Soft tissues and spinal canal: No prevertebral fluid or swelling. No visible canal hematoma. Disc levels: Degenerative changes in the cervical spine. Mild spinal canal stenosis at C3-C4. Upper chest: Negative. IMPRESSION: 1. Subacute infarct in the right temporal lobe, extending toward the right temporal occipital region, with areas of hemorrhage, most likely hemorrhagic transformation. This is associated with local mass effect and 3 mm of right to left midline shift. 2. Remote infarct in the left parietal, occipital, and temporal lobes, with resolution of previously noted hyperdense hemorrhage. 3. No acute fracture or traumatic listhesis in the cervical spine. These results were called by telephone at the time of interpretation on 12/04/2022 at 6:00 pm to provider [facility] , who verbally acknowledged these results. Electronically Signed   By: Wiliam Ke M.D.   On: 12/04/2022 18:01     Assessment: [name] is an 72 y.o. 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 Her caregiver had come to check on the patient after attempting to contact her by phone with no answer. She was found on the floor with AMS present; incontinent of urine and feces. She was unable to get up off the ground with caretaker  assistance and EMS was called. It was estimated that the patient had possibly been down for about 10-15 hrs.   - Exam reveals a somnolent and poorly cooperative female who appears older than her stated age. Left pupil 2 mm and reactive. Right pupil 3 mm and sluggishly reactive. Left visual field cut and relative left sided weakness when compared to the right in the context of poor cooperation.  - CT head: Large subacute infarct in the right temporal lobe, extending toward the right temporal occipital region, with areas of hemorrhage, most likely hemorrhagic transformation. This is associated with mild local mass effect and 3 mm of right to left midline shift. Also noted is a large remote infarct in the left parietal, occipital, and temporal lobes, with resolution of previously noted hyperdense hemorrhage. - EKG: Normal sinus rhythm; Anterolateral infarct , age undetermined - Labs: BUN 29, Cr 1.6, with eGFR low at 34. Glucose 130. Ca, Na and K normal. BNP 260. CBC without leukocytosis.  - DDx for the current and prior hemorrhagic strokes includes CAA or undiagnosed cardioembolic mechanism  Plan: - MRI brain - CTA of head and neck - TTE - Cardiac telemetry - HgbA1c, fasting lipid panel - PT consult, OT consult, Speech consult - BP management with SBP goal of 130-150 given extensive petechial hemorrhagic transformation within the bed of the subacute right MCA stroke.  - Holding ASA and Plavix due to petechial hemorrhagic transformation of the right MCA stroke - Continue atorvastatin - SSI - Risk factor modification - Frequent neuro checks - NPO until passes stroke swallow screen - Repeat CT head in 24 hours to assess for stability of the petechial hemorrhage.  - The patient states that she wishes to remain DNR, which was also her  code status in March.    Electronically signed: Dr. Caryl Pina 12/04/2022, 9:53 PM

## 2022-12-04 NOTE — ED Notes (Signed)
Called report to [facility] RM 2 nurse at this time. Report given to [facility]

## 2022-12-04 NOTE — Progress Notes (Signed)
eLink Physician-Brief Progress Note Patient Name: [name] DOB: 07-Dec-1950 MRN: [id number]   Date of Service  12/04/2022  HPI/Events of Note  72 y.o. female who presents to the emergency department today via EMS after being found on the ground.  Ct head showed  Subacute infarct in the right temporal lobe, extending toward the  Right temporal occipital region, with areas of hemorrhage, most  likely hemorrhagic transformation. This is associated with local  mass effect and 3 mm of right to left midline shift. Patient now admitted for further eval and management.    eICU Interventions  Chart reviewed     Intervention Category Evaluation Type: New Patient Evaluation  Henry Russel, P 12/04/2022, 11:47 PM

## 2022-12-04 NOTE — ED Notes (Signed)
Alerted POA Marie of transfer to [facility]

## 2022-12-04 NOTE — ED Notes (Signed)
Called [facility] for possible transport per Dr. Derrill Kay accepted by Dr. Amada Jupiter

## 2022-12-04 NOTE — ED Notes (Signed)
EMTALA reviewed by this RN.  

## 2022-12-04 NOTE — ED Provider Notes (Signed)
[facility] Provider Note    Event Date/Time   First MD Initiated Contact with Patient 12/04/22 1740     (approximate)   History   Fall   HPI  [name] is a 72 y.o. female who presents to the emergency department today via EMS after being found on the ground.  Patient is unable to give any significant history.  I was able to talk to the POA over the telephone who was the one who found her.  Apparently they started becoming concerned when she was not answering the phone when she was supposed to be to friend this afternoon.  When they first got to the house the patient was found on the ground but stated she did not want to come to the emergency department.  She however was unable to get up off the ground so 911 was called.  Patient does have history of CVA and hemorrhagic conversion in the past.     Physical Exam   Triage Vital Signs: ED Triage Vitals  Enc Vitals Group     BP 12/04/22 1722 (!) 177/82     Pulse Rate 12/04/22 1722 63     Resp 12/04/22 1722 18     Temp 12/04/22 1728 98.5 F (36.9 C)     Temp Source 12/04/22 1728 Rectal     SpO2 12/04/22 1722 100 %     Weight --      Height --      Head Circumference --      Peak Flow --      Pain Score 12/04/22 1703 5     Pain Loc --      Pain Edu? --      Excl. in GC? --     Most recent vital signs: Vitals:   12/04/22 1722 12/04/22 1728  BP: (!) 177/82   Pulse: 63   Resp: 18   Temp:  98.5 F (36.9 C)  SpO2: 100%    General: Awake, alert, oriented to name, not events CV:  Good peripheral perfusion. Regular rate and rhythm. Resp:  Normal effort. Lungs clear. Abd:  No distention.  Other:  Face symmetric. Strength 5/5 in upper and lower extremities. Sensation grossly intact.    ED Results / Procedures / Treatments   Labs (all labs ordered are listed, but only abnormal results are displayed) Labs Reviewed  BASIC METABOLIC PANEL - Abnormal; Notable for the following components:       Result Value   CO2 18 (*)    Glucose, Bld 130 (*)    BUN 29 (*)    Creatinine, Ser 1.60 (*)    GFR, Estimated 34 (*)    All other components within normal limits  CBC - Abnormal; Notable for the following components:   MCHC 29.8 (*)    RDW 17.3 (*)    All other components within normal limits  CK - Abnormal; Notable for the following components:   Total CK 260 (*)    All other components within normal limits  URINALYSIS, ROUTINE W REFLEX MICROSCOPIC  CBG MONITORING, ED     EKG  I, Phineas Semen, attending physician, personally viewed and interpreted this EKG  EKG Time: 1727 Rate: 63 Rhythm: normal sinus rhythm Axis: right axis deviation Intervals: qtc 474 QRS: q waves v1, II, V1 ST changes: no st elevation Impression: abnormal ekg  RADIOLOGY I independently interpreted and visualized the CT head. My interpretation: new right sided stroke with hemorrhage Radiology  interpretation:  IMPRESSION:  1. Subacute infarct in the right temporal lobe, extending toward the  right temporal occipital region, with areas of hemorrhage, most  likely hemorrhagic transformation. This is associated with local  mass effect and 3 mm of right to left midline shift.  2. Remote infarct in the left parietal, occipital, and temporal  lobes, with resolution of previously noted hyperdense hemorrhage.  3. No acute fracture or traumatic listhesis in the cervical spine.   I, Phineas Semen, personally discussed these images and results by phone with the on-call radiologist and used this discussion as part of my medical decision making.      PROCEDURES:  Critical Care performed: Yes  CRITICAL CARE Performed by: Phineas Semen   Total critical care time: 40 minutes  Critical care time was exclusive of separately billable procedures and treating other patients.  Critical care was necessary to treat or prevent imminent or life-threatening deterioration.  Critical care was time spent  personally by me on the following activities: development of treatment plan with patient and/or surrogate as well as nursing, discussions with consultants, evaluation of patient's response to treatment, examination of patient, obtaining history from patient or surrogate, ordering and performing treatments and interventions, ordering and review of laboratory studies, ordering and review of radiographic studies, pulse oximetry and re-evaluation of patient's condition.   Procedures    MEDICATIONS ORDERED IN ED: Medications - No data to display   IMPRESSION / MDM / ASSESSMENT AND PLAN / ED COURSE  I reviewed the triage vital signs and the nursing notes.                              Differential diagnosis includes, but is not limited to, stroke, infection, anemia  Patient's presentation is most consistent with acute presentation with potential threat to life or bodily function.   The patient is on the cardiac monitor to evaluate for evidence of arrhythmia and/or significant heart rate changes.  Patient presented to the emergency department today because of concerns for being found on the ground and weakness.  Patient is confused and cannot give any significant history.  No focal neurodeficits on my exam.  CT however is concerning for new right-sided stroke with hemorrhagic conversion.  Patient had similar history of stroke back in March of this year.  Discussed with POA over telephone.  Discussed with Dr. Jerrell Belfast with neurology who evaluated the imaging and helped facilitate transfer to [facility] for higher level of care.  Patient was started on Cleviprex for blood pressure control.      FINAL CLINICAL IMPRESSION(S) / ED DIAGNOSES   Final diagnoses:  Cerebrovascular accident (CVA) with intracranial hemorrhage (HCC)    Note:  This document was prepared using Dragon voice recognition software and may include unintentional dictation errors.    Phineas Semen, MD 12/04/22 [phone]

## 2022-12-04 NOTE — ED Notes (Signed)
Pt saturated in urine with strong odor. Pt cleaned up and has redness noted to buttocks and inner thighs. Pt placed in clean gown, cleaned up with wipes and placed in clean brief. Pt given warm blankets.

## 2022-12-05 ENCOUNTER — Inpatient Hospital Stay (HOSPITAL_COMMUNITY): Payer: Medicare Other

## 2022-12-05 DIAGNOSIS — I63311 Cerebral infarction due to thrombosis of right middle cerebral artery: Secondary | ICD-10-CM

## 2022-12-05 LAB — MRSA NEXT GEN BY PCR, NASAL: MRSA by PCR Next Gen: NOT DETECTED

## 2022-12-05 LAB — GLUCOSE, CAPILLARY
Glucose-Capillary: 149 mg/dL — ABNORMAL HIGH (ref 70–99)
Glucose-Capillary: 94 mg/dL (ref 70–99)
Glucose-Capillary: 96 mg/dL (ref 70–99)
Glucose-Capillary: 98 mg/dL (ref 70–99)

## 2022-12-05 LAB — LIPID PANEL
Cholesterol: 147 mg/dL (ref 0–200)
HDL: 58 mg/dL (ref >40)
LDL Cholesterol: 70 mg/dL (ref 0–99)
Total CHOL/HDL Ratio: 2.5 RATIO
Triglycerides: 97 mg/dL (ref <150)
VLDL: 19 mg/dL (ref 0–40)

## 2022-12-05 MED ORDER — ATORVASTATIN CALCIUM 80 MG PO TABS
80.0000 mg | ORAL_TABLET | Freq: Every day | ORAL | Status: DC
  Administered 2022-12-05 – 2022-12-09 (×5): 80 mg via ORAL
  Filled 2022-12-05 (×4): qty 1

## 2022-12-05 MED ORDER — PROSOURCE PLUS PO LIQD
30.0000 mL | Freq: Three times a day (TID) | ORAL | Status: DC
  Administered 2022-12-05: 30 mL via ORAL
  Filled 2022-12-05 (×2): qty 30

## 2022-12-05 MED ORDER — ALLOPURINOL 100 MG PO TABS
100.0000 mg | ORAL_TABLET | Freq: Every day | ORAL | Status: DC
  Administered 2022-12-05 – 2022-12-10 (×6): 100 mg via ORAL
  Filled 2022-12-05 (×6): qty 1

## 2022-12-05 MED ORDER — PROSOURCE PLUS PO LIQD
30.0000 mL | Freq: Three times a day (TID) | ORAL | Status: DC
  Administered 2022-12-06 – 2022-12-10 (×13): 30 mL via ORAL
  Filled 2022-12-05 (×16): qty 30

## 2022-12-05 MED ORDER — CLEVIDIPINE BUTYRATE 0.5 MG/ML IV EMUL
0.0000 mg/h | INTRAVENOUS | Status: DC
  Administered 2022-12-05: 1 mg/h via INTRAVENOUS

## 2022-12-05 MED ORDER — ORAL CARE MOUTH RINSE: 15.0000 mL | OROMUCOSAL | Status: DC | PRN

## 2022-12-05 MED ORDER — LOSARTAN POTASSIUM 50 MG PO TABS
50.0000 mg | ORAL_TABLET | Freq: Every day | ORAL | Status: DC
  Administered 2022-12-05 – 2022-12-10 (×6): 50 mg via ORAL
  Filled 2022-12-05 (×6): qty 1

## 2022-12-05 MED ORDER — LEVOTHYROXINE SODIUM 100 MCG PO TABS
200.0000 ug | ORAL_TABLET | ORAL | Status: DC
  Administered 2022-12-07 – 2022-12-09 (×2): 200 ug via ORAL
  Filled 2022-12-05 (×3): qty 2

## 2022-12-05 MED ORDER — CALCITRIOL 0.25 MCG PO CAPS
0.2500 ug | ORAL_CAPSULE | Freq: Every day | ORAL | Status: DC
  Administered 2022-12-05 – 2022-12-10 (×4): 0.25 ug via ORAL
  Filled 2022-12-05 (×6): qty 1

## 2022-12-05 MED ORDER — INSULIN ASPART 100 UNIT/ML IJ SOLN
0.0000 [IU] | INTRAMUSCULAR | Status: DC
  Administered 2022-12-05 – 2022-12-07 (×6): 2 [IU] via SUBCUTANEOUS
  Administered 2022-12-07: 5 [IU] via SUBCUTANEOUS
  Administered 2022-12-08 – 2022-12-09 (×3): 2 [IU] via SUBCUTANEOUS
  Administered 2022-12-09: 5 [IU] via SUBCUTANEOUS
  Administered 2022-12-10: 2 [IU] via SUBCUTANEOUS

## 2022-12-05 MED ORDER — CHLORHEXIDINE GLUCONATE CLOTH 2 % EX PADS
6.0000 | MEDICATED_PAD | Freq: Every day | CUTANEOUS | Status: DC
  Administered 2022-12-05 – 2022-12-10 (×6): 6 via TOPICAL

## 2022-12-05 MED ORDER — ADULT MULTIVITAMIN W/MINERALS CH
1.0000 | ORAL_TABLET | Freq: Every day | ORAL | Status: DC
  Administered 2022-12-05 – 2022-12-10 (×6): 1 via ORAL
  Filled 2022-12-05 (×6): qty 1

## 2022-12-05 NOTE — Evaluation (Signed)
Physical Therapy Evaluation Patient Details Name: [name] MRN: [id number] DOB: [birth year] Today's Date: 12/05/2022  History of Present Illness  Pt is a 72 y.o. F who presents 12/04/2022 after being found by her caregive ron the floor. CT revealing subacute infarct in the right temporal lobe, extending toward the right temporal occipital region, with areas of hemorrhage, most likely hemorrhagic transformation. This was associated with local mass effect and 3 mm of right to left midline shift. Also noted was a remote infarct in the left parietal, occipital, and temporal lobes, with resolution of previously noted hyperdense hemorrhage. CT cervical spine negative for acute fx. 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  Clinical Impression  Pt admitted with above. Pt drowsy upon evaluation, with poor effort overall. Pt not oriented and follows one step commands inconsistently. Requiring mod-max assist + 2 for bed mobility and transfers to standing. Gait deferred due to urinary incontinence and pt frequently attempting to lie back down, stating she wanted to sleep. Will continue to progress mobility as tolerated. Patient will benefit from continued inpatient follow up therapy, <3 hours/day.      Recommendations for follow up therapy are one component of a multi-disciplinary discharge planning process, led by the attending physician.  Recommendations may be updated based on patient status, additional functional criteria and insurance authorization.  Follow Up Recommendations Can patient physically be transported by private vehicle: No     Assistance Recommended at  Discharge Frequent or constant Supervision/Assistance  Patient can return home with the following  A lot of help with walking and/or transfers;A lot of help with bathing/dressing/bathroom    Equipment Recommendations Other (comment) (TBA)  Recommendations for Other Services       Functional Status Assessment Patient has had a recent decline in their functional status and demonstrates the ability to make significant improvements in function in a reasonable and predictable amount of time.     Precautions / Restrictions Precautions Precautions: Fall;Other (comment) Precaution Comments: incontinence Restrictions Weight Bearing Restrictions: No      Mobility  Bed Mobility Overal bed mobility: Needs Assistance Bed Mobility: Supine to Sit, Sit to Supine     Supine to sit: Max assist Sit to supine: Min assist   General bed mobility comments: Increased assist to sit up in the bed due to lack of initiation, pt returning self to bed with minA for LE managemenet    Transfers Overall transfer level: Needs assistance Equipment used: Rolling walker (2 wheels) Transfers: Sit to/from Stand Sit to Stand: Mod assist, +2 physical assistance           General transfer comment: ModA + 2 to rise from edge of bed to RW, urinary incontinence in standing. attempted 2nd stand, however, pt adamant about lying back down    Ambulation/Gait               General Gait Details: pt refusing  Stairs            Wheelchair Mobility    Modified Rankin (Stroke Patients Only) Modified Rankin (Stroke Patients Only) Pre-Morbid Rankin Score: Moderate disability Modified Rankin: Moderately severe disability     Balance Overall balance assessment: Needs assistance Sitting-balance support: Feet supported Sitting balance-Leahy Scale:  Fair     Standing balance support: Bilateral upper extremity supported Standing balance-Leahy Scale: Poor                                Pertinent Vitals/Pain Pain Assessment Pain Assessment: No/denies pain    Home Living Family/patient expects to be discharged to:: Private residence Living Arrangements: Alone Available Help at Discharge: Friend(s);Available PRN/intermittently Type of Home: Apartment Home Access: Stairs to enter Entrance Stairs-Rails: None Entrance Stairs-Number of Steps: 3 Alternate Level Stairs-Number of Steps: flight Home Layout: Two level;Able to live on main level with bedroom/bathroom Home Equipment: Cane - Programmer, applications (2 wheels);Rollator (4 wheels);BSC/3in1 Additional Comments: Information obtained from prior admission (unsure of accuracy currently)    Prior Function Prior Level of Function : Needs assist             Mobility Comments: using RW on prior admission ADLs Comments: has caregiver, however, pt unable to describe what they assist with or how often they come     Hand Dominance   Dominant Hand: Right    Extremity/Trunk Assessment   Upper Extremity Assessment Upper Extremity Assessment: Defer to OT evaluation    Lower Extremity Assessment Lower Extremity Assessment: Difficult to assess due to impaired cognition;RLE deficits/detail;LLE deficits/detail RLE Deficits / Details: No knee buckle in standing LLE Deficits / Details: No knee buckle in standing       Communication      Cognition Arousal/Alertness: Lethargic Behavior During Therapy: Flat affect Overall Cognitive Status: Impaired/Different from baseline Area of Impairment: Orientation, Attention, Memory, Following commands, Safety/judgement, Awareness, Problem solving                 Orientation Level: Disoriented to, Place, Time, Situation Current Attention Level: Focused Memory: Decreased short-term memory Following Commands: Follows one step commands inconsistently Safety/Judgement: Decreased awareness of safety, Decreased awareness of deficits Awareness: Intellectual Problem Solving:  Requires verbal cues General Comments: Pt not oriented to birthday or place/time/situation. sleepy with poor effort throughout assessment, frequently attempting to lie back stating she is tired. urinary incontinence standing EOB        General Comments      Exercises     Assessment/Plan    PT Assessment Patient needs continued PT services  PT Problem List Decreased strength;Decreased activity tolerance;Decreased balance;Decreased mobility;Decreased cognition;Decreased safety awareness       PT Treatment Interventions DME instruction;Gait training;Stair training;Functional mobility training;Therapeutic activities;Therapeutic exercise;Balance training;Patient/family education    PT Goals (Current goals can be found in the Care Plan section)  Acute Rehab PT Goals Patient Stated Goal: to go to sleep PT Goal Formulation: With patient Time For Goal Achievement: 12/19/22 Potential to Achieve Goals: Fair    Frequency Min 3X/week     Co-evaluation               AM-PAC PT "6 Clicks" Mobility  Outcome Measure Help needed turning from your back to your side while in a flat bed without using bedrails?: A Little Help needed moving from lying on your back to sitting on the side of a flat bed without using bedrails?: A Lot Help needed moving to and from a bed to a chair (including a wheelchair)?: A Lot Help needed standing up from a chair using your arms (e.g., wheelchair or bedside chair)?: A Lot Help needed to walk in hospital room?: Total Help needed climbing 3-5 steps with a railing? : Total 6 Click Score: 11  End of Session Equipment Utilized During Treatment: Gait belt Activity Tolerance: Patient limited by fatigue Patient left: in bed;with call bell/phone within reach;with bed alarm set Nurse Communication: Mobility status PT Visit Diagnosis: Unsteadiness on feet (R26.81);History of falling (Z91.81);Difficulty in walking, not elsewhere classified (R26.2)    Time:  0981-1914 PT Time Calculation (min) (ACUTE ONLY): 22 min   Charges:   PT Evaluation $PT Eval Moderate Complexity: 1 Mod          Lillia Pauls, PT, DPT Acute Rehabilitation Services Office [phone]   Norval Morton 12/05/2022, 12:10 PM

## 2022-12-05 NOTE — Progress Notes (Addendum)
STROKE TEAM PROGRESS NOTE   BRIEF HPI [name] is a 72 y.o. 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 Her caregiver had come to check on the patient after attempting to contact her by phone with no answer .  SIGNIFICANT HOSPITAL EVENTS 3/18- discharged from [facility] 3/22- ED [facility] as code stroke and sent to [facility] HISTORY/SUBJECTIVE Plan for CTA Head and Neck, MRI brain, and TTE. She is sleeping on arrival and is easily agitated during her exam.  She repeatedly tells [place] " that enough" and does not consistently follow commands or answer questions.  It does appear that she is moving all of her extremities antigravity.  OBJECTIVE  CBC    Component Value Date/Time   WBC 6.4 12/04/2022 1753   RBC 4.51 12/04/2022 1753   HGB 12.1 12/04/2022 1753   HGB 8.3 (L) 02/05/2012 0833   HCT 40.6 12/04/2022 1753   HCT 26.8 (L) 01/25/2012 0837   PLT 271 12/04/2022 1753   PLT 227 02/03/2012 0426   MCV 90.0 12/04/2022 1753   MCV 70 (L) 01/25/2012 0837   MCH 26.8 12/04/2022 1753   MCHC 29.8 (L) 12/04/2022 1753   RDW 17.3 (H) 12/04/2022 1753   RDW 17.8 (H) 01/25/2012 0837   LYMPHSABS 1.6 08/28/2022 1637   MONOABS 1.0 08/28/2022 1637   EOSABS 0.1 08/28/2022 1637   BASOSABS 0.0 08/28/2022 1637    BMET    Component Value Date/Time   NA 141 12/04/2022 1753   NA 144 02/03/2012 0426   K 4.8 12/04/2022 1753   K 4.4 02/03/2012 0426   CL 111 12/04/2022 1753   CL 113 (H) 02/03/2012 0426   CO2 18 (L) 12/04/2022 1753   CO2 24 02/03/2012 0426   GLUCOSE 130 (H) 12/04/2022 1753   GLUCOSE 153 (H)  02/03/2012 0426   BUN 29 (H) 12/04/2022 1753   BUN 22 (H) 02/03/2012 0426   CREATININE 1.60 (H) 12/04/2022 1753   CREATININE 1.76 (H) 08/18/2013 1019   CALCIUM 8.9 12/04/2022 1753   CALCIUM 8.1 (L) 02/03/2012 0426   GFRNONAA 34 (L) 12/04/2022 1753   GFRNONAA 30 (L) 08/18/2013 1019    IMAGING past 24 hours CT HEAD WO CONTRAST  Result Date: 12/04/2022 CLINICAL DATA:  Fall, altered mental status EXAM: CT HEAD WITHOUT CONTRAST CT CERVICAL SPINE WITHOUT CONTRAST TECHNIQUE: Multidetector CT imaging of the head and cervical spine was performed following the standard protocol without intravenous contrast. Multiplanar CT image reconstructions of the cervical spine were also generated. RADIATION DOSE REDUCTION: This exam was performed according to the departmental dose-optimization program which includes automated exposure control, adjustment of the mA and/or kV according to patient size and/or use of iterative reconstruction technique. COMPARISON:  08/28/2022 CT head, 04/07/2022 CT cervical spine FINDINGS: CT HEAD FINDINGS Brain: Hypodensity in the right temporal lobe, extending toward the right temporal occipital region, consistent with subacute infarct, with areas of hemorrhage, most likely hemorrhagic transformation. This is associated with local mass effect and 3 mm of right to left midline shift. Additional hypodensity is noted in the left parietal, occipital, and temporal lobes, consistent with remote infarct, with resolution of  previously noted hyperdense hemorrhage. No hydrocephalus.  No extra-axial collection.  No mass. Vascular: No hyperdense vessel. Atherosclerotic calcifications in the intracranial carotid and vertebral arteries. Skull: Negative for fracture or focal lesion. Sinuses/Orbits: No acute finding. Other: The mastoid air cells are well aerated. CT CERVICAL SPINE FINDINGS Alignment: No traumatic listhesis. Skull base and vertebrae: No acute fracture or suspicious osseous lesion. Soft  tissues and spinal canal: No prevertebral fluid or swelling. No visible canal hematoma. Disc levels: Degenerative changes in the cervical spine. Mild spinal canal stenosis at C3-C4. Upper chest: Negative. IMPRESSION: 1. Subacute infarct in the right temporal lobe, extending toward the right temporal occipital region, with areas of hemorrhage, most likely hemorrhagic transformation. This is associated with local mass effect and 3 mm of right to left midline shift. 2. Remote infarct in the left parietal, occipital, and temporal lobes, with resolution of previously noted hyperdense hemorrhage. 3. No acute fracture or traumatic listhesis in the cervical spine. These results were called by telephone at the time of interpretation on 12/04/2022 at 6:00 pm to provider [facility] , who verbally acknowledged these results. Electronically Signed   By: Wiliam Ke M.D.   On: 12/04/2022 18:01   CT Cervical Spine Wo Contrast  Result Date: 12/04/2022 CLINICAL DATA:  Fall, altered mental status EXAM: CT HEAD WITHOUT CONTRAST CT CERVICAL SPINE WITHOUT CONTRAST TECHNIQUE: Multidetector CT imaging of the head and cervical spine was performed following the standard protocol without intravenous contrast. Multiplanar CT image reconstructions of the cervical spine were also generated. RADIATION DOSE REDUCTION: This exam was performed according to the departmental dose-optimization program which includes automated exposure control, adjustment of the mA and/or kV according to patient size and/or use of iterative reconstruction technique. COMPARISON:  08/28/2022 CT head, 04/07/2022 CT cervical spine FINDINGS: CT HEAD FINDINGS Brain: Hypodensity in the right temporal lobe, extending toward the right temporal occipital region, consistent with subacute infarct, with areas of hemorrhage, most likely hemorrhagic transformation. This is associated with local mass effect and 3 mm of right to left midline shift. Additional hypodensity is  noted in the left parietal, occipital, and temporal lobes, consistent with remote infarct, with resolution of previously noted hyperdense hemorrhage. No hydrocephalus.  No extra-axial collection.  No mass. Vascular: No hyperdense vessel. Atherosclerotic calcifications in the intracranial carotid and vertebral arteries. Skull: Negative for fracture or focal lesion. Sinuses/Orbits: No acute finding. Other: The mastoid air cells are well aerated. CT CERVICAL SPINE FINDINGS Alignment: No traumatic listhesis. Skull base and vertebrae: No acute fracture or suspicious osseous lesion. Soft tissues and spinal canal: No prevertebral fluid or swelling. No visible canal hematoma. Disc levels: Degenerative changes in the cervical spine. Mild spinal canal stenosis at C3-C4. Upper chest: Negative. IMPRESSION: 1. Subacute infarct in the right temporal lobe, extending toward the right temporal occipital region, with areas of hemorrhage, most likely hemorrhagic transformation. This is associated with local mass effect and 3 mm of right to left midline shift. 2. Remote infarct in the left parietal, occipital, and temporal lobes, with resolution of previously noted hyperdense hemorrhage. 3. No acute fracture or traumatic listhesis in the cervical spine. These results were called by telephone at the time of interpretation on 12/04/2022 at 6:00 pm to provider [facility] , who verbally acknowledged these results. Electronically Signed   By: Wiliam Ke M.D.   On: 12/04/2022 18:01    Vitals:   12/05/22 0545 12/05/22 0600 12/05/22 0700 12/05/22 0800  BP: (!) 142/61 138/70  (!) 149/83  Pulse:  60 66 72 61  Resp: 17 16 19 15   Temp:    97.8 F (36.6 C)  TempSrc:    Axillary  SpO2: 99% 98% 99% 99%     PHYSICAL EXAM General:  Alert, well-nourished, well-developed patient in no acute distress Psych:  Mood and affect appropriate for situation CV: Regular rate and rhythm on monitor Respiratory:  Regular, unlabored  respirations on room air GI: Abdomen soft and nontender   NEURO:  Mental Status: Drowsy but does respond to noxious stimuli.  She is uncooperative throughout our exam and does not consistently follow commands or answer questions. Speech/Language: Speech is limited but is without dysarthria Cranial Nerves:  II: Pupils are equal and reactive with forced eye opening III, IV, VI: Preferential gaze to the right, does cross midline V: Sensation is intact to light touch and symmetrical to face.  VII: Face is symmetrical resting  VIII: hearing intact to voice. IX, X: Palate elevates symmetrically. Phonation is normal.  ZO:XWRUEAVW shrug 5/5. XII: tongue is midline without fasciculations. Motor: Moves right upper extremity and right lower extremity more briskly than the left side. Tone: is normal and bulk is normal Sensation-reacts to noxious stimuli in all 4 extremities Coordination: Does not complete Gait- deferred   ASSESSMENT/PLAN  Acute Ischemic Infarct: Subacute infarct in the right temporal lobe with hemorrhagic transformation Etiology: Unclear at this time Code Stroke CT head - Subacute infarct in the right temporal lobe, extending toward the right temporal occipital region, with areas of hemorrhage, most likely hemorrhagic transformation. This is associated with local mass effect and 3 mm of right to left midline shift. CTA head and neck-pending MRI-pending 2D Echo pending LDL 75 HgbA1c 7.7 VTE prophylaxis -SCDs aspirin 81 mg daily and clopidogrel 75 mg daily prior to admission, now on No antithrombotic due to hemorrhagic transformation Therapy recommendations: Pending evaluation Disposition: Pending  Hx of Stroke/TIA March 2024 - PCA infarction with large hemorrhagic transformation  Treated at [facility] 352 long-term cardiac monitor, however we do not have results for it at this time. July 2023-acute infarct involving the cortical/subcortical right frontal lobe  Hypertension Home  meds: Losartan, furosemide Cleviprex infusion As needed medications-labetalol, hydralazine Blood Pressure Goal: SBP between 130-150 for 24 hours and then less than 160   Hyperlipidemia Home meds: Atorvastatin 80 mg, resumed in hospital LDL 75, goal < 70 Continue statin at discharge  Diabetes type II Uncontrolled Home meds: None HgbA1c 7.7, goal < 7.0 CBGs SSI Recommend close follow-up with PCP for better DM control  Dysphagia Patient has post-stroke dysphagia, SLP consulted    Diet   Diet NPO time specified   Advance diet as tolerated  Other Stroke Risk Factors Coronary artery disease S/p CAGB Congestive heart failure  Other Active Problems CKD Cr 1.6 GFR 34 Hashimoto's Synthroid 200 mg Monday Wednesday Friday  Hospital day # 1  Patient seen and examined by NP/APP with MD. MD to update note as needed.   Elmer Picker, DNP, FNP-BC [facility] Pager: [phone] STROKE MD NOTE :  I have personally obtained history,examined this patient, reviewed notes, independently viewed imaging studies, participated in medical decision making and plan of care.ROS completed by me personally and pertinent positives fully documented  I have made any additions or clarifications directly to the above note. Agree with note above.  Patient presented with altered mental status with unclear last known well.  CT scan and MRI show a hemorrhagic right MCA infarct.  Patient's exam is limited due to lack of  cooperation but does not appear to have significant focal weakness.  Continue close neurological observation and strict blood pressure control with systolic goal below 160.  She has recently history of left PCA infarct hence suspect cardiogenic etiology likely.  Check MRI scan, CT angiograms and echocardiogram.  Continue cardiac monitoring.  No family available at the bedside for discussion.This patient is critically ill and at significant risk of neurological worsening, death and  care requires constant monitoring of vital signs, hemodynamics,respiratory and cardiac monitoring, extensive review of multiple databases, frequent neurological assessment, discussion with family, other specialists and medical decision making of high complexity.I have made any additions or clarifications directly to the above note.This critical care time does not reflect procedure time, or teaching time or supervisory time of PA/NP/Med Resident etc but could involve care discussion time.  I spent 30 minutes of neurocritical care time  in the care of  this patient.      Delia Heady, MD Medical Director [facility] Pager: [phone] 12/05/2022 4:08 PM  To contact Stroke Continuity provider, please refer to [email or web address]. After hours, contact General Neurology

## 2022-12-05 NOTE — Plan of Care (Signed)
Pt alert and oriented to person and place.  Follows commands.  Pt withdrawn.  Speech evaluation done and diet ordered.  PT OT to evaluate pt today.  Titrating Clevedipine for bp goal.

## 2022-12-05 NOTE — Evaluation (Signed)
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 06/24/2022   Weakness of right upper extremity 05/20/2022   CVA (cerebral vascular accident) (HCC) 05/20/2022   Hypoglycemia 04/07/2022   Abnormal EKG 04/07/2022   Ischemic pain of left foot    Ischemic foot 07/15/2019   Healthcare maintenance 11/30/2018   Amaurosis fugax of right eye 12/14/2017   Bilateral leg edema 09/28/2017   Hyperlipidemia, mixed 09/28/2017   Anemia 09/01/2017   Chronic insomnia 01/26/2017   Postmenopausal 08/05/2016   CKD (chronic kidney disease) stage 4, GFR 15-29 ml/min (HCC) 10/16/2014   Hypothyroidism 08/06/2014   Type 2 diabetes mellitus without complication (HCC) 08/06/2014   Feces contents abnormal 11/07/2013   HYPERTENSION, BENIGN 11/10/2008   CAD, ARTERY BYPASS GRAFT 11/10/2008   Past Medical History:  Past Medical History:  Diagnosis Date   Acute CVA (cerebrovascular accident) (HCC) 12/18/2021   Acute gastric ulcer without hemorrhage or perforation    Acute on chronic systolic CHF (congestive heart failure), NYHA class 3 (HCC) 08/03/2019   Formatting of this note might be different from the original. Global ef 35%   Anemia    Anemia, iron deficiency 11/07/2013   Arthritis    CAD (coronary artery disease)    Cerebrovascular accident [facility])    CHF (congestive heart failure) (HCC)    Chicken pox    Chronic kidney disease    Colitis     ? at 14   Coronary artery disease with hx of myocardial infarct w/o hx of CABG 05/21/2015   Diabetes mellitus    DOE (dyspnea on exertion) 09/01/2017   H/O: stroke 07/15/2019   Hashimoto thyroiditis    w subsequent hypothyroidism   Headache    Heme + stool 11/07/2013   History of anemia due to chronic kidney disease 12/18/2021   HTN (hypertension)    Hyperlipidemia    Hypothyroidism    Myocardial infarction [facility])    PAD (peripheral artery disease) (HCC)    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   Sleep apnea    Type 2 diabetes mellitus with diabetic peripheral angiopathy without gangrene, without long-term current use of insulin (HCC)    Past Surgical History:  Past Surgical History:  Procedure Laterality Date   ABDOMINAL HYSTERECTOMY     CAROTID ANGIOGRAPHY Left 07/21/2022   Procedure: CAROTID ANGIOGRAPHY;  Surgeon: Renford Dills, MD;  Location: [facility] INVASIVE CV LAB;  Service: Cardiovascular;  Laterality: Left;   CHOLECYSTECTOMY     COLONOSCOPY WITH PROPOFOL N/A 12/24/2014   Procedure: COLONOSCOPY WITH PROPOFOL;  Surgeon: Wallace Cullens, MD;  Location: [facility] ENDOSCOPY;  Service: Gastroenterology;  Laterality: N/A;   CORONARY ARTERY BYPASS GRAFT     ESOPHAGOGASTRODUODENOSCOPY N/A 09/02/2017   Procedure: ESOPHAGOGASTRODUODENOSCOPY (EGD);  Surgeon: Pasty Spillers, MD;  Location: [facility] ENDOSCOPY;  Service: Endoscopy;  Laterality: N/A;   ESOPHAGOGASTRODUODENOSCOPY (EGD) WITH PROPOFOL N/A 12/24/2014   Procedure: ESOPHAGOGASTRODUODENOSCOPY (EGD) WITH PROPOFOL;  Surgeon: Wallace Cullens, MD;  Location: [facility]  ENDOSCOPY;  Service: Gastroenterology;  Laterality: N/A;   JOINT REPLACEMENT     LOWER EXTREMITY ANGIOGRAPHY Left 07/17/2019   Procedure: Lower Extremity Angiography;  Surgeon: Annice Needy, MD;  Location: [facility] INVASIVE CV LAB;  Service: Cardiovascular;  Laterality: Left;   ROUX-EN-Y GASTRIC BYPASS     loss over 100 pounds   TONSILLECTOMY     TOTAL KNEE  ARTHROPLASTY Left    HPI:  [name] is an 72 y.o. 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 Her caregiver had come to check on the patient after attempting to contact her by phone with no answer. She was found on the floor with AMS present; a strong odor of urine and feces was noted. She was unable to get up off the ground with caretaker assistance and EMS was called. It was estimated that the patient had possibly been down for about 10-15 hrs.  12/04/22, CT head was obtained, revealing a subacute infarct in the right temporal lobe, extending toward the right temporal occipital region, with areas of hemorrhage, most likely hemorrhagic transformation. This was associated with local mass effect and 3 mm of right to left midline shift. Also noted was a remote infarct in the left parietal, occipital, and temporal lobes, with resolution of previously noted hyperdense hemorrhage; ST consulted for clinical swallowing evaluation/speech/language assessment.   Assessment / Plan / Recommendation Clinical Impression  Pt participated in limited speech/language evaluation with pt responding in primarily 1-word or short phrases such as "I'm fine" during interactions d/t impaired cognition, decreased awareness, and requirement for mod multimodal cueing throughout assessment paired with decreased alertness level.  Portions of the [employer] Mental Status Examination (SLUMS) unsucessful with pt refusing and/or only vaguely attempting tasks such as naming, orientation questions, etc. Pt oriented to self only, but not to situation,  place and/or date.  Pt followed 1-2 step commands with mod verbal/tactile cues, but was unable to follow multi-step commands.  Gaze preference to R noted, but reading/graphic expression unable to be assessed fully d/t decreased participation/alertness level.  She anwered personal information questions inconsistently (50% accurate) and was unable to answer simple problem solving questions despite cueing from SLP.  ST will f/u in acute setting for above mentioned cognitive/linguistic deficits and continued assessment of overall cognition.    SLP Assessment  SLP Recommendation/Assessment: Patient needs continued Speech Language Pathology Services SLP Visit Diagnosis: Attention and concentration deficit;Cognitive communication deficit (R41.841) Attention and concentration deficit following: Cerebral infarction    Recommendations for follow up therapy are one component of a multi-disciplinary discharge planning process, led by the attending physician.  Recommendations may be updated based on patient status, additional functional criteria and insurance authorization.    Follow Up Recommendations  Other (comment) (TBD)    Assistance Recommended at Discharge  Frequent or constant Supervision/Assistance  Functional Status Assessment Patient has had a recent decline in their functional status and demonstrates the ability to make significant improvements in function in a reasonable and predictable amount of time.  Frequency and Duration min 2x/week  1 week      SLP Evaluation Cognition  Overall Cognitive Status: Impaired/Different from baseline Arousal/Alertness:  Lethargic Orientation Level: Oriented to person;Disoriented to place;Disoriented to time;Disoriented to situation Attention: Sustained Sustained Attention: Impaired Sustained Attention Impairment: Verbal basic;Functional basic Memory: Impaired Memory Impairment: Other (comment) (repetition of phrases, required mod cues to maintain  alertness/follow simple commands) Awareness: Impaired Awareness Impairment: Anticipatory impairment;Emergent impairment;Intellectual impairment Problem Solving: Impaired Problem Solving Impairment: Verbal basic;Functional basic Behaviors: Perseveration;Verbal agitation Safety/Judgment: Other (comment) (DTA)       Comprehension  Auditory Comprehension Overall Auditory Comprehension: Impaired Commands: Impaired Multistep Basic Commands: 0-24% accurate Conversation: Simple Visual Recognition/Discrimination Discrimination: Not tested Reading Comprehension Reading Status: Not tested    Expression Expression Primary Mode of Expression: Verbal Verbal Expression Overall Verbal Expression: Other (comment) (DTA d/t limited verbalizations) Level of Generative/Spontaneous Verbalization: Phrase Pragmatics: Unable to assess Interfering Components: Attention;Premorbid deficit Non-Verbal Means of Communication: Not applicable Written Expression Dominant Hand: Right Written Expression: Not tested   Oral / Motor  Oral Motor/Sensory Function Overall Oral Motor/Sensory Function: Generalized oral weakness (DTA) Motor Speech Overall Motor Speech: Appears within functional limits for tasks assessed Respiration: Within functional limits Phonation: Low vocal intensity Resonance: Within functional limits Articulation: Within functional limitis Intelligibility: Intelligible Motor Planning: Witnin functional limits Motor Speech Errors: Not applicable            Pat Kalynne Womac,M.S., CCC-SLP 12/05/2022, 1:04 PM

## 2022-12-05 NOTE — Evaluation (Signed)
Clinical/Bedside Swallow Evaluation Patient Details  Name: [name] MRN: [id number] Date of Birth: 12/04/1950  Today's Date: 12/05/2022 Time: SLP Start Time (ACUTE ONLY): 0940 SLP Stop Time (ACUTE ONLY): 1010 SLP Time Calculation (min) (ACUTE ONLY): 30 min  Past Medical History:  Past Medical History:  Diagnosis Date   Acute CVA (cerebrovascular accident) (HCC) 12/18/2021   Acute gastric ulcer without hemorrhage or perforation    Acute on chronic systolic CHF (congestive heart failure), NYHA class 3 (HCC) 08/03/2019   Formatting of this note might be different from the original. Global ef 35%   Anemia    Anemia, iron deficiency 11/07/2013   Arthritis    CAD (coronary artery disease)    Cerebrovascular accident [facility])    CHF (congestive heart failure) (HCC)    Chicken pox    Chronic kidney disease    Colitis    ? 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   Sleep apnea    Type 2 diabetes mellitus with diabetic peripheral angiopathy without gangrene, without long-term current use of insulin (HCC)    Past Surgical History:  Past Surgical History:  Procedure Laterality Date   ABDOMINAL HYSTERECTOMY     CAROTID ANGIOGRAPHY Left 07/21/2022   Procedure: CAROTID ANGIOGRAPHY;  Surgeon: Renford Dills, MD;  Location: [facility] INVASIVE CV LAB;  Service: Cardiovascular;  Laterality: Left;   CHOLECYSTECTOMY     COLONOSCOPY WITH PROPOFOL N/A 12/24/2014   Procedure: COLONOSCOPY WITH PROPOFOL;  Surgeon: Wallace Cullens,  MD;  Location: [facility] ENDOSCOPY;  Service: Gastroenterology;  Laterality: N/A;   CORONARY ARTERY BYPASS GRAFT     ESOPHAGOGASTRODUODENOSCOPY N/A 09/02/2017   Procedure: ESOPHAGOGASTRODUODENOSCOPY (EGD);  Surgeon: Pasty Spillers, MD;  Location: [facility] ENDOSCOPY;  Service: Endoscopy;  Laterality: N/A;   ESOPHAGOGASTRODUODENOSCOPY (EGD) WITH PROPOFOL N/A 12/24/2014   Procedure: ESOPHAGOGASTRODUODENOSCOPY (EGD) WITH PROPOFOL;  Surgeon: Wallace Cullens, MD;  Location: [facility] ENDOSCOPY;  Service: Gastroenterology;  Laterality: N/A;   JOINT REPLACEMENT     LOWER EXTREMITY ANGIOGRAPHY Left 07/17/2019   Procedure: Lower Extremity Angiography;  Surgeon: Annice Needy, MD;  Location: [facility] INVASIVE CV LAB;  Service: Cardiovascular;  Laterality: Left;   ROUX-EN-Y GASTRIC BYPASS     loss over 100 pounds   TONSILLECTOMY     TOTAL KNEE ARTHROPLASTY Left    HPI:  [name] is an 72 y.o. 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 Her caregiver had come to check on the patient after attempting to contact her by phone with no answer. She was found on the floor with AMS present; a strong odor of  urine and feces was noted. She was unable to get up off the ground with caretaker assistance and EMS was called. It was estimated that the patient had possibly been down for about 10-15 hrs.  12/04/22, CT head was obtained, revealing a subacute infarct in the right temporal lobe, extending toward the right temporal occipital region, with areas of hemorrhage, most likely hemorrhagic transformation. This was associated with local mass effect and 3 mm of right to  left midline shift. Also noted was a remote infarct in the left parietal, occipital, and temporal lobes, with resolution of previously noted hyperdense hemorrhage; ST consulted for clinical swallowing evaluation/speech/language assessment.    Assessment / Plan / Recommendation  Clinical Impression  Pt seen for clinical swallowing evaluation with cognitive-based dysphagia noted including oral holding, prolonged mastication d/t limited dentition (munching pattern) and a delay in the initiation of the swallow. Pt required mod verbal/tactile cues throughout assessment to maintain alertness level, follow commands and interact with SLP overall.  Pt consumed limited amounts of thin via straw, puree and soft solids during evaluation without overt s/sx of aspiration noted, but pt is at risk d/t decreased alertness level and impaired cognition.  Recommend initiate a Dysphagia 2(minced)/thin liquid diet with FULL precautions and FULL A with meals to implement swallowing precautions.  Swallowing precaution sign posted in room and nursing educated re: need for medication either crushed or whole in puree for safety purposes.  ST will f/u in acute setting for dysphagia tx/management.  Thank you for this consult. SLP Visit Diagnosis: Dysphagia, oropharyngeal phase (R13.12)    Aspiration Risk  Mild aspiration risk    Diet Recommendation   Thin;Dysphagia 2 (chopped)  Medication Administration: Crushed with puree (or whole in puree)    Other  Recommendations Oral Care Recommendations: Oral care BID;Staff/trained caregiver to provide oral care    Recommendations for follow up therapy are one component of a multi-disciplinary discharge planning process, led by the attending physician.  Recommendations may be updated based on patient status, additional functional criteria and insurance authorization.  Follow up Recommendations Other (comment) (TBD)      Assistance Recommended at Discharge    Functional Status  Assessment Patient has had a recent decline in their functional status and demonstrates the ability to make significant improvements in function in a reasonable and predictable amount of time.  Frequency and Duration min 2x/week  1 week       Prognosis Prognosis for improved oropharyngeal function: Good Barriers to Reach Goals: Cognitive deficits      Swallow Study   General Date of Onset: 12/04/22 HPI: [name] is an 72 y.o. 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 Her caregiver had come to check on the patient after attempting to contact her by phone with no answer. She was found on the floor with AMS present; a strong odor of urine and feces was noted. She was unable to get up off the ground with caretaker assistance and EMS was called. It was estimated that the patient had possibly been down for about 10-15 hrs.  12/04/22, CT head was obtained, revealing a subacute infarct in the right temporal  lobe, extending toward the right temporal occipital region, with areas of hemorrhage, most likely hemorrhagic transformation. This was associated with local mass effect and 3 mm of right to left midline shift. Also noted was a remote infarct in the left parietal, occipital, and temporal lobes, with resolution of previously noted hyperdense hemorrhage; ST consulted for clinical swallowing evaluation/speech/language assessment. Type of Study: Bedside Swallow Evaluation Previous Swallow Assessment: n/a Diet Prior to this Study: NPO Temperature Spikes Noted: No Respiratory Status: Room air History of Recent Intubation:  No Behavior/Cognition: Confused;Distractible;Lethargic/Drowsy;Requires cueing Oral Cavity Assessment: Dry Oral Care Completed by SLP: Recent completion by staff Oral Cavity - Dentition: Missing dentition;Poor condition Vision: Impaired for self-feeding Self-Feeding Abilities: Total assist Patient Positioning: Upright in bed Baseline Vocal Quality: Low vocal intensity Volitional Cough: Cognitively unable to elicit Volitional Swallow: Unable to elicit    Oral/Motor/Sensory Function Overall Oral Motor/Sensory Function: Generalized oral weakness (DTA)   Ice Chips Ice chips: Not tested   Thin Liquid Thin Liquid: Impaired Presentation: Straw Oral Phase Functional Implications: Oral holding Pharyngeal  Phase Impairments: Suspected delayed Swallow    Nectar Thick Nectar Thick Liquid: Not tested   Honey Thick Honey Thick Liquid: Not tested   Puree Puree: Impaired Presentation: Spoon Oral Phase Functional Implications: Oral holding Pharyngeal Phase Impairments: Suspected delayed Swallow   Solid     Solid: Impaired Presentation: Spoon Oral Phase Impairments: Impaired mastication Oral Phase Functional Implications: Impaired mastication;Oral holding Pharyngeal Phase Impairments: Suspected delayed Swallow      Pat Akiba Melfi,M.S., CCC-SLP 12/05/2022,12:50 PM

## 2022-12-06 ENCOUNTER — Inpatient Hospital Stay (HOSPITAL_COMMUNITY): Payer: Medicare Other

## 2022-12-06 DIAGNOSIS — I351 Nonrheumatic aortic (valve) insufficiency: Secondary | ICD-10-CM | POA: Diagnosis not present

## 2022-12-06 DIAGNOSIS — I2510 Atherosclerotic heart disease of native coronary artery without angina pectoris: Secondary | ICD-10-CM

## 2022-12-06 LAB — ECHOCARDIOGRAM COMPLETE
Area-P 1/2: 2.72 cm2
Calc EF: 45.8 %
S' Lateral: 3.4 cm
Single Plane A2C EF: 47.8 %
Single Plane A4C EF: 42.6 %

## 2022-12-06 LAB — GLUCOSE, CAPILLARY
Glucose-Capillary: 92 mg/dL (ref 70–99)
Glucose-Capillary: 108 mg/dL — ABNORMAL HIGH (ref 70–99)
Glucose-Capillary: 120 mg/dL — ABNORMAL HIGH (ref 70–99)
Glucose-Capillary: 144 mg/dL — ABNORMAL HIGH (ref 70–99)

## 2022-12-06 NOTE — Progress Notes (Addendum)
STROKE TEAM PROGRESS NOTE   BRIEF HPI [name] is a 72 y.o. 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 Her caregiver had come to check on the patient after attempting to contact her by phone with no answer .  SIGNIFICANT HOSPITAL EVENTS 3/18- discharged from [facility] 3/22- ED [facility] as code stroke and sent to [facility] 6/28- admit to [facility], transferred from [facility] 6/30- transfer out of ICU 6/29-MRI brain large evolving right MCA infarct with hemorrhagic transformation.  Mild cytotoxic edema with 4 mm right to left shift. 6/29-MR angiogram brain shows right M3 occlusion and chronic distal left P3 stenosis/occlusion.  Chronic left ICA occlusion.  Moderate to severe right A1 and left A2 stenosis  INTERM HISTORY/SUBJECTIVE Awakens to voice today and follows commands well. Left side is still weaker than right side. Will try to obtain 30 day monitor results tomorrow and based off of that she may need either a loop prior to discharge or another 30 day monitor.  Plan to start ASA 81mg  tomorrow. Did pass speech eval and does have a diet. Transfer out of ICU today  OBJECTIVE  CBC    Component Value Date/Time   WBC 6.4 12/04/2022 1753   RBC 4.51 12/04/2022 1753   HGB 12.1 12/04/2022 1753   HGB 8.3 (L) 02/05/2012 0833   HCT 40.6 12/04/2022 1753   HCT 26.8 (L) 01/25/2012 0837   PLT 271 12/04/2022 1753   PLT 227 02/03/2012 0426   MCV 90.0 12/04/2022 1753   MCV 70 (L) 01/25/2012 0837   MCH 26.8 12/04/2022 1753   MCHC 29.8 (L) 12/04/2022 1753   RDW 17.3 (H) 12/04/2022 1753   RDW 17.8 (H) 01/25/2012 0837    LYMPHSABS 1.6 08/28/2022 1637   MONOABS 1.0 08/28/2022 1637   EOSABS 0.1 08/28/2022 1637   BASOSABS 0.0 08/28/2022 1637    BMET    Component Value Date/Time   NA 141 12/04/2022 1753   NA 144 02/03/2012 0426   K 4.8 12/04/2022 1753   K 4.4 02/03/2012 0426   CL 111 12/04/2022 1753   CL 113 (H) 02/03/2012 0426   CO2 18 (L) 12/04/2022 1753   CO2 24 02/03/2012 0426   GLUCOSE 130 (H) 12/04/2022 1753   GLUCOSE 153 (H) 02/03/2012 0426   BUN 29 (H) 12/04/2022 1753   BUN 22 (H) 02/03/2012 0426   CREATININE 1.60 (H) 12/04/2022 1753   CREATININE 1.76 (H) 08/18/2013 1019   CALCIUM 8.9 12/04/2022 1753   CALCIUM 8.1 (L) 02/03/2012 0426   GFRNONAA 34 (L) 12/04/2022 1753   GFRNONAA 30 (L) 08/18/2013 1019    IMAGING past 24 hours MR BRAIN WO CONTRAST  Result Date: 12/05/2022 CLINICAL DATA:  Follow-up examination for stroke. EXAM: MRI HEAD WITHOUT CONTRAST MRA HEAD WITHOUT CONTRAST TECHNIQUE: Multiplanar, multi-echo pulse sequences of the brain and surrounding structures were acquired without intravenous contrast. Angiographic images of the Circle of Willis were acquired using MRA technique without intravenous contrast. COMPARISON:  Prior study from 12/04/2022 and earlier. FINDINGS: MRI HEAD FINDINGS Brain: Mild age-related cerebral atrophy with chronic small vessel ischemic disease. Small remote right cerebellar infarct. Evolving late subacute to chronic  infarct involving the left temporal occipital region. Areas of intrinsic T1 hyperintensity likely reflect evolving laminar necrosis. No visible hemorrhage on prior CT. No significant mass effect. Additional large evolving acute to early subacute right MCA territory infarct involving the right temporal occipital region, with involvement of the right insula/external capsule. Superimpose susceptibility artifact consistent with hemorrhagic transformation, corresponding with abnormality on prior CT. Localized edema and regional mass effect, with 4 mm of  right-to-left shift. Mild ex vacuo dilatation of the left lateral ventricle related to the chronic left cerebral encephalomalacia. No hydrocephalus or trapping. Basilar cisterns remain patent. No other evidence for acute or subacute ischemia. No other acute or chronic intracranial blood products. No mass lesion or extra-axial fluid collection. Pituitary gland suprasellar region within normal limits. Vascular: Loss of normal flow void within the left ICA to the terminus, consistent with previously identified occlusion. Major intracranial vascular flow voids are otherwise maintained. Skull and upper cervical spine: Craniocervical junction within normal limits. Bone marrow signal intensity normal. No scalp soft tissue abnormality. Sinuses/Orbits: Globes orbital soft tissues within normal limits. Paranasal sinuses are largely clear. No significant mastoid effusion. Other: None. MRA HEAD FINDINGS Anterior circulation: Left ICA remains left A1 segment patent. Focal moderate to severe right A1 stenosis (series 853, image 14). Normal anterior communicating complex. Right ACA widely patent. Severe left A2 stenosis noted (series 8, image 123). Right M1 segment widely patent. There is a distal right M3 occlusion, in keeping with the evolving right MCA territory infarct (series 8, image 139). Remainder of the right MCA branches remain patent. Left M1 segment. Previously seen left M2 occlusion is not definitely seen on this exam. Distal left MCA branches are attenuated but patent. Posterior circulation: Both V4 segments patent without stenosis. Left PICA patent. Right PICA origin not seen. Mild atheromatous irregularity about the basilar without high-grade stenosis. Superior cerebral arteries patent bilaterally. Left PCA supplied via the basilar. Right PCA supplied via a hypoplastic right P1 segment and robust right posterior communicating. Right PCA widely patent to its distal aspect. Chronic distal left P3 occlusion (series  856, image 12). Additional severe left P3 stenosis (series 856, image 16). Anatomic variants: As above.  No aneurysm. IMPRESSION: MRI HEAD IMPRESSION: 1. Large evolving acute to early subacute right MCA territory infarct with hemorrhagic transformation. Localized edema and regional mass effect, with 4 mm of right-to-left shift. 2. Evolving late subacute to chronic infarct involving the left temporal occipital region. 3. Underlying mild age-related cerebral atrophy with chronic small vessel ischemic disease, with a small remote right cerebellar infarct. MRA HEAD IMPRESSION: 1. Acute right M3 occlusion, in keeping with the evolving right MCA territory infarct. 2. 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 Previously noted left M2 occlusion is not seen on this exam, presumably recanalized in the interim. 3. 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 4. Moderate to severe right A1 and left A2 stenoses, stable. Electronically Signed   By: Rise Mu M.D.   On: 12/05/2022 20:05   MR ANGIO HEAD WO CONTRAST  Result Date: 12/05/2022 CLINICAL DATA:  Follow-up examination for stroke. EXAM: MRI HEAD WITHOUT CONTRAST MRA HEAD WITHOUT CONTRAST TECHNIQUE: Multiplanar, multi-echo pulse sequences of the brain and surrounding structures were acquired without intravenous contrast. Angiographic images of the Circle of Willis were acquired using MRA technique without intravenous contrast. COMPARISON:  Prior study from 12/04/2022 and earlier. FINDINGS: MRI HEAD FINDINGS Brain: Mild age-related cerebral atrophy with chronic small vessel ischemic disease. Small remote right cerebellar infarct. Evolving  late subacute to chronic infarct involving the left temporal occipital region. Areas of intrinsic T1 hyperintensity likely reflect evolving laminar necrosis. No visible hemorrhage on prior CT. No significant mass effect. Additional large evolving acute to early subacute right MCA territory infarct involving the  right temporal occipital region, with involvement of the right insula/external capsule. Superimpose susceptibility artifact consistent with hemorrhagic transformation, corresponding with abnormality on prior CT. Localized edema and regional mass effect, with 4 mm of right-to-left shift. Mild ex vacuo dilatation of the left lateral ventricle related to the chronic left cerebral encephalomalacia. No hydrocephalus or trapping. Basilar cisterns remain patent. No other evidence for acute or subacute ischemia. No other acute or chronic intracranial blood products. No mass lesion or extra-axial fluid collection. Pituitary gland suprasellar region within normal limits. Vascular: Loss of normal flow void within the left ICA to the terminus, consistent with previously identified occlusion. Major intracranial vascular flow voids are otherwise maintained. Skull and upper cervical spine: Craniocervical junction within normal limits. Bone marrow signal intensity normal. No scalp soft tissue abnormality. Sinuses/Orbits: Globes orbital soft tissues within normal limits. Paranasal sinuses are largely clear. No significant mastoid effusion. Other: None. MRA HEAD FINDINGS Anterior circulation: Left ICA remains left A1 segment patent. Focal moderate to severe right A1 stenosis (series 853, image 14). Normal anterior communicating complex. Right ACA widely patent. Severe left A2 stenosis noted (series 8, image 123). Right M1 segment widely patent. There is a distal right M3 occlusion, in keeping with the evolving right MCA territory infarct (series 8, image 139). Remainder of the right MCA branches remain patent. Left M1 segment. Previously seen left M2 occlusion is not definitely seen on this exam. Distal left MCA branches are attenuated but patent. Posterior circulation: Both V4 segments patent without stenosis. Left PICA patent. Right PICA origin not seen. Mild atheromatous irregularity about the basilar without high-grade stenosis.  Superior cerebral arteries patent bilaterally. Left PCA supplied via the basilar. Right PCA supplied via a hypoplastic right P1 segment and robust right posterior communicating. Right PCA widely patent to its distal aspect. Chronic distal left P3 occlusion (series 856, image 12). Additional severe left P3 stenosis (series 856, image 16). Anatomic variants: As above.  No aneurysm. IMPRESSION: MRI HEAD IMPRESSION: 1. Large evolving acute to early subacute right MCA territory infarct with hemorrhagic transformation. Localized edema and regional mass effect, with 4 mm of right-to-left shift. 2. Evolving late subacute to chronic infarct involving the left temporal occipital region. 3. Underlying mild age-related cerebral atrophy with chronic small vessel ischemic disease, with a small remote right cerebellar infarct. MRA HEAD IMPRESSION: 1. Acute right M3 occlusion, in keeping with the evolving right MCA territory infarct. 2. 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 Previously noted left M2 occlusion is not seen on this exam, presumably recanalized in the interim. 3. 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 4. Moderate to severe right A1 and left A2 stenoses, stable. Electronically Signed   By: Rise Mu M.D.   On: 12/05/2022 20:05   DG Abd 1 View  Result Date: 12/05/2022 CLINICAL DATA:  Abnormal head CT EXAM: ABDOMEN - 1 VIEW COMPARISON:  CT 02/12/2019 FINDINGS: Surgical clips in the right upper quadrant. Nonobstructed gas pattern. Numerous leads over the central abdomen with presumed external device over left lower quadrant. Otherwise no unexpected metallic density foreign bodies are seen. IMPRESSION: Nonobstructed gas pattern. Electronically Signed   By: Jasmine Pang M.D.   On: 12/05/2022 18:31   DG CHEST PORT 1  VIEW  Result Date: 12/05/2022 CLINICAL DATA:  Abnormal brain CT EXAM: PORTABLE CHEST 1 VIEW COMPARISON:  04/07/2022 FINDINGS: Post sternotomy changes with chronic elevation  right diaphragm. No acute airspace disease, pleural effusion or pneumothorax. Aortic atherosclerosis. No unexpected metallic foreign bodies are seen. IMPRESSION: No active disease. Post sternotomy changes. Electronically Signed   By: Jasmine Pang M.D.   On: 12/05/2022 18:29    Vitals:   12/06/22 0500 12/06/22 0530 12/06/22 0600 12/06/22 0630  BP: (!) 151/63  (!) 150/80 (!) 144/59  Pulse: (!) 54 (!) 55  61  Resp: 13 15 12 18   Temp:      TempSrc:      SpO2: 99% 100%  96%     PHYSICAL EXAM General:  Alert, well-nourished, well-developed patient in no acute distress Psych:  Mood and affect appropriate for situation CV: Regular rate and rhythm on monitor Respiratory:  Regular, unlabored respirations on room air GI: Abdomen soft and nontender   NEURO:  Mental Status: Opens eyes to verbal stimuli. Follows commands bilaterally.  Speech/Language: Speech is without dysarthria  Cranial Nerves:  II: Pupils are equal and reactive, left field cut III, IV, VI: Preferential gaze to the right, does cross midline V: Sensation is intact to light touch and symmetrical to face.  VII: Face is symmetrical resting  VIII: hearing intact to voice. IX, X: Palate elevates symmetrically. Phonation is normal.  LK:[id number] shrug 5/5. XII: tongue is midline without fasciculations. Motor: Moves right upper extremity and right lower extremity more briskly than the left side. Tone: is normal and bulk is normal Sensation-reacts to noxious stimuli in all 4 extremities Coordination: Does not complete Gait- deferred   ASSESSMENT/PLAN  Acute Ischemic Infarct: Subacute infarct in the right temporal lobe with hemorrhagic transformation Etiology: Cardioembolic likely Code Stroke CT head - Subacute infarct in the right temporal lobe, extending toward the right temporal occipital region, with areas of hemorrhage, most likely hemorrhagic transformation. This is associated with local mass effect and 3 mm of right to  left midline shift. MRI-hemorrhagic right MCA infarct.  Mild cytotoxic edema.  4 mm right to left shift.  Subacute left PCA infarct. MRA-  2D Echo pending LDL 75 HgbA1c 7.7 VTE prophylaxis -SCDs aspirin 81 mg daily and clopidogrel 75 mg daily prior to admission, now on No antithrombotic due to hemorrhagic transformation Therapy recommendations: Pending evaluation Disposition: Pending  Hx of Stroke/TIA March 2024 - PCA infarction with large hemorrhagic transformation  Treated at [facility] long-term cardiac monitor, however we do not have results for it at this time. July 2023-acute infarct involving the cortical/subcortical right frontal lobe  Hypertension Home meds: Losartan, furosemide Cleviprex infusion As needed medications-labetalol, hydralazine Blood Pressure Goal: SBP between 130-150 for 24 hours and then less than 160   Hyperlipidemia Home meds: Atorvastatin 80 mg, resumed in hospital LDL 75, goal < 70 Continue statin at discharge  Diabetes type II Uncontrolled Home meds: None HgbA1c 7.7, goal < 7.0 CBGs SSI Recommend close follow-up with PCP for better DM control  Dysphagia Patient has post-stroke dysphagia, SLP consulted    Diet   DIET DYS 2 Room service appropriate? Yes; Fluid consistency: Thin   Advance diet as tolerated  Other Stroke Risk Factors Coronary artery disease S/p CAGB Congestive heart failure  Other Active Problems CKD Cr 1.6 GFR 34 Hashimoto's Synthroid 200 mg Monday Wednesday Friday  Hospital day # 2  Patient seen and examined by NP/APP with MD. MD to update note as needed.  Elmer Picker, DNP, FNP-BC [facility] Pager: [phone]  STROKE MD NOTE :  I have personally obtained history,examined this patient, reviewed notes, independently viewed imaging studies, participated in medical decision making and plan of care.ROS completed by me personally and pertinent positives fully documented  I have made any additions or  clarifications directly to the above note. Agree with note above.  She continues to have left hemiparesis and some slow speech and MRI confirms hemorrhagic right MCA infarct etiology indeterminate but likely cardioembolic given recent prior left PCA infarct as well.  Recommend follow results of 2 weeks of external cardiac monitoring done at [facility] and if it did not show A-fib then she will likely need prolonged cardiac monitoring at discharge.  Mobilize out of bed.  Physical occupational speech therapy consults.  Hold antiplatelet therapy due to hemorrhagic transformation from now.  Strict control of blood pressure with systolic goal below 160.This patient is critically ill and at significant risk of neurological worsening, death and care requires constant monitoring of vital signs, hemodynamics,respiratory and cardiac monitoring, extensive review of multiple databases, frequent neurological assessment, discussion with family, other specialists and medical decision making of high complexity.I have made any additions or clarifications directly to the above note.This critical care time does not reflect procedure time, or teaching time or supervisory time of PA/NP/Med Resident etc but could involve care discussion time.  I spent 30 minutes of neurocritical care time  in the care of  this patient.      Delia Heady, MD Medical Director [facility] Pager: [phone] 12/06/2022 2:35 PM  To contact Stroke Continuity provider, please refer to [email or web address]. After hours, contact General Neurology

## 2022-12-06 NOTE — Progress Notes (Signed)
Echocardiogram 2D Echocardiogram has been performed.  Warren Lacy Sheria Rosello RDCS 12/06/2022, 1:47 PM

## 2022-12-06 NOTE — Plan of Care (Signed)
Pt oriented x person only.  Follows commands in all extremities.  Left side weaker than right.  On RA.  VSS.  BP within ordered parameters.  Tolerating diet.  BM noted.  Voiding adequately.  Remains safe from injury.

## 2022-12-06 NOTE — Progress Notes (Signed)
Report given and pt transferred to room 3w38 at this time.  Pt has no s/s of any acute distress noted.

## 2022-12-06 NOTE — Progress Notes (Signed)
VASCULAR LAB    Carotid duplex has been performed.  See CV proc for preliminary results.   Aitan Rossbach, RVT 12/06/2022, 3:11 PM

## 2022-12-07 LAB — GLUCOSE, CAPILLARY
Glucose-Capillary: 122 mg/dL — ABNORMAL HIGH (ref 70–99)
Glucose-Capillary: 133 mg/dL — ABNORMAL HIGH (ref 70–99)
Glucose-Capillary: 134 mg/dL — ABNORMAL HIGH (ref 70–99)
Glucose-Capillary: 135 mg/dL — ABNORMAL HIGH (ref 70–99)
Glucose-Capillary: 211 mg/dL — ABNORMAL HIGH (ref 70–99)
Glucose-Capillary: 91 mg/dL (ref 70–99)

## 2022-12-07 MED ORDER — SODIUM CHLORIDE 0.9 % IV BOLUS: 250.0000 mL | Freq: Once | INTRAVENOUS | Status: DC

## 2022-12-07 MED ORDER — ASPIRIN 81 MG PO CHEW
81.0000 mg | CHEWABLE_TABLET | Freq: Every day | ORAL | Status: DC
  Administered 2022-12-07 – 2022-12-10 (×4): 81 mg via ORAL
  Filled 2022-12-07 (×4): qty 1

## 2022-12-07 NOTE — Care Management Important Message (Signed)
Important Message  Patient Details  Name: [name] MRN: [id number] Date of Birth: December 27, 1950   Medicare Important Message Given:  Yes     Dorena Bodo 12/07/2022, 2:28 PM

## 2022-12-07 NOTE — Progress Notes (Addendum)
   12/07/22 0536  Urine Characteristics  Bladder Scan Volume (mL) 412 mL  Intermittent/Straight Cath (mL) 380 mL  Intermittent Catheter Size 16  Post Void Cath Residual (mL) 26 mL   Second time In/Out cath done, values as shown above. Pt denies urge to void.

## 2022-12-07 NOTE — Progress Notes (Signed)
Speech Language Pathology Treatment: Dysphagia  Patient Details Name: [name] MRN: [id number] DOB: [birth year] Today's Date: 12/07/2022 Time: 4098-1191 SLP Time Calculation (min) (ACUTE ONLY): 14 min  Assessment / Plan / Recommendation Clinical Impression  Pt seen for dysphagia tx with eyes closed upon SLP arrival with min verbal cueing to rouse for po trial.  Limited amount of consistencies assessed d/t pt refusal/decreased alertness level, but no overt s/sx of aspiration observed with any consistency including thin via straw, puree and soft solids (peaches).  Pt exhibited a munching masticatory pattern with soft solids, but she does have limited, poor dentition anteriorly.  She responded in short phrases and was oriented to self only during session.  Prolonged mastication efforts and slow bolus propulsion observed, but pt's vocal quality remained adequate throughout trial and she stated "tastes good" at the end of the session.  Recommend continuing current diet for safety measures d/t pt's impaired cognition, arousal level and limited dentition.  ST will f/u for potential progression of diet/dysphagia tx/education during acute stay.     HPI HPI: [name] is an 72 y.o. 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 Her caregiver had come to check on the patient after attempting to contact her by phone with no answer. She was found on the floor with AMS present; a strong odor of urine and feces was noted. She was unable to get up off the ground with caretaker assistance and EMS was called. It was estimated that the  patient had possibly been down for about 10-15 hrs. 12/04/22, CT head was obtained, revealing a subacute infarct in the right temporal lobe, extending toward the right temporal occipital region, with areas of hemorrhage, most likely hemorrhagic transformation. This was associated with local mass effect and 3 mm of right to left midline shift. Also noted was a remote infarct in the left parietal, occipital, and temporal lobes, with resolution of previously noted hyperdense hemorrhage; ST consulted for clinical swallowing evaluation/speech/language assessment; placed on Dysphagia 2/thin liquid diet; ST f/u for diet tolerance/dysphagia tx.      SLP Plan  Continue with current plan of care      Recommendations for follow up therapy are one component of a multi-disciplinary discharge planning process, led by the attending physician.  Recommendations may be updated based on patient status, additional functional criteria and insurance authorization.    Recommendations  Diet recommendations: Dysphagia 2 (fine chop);Thin liquid Liquids provided via: Cup;Straw Medication Administration: Crushed with puree (or whole depending on mentation) Supervision: Staff to assist with self feeding;Intermittent supervision to cue for compensatory strategies Compensations: Slow rate;Minimize environmental distractions;Small sips/bites Postural Changes and/or Swallow Maneuvers: Seated upright 90 degrees                Other(comment) (TBD) Oral care BID;Staff/trained caregiver to provide oral care   Frequent or constant Supervision/Assistance Dysphagia, unspecified (R13.10) Cerebral infarction   Continue with current plan of care     [name],M.S., CCC-SLP  12/07/2022, 10:47 AM

## 2022-12-07 NOTE — Progress Notes (Addendum)
STROKE TEAM PROGRESS NOTE   BRIEF HPI [name] is a 72 y.o. 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 Her caregiver had come to check on the patient after attempting to contact her by phone with no answer .  SIGNIFICANT HOSPITAL EVENTS 3/18- discharged from [facility] 3/22- ED [facility] as code stroke and sent to [facility] 6/28- admit to [facility], transferred from [facility] 6/30- transfer out of ICU 6/29-MRI brain large evolving right MCA infarct with hemorrhagic transformation.  Mild cytotoxic edema with 4 mm right to left shift. 6/29-MR angiogram brain shows right M3 occlusion and chronic distal left P3 stenosis/occlusion.  Chronic left ICA occlusion.  Moderate to severe right A1 and left A2 stenosis  INTERIM HISTORY/SUBJECTIVE Patient is drowsy but arouses to voice and touch.  She remains hemodynamically stable.  Plan to start aspirin 81 mg daily today.  OBJECTIVE  CBC    Component Value Date/Time   WBC 6.4 12/04/2022 1753   RBC 4.51 12/04/2022 1753   HGB 12.1 12/04/2022 1753   HGB 8.3 (L) 02/05/2012 0833   HCT 40.6 12/04/2022 1753   HCT 26.8 (L) 01/25/2012 0837   PLT 271 12/04/2022 1753   PLT 227 02/03/2012 0426   MCV 90.0 12/04/2022 1753   MCV 70 (L) 01/25/2012 0837   MCH 26.8 12/04/2022 1753   MCHC 29.8 (L) 12/04/2022 1753   RDW 17.3 (H) 12/04/2022 1753   RDW 17.8 (H) 01/25/2012 0837   LYMPHSABS 1.6 08/28/2022 1637   MONOABS 1.0 08/28/2022 1637   EOSABS 0.1 08/28/2022 1637   BASOSABS 0.0 08/28/2022 1637    BMET    Component Value Date/Time   NA 141 12/04/2022 1753   NA 144 02/03/2012  0426   K 4.8 12/04/2022 1753   K 4.4 02/03/2012 0426   CL 111 12/04/2022 1753   CL 113 (H) 02/03/2012 0426   CO2 18 (L) 12/04/2022 1753   CO2 24 02/03/2012 0426   GLUCOSE 130 (H) 12/04/2022 1753   GLUCOSE 153 (H) 02/03/2012 0426   BUN 29 (H) 12/04/2022 1753   BUN 22 (H) 02/03/2012 0426   CREATININE 1.60 (H) 12/04/2022 1753   CREATININE 1.76 (H) 08/18/2013 1019   CALCIUM 8.9 12/04/2022 1753   CALCIUM 8.1 (L) 02/03/2012 0426   GFRNONAA 34 (L) 12/04/2022 1753   GFRNONAA 30 (L) 08/18/2013 1019    IMAGING past 24 hours VAS US CAROTID  Result Date: 12/06/2022 Carotid Arterial Duplex Study Patient Name:  [name]  Date of Exam:   12/06/2022 Medical Rec #: [id number]       Accession #:    [id number] Date of Birth: 1950/08/09       Patient Gender: F Patient Age:   1 years Exam Location:  [facility] Procedure:      VAS US CAROTID Referring Phys: Elmer Picker --------------------------------------------------------------------------------  Indications:       CVA. Risk Factors:      Hypertension, Diabetes, coronary artery disease, PAD. Other Factors:     Heart failure. Limitations        Today's exam was limited due to heavy calcification and the  resulting shadowing. Comparison Study:  Prior carotid duplex done at [facility] done 08/18/22 Performing Technologist: Sherren Kerns RVS  Examination Guidelines: A complete evaluation includes B-mode imaging, spectral Doppler, color Doppler, and power Doppler as needed of all accessible portions of each vessel. Bilateral testing is considered an integral part of a complete examination. Limited examinations for reoccurring indications may be performed as noted.  Right Carotid Findings: +----------+--------+--------+--------+----------------------+--------+           PSV cm/sEDV cm/sStenosisPlaque Description    Comments +----------+--------+--------+--------+----------------------+--------+ CCA Prox  50      13                                              +----------+--------+--------+--------+----------------------+--------+ CCA Distal59      17                                             +----------+--------+--------+--------+----------------------+--------+ ICA Prox  81      24      1-39%   calcific and irregular         +----------+--------+--------+--------+----------------------+--------+ ICA Mid   112     42                                             +----------+--------+--------+--------+----------------------+--------+ ICA Distal79      23                                             +----------+--------+--------+--------+----------------------+--------+ ECA       140     6                                              +----------+--------+--------+--------+----------------------+--------+ +----------+--------+-------+--------+-------------------+           PSV cm/sEDV cmsDescribeArm Pressure (mmHG) +----------+--------+-------+--------+-------------------+ Subclavian102                                        +----------+--------+-------+--------+-------------------+ +---------+--------+--+--------+--+ VertebralPSV cm/s50EDV cm/s10 +---------+--------+--+--------+--+  Left Carotid Findings: +----------+--------+--------+--------+------------------+-------------------+           PSV cm/sEDV cm/sStenosisPlaque DescriptionComments            +----------+--------+--------+--------+------------------+-------------------+ CCA Prox  72      0                                                     +----------+--------+--------+--------+------------------+-------------------+ CCA Distal69      3                                 intimal thickening  +----------+--------+--------+--------+------------------+-------------------+ ICA Prox  29  1               calcific          "thumping waveform"  +----------+--------+--------+--------+------------------+-------------------+ ICA Mid   55      1                                 "thumping waveform" +----------+--------+--------+--------+------------------+-------------------+ ICA Distal80      3                                 "thumping waveform" +----------+--------+--------+--------+------------------+-------------------+ ECA       121     3                                                     +----------+--------+--------+--------+------------------+-------------------+ +----------+--------+--------+--------+-------------------+           PSV cm/sEDV cm/sDescribeArm Pressure (mmHG) +----------+--------+--------+--------+-------------------+ [id number]                                          +----------+--------+--------+--------+-------------------+ +---------+--------+--+--------+--+ VertebralPSV cm/s53EDV cm/s15 +---------+--------+--+--------+--+   Summary: Right Carotid: Velocities in the right ICA are consistent with a 1-39% stenosis. Left Carotid: "Thumping" waveforms and severely diminished diastolic waveforms               throughout the ICA are indicative of a more proximal, complete to               nearly complete, occlusion. Vertebrals:  Bilateral vertebral arteries demonstrate antegrade flow. Subclavians: Normal flow hemodynamics were seen in bilateral subclavian              arteries. *See table(s) above for measurements and observations.     Preliminary    ECHOCARDIOGRAM COMPLETE  Result Date: 12/06/2022    ECHOCARDIOGRAM REPORT   Patient Name:   JAIDALYN HENDLER Date of Exam: 12/06/2022 Medical Rec #:  [id number]      Height:       66.0 in Accession #:    [id number]     Weight:       169.0 lb Date of Birth:  [birth year]      BSA:          1.862 m Patient Age:    71 years       BP:           150/72 mmHg Patient Gender: F              HR:           56 bpm. Exam Location:  Inpatient Procedure: 2D Echo,  Color Doppler and Cardiac Doppler Indications:    Stroke i63.9  History:        Patient has prior history of Echocardiogram examinations, most                 recent 08/20/2022. Prior CABG; Risk Factors:Hypertension,                 Diabetes and Dyslipidemia.  Sonographer:    Irving Burton Senior RDCS Referring Phys: [id number] DEVON SHAFER  Sonographer Comments: Scanned upright due to aspiration precautions.  IMPRESSIONS  1. Left ventricular ejection fraction, by estimation, is 45 to 50%. The left ventricle has mildly decreased function. The left ventricle demonstrates regional wall motion abnormalities (see scoring diagram/findings for description). There is severe asymmetric left ventricular hypertrophy of the septal segment. Left ventricular diastolic parameters are consistent with Grade I diastolic dysfunction (impaired relaxation).  2. Right ventricular systolic function is normal. The right ventricular size is normal. Tricuspid regurgitation signal is inadequate for assessing PA pressure.  3. Left atrial size was mildly dilated.  4. Right atrial size was mildly dilated.  5. The mitral valve is grossly normal. Mild mitral valve regurgitation. No evidence of mitral stenosis.  6. The aortic valve has an indeterminant number of cusps. Aortic valve regurgitation is trivial. No aortic stenosis is present.  7. Aortic dilatation noted. There is mild dilatation of the ascending aorta, measuring 40 mm.  8. The inferior vena cava is normal in size with greater than 50% respiratory variability, suggesting right atrial pressure of 3 mmHg. Comparison(s): Changes from prior study are noted. LVEF improved from 35-40% in 08/2022 to 45-50% now. FINDINGS  Left Ventricle: Left ventricular ejection fraction, by estimation, is 45 to 50%. The left ventricle has mildly decreased function. The left ventricle demonstrates regional wall motion abnormalities. The left ventricular internal cavity size was normal in size. There is severe asymmetric  left ventricular hypertrophy of the septal segment. Left ventricular diastolic parameters are consistent with Grade I diastolic dysfunction (impaired relaxation).  LV Wall Scoring: The entire inferior wall, posterior wall, and apex are hypokinetic. Right Ventricle: The right ventricular size is normal. No increase in right ventricular wall thickness. Right ventricular systolic function is normal. Tricuspid regurgitation signal is inadequate for assessing PA pressure. Left Atrium: Left atrial size was mildly dilated. Right Atrium: Right atrial size was mildly dilated. Pericardium: There is no evidence of pericardial effusion. Mitral Valve: The mitral valve is grossly normal. Mild mitral valve regurgitation. No evidence of mitral valve stenosis. Tricuspid Valve: The tricuspid valve is normal in structure. Tricuspid valve regurgitation is trivial. No evidence of tricuspid stenosis. Aortic Valve: The aortic valve has an indeterminant number of cusps. Aortic valve regurgitation is trivial. No aortic stenosis is present. Pulmonic Valve: The pulmonic valve was normal in structure. Pulmonic valve regurgitation is trivial. No evidence of pulmonic stenosis. Aorta: The aortic root is normal in size and structure and aortic dilatation noted. There is mild dilatation of the ascending aorta, measuring 40 mm. Venous: The inferior vena cava is normal in size with greater than 50% respiratory variability, suggesting right atrial pressure of 3 mmHg. IAS/Shunts: No atrial level shunt detected by color flow Doppler.  LEFT VENTRICLE PLAX 2D LVIDd:         4.60 cm      Diastology LVIDs:         3.40 cm      LV e' medial:    2.94 cm/s LV PW:         1.20 cm      LV E/e' medial:  19.0 LV IVS:        2.30 cm      LV e' lateral:   3.92 cm/s LVOT diam:     2.00 cm      LV E/e' lateral: 14.2 LV SV:         61 LV SV Index:   33 LVOT Area:     3.14 cm  LV Volumes (MOD) LV vol d, MOD A2C: 147.0 ml LV  vol d, MOD A4C: 125.0 ml LV vol s, MOD A2C:  76.7 ml LV vol s, MOD A4C: 71.7 ml LV SV MOD A2C:     70.3 ml LV SV MOD A4C:     125.0 ml LV SV MOD BP:      62.6 ml RIGHT VENTRICLE RV S prime:     11.30 cm/s LEFT ATRIUM             Index        RIGHT ATRIUM           Index LA diam:        4.60 cm 2.47 cm/m   RA Area:     22.60 cm LA Vol (A2C):   80.5 ml 43.24 ml/m  RA Volume:   68.20 ml  36.63 ml/m LA Vol (A4C):   62.6 ml 33.63 ml/m LA Biplane Vol: 74.3 ml 39.91 ml/m  AORTIC VALVE LVOT Vmax:   91.80 cm/s LVOT Vmean:  68.600 cm/s LVOT VTI:    0.193 m  AORTA Ao Root diam: 3.20 cm Ao Asc diam:  4.00 cm MITRAL VALVE MV Area (PHT): 2.72 cm     SHUNTS MV Decel Time: 279 msec     Systemic VTI:  0.19 m MV E velocity: 55.80 cm/s   Systemic Diam: 2.00 cm MV A velocity: 106.00 cm/s MV E/A ratio:  0.53 Vishnu Priya Mallipeddi Electronically signed by Winfield Rast Mallipeddi Signature Date/Time: 12/06/2022/2:54:25 PM    Final     Vitals:   12/06/22 2350 12/07/22 0322 12/07/22 0723 12/07/22 1128  BP: (!) 155/71 (!) 132/58 (!) 157/66 (!) (P) 140/71  Pulse: 63 (!) 47 (!) 50 (!) (P) 48  Resp: 18 18 14  (P) 14  Temp: 99.2 F (37.3 C) 97.6 F (36.4 C) 97.9 F (36.6 C) (P) 98 F (36.7 C)  TempSrc: Oral Oral Oral (P) Axillary  SpO2: 97% 98% 97% (P) 98%     PHYSICAL EXAM General: Drowsy, well-nourished, well-developed elderly patient in no acute distress Psych:  Mood and affect appropriate for situation CV: Regular rate and rhythm on monitor Respiratory:  Regular, unlabored respirations on room air GI: Abdomen soft and nontender   NEURO:  Mental Status: Opens eyes to verbal stimuli. Follows commands bilaterally.  Alert and oriented to self only Speech/Language: Speech is without dysarthria  Cranial Nerves:  II: Pupils are equal and reactive, left field cut III, IV, VI: Extraocular movements intact VII: Face is symmetrical resting  VIII: hearing intact to voice. IX, X: Phonation is normal.  XII: tongue is midline without fasciculations. Motor:  Moves right upper extremity and right lower extremity more briskly than the left side. Tone: is normal and bulk is normal Sensation-reacts to noxious stimuli in all 4 extremities Coordination: Does not complete Gait- deferred   ASSESSMENT/PLAN  Acute Ischemic Infarct: Subacute infarct in the right temporal lobe with hemorrhagic transformation Etiology: Cardioembolic likely Code Stroke CT head - Subacute infarct in the right temporal lobe, extending toward the right temporal occipital region, with areas of hemorrhage, most likely hemorrhagic transformation. This is associated with local mass effect and 3 mm of right to left midline shift. MRI-hemorrhagic right MCA infarct.  Mild cytotoxic edema.  4 mm right to left shift.  Subacute left PCA infarct. Carotid Doppler: 1 to 39% stenosis in right carotid artery, thumping waveforms and severely diminished diastolic waveforms in left carotid indicative of more proximal nearly complete occlusion 2D Echo EF 45 to 50%, severe asymmetric LVH of the  septal segment, grade 1 diastolic dysfunction, left atrium and right atrium mildly dilated, no atrial level shunt LDL 75 HgbA1c 7.7 Patient will need 30-day cardiac monitor post discharge, as results from previous monitor are not available VTE prophylaxis -SCDs aspirin 81 mg daily and clopidogrel 75 mg daily prior to admission, now on aspirin 81 mg daily Therapy recommendations: SNF Disposition: Pending  Hx of Stroke/TIA March 2024 - PCA infarction with large hemorrhagic transformation  Treated at [facility] long-term cardiac monitor, however we do not have results for it at this time. July 2023-acute infarct involving the cortical/subcortical right frontal lobe  Hypertension Home meds: Losartan, furosemide Cleviprex infusion As needed medications-labetalol, hydralazine Blood Pressure Goal: SBP between 130-150 for 24 hours and then less than 160   Hyperlipidemia Home meds: Atorvastatin 80 mg, resumed in  hospital LDL 75, goal < 70 Continue statin at discharge  Diabetes type II Uncontrolled Home meds: None HgbA1c 7.7, goal < 7.0 CBGs SSI Recommend close follow-up with PCP for better DM control  Dysphagia Patient has post-stroke dysphagia, SLP consulted    Diet   DIET DYS 2 Room service appropriate? Yes; Fluid consistency: Thin   Advance diet as tolerated  Other Stroke Risk Factors Coronary artery disease S/p CAGB Congestive heart failure  Other Active Problems CKD Cr 1.6 GFR 34 Hashimoto's Synthroid 200 mg Monday Wednesday Friday  Hospital day # 3  Patient seen and examined by NP/APP with MD. MD to update note as needed.   Cortney E Ernestina Columbia , MSN, AGACNP-BC [facility] See Amion for schedule and pager information 12/07/2022 12:09 PM  I have personally obtained history,examined this patient, reviewed notes, independently viewed imaging studies, participated in medical decision making and plan of care.ROS completed by me personally and pertinent positives fully documented  I have made any additions or clarifications directly to the above note. Agree with note above.  Plan start aspirin 81 mg daily today.  Try to get records of outpatient cardiac monitoring and at [facility].  No family available at the bedside.  Await transfer to rehab when bed available  Delia Heady, MD Medical Director [facility] Pager: [phone] 12/07/2022 2:15 PM   To contact Stroke Continuity provider, please refer to [email or web address]. After hours, contact General Neurology

## 2022-12-07 NOTE — Progress Notes (Signed)
Physical Therapy Treatment Patient Details Name: [name] MRN: [id number] DOB: [birth year] Today's Date: 12/07/2022   History of Present Illness Pt is a 72 y.o. F who presents 12/04/2022 after being found by her caregive [name] the floor. CT revealing subacute infarct in the right temporal lobe, extending toward the right temporal occipital region, with areas of hemorrhage, most likely hemorrhagic transformation. This was associated with local mass effect and 3 mm of right to left midline shift. Also noted was a remote infarct in the left parietal, occipital, and temporal lobes, with resolution of previously noted hyperdense hemorrhage. CT cervical spine negative for acute fx. 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    PT Comments  Pt supine in bed on arrival.  She is more talkative this session but intermittently following commands.  Pt did rise into standing x 2 but remains to present with poor posture and required heavy +2 assistance.  Continue to recommend rehab in a post acute setting to maximize functional gains before returning home.       Assistance Recommended at Discharge Frequent or constant Supervision/Assistance  If plan is discharge home, recommend the following:  Can travel by private vehicle    A lot of help with walking and/or transfers;A lot of help with bathing/dressing/bathroom   No  Equipment Recommendations  Other (comment) (TBA)    Recommendations for Other Services       Precautions / Restrictions Precautions Precautions: Fall;Other (comment) Precaution Comments: incontinence Restrictions Weight Bearing Restrictions: No      Mobility  Bed Mobility Overal bed mobility: Needs Assistance Bed Mobility: Supine to Sit     Supine to sit: +2 for physical assistance, Max assist     General bed mobility comments: Assistance to lift and advance B LEs to edge of bed.  She has ability to lift B LEs but doesnt follow commands well.  Pt required assistance to elevate trunk into a seated position.  pt initially leaning back but able to improve balance to assist with ADL tasks.    Transfers Overall transfer level: Needs assistance Equipment used: Rolling walker (2 wheels) Transfers: Sit to/from Stand Sit to Stand: Mod assist, Max assist           General transfer comment: Assistance to rise into standing with use of RW.  Pt presents with flexed hips and upper trunk and lacks ability to maintain and achieve standing.  Performed pericare in partial standing.  Pt then sat edge of bed and performed face to face stand pivot from bed to recliner.    Ambulation/Gait                   Stairs             Wheelchair Mobility     Tilt Bed    Modified Rankin (Stroke Patients Only)       Balance Overall balance assessment: Needs assistance Sitting-balance support: Feet supported Sitting balance-Leahy Scale: Poor Sitting balance - Comments: Poor intially but progressed to fair.  She is inconsistent     Standing balance-Leahy Scale: Poor Standing balance comment: reliant on external assistance.  Cognition Arousal/Alertness: Lethargic Behavior During Therapy: Flat affect Overall Cognitive Status: Impaired/Different from baseline Area of Impairment: Orientation, Attention, Memory, Following commands, Safety/judgement, Awareness, Problem solving                 Orientation Level: Disoriented to, Place, Time, Situation Current Attention Level: Focused Memory: Decreased short-term memory Following Commands: Follows one step commands  inconsistently Safety/Judgement: Decreased awareness of safety, Decreased awareness of deficits Awareness: Intellectual Problem Solving: Requires verbal cues General Comments: Pt not oriented to birthday or place/time/situation. sleepy with poor effort throughout assessment, frequently attempting to lie back stating she is tired. urinary incontinence standing EOB        Exercises      General Comments        Pertinent Vitals/Pain Pain Assessment Pain Assessment: Faces Faces Pain Scale: No hurt    Home Living Family/patient expects to be discharged to:: Private residence Living Arrangements: Alone Available Help at Discharge: Friend(s);Available PRN/intermittently Type of Home: Apartment Home Access: Stairs to enter Entrance Stairs-Rails: None Entrance Stairs-Number of Steps: 3 Alternate Level Stairs-Number of Steps: flight Home Layout: Two level;Able to live on main level with bedroom/bathroom Home Equipment: Cane - Programmer, applications (2 wheels);Rollator (4 wheels);BSC/3in1 Additional Comments: Information obtained from prior admission (unsure of accuracy currently)    Prior Function            PT Goals (current goals can now be found in the care plan section) Acute Rehab PT Goals Patient Stated Goal: To get warm Potential to Achieve Goals: Fair Progress towards PT goals: Progressing toward goals    Frequency    Min 3X/week      PT Plan Current plan remains appropriate    Co-evaluation PT/OT/SLP Co-Evaluation/Treatment: Yes Reason for Co-Treatment: Complexity of the patient's impairments (multi-system involvement);To address functional/ADL transfers PT goals addressed during session: Mobility/safety with mobility OT goals addressed during session: ADL's and self-care      AM-PAC PT "6 Clicks" Mobility   Outcome Measure  Help needed turning from your back to your side while in a flat bed without using bedrails?: A Little Help needed moving from lying  on your back to sitting on the side of a flat bed without using bedrails?: A Lot Help needed moving to and from a bed to a chair (including a wheelchair)?: A Lot Help needed standing up from a chair using your arms (e.g., wheelchair or bedside chair)?: A Lot Help needed to walk in hospital room?: Total Help needed climbing 3-5 steps with a railing? : Total 6 Click Score: 11    End of Session Equipment Utilized During Treatment: Gait belt Activity Tolerance: Patient limited by fatigue;Patient tolerated treatment well Patient left: with call bell/phone within reach;in chair;with chair alarm set Nurse Communication: Mobility status PT Visit Diagnosis: Unsteadiness on feet (R26.81);History of falling (Z91.81);Difficulty in walking, not elsewhere classified (R26.2)     Time: 1610-9604 PT Time Calculation (min) (ACUTE ONLY): 23 min  Charges:    $Therapeutic Activity: 8-22 mins PT General Charges $$ ACUTE PT VISIT: 1 Visit                     Bonney Leitz , PTA Acute Rehabilitation Services Office [phone]    Florestine Avers 12/07/2022, 12:56 PM

## 2022-12-07 NOTE — Progress Notes (Signed)
Pt bladder volume >968mL. Encouraged pt to void, assisted transfer from bed to bedside commode with front wheel walker. Pt tolerated ambulation. Voided some on the floor with only output per bedside commode. Assisted pt back in the bed. Bladder scan repeated with volume noted.  In/Out catheterization done aseptically, output . Bladder scan residual volume 0mL. Will monitor

## 2022-12-07 NOTE — TOC Initial Note (Signed)
Transition of Care [facility]) - Initial/Assessment Note    Patient Details  Name: [name] MRN: [id number] Date of Birth: Sep 15, 1950  [facility]) CM/SW Contact:    Baldemar Lenis, LCSW Phone Number: 12/07/2022, 4:14 PM  Clinical Narrative:      CSW spoke with patient's friend and POA Hilda Lias, to discuss recommendation for SNF. Marie in agreement, preference for [facility] as the patient has been there for rehab in the past. Lengthy discussion with Hilda Lias on the patient's recent history and concern that the patient will not be able to return home this time, how the patient is upset with her because she did not want to come to the hospital and was mad that Hilda Lias called 911. Emotional support provided that Hilda Lias did the right thing and got the patient assistance. CSW to complete referral and send to SNF, will follow up with offers.             Expected Discharge Plan: Skilled Nursing Facility Barriers to Discharge: Continued Medical Work up, English as a second language teacher   Patient Goals and CMS Choice Patient states their goals for this hospitalization and ongoing recovery are:: patient unable to participate in goal setting, not oriented CMS Medicare.gov Compare Post Acute Care list provided to:: Patient Represenative (must comment) Choice offered to / list presented to : [facility] POA / Guardian [place] ownership interest in [facility].provided to:: [facility] POA / Guardian    Expected Discharge Plan and Services     Post Acute Care Choice: Skilled Nursing Facility Living arrangements for the past 2 months: Single Family Home                                      Prior Living Arrangements/Services Living arrangements for the past 2 months: Single Family Home Lives with:: Self Patient language and need for interpreter reviewed:: No Do you feel safe going back to the place where you live?: Yes      Need for Family Participation in Patient Care: Yes (Comment) Care giver  support system in place?: No (comment)   Criminal Activity/Legal Involvement Pertinent to Current Situation/Hospitalization: No - Comment as needed  Activities of Daily Living      Permission Sought/Granted Permission sought to share information with : Facility Medical sales representative, Family Supports Permission granted to share information with : Yes, Verbal Permission Granted  Share Information with NAME: Hilda Lias  Permission granted to share info w AGENCY: SNF  Permission granted to share info w Relationship: Friend/HCPOA     Emotional Assessment   Attitude/Demeanor/Rapport: Unable to Assess Affect (typically observed): Unable to Assess Orientation: : Oriented to Self Alcohol / Substance Use: Not Applicable Psych Involvement: No (comment)  Admission diagnosis:  Stroke (cerebrum) (HCC) [I63.9] Patient Active Problem List   Diagnosis Date Noted   Stroke (cerebrum) (HCC) 12/04/2022   Malnutrition of moderate degree 08/22/2022   Acute CVA (cerebrovascular accident) (HCC) 08/18/2022   HFrEF (heart failure with reduced ejection fraction) (HCC) 08/18/2022   Carotid stenosis 08/11/2022   PAD (peripheral artery disease) (HCC) 08/11/2022   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 06/24/2022   Weakness of right upper extremity 05/20/2022   CVA (cerebral vascular accident) (HCC) 05/20/2022   Hypoglycemia 04/07/2022   Abnormal EKG 04/07/2022   Ischemic pain of left foot    Ischemic foot 07/15/2019   Healthcare maintenance 11/30/2018   Amaurosis  fugax of right eye 12/14/2017   Bilateral leg edema 09/28/2017   Hyperlipidemia, mixed 09/28/2017   Anemia 09/01/2017   Chronic insomnia 01/26/2017   Postmenopausal 08/05/2016   CKD (chronic kidney disease) stage 4, GFR 15-29 ml/min (HCC) 10/16/2014   Hypothyroidism 08/06/2014   Type 2 diabetes mellitus without complication (HCC) 08/06/2014   Feces contents abnormal 11/07/2013   HYPERTENSION, BENIGN 11/10/2008    CAD, ARTERY BYPASS GRAFT 11/10/2008   PCP:  Lauro Regulus, MD Pharmacy:   [id number] - [place], [place] - [address] [place] [address] Phone: [phone] Fax: [phone]     Social Determinants of Health (SDOH) Social History: SDOH Screenings   Food Insecurity: No Food Insecurity (08/19/2022)  Housing: Low Risk  (08/19/2022)  Transportation Needs: No Transportation Needs (08/19/2022)  Utilities: Not At Risk (08/19/2022)  Tobacco Use: Low Risk  (12/04/2022)   SDOH Interventions:     Readmission Risk Interventions     No data to display

## 2022-12-07 NOTE — Evaluation (Signed)
Occupational Therapy Evaluation Patient Details Name: [name] MRN: [id number] DOB: 07-Jul-1950 Today's Date: 12/07/2022   History of Present Illness Pt is a 72 y.o. F who presents 12/04/2022 after being found by her caregive ron the floor. CT revealing subacute infarct in the right temporal lobe, extending toward the right temporal occipital region, with areas of hemorrhage, most likely hemorrhagic transformation. 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   Clinical Impression   PTA pt living at home with caregiver assistance. Pt lethargic however level of arousal improved with mobility. Pt was able to sit EOB and assist with bathing and dressing. Max A +2 to complete transfer to chair due to deficits listed below. Patient will benefit from continued inpatient follow up therapy, <3 hours/day. Acute OT to follow.      Recommendations for follow up therapy are one component of a multi-disciplinary discharge planning process, led by the attending physician.  Recommendations may be updated based on patient status, additional functional criteria and insurance authorization.   Assistance Recommended at Discharge Frequent or constant Supervision/Assistance  Patient can return home with the following  (NA)    Functional Status Assessment  Patient has had a recent decline in their functional status and demonstrates the ability to make significant improvements in function in a reasonable and predictable amount of time.  Equipment Recommendations  None recommended by OT    Recommendations for Other Services       Precautions / Restrictions Precautions Precautions: Fall;Other  (comment) Precaution Comments: incontinence Restrictions Weight Bearing Restrictions: No      Mobility Bed Mobility Overal bed mobility: Needs Assistance Bed Mobility: Supine to Sit     Supine to sit: +2 for physical assistance, Max assist Sit to supine: Total assist   General bed mobility comments: no initiation to move EOB although pt was saying she owuld    Transfers Overall transfer level: Needs assistance Equipment used: Rolling walker (2 wheels) Transfers: Sit to/from Stand Sit to Stand: Max assist, +2 physical assistance           General transfer comment: Assistance to rise into standing with use of RW.  Pt presents with flexed hips and upper trunk and lacks ability to maintain and achieve standing.  Performed pericare in partial standing.  Pt then sat edge of bed and performed face to face stand pivot from bed to recliner.      Balance Overall balance assessment: Needs assistance Sitting-balance support: Feet supported Sitting balance-Leahy Scale: Poor Sitting balance - Comments: Poor intially but progressed to fair.  She is inconsistent   Standing balance support: Bilateral upper extremity supported Standing balance-Leahy Scale: Poor Standing balance comment: reliant on external assistance.                           ADL either performed or assessed with clinical judgement   ADL Overall ADL's : Needs assistance/impaired Eating/Feeding: Moderate assistance   Grooming: Moderate assistance   Upper Body Bathing: Moderate assistance   Lower Body Bathing: Moderate assistance   Upper Body Dressing : Maximal assistance   Lower Body Dressing: Maximal assistance;Sit to/from stand   Toilet Transfer: Maximal assistance;+2 for physical assistance   Toileting-  Clothing Manipulation and Hygiene: Total assistance (incontinent)       Functional mobility during ADLs: Maximal assistance;+2 for physical assistance       Vision   Additional  Comments: conjugate gaze; most likley has field cuts; poor visual attention; eyes closed @ 80% of session     Perception Perception Comments: perceptual deficits; L inattention   Praxis Praxis Praxis tested?: Deficits    Pertinent Vitals/Pain Pain Assessment Pain Assessment: Faces Faces Pain Scale: Hurts a little bit     Hand Dominance Right   Extremity/Trunk Assessment Upper Extremity Assessment Upper Extremity Assessment: Generalized weakness;Difficult to assess due to impaired cognition (moving BUE spontaneously; will further assess; appears to have sensory motor deficits)   Lower Extremity Assessment Lower Extremity Assessment: Defer to PT evaluation RLE Deficits / Details: No knee buckle in standing LLE Deficits / Details: No knee buckle in standing   Cervical / Trunk Assessment Cervical / Trunk Assessment: Kyphotic;Other exceptions (forward head)   Communication Communication Communication: Expressive difficulties   Cognition Arousal/Alertness: Lethargic Behavior During Therapy: Flat affect Overall Cognitive Status: Impaired/Different from baseline Area of Impairment: Orientation, Attention, Memory, Following commands, Safety/judgement, Awareness, Problem solving                 Orientation Level: Disoriented to, Place, Time, Situation Current Attention Level: Focused Memory: Decreased short-term memory Following Commands: Follows one step commands inconsistently Safety/Judgement: Decreased awareness of safety, Decreased awareness of deficits Awareness: Intellectual Problem Solving: Requires verbal cues, Slow processing, Decreased initiation, Difficulty sequencing, Requires tactile cues General Comments: Level of arousal improves with mobility     General Comments       Exercises     Shoulder Instructions      Home Living Family/patient expects to be discharged to:: Private residence Living Arrangements: Alone Available Help at Discharge:  Friend(s);Available PRN/intermittently Type of Home: Apartment Home Access: Stairs to enter Entrance Stairs-Number of Steps: 3 Entrance Stairs-Rails: None Home Layout: Two level;Able to live on main level with bedroom/bathroom Alternate Level Stairs-Number of Steps: flight Alternate Level Stairs-Rails:  (yes) Bathroom Shower/Tub: Producer, television/film/video: Standard     Home Equipment: Cane - Programmer, applications (2 wheels);Rollator (4 wheels);BSC/3in1   Additional Comments: Information obtained from prior admission (unsure of accuracy currently)  Lives With: Alone    Prior Functioning/Environment Prior Level of Function : Needs assist             Mobility Comments: using RW on prior admission ADLs Comments: has caregiver, however, pt unable to describe what they assist with or how often they come        OT Problem List: Decreased strength;Decreased range of motion;Decreased activity tolerance;Impaired balance (sitting and/or standing);Impaired vision/perception;Decreased coordination;Decreased cognition;Decreased safety awareness;Decreased knowledge of use of DME or AE;Impaired UE functional use      OT Treatment/Interventions: Self-care/ADL training;Therapeutic exercise;Neuromuscular education;DME and/or AE instruction;Therapeutic activities;Cognitive remediation/compensation;Visual/perceptual remediation/compensation;Patient/family education;Balance training    OT Goals(Current goals can be found in the care plan section) Acute Rehab OT Goals Patient Stated Goal: to be warm OT Goal Formulation: Patient unable to participate in goal setting Time For Goal Achievement: 12/21/22 Potential to Achieve Goals: Fair  OT Frequency: Min 2X/week    Co-evaluation PT/OT/SLP Co-Evaluation/Treatment: Yes Reason for Co-Treatment: Complexity of the patient's impairments (multi-system involvement);To address functional/ADL transfers PT goals addressed during session:  Mobility/safety with mobility OT goals addressed during session: ADL's and self-care      AM-PAC OT "6 Clicks" Daily Activity  Outcome Measure Help from another person eating meals?: A Lot Help from another person taking care of personal grooming?: A Lot Help from another person toileting, which includes using toliet, bedpan, or urinal?: Total Help from another person bathing (including washing, rinsing, drying)?: A Lot Help from another person to put on and taking off regular upper body clothing?: A Lot Help from another person to put on and taking off regular lower body clothing?: A Lot 6 Click Score: 11   End of Session Equipment Utilized During Treatment: Gait belt;Rolling walker (2 wheels) Nurse Communication: Mobility status  Activity Tolerance: Patient tolerated treatment well Patient left: in chair;with call bell/phone within reach;with chair alarm set  OT Visit Diagnosis: Unsteadiness on feet (R26.81);Other abnormalities of gait and mobility (R26.89);Muscle weakness (generalized) (M62.81);History of falling (Z91.81);Low vision, both eyes (H54.2);Other symptoms and signs involving cognitive function                Time: 1216-1242 OT Time Calculation (min): 26 min Charges:  OT General Charges $OT Visit: 1 Visit OT Evaluation $OT Eval Moderate Complexity: 1 Mod  Reyna Lorenzi, OT/L   Acute OT Clinical Specialist Acute Rehabilitation Services Pager [phone] Office [phone]   [facility] 12/07/2022, 1:59 PM

## 2022-12-08 DIAGNOSIS — I6300 Cerebral infarction due to thrombosis of unspecified precerebral artery: Secondary | ICD-10-CM

## 2022-12-08 LAB — GLUCOSE, CAPILLARY
Glucose-Capillary: 100 mg/dL — ABNORMAL HIGH (ref 70–99)
Glucose-Capillary: 127 mg/dL — ABNORMAL HIGH (ref 70–99)
Glucose-Capillary: 135 mg/dL — ABNORMAL HIGH (ref 70–99)
Glucose-Capillary: 76 mg/dL (ref 70–99)
Glucose-Capillary: 91 mg/dL (ref 70–99)
Glucose-Capillary: 99 mg/dL (ref 70–99)

## 2022-12-08 NOTE — TOC Progression Note (Addendum)
Transition of Care [facility]) - Progression Note    Patient Details  Name: [name] MRN: [id number] Date of Birth: [birth year]  Transition of Care [facility]) CM/SW Contact  Baldemar Lenis, [place] Phone Number: 12/08/2022, 10:31 AM  Clinical Narrative:   CSW completed referral information and faxed out, asked [facility] to review. Awaiting response.  UPDATE: CSW received denial from [facility]. CSW faxed out further. CSW spoke with Hilda Lias to provide bed offers. Hilda Lias discussed and chose [facility]. CSW updated [facility], they will need to discuss funds with Hilda Lias for possible long term care. CSW to initiate insurance authorization once [facility] confirms. CSW to follow.   Expected Discharge Plan: Skilled Nursing Facility Barriers to Discharge: Continued Medical Work up, English as a second language teacher  Expected Discharge Plan and Services     Post Acute Care Choice: Skilled Nursing Facility Living arrangements for the past 2 months: Single Family Home                                       Social Determinants of Health (SDOH) Interventions SDOH Screenings   Food Insecurity: No Food Insecurity (08/19/2022)  Housing: Low Risk  (08/19/2022)  Transportation Needs: No Transportation Needs (08/19/2022)  Utilities: Not At Risk (08/19/2022)  Tobacco Use: Low Risk  (12/04/2022)    Readmission Risk Interventions     No data to display

## 2022-12-08 NOTE — NC FL2 (Cosign Needed)
[place] MEDICAID FL2 LEVEL OF CARE FORM     IDENTIFICATION  Patient Name: [name] Birthdate: [birth year] Sex: female Admission Date (Current Location): 12/04/2022  [facility] and [place] Number:  Producer, television/film/video and Address:  [facility], [address] [place], [place] [address]      Provider Number: [id number]  Attending Physician Name and Address:  Stroke, Md, MD  Relative Name and Phone Number:       Current Level of Care: Hospital Recommended Level of Care: Skilled Nursing Facility Prior Approval Number:    Date Approved/Denied:   PASRR Number: [id number]  Discharge Plan: SNF    Current Diagnoses: Patient Active Problem List   Diagnosis Date Noted   Stroke (cerebrum) (HCC) 12/04/2022   Malnutrition of moderate degree 08/22/2022   Acute CVA (cerebrovascular accident) (HCC) 08/18/2022   HFrEF (heart failure with reduced ejection fraction) (HCC) 08/18/2022   Carotid stenosis 08/11/2022   PAD (peripheral artery disease) (HCC) 08/11/2022   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 06/24/2022   Weakness of right upper extremity 05/20/2022   CVA (cerebral vascular accident) (HCC) 05/20/2022   Hypoglycemia 04/07/2022   Abnormal EKG 04/07/2022   Ischemic pain of left foot    Ischemic foot 07/15/2019   Healthcare maintenance 11/30/2018   Amaurosis fugax of right eye 12/14/2017   Bilateral leg edema 09/28/2017   Hyperlipidemia, mixed 09/28/2017   Anemia 09/01/2017   Chronic insomnia 01/26/2017   Postmenopausal 08/05/2016   CKD (chronic kidney disease) stage 4, GFR 15-29 ml/min (HCC) 10/16/2014   Hypothyroidism 08/06/2014   Type 2 diabetes mellitus without complication (HCC) 08/06/2014   Feces contents abnormal 11/07/2013   HYPERTENSION, BENIGN 11/10/2008   CAD, ARTERY BYPASS GRAFT 11/10/2008    Orientation RESPIRATION BLADDER Height & Weight     Self  Normal Incontinent Weight:   Height:      BEHAVIORAL SYMPTOMS/MOOD NEUROLOGICAL BOWEL NUTRITION STATUS      Incontinent Diet (see DC summary)  AMBULATORY STATUS COMMUNICATION OF NEEDS Skin   Extensive Assist Verbally Normal                       Personal Care Assistance Level of Assistance  Bathing, Feeding, Dressing Bathing Assistance: Maximum assistance Feeding assistance: Maximum assistance Dressing Assistance: Maximum assistance     Functional Limitations Info  Speech     Speech Info: Impaired (receptive aphasia, delayed responses)    SPECIAL CARE FACTORS FREQUENCY  PT (By licensed PT), OT (By licensed OT), Speech therapy     PT Frequency: 5x/wk OT Frequency: 5x/wk     Speech Therapy Frequency: 5x/wk      Contractures Contractures Info: Not present    Additional Factors Info  Code Status, Allergies, Insulin Sliding Scale Code Status Info: DNR Allergies Info: Niacin, Tramadol, Ultram (Tramadol Hcl), Baclofen, Cardura (Doxazosin Mesylate), Doxazosin, Morphine And Codeine, Other   Insulin Sliding Scale Info: see DC summary       Current Medications (12/08/2022):  This is the current hospital active medication list Current Facility-Administered Medications  Medication Dose Route Frequency Provider Last Rate Last Admin   (feeding supplement) PROSource Plus liquid 30 mL  30 mL Oral TID BM Caryl Pina, MD   30 mL at 12/07/22 2237   acetaminophen (TYLENOL) tablet 650 mg  650 mg Oral Q4H PRN Caryl Pina, MD       Or   acetaminophen (TYLENOL) 160 MG/5ML solution 650  mg  650 mg Per Tube Q4H PRN Caryl Pina, MD       Or   acetaminophen (TYLENOL) suppository 650 mg  650 mg Rectal Q4H PRN Caryl Pina, MD       allopurinol (ZYLOPRIM) tablet 100 mg  100 mg Oral Daily Caryl Pina, MD   100 mg at 12/07/22 0920   aspirin chewable tablet 81 mg  81 mg Oral Daily de Saintclair Halsted, Cortney E, NP   81 mg at 12/07/22 0920   atorvastatin (LIPITOR) tablet 80 mg  80 mg Oral QHS Caryl Pina, MD   80 mg at 12/07/22 2236    calcitRIOL (ROCALTROL) capsule 0.25 mcg  0.25 mcg Oral Daily Caryl Pina, MD   0.25 mcg at 12/06/22 1610   Chlorhexidine Gluconate Cloth 2 % PADS 6 each  6 each Topical Daily Caryl Pina, MD   6 each at 12/07/22 0920   insulin aspart (novoLOG) injection 0-15 Units  0-15 Units Subcutaneous Q4H Caryl Pina, MD   2 Units at 12/07/22 2236   levothyroxine (SYNTHROID) tablet 200 mcg  200 mcg Oral Q M,W,F Caryl Pina, MD   200 mcg at 12/07/22 0530   losartan (COZAAR) tablet 50 mg  50 mg Oral Daily Caryl Pina, MD   50 mg at 12/07/22 0920   multivitamin with minerals tablet 1 tablet  1 tablet Oral Daily Caryl Pina, MD   1 tablet at 12/07/22 0920   Oral care mouth rinse  15 mL Mouth Rinse PRN Caryl Pina, MD       senna-docusate (Senokot-S) tablet 1 tablet  1 tablet Oral QHS PRN Caryl Pina, MD       sodium chloride 0.9 % bolus 250 mL  250 mL Intravenous Once de Saintclair Halsted, Lennox Solders, NP         Discharge Medications: Please see discharge summary for a list of discharge medications.  Relevant Imaging Results:  Relevant Lab Results:   Additional Information SS#: [id number]  Baldemar Lenis, LCSW   I have personally obtained history,examined this patient, reviewed notes, independently viewed imaging studies, participated in medical decision making and plan of care.ROS completed by me personally and pertinent positives fully documented  I have made any additions or clarifications directly to the above note. Agree with note above.    Delia Heady, MD Medical Director [facility] Pager: [phone] 12/09/2022 3:34 PM

## 2022-12-08 NOTE — Plan of Care (Signed)
Problem: Education: Goal: Knowledge of disease or condition will improve 12/08/2022 0124 by Hortencia Pilar, RN Outcome: Progressing 12/08/2022 0124 by Hortencia Pilar, RN Outcome: Progressing Goal: Knowledge of secondary prevention will improve (MUST DOCUMENT ALL) 12/08/2022 0124 by Hortencia Pilar, RN Outcome: Progressing 12/08/2022 0124 by Hortencia Pilar, RN Outcome: Progressing Goal: Knowledge of patient specific risk factors will improve Loraine Leriche N/A or DELETE if not current risk factor) 12/08/2022 0124 by Hortencia Pilar, RN Outcome: Progressing 12/08/2022 0124 by Hortencia Pilar, RN Outcome: Progressing   Problem: Ischemic Stroke/TIA Tissue Perfusion: Goal: Complications of ischemic stroke/TIA will be minimized 12/08/2022 0124 by Hortencia Pilar, RN Outcome: Progressing 12/08/2022 0124 by Hortencia Pilar, RN Outcome: Progressing   Problem: Coping: Goal: Will verbalize positive feelings about self 12/08/2022 0124 by Hortencia Pilar, RN Outcome: Progressing 12/08/2022 0124 by Hortencia Pilar, RN Outcome: Progressing Goal: Will identify appropriate support needs 12/08/2022 0124 by Hortencia Pilar, RN Outcome: Progressing 12/08/2022 0124 by Hortencia Pilar, RN Outcome: Progressing   Problem: Health Behavior/Discharge Planning: Goal: Ability to manage health-related needs will improve 12/08/2022 0124 by Hortencia Pilar, RN Outcome: Progressing 12/08/2022 0124 by Hortencia Pilar, RN Outcome: Progressing Goal: Goals will be collaboratively established with patient/family 12/08/2022 0124 by Hortencia Pilar, RN Outcome: Progressing 12/08/2022 0124 by Hortencia Pilar, RN Outcome: Progressing   Problem: Self-Care: Goal: Ability to participate in self-care as condition permits will improve Outcome: Progressing Goal: Verbalization of feelings and concerns over difficulty with self-care will improve Outcome: Progressing Goal: Ability to communicate needs accurately will  improve Outcome: Progressing   Problem: Nutrition: Goal: Risk of aspiration will decrease Outcome: Progressing Goal: Dietary intake will improve Outcome: Progressing   Problem: Education: Goal: Ability to describe self-care measures that may prevent or decrease complications (Diabetes Survival Skills Education) will improve Outcome: Progressing Goal: Individualized Educational Video(s) Outcome: Progressing   Problem: Coping: Goal: Ability to adjust to condition or change in health will improve Outcome: Progressing   Problem: Fluid Volume: Goal: Ability to maintain a balanced intake and output will improve Outcome: Progressing   Problem: Health Behavior/Discharge Planning: Goal: Ability to identify and utilize available resources and services will improve Outcome: Progressing Goal: Ability to manage health-related needs will improve Outcome: Progressing   Problem: Metabolic: Goal: Ability to maintain appropriate glucose levels will improve Outcome: Progressing   Problem: Nutritional: Goal: Maintenance of adequate nutrition will improve Outcome: Progressing Goal: Progress toward achieving an optimal weight will improve Outcome: Progressing   Problem: Skin Integrity: Goal: Risk for impaired skin integrity will decrease Outcome: Progressing   Problem: Tissue Perfusion: Goal: Adequacy of tissue perfusion will improve Outcome: Progressing   Problem: Education: Goal: Knowledge of General Education information will improve Description: Including pain rating scale, medication(s)/side effects and non-pharmacologic comfort measures Outcome: Progressing   Problem: Health Behavior/Discharge Planning: Goal: Ability to manage health-related needs will improve Outcome: Progressing   Problem: Clinical Measurements: Goal: Ability to maintain clinical measurements within normal limits will improve Outcome: Progressing Goal: Will remain free from infection Outcome:  Progressing Goal: Diagnostic test results will improve Outcome: Progressing Goal: Respiratory complications will improve Outcome: Progressing Goal: Cardiovascular complication will be avoided Outcome: Progressing   Problem: Activity: Goal: Risk for activity intolerance will decrease Outcome: Progressing   Problem: Nutrition: Goal: Adequate nutrition will be maintained Outcome: Progressing   Problem: Coping: Goal: Level of anxiety will decrease Outcome: Progressing   Problem: Elimination: Goal: Will not experience complications related to  bowel motility Outcome: Progressing Goal: Will not experience complications related to urinary retention Outcome: Progressing   Problem: Pain Managment: Goal: General experience of comfort will improve Outcome: Progressing   Problem: Safety: Goal: Ability to remain free from injury will improve Outcome: Progressing   Problem: Skin Integrity: Goal: Risk for impaired skin integrity will decrease Outcome: Progressing

## 2022-12-08 NOTE — Progress Notes (Addendum)
STROKE TEAM PROGRESS NOTE   BRIEF HPI [name] is a 72 y.o. 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 Her caregiver had come to check on the patient after attempting to contact her by phone with no answer .  SIGNIFICANT HOSPITAL EVENTS 3/18- discharged from [facility] 3/22- ED [facility] as code stroke and sent to [facility] 6/28- admit to [facility], transferred from [facility] 6/30- transfer out of ICU 6/29-MRI brain large evolving right MCA infarct with hemorrhagic transformation.  Mild cytotoxic edema with 4 mm right to left shift. 6/29-MR angiogram brain shows right M3 occlusion and chronic distal left P3 stenosis/occlusion.  Chronic left ICA occlusion.  Moderate to severe right A1 and left A2 stenosis  INTERIM HISTORY/SUBJECTIVE Patient is awake, oriented to person but not place and time.  She remains hemodynamically stable.  She is awaiting SNF placement.  OBJECTIVE  CBC    Component Value Date/Time   WBC 6.4 12/04/2022 1753   RBC 4.51 12/04/2022 1753   HGB 12.1 12/04/2022 1753   HGB 8.3 (L) 02/05/2012 0833   HCT 40.6 12/04/2022 1753   HCT 26.8 (L) 01/25/2012 0837   PLT 271 12/04/2022 1753   PLT 227 02/03/2012 0426   MCV 90.0 12/04/2022 1753   MCV 70 (L) 01/25/2012 0837   MCH 26.8 12/04/2022 1753   MCHC 29.8 (L) 12/04/2022 1753   RDW 17.3 (H) 12/04/2022 1753   RDW 17.8 (H) 01/25/2012 0837   LYMPHSABS 1.6 08/28/2022 1637   MONOABS 1.0 08/28/2022 1637   EOSABS 0.1 08/28/2022 1637   BASOSABS 0.0 08/28/2022 1637    BMET    Component Value Date/Time   NA 141 12/04/2022 1753   NA 144 02/03/2012  0426   K 4.8 12/04/2022 1753   K 4.4 02/03/2012 0426   CL 111 12/04/2022 1753   CL 113 (H) 02/03/2012 0426   CO2 18 (L) 12/04/2022 1753   CO2 24 02/03/2012 0426   GLUCOSE 130 (H) 12/04/2022 1753   GLUCOSE 153 (H) 02/03/2012 0426   BUN 29 (H) 12/04/2022 1753   BUN 22 (H) 02/03/2012 0426   CREATININE 1.60 (H) 12/04/2022 1753   CREATININE 1.76 (H) 08/18/2013 1019   CALCIUM 8.9 12/04/2022 1753   CALCIUM 8.1 (L) 02/03/2012 0426   GFRNONAA 34 (L) 12/04/2022 1753   GFRNONAA 30 (L) 08/18/2013 1019    IMAGING past 24 hours No results found.  Vitals:   12/07/22 1957 12/08/22 0351 12/08/22 0724 12/08/22 1105  BP: 130/65 (!) 134/53 130/61 127/81  Pulse: 62 60 (!) 55 (!) 58  Resp: 16 16 18 18   Temp: 98.3 F (36.8 C) 97.7 F (36.5 C) 97.7 F (36.5 C) 98.6 F (37 C)  TempSrc: Oral Oral Axillary Oral  SpO2: 99% 97% 97%      PHYSICAL EXAM General: Drowsy, well-nourished, well-developed elderly patient in no acute distress Psych:  Mood and affect appropriate for situation CV: Regular rate and rhythm on monitor Respiratory:  Regular, unlabored respirations on room air GI: Abdomen soft and nontender   NEURO:  Mental Status: Opens eyes to verbal stimuli. Follows commands bilaterally.  Alert and oriented to self only  Speech/Language: Speech is without dysarthria  Cranial Nerves:  III, IV, VI: Extraocular movements intact VII: Face is symmetrical resting  VIII: hearing intact to voice. IX, X: Phonation is normal.  XII: tongue is midline without fasciculations. Motor: Moves right upper extremity and right lower extremity more briskly than the left side. Tone: is normal and bulk is normal Sensation-appears intact to light touch Coordination: Does not complete Gait- deferred   ASSESSMENT/PLAN  Acute Ischemic Infarct: Subacute infarct in the right temporal lobe with hemorrhagic transformation Etiology: Cardioembolic likely Code Stroke CT head - Subacute infarct in the right  temporal lobe, extending toward the right temporal occipital region, with areas of hemorrhage, most likely hemorrhagic transformation. This is associated with local mass effect and 3 mm of right to left midline shift. MRI-hemorrhagic right MCA infarct.  Mild cytotoxic edema.  4 mm right to left shift.  Subacute left PCA infarct. Carotid Doppler: 1 to 39% stenosis in right carotid artery, thumping waveforms and severely diminished diastolic waveforms in left carotid indicative of more proximal nearly complete occlusion 2D Echo EF 45 to 50%, severe asymmetric LVH of the septal segment, grade 1 diastolic dysfunction, left atrium and right atrium mildly dilated, no atrial level shunt LDL 75 HgbA1c 7.7 Patient will need 30-day cardiac monitor post discharge, as results from previous monitor are not available VTE prophylaxis -SCDs aspirin 81 mg daily and clopidogrel 75 mg daily prior to admission, now on aspirin 81 mg daily Therapy recommendations: SNF Disposition: Pending  Hx of Stroke/TIA March 2024 - PCA infarction with large hemorrhagic transformation  Treated at [facility] long-term cardiac monitor, however we do not have results for it at this time. July 2023-acute infarct involving the cortical/subcortical right frontal lobe  Hypertension Home meds: Losartan, furosemide Stable off Cleviprex As needed medications-labetalol, hydralazine Blood Pressure Goal: SBP between 130-150 for 24 hours and then less than 160   Hyperlipidemia Home meds: Atorvastatin 80 mg, resumed in hospital LDL 75, goal < 70 Continue statin at discharge  Diabetes type II Uncontrolled Home meds: None HgbA1c 7.7, goal < 7.0 CBGs SSI Recommend close follow-up with PCP for better DM control  Dysphagia Patient has post-stroke dysphagia, SLP consulted    Diet   DIET DYS 2 Room service appropriate? Yes; Fluid consistency: Thin   Advance diet as tolerated  Other Stroke Risk Factors Coronary artery disease S/p  CABG Congestive heart failure  Other Active Problems CKD Cr 1.6 GFR 34 Hashimoto's Synthroid 200 mg Monday Wednesday Friday  Hospital day # 4  Patient seen and examined by NP/APP with MD. MD to update note as needed.   Cortney E Ernestina Columbia , MSN, AGACNP-BC [facility] See Amion for schedule and pager information 12/08/2022 12:49 PM I have personally obtained history,examined this patient, reviewed notes, independently viewed imaging studies, participated in medical decision making and plan of care.ROS completed by me personally and pertinent positives fully documented  I have made any additions or clarifications directly to the above note. Agree with note above.  Patient medically stable to be transferred to skilled nursing facility when bed available.  Delia Heady, MD Medical Director [facility] Pager: [phone] 12/08/2022 2:48 PM  To contact Stroke Continuity provider, please refer to [email or web address]. After hours, contact General Neurology

## 2022-12-09 LAB — GLUCOSE, CAPILLARY
Glucose-Capillary: 136 mg/dL — ABNORMAL HIGH (ref 70–99)
Glucose-Capillary: 208 mg/dL — ABNORMAL HIGH (ref 70–99)
Glucose-Capillary: 77 mg/dL (ref 70–99)
Glucose-Capillary: 86 mg/dL (ref 70–99)
Glucose-Capillary: 92 mg/dL (ref 70–99)
Glucose-Capillary: 95 mg/dL (ref 70–99)

## 2022-12-09 LAB — BASIC METABOLIC PANEL
Anion gap: 8 (ref 5–15)
BUN: 27 mg/dL — ABNORMAL HIGH (ref 8–23)
CO2: 21 mmol/L — ABNORMAL LOW (ref 22–32)
Calcium: 8.4 mg/dL — ABNORMAL LOW (ref 8.9–10.3)
Chloride: 113 mmol/L — ABNORMAL HIGH (ref 98–111)
Creatinine, Ser: 1.64 mg/dL — ABNORMAL HIGH (ref 0.44–1.00)
GFR, Estimated: 33 mL/min — ABNORMAL LOW (ref >60)
Glucose, Bld: 93 mg/dL (ref 70–99)
Potassium: 3.9 mmol/L (ref 3.5–5.1)
Sodium: 142 mmol/L (ref 135–145)

## 2022-12-09 LAB — MAGNESIUM: Magnesium: 2.3 mg/dL (ref 1.7–2.4)

## 2022-12-09 NOTE — Progress Notes (Signed)
Physical Therapy Treatment Patient Details Name: [name] MRN: [id number] DOB: 1951/02/22 Today's Date: 12/09/2022   History of Present Illness Pt is a 72 y.o. F who presents 12/04/2022 after being found by her caregive ron the floor. CT revealing subacute infarct in the right temporal lobe, extending toward the right temporal occipital region, with areas of hemorrhage, most likely hemorrhagic transformation. 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    PT Comments  Pt greeted sleeping but easily roused, pleasant and participatory throughout session with good progress towards acute goals.  Pt continues to be limited by cognition, with difficulty following verbal only commands and initiating mobility. Pt needing mod A to begin bed mobility however once initiated pt able to come to sitting EOB with min A. Pt able to progress OOB mobility with grossly min A to rise to stand and for short gait bout in room with RW support. Pt needing max cues throughout session to not pull at lines as pt significantly preoccupied with foley and telemetry lines during session. Current plan remains appropriate to address deficits and maximize functional independence and decrease caregiver burden. Pt continues to benefit from skilled PT services to progress toward functional mobility goals.      Assistance Recommended at Discharge Frequent or constant Supervision/Assistance  If plan is discharge home, recommend the following:  Can travel by private vehicle    A lot of help with walking and/or transfers;A lot of help with bathing/dressing/bathroom   No  Equipment Recommendations  Other (comment)  (TBA)    Recommendations for Other Services       Precautions / Restrictions Precautions Precautions: Fall;Other (comment) Precaution Comments: foley Restrictions Weight Bearing Restrictions: No     Mobility  Bed Mobility Overal bed mobility: Needs Assistance Bed Mobility: Supine to Sit     Supine to sit: Mod assist, Min assist     General bed mobility comments: mod A to initiate mobility as pt with difficulty following verbal commands at start, once initiated pt able to elevate trunk into sitting with min A    Transfers Overall transfer level: Needs assistance Equipment used: Rolling walker (2 wheels) Transfers: Sit to/from Stand Sit to Stand: Min assist           General transfer comment: min A to rise to stand, HEAVY cues for hand placement as pt preoccupied with lines and foley, max cues to keep hands on RW    Ambulation/Gait Ambulation/Gait assistance: Min assist Gait Distance (Feet): 5 Feet Assistive device: Rolling walker (2 wheels) Gait Pattern/deviations: Step-through pattern, Decreased stride length, Trunk flexed Gait velocity: decr     General Gait Details: short gait from EOB to windows, min A to steady, MAX cues to keep hands on RW as pt reaching for objects on tray and attempting to fidget with telemetry lines   Stairs             Wheelchair Mobility     Tilt Bed    Modified Rankin (Stroke Patients Only) Modified Rankin (Stroke Patients Only) Pre-Morbid Rankin Score: Moderate disability Modified Rankin: Moderately severe disability     Balance Overall balance assessment: Needs assistance Sitting-balance support: Feet supported Sitting balance-Leahy Scale: Poor Sitting balance - Comments: Poor intially  but progressed to fair.  She is inconsistent   Standing balance support: Bilateral upper extremity supported Standing balance-Leahy Scale: Poor Standing balance comment: reliant on external assistance.                             Cognition Arousal/Alertness: Awake/alert Behavior During Therapy: Flat affect Overall Cognitive Status: Impaired/Different from baseline Area of Impairment: Orientation, Attention, Memory, Following commands, Safety/judgement, Awareness, Problem solving                 Orientation Level: Disoriented to, Place, Time, Situation Current Attention Level: Focused Memory: Decreased short-term memory Following Commands: Follows one step commands inconsistently Safety/Judgement: Decreased awareness of safety, Decreased awareness of deficits Awareness: Intellectual Problem Solving: Requires verbal cues, Slow processing, Decreased initiation, Difficulty sequencing, Requires tactile cues General Comments: pt with increased alertness this session, pleasant and participatory, pt pre-occupied with telemetry lines and foley needing max cues to not pull        Exercises      General Comments General comments (skin integrity, edema, etc.): VSS on RA, pt up in chair for breakfast at end of sesion.      Pertinent Vitals/Pain Pain Assessment Pain Assessment: No/denies pain    Home Living                          Prior Function            PT Goals (current goals can now be found in the care plan section) Acute Rehab PT Goals PT Goal Formulation: With patient Time For Goal Achievement: 12/19/22 Progress towards PT goals: Progressing toward goals    Frequency    Min 3X/week      PT Plan Current plan remains appropriate    Co-evaluation              AM-PAC PT "6 Clicks" Mobility   Outcome Measure  Help needed turning from your back to your side while in a flat bed without using bedrails?: A Little Help needed moving from lying on your back to sitting on the side of a flat bed without using bedrails?: A Lot Help needed moving to and from a bed to a chair (including a wheelchair)?: A Lot Help needed standing up from a chair using your arms (e.g.,  wheelchair or bedside chair)?: A Little Help needed to walk in hospital room?: A Little Help needed climbing 3-5 steps with a railing? : Total 6 Click Score: 14    End of Session Equipment Utilized During Treatment: Gait belt Activity Tolerance: Patient limited by fatigue;Patient tolerated treatment well Patient left: with call bell/phone within reach;in chair;with chair alarm set Nurse Communication: Mobility status PT Visit Diagnosis: Unsteadiness on feet (R26.81);History of falling (Z91.81);Difficulty in walking, not elsewhere classified (R26.2)     Time: 4098-1191 PT Time Calculation (min) (ACUTE ONLY): 21 min  Charges:    $Therapeutic Activity: 8-22 mins PT General Charges $$ ACUTE PT VISIT: 1 Visit                     Lizvette Lightsey R. PTA Acute Rehabilitation Services Office: [phone]   Catalina Antigua 12/09/2022, 11:02 AM

## 2022-12-09 NOTE — Progress Notes (Signed)
Speech Language Pathology Treatment: Dysphagia;Cognitive-Linquistic  Patient Details Name: [name] MRN: [id number] DOB: February 10, 1951 Today's Date: 12/09/2022 Time: 4098-1191 SLP Time Calculation (min) (ACUTE ONLY): 15 min  Assessment / Plan / Recommendation Clinical Impression  Pt was seen for treatment. She was alert and cooperative during the session. Pt reported that she lives alone, and manages her bills and medications, but does not cook. Pt was unable to provide any further information regarding her source of food. Pt's processing speed was slow and repetition was often necessary for pt to provide responses to questions. She required verbal prompts to attend to tasks and frequently became distracted by the cord for her call bell. Pt was educated regarding use of the call bell an of its purpose, but she exhibited difficulty with retention of this information and inquired three times as to what the cord was for. Pt tolerated dysphagia 3 solids and thin liquids via straw without overt s/s of aspiration. Mastication was mildly prolonged, but functional and oral clearance was adequate. Pt's diet will be advanced to dysphagia 3 and thin liquids. SLP will continue to follow pt.     HPI HPI: Pt is a 72 y.o. female who presented after a fall. It was estimated that the patient had possibly been down for about 10-15 hrs. MRI brain 6/29: Acute right M3 occlusion, in keeping with the evolving right MCA  territory infarct. Chronic distal left P3 stenosis and occlusion. 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      SLP Plan  Continue with current plan of care      Recommendations for follow up therapy are one  component of a multi-disciplinary discharge planning process, led by the attending physician.  Recommendations may be updated based on patient status, additional functional criteria and insurance authorization.    Recommendations  Diet recommendations: Dysphagia 3 (mechanical soft);Thin liquid Liquids provided via: Cup;Straw Medication Administration: Crushed with puree (or whole depending on mentation) Supervision: Staff to assist with self feeding;Intermittent supervision to cue for compensatory strategies Compensations: Slow rate;Minimize environmental distractions;Small sips/bites Postural Changes and/or Swallow Maneuvers: Seated upright 90 degrees                  Oral care BID;Staff/trained caregiver to provide oral care   Frequent or constant Supervision/Assistance Dysphagia, unspecified (R13.10);Cognitive communication deficit (R41.841)     Continue with current plan of care    [name] I. Katie Clock, MS, CCC-SLP Acute Rehabilitation Services Office number [phone]  [name]  12/09/2022, 12:21 PM

## 2022-12-09 NOTE — Plan of Care (Signed)
  Problem: Education: Goal: Knowledge of disease or condition will improve Outcome: Progressing   Problem: Ischemic Stroke/TIA Tissue Perfusion: Goal: Complications of ischemic stroke/TIA will be minimized Outcome: Progressing   Problem: Coping: Goal: Will verbalize positive feelings about self Outcome: Progressing Goal: Will identify appropriate support needs Outcome: Progressing   Problem: Self-Care: Goal: Ability to participate in self-care as condition permits will improve Outcome: Progressing Goal: Ability to communicate needs accurately will improve Outcome: Progressing   Problem: Nutrition: Goal: Risk of aspiration will decrease Outcome: Progressing Goal: Dietary intake will improve Outcome: Progressing   Problem: Coping: Goal: Ability to adjust to condition or change in health will improve Outcome: Progressing   Problem: Metabolic: Goal: Ability to maintain appropriate glucose levels will improve Outcome: Progressing   Problem: Nutritional: Goal: Maintenance of adequate nutrition will improve Outcome: Progressing

## 2022-12-09 NOTE — TOC Progression Note (Signed)
Transition of Care [facility]) - Progression Note    Patient Details  Name: MAEBRY MITNICK MRN: [id number] Date of Birth: Nov 04, 1950  [facility] CM/SW Contact  Baldemar Lenis, [place] Phone Number: 12/09/2022, 3:33 PM  Clinical Narrative:   CSW received confirmation from [facility] that they can admit patient. CSW asked PT assigned to see patient for insurance authorization, asked CMA to initiate authorization request. Authorization is still pending at this time. CSW spoke with [facility], they are able to admit tomorrow if Berkley Harvey is approved. CSW to follow.    Expected Discharge Plan: Skilled Nursing Facility Barriers to Discharge: Continued Medical Work up, English as a second language teacher  Expected Discharge Plan and Services     Post Acute Care Choice: Skilled Nursing Facility Living arrangements for the past 2 months: Single Family Home                                       Social Determinants of Health (SDOH) Interventions SDOH Screenings   Food Insecurity: No Food Insecurity (08/19/2022)  Housing: Low Risk  (08/19/2022)  Transportation Needs: No Transportation Needs (08/19/2022)  Utilities: Not At Risk (08/19/2022)  Tobacco Use: Low Risk  (12/04/2022)    Readmission Risk Interventions     No data to display

## 2022-12-09 NOTE — Progress Notes (Addendum)
STROKE TEAM PROGRESS NOTE   BRIEF HPI [name] is a 72 y.o. 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 Her caregiver had come to check on the patient after attempting to contact her by phone with no answer .  SIGNIFICANT HOSPITAL EVENTS 3/18- discharged from [facility] 3/22- ED [facility] as code stroke and sent to [facility] 6/28- admit to [facility], transferred from [facility] 6/30- transfer out of ICU 6/29-MRI brain large evolving right MCA infarct with hemorrhagic transformation.  Mild cytotoxic edema with 4 mm right to left shift. 6/29-MR angiogram brain shows right M3 occlusion and chronic distal left P3 stenosis/occlusion.  Chronic left ICA occlusion.  Moderate to severe right A1 and left A2 stenosis  INTERIM HISTORY/SUBJECTIVE Patient is awake, oriented to person but not place and time.  She remains hemodynamically stable.  She is awaiting SNF placement. SLP at the bedside, diet now advanced to dysphagia 3 thin diet .  OBJECTIVE  CBC    Component Value Date/Time   WBC 6.4 12/04/2022 1753   RBC 4.51 12/04/2022 1753   HGB 12.1 12/04/2022 1753   HGB 8.3 (L) 02/05/2012 0833   HCT 40.6 12/04/2022 1753   HCT 26.8 (L) 01/25/2012 0837   PLT 271 12/04/2022 1753   PLT 227 02/03/2012 0426   MCV 90.0 12/04/2022 1753   MCV 70 (L) 01/25/2012 0837   MCH 26.8 12/04/2022 1753   MCHC 29.8 (L) 12/04/2022 1753   RDW 17.3 (H) 12/04/2022 1753   RDW 17.8 (H) 01/25/2012 0837   LYMPHSABS 1.6 08/28/2022 1637   MONOABS 1.0 08/28/2022 1637   EOSABS 0.1 08/28/2022 1637   BASOSABS 0.0 08/28/2022 1637    BMET    Component  Value Date/Time   NA 142 12/09/2022 0450   NA 144 02/03/2012 0426   K 3.9 12/09/2022 0450   K 4.4 02/03/2012 0426   CL 113 (H) 12/09/2022 0450   CL 113 (H) 02/03/2012 0426   CO2 21 (L) 12/09/2022 0450   CO2 24 02/03/2012 0426   GLUCOSE 93 12/09/2022 0450   GLUCOSE 153 (H) 02/03/2012 0426   BUN 27 (H) 12/09/2022 0450   BUN 22 (H) 02/03/2012 0426   CREATININE 1.64 (H) 12/09/2022 0450   CREATININE 1.76 (H) 08/18/2013 1019   CALCIUM 8.4 (L) 12/09/2022 0450   CALCIUM 8.1 (L) 02/03/2012 0426   GFRNONAA 33 (L) 12/09/2022 0450   GFRNONAA 30 (L) 08/18/2013 1019    IMAGING past 24 hours No results found.  Vitals:   12/08/22 2315 12/09/22 0354 12/09/22 0900 12/09/22 1131  BP: 136/60 130/74 (!) 140/97 (!) 142/61  Pulse: 60 (!) 57 (!) 58 66  Resp: 16 16 18 18   Temp: 97.9 F (36.6 C) 98 F (36.7 C) 97.9 F (36.6 C) 98 F (36.7 C)  TempSrc: Axillary  Oral Oral  SpO2: 99% 97% 98% 100%     PHYSICAL EXAM General: Drowsy, well-nourished, well-developed elderly patient in no acute distress Psych:  Mood and affect appropriate for situation CV: Regular rate and rhythm on monitor Respiratory:  Regular, unlabored respirations on room air GI: Abdomen soft and nontender   NEURO:  Mental Status:  Opens eyes to verbal stimuli. Follows commands bilaterally.  Alert and oriented to self only Speech/Language: Speech is without dysarthria  Cranial Nerves:  III, IV, VI: Extraocular movements intact VII: Face is symmetrical resting  VIII: hearing intact to voice. IX, X: Phonation is normal.  XII: tongue is midline without fasciculations. Motor: Moves right upper extremity and right lower extremity more briskly than the left side. Tone: is normal and bulk is normal Sensation-appears intact to light touch Coordination: Does not complete Gait- deferred   ASSESSMENT/PLAN  Acute Ischemic Infarct: Subacute infarct in the right temporal lobe with hemorrhagic transformation Etiology:  Cardioembolic likely Code Stroke CT head - Subacute infarct in the right temporal lobe, extending toward the right temporal occipital region, with areas of hemorrhage, most likely hemorrhagic transformation. This is associated with local mass effect and 3 mm of right to left midline shift. MRI-hemorrhagic right MCA infarct.  Mild cytotoxic edema.  4 mm right to left shift.  Subacute left PCA infarct. Carotid Doppler: 1 to 39% stenosis in right carotid artery, thumping waveforms and severely diminished diastolic waveforms in left carotid indicative of more proximal nearly complete occlusion 2D Echo EF 45 to 50%, severe asymmetric LVH of the septal segment, grade 1 diastolic dysfunction, left atrium and right atrium mildly dilated, no atrial level shunt LDL 75 HgbA1c 7.7 Patient will need 30-day cardiac monitor post discharge, as results from previous monitor are not available VTE prophylaxis -SCDs aspirin 81 mg daily and clopidogrel 75 mg daily prior to admission, now on aspirin 81 mg daily Therapy recommendations: SNF Disposition: Pending  Hx of Stroke/TIA March 2024 - PCA infarction with large hemorrhagic transformation  Treated at [facility] long-term cardiac monitor, however we do not have results for it at this time. July 2023-acute infarct involving the cortical/subcortical right frontal lobe  Hypertension Home meds: Losartan, furosemide Stable off Cleviprex As needed medications-labetalol, hydralazine Blood Pressure Goal: SBP between 130-150 for 24 hours and then less than 160   Hyperlipidemia Home meds: Atorvastatin 80 mg, resumed in hospital LDL 75, goal < 70 Continue statin at discharge  Diabetes type II Uncontrolled Home meds: None HgbA1c 7.7, goal < 7.0 CBGs SSI Recommend close follow-up with PCP for better DM control  Dysphagia Patient has post-stroke dysphagia, SLP consulted    Diet   DIET DYS 3 Room service appropriate? Yes with Assist; Fluid consistency: Thin    Advance diet as tolerated  Other Stroke Risk Factors Coronary artery disease S/p CABG Congestive heart failure  Other Active Problems CKD Cr 1.6 GFR 34 Hashimoto's Synthroid 200 mg Monday Wednesday Friday  Hospital day # 5  Patient seen and examined by NP/APP with MD. MD to update note as needed.   Elmer Picker, DNP, FNP-BC [facility] Pager: [phone] I have personally obtained history,examined this patient, reviewed notes, independently viewed imaging studies, participated in medical decision making and plan of care.ROS completed by me personally and pertinent positives fully documented  I have made any additions or clarifications directly to the above note. Agree with note above.  Diet advanced to dysphagia 3 diet.  Patient medically stable to be transferred to skilled nursing facility when bed available.  Delia Heady, MD Medical Director [facility] Pager: [phone] 12/09/2022 3:39 PM  To contact Stroke Continuity provider, please refer to [email or web address]. After hours, contact General Neurology

## 2022-12-10 DIAGNOSIS — I63511 Cerebral infarction due to unspecified occlusion or stenosis of right middle cerebral artery: Principal | ICD-10-CM

## 2022-12-10 LAB — GLUCOSE, CAPILLARY
Glucose-Capillary: 101 mg/dL — ABNORMAL HIGH (ref 70–99)
Glucose-Capillary: 106 mg/dL — ABNORMAL HIGH (ref 70–99)
Glucose-Capillary: 111 mg/dL — ABNORMAL HIGH (ref 70–99)
Glucose-Capillary: 127 mg/dL — ABNORMAL HIGH (ref 70–99)

## 2022-12-10 NOTE — Plan of Care (Signed)

## 2022-12-10 NOTE — Progress Notes (Signed)
Speech Language Pathology Treatment: Dysphagia  Patient Details Name: [name] MRN: [id number] DOB: 1951/02/18 Today's Date: 12/10/2022 Time: 4098-1191 SLP Time Calculation (min) (ACUTE ONLY): 13 min  Assessment / Plan / Recommendation Clinical Impression  Pt today seen for dysphagia management.  She was greeted with her eyes closed but did wake adequately for some po intake.   Very poor attention noted today with pt needing total cues after SLP removed her mitts *replaced at end of session* for her to refrain from trying to get OOB.  Repeatedly stating she has to "pee" - informed her repeatedly to her having a catheter and it not being safe for her to get OOB due to fall risk.  She is dysarthric today - repeating "I may aspirate" and "fall" otherwise not expanding on information.   SLP questions if she is having discomfort that is contributing to her impaired attention.  She did consume sequential boluses of water via straw - no clinical indication of aspiration despite large boluses and pt gradually sliding down in bed during intake.  She only took 2 minute size bites of banana - with adequate mastication = rotary = and adequate clearance.  Pt then refused any more po and RN reports he intake was poor.  SLP turned on music in hopes to relax pt but she continued to try to get OOB - informed RN *who reached out to MD.  Will continue efforts - recommend continue diet with full assist for meals.    HPI HPI: [name] is an 72 y.o. 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 Her caregiver had come to check on the patient  after attempting to contact her by phone with no answer. She was found on the floor with AMS present; a strong odor of urine and feces was noted. She was unable to get up off the ground with caretaker assistance and EMS was called. It was estimated that the patient had possibly been down for about 10-15 hrs. 12/04/22, CT head was obtained, revealing a subacute infarct in the right temporal lobe, extending toward the right temporal occipital region, with areas of hemorrhage, most likely hemorrhagic transformation. This was associated with local mass effect and 3 mm of right to left midline shift. Also noted was a remote infarct in the left parietal, occipital, and temporal lobes, with resolution of previously noted hyperdense hemorrhage; ST consulted for clinical swallowing evaluation/speech/language assessment; placed on Dysphagia 2/thin liquid diet; ST f/u for diet tolerance/dysphagia tx.      SLP Plan  Continue with current plan of care      Recommendations for follow up therapy are one component of a multi-disciplinary discharge planning process, led by the attending physician.  Recommendations may be updated based on patient status, additional functional criteria and insurance authorization.    Recommendations  Diet recommendations: Dysphagia 3 (mechanical soft);Thin liquid Liquids provided via: Cup;Straw Medication Administration:  (with puree - whole vs crushed -  defer to rn) Supervision: Staff to assist with self feeding Compensations: Slow rate;Small sips/bites;Other (Comment) (check for oral retention) Postural Changes and/or Swallow Maneuvers: Seated upright 90 degrees;Upright 30-60 min after meal  Oral care BID   Frequent or constant Supervision/Assistance Dysphagia, unspecified (R13.10)     Continue with current plan of care    Rolena Infante, MS [facility] SLP Acute Rehab Services Office [phone]  Chales Abrahams  12/10/2022, 10:35 AM

## 2022-12-10 NOTE — Progress Notes (Signed)
1335: attempted to call report to [facility], no answer-VM left 1425: attempted to call report to [facility], no answer-VM left 1519: report given to nurse Marcelino Duster at [facility]

## 2022-12-10 NOTE — Discharge Summary (Addendum)
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 Principal Problem:   Stroke (cerebrum) (HCC)   Allergies as of 12/10/2022       Reactions   Niacin Other (See Comments)   Heart Attack Other reaction(s): Other (see comments) Heart Attack   Tramadol    Other reaction(s): Other (see comments) Intolerance, makes woozy   Ultram [tramadol Hcl]    Intolerance, makes woozy   Baclofen Other (See Comments)   Dizziness Other reaction(s): Other (see comments) Dizziness   Cardura [doxazosin Mesylate] Other (See Comments)   Dizzy with passing out spells   Doxazosin    Other reaction(s): Other (see comments) Dizzy with passing out spells Dizzy with passing out spells   Morphine And Codeine Nausea And Vomiting, Nausea Only   Other Rash   Gold metal (nickel in the gold) Gold metal Gold metal (nickel in the gold)        Medication List     STOP taking these medications    clopidogrel 75 MG tablet Commonly known as: PLAVIX   diphenhydrAMINE 50 MG capsule Commonly known as: BENADRYL   multivitamin with minerals Tabs tablet       TAKE these medications    (feeding supplement) PROSource Plus liquid Take 30 mLs by mouth 3 (three) times daily between meals.   allopurinol 100 MG tablet Commonly known as: ZYLOPRIM Take 100 mg by mouth daily.   aspirin EC 81 MG tablet Take 1 tablet (81 mg total) by mouth daily.   atorvastatin 80 MG tablet Commonly known as: LIPITOR Take 1 tablet (80 mg total) by mouth at bedtime. What changed: Another medication with the same name was removed. Continue taking this medication, and follow the directions you see here.   blood  glucose meter kit and supplies Kit Dispense based on patient and insurance preference. Use up to four times daily as directed.   calcitRIOL 0.25 MCG capsule Commonly known as: ROCALTROL Take 0.25 mcg by mouth daily.   cyanocobalamin 1000 MCG tablet Commonly known as: VITAMIN B12 Take 1,000 mcg by mouth daily.   furosemide 40 MG tablet Commonly known as: LASIX Take 1 tablet (40 mg total) by mouth daily as needed for fluid.   Jardiance 10 MG Tabs tablet Generic drug: empagliflozin Take 10 mg by mouth daily.   levothyroxine 200 MCG tablet Commonly known as: SYNTHROID Take 200 mcg by mouth See admin instructions. Take one tablet by mouth Monday through Fridays only. Do not take any on weekends per caregiver   losartan 50 MG tablet Commonly known as: COZAAR Take 1 tablet (50 mg total) by mouth daily. What changed: Another medication with the same name was removed. Continue taking this medication, and follow the directions you see here.   pantoprazole 20 MG tablet Commonly known as: PROTONIX Take 20 mg by mouth daily.   potassium chloride 10 MEQ tablet Commonly known as: KLOR-CON Take 2 tablets by mouth daily.   senna-docusate 8.6-50 MG tablet Commonly known as: Senokot-S Take 1 tablet by mouth at bedtime as needed for moderate constipation or mild constipation.   sertraline 25 MG tablet Commonly known as: ZOLOFT Take 1 tablet by mouth at bedtime.  LABORATORY STUDIES CBC    Component Value Date/Time   WBC 6.4 12/04/2022 1753   RBC 4.51 12/04/2022 1753   HGB 12.1 12/04/2022 1753   HGB 8.3 (L) 02/05/2012 0833   HCT 40.6 12/04/2022 1753   HCT 26.8 (L) 01/25/2012 0837   PLT 271 12/04/2022 1753   PLT 227 02/03/2012 0426   MCV 90.0 12/04/2022 1753   MCV 70 (L) 01/25/2012 0837   MCH 26.8 12/04/2022 1753   MCHC 29.8 (L) 12/04/2022 1753   RDW 17.3 (H) 12/04/2022 1753   RDW 17.8 (H) 01/25/2012 0837   LYMPHSABS 1.6 08/28/2022 1637   MONOABS 1.0 08/28/2022 1637    EOSABS 0.1 08/28/2022 1637   BASOSABS 0.0 08/28/2022 1637   CMP    Component Value Date/Time   NA 142 12/09/2022 0450   NA 144 02/03/2012 0426   K 3.9 12/09/2022 0450   K 4.4 02/03/2012 0426   CL 113 (H) 12/09/2022 0450   CL 113 (H) 02/03/2012 0426   CO2 21 (L) 12/09/2022 0450   CO2 24 02/03/2012 0426   GLUCOSE 93 12/09/2022 0450   GLUCOSE 153 (H) 02/03/2012 0426   BUN 27 (H) 12/09/2022 0450   BUN 22 (H) 02/03/2012 0426   CREATININE 1.64 (H) 12/09/2022 0450   CREATININE 1.76 (H) 08/18/2013 1019   CALCIUM 8.4 (L) 12/09/2022 0450   CALCIUM 8.1 (L) 02/03/2012 0426   PROT 6.6 08/28/2022 1637   ALBUMIN 3.1 (L) 08/28/2022 1637   AST 18 08/28/2022 1637   ALT 13 08/28/2022 1637   ALKPHOS 126 08/28/2022 1637   BILITOT 0.6 08/28/2022 1637   GFRNONAA 33 (L) 12/09/2022 0450   GFRNONAA 30 (L) 08/18/2013 1019   GFRAA 31 (L) 07/19/2019 0211   GFRAA 35 (L) 08/18/2013 1019   COAGS Lab Results  Component Value Date   INR 1.1 08/28/2022   INR 1.2 08/18/2022   INR 1.1 05/19/2022   Lipid Panel    Component Value Date/Time   CHOL 147 12/05/2022 0934   TRIG 97 12/05/2022 0934   HDL 58 12/05/2022 0934   CHOLHDL 2.5 12/05/2022 0934   VLDL 19 12/05/2022 0934   LDLCALC 70 12/05/2022 0934   HgbA1C  Lab Results  Component Value Date   HGBA1C 7.7 (H) 08/19/2022   Urinalysis    Component Value Date/Time   COLORURINE YELLOW (A) 12/18/2021 0012   APPEARANCEUR TURBID (A) 12/18/2021 0012   LABSPEC 1.012 12/18/2021 0012   PHURINE 5.0 12/18/2021 0012   GLUCOSEU NEGATIVE 12/18/2021 0012   HGBUR NEGATIVE 12/18/2021 0012   BILIRUBINUR NEGATIVE 12/18/2021 0012   BILIRUBINUR negative 03/27/2021 1732   KETONESUR NEGATIVE 12/18/2021 0012   PROTEINUR 30 (A) 12/18/2021 0012   UROBILINOGEN 0.2 03/27/2021 1732   UROBILINOGEN 0.2 07/25/2007 0910   NITRITE NEGATIVE 12/18/2021 0012   LEUKOCYTESUR LARGE (A) 12/18/2021 0012   Urine Drug Screen No results found for: "LABOPIA", "COCAINSCRNUR",  "LABBENZ", "AMPHETMU", "THCU", "LABBARB"  Alcohol Level    Component Value Date/Time   ETH <10 08/28/2022 1637     SIGNIFICANT DIAGNOSTIC STUDIES VAS US CAROTID  Result Date: 12/08/2022 Carotid Arterial Duplex Study Patient Name:  [name]  Date of Exam:   12/06/2022 Medical Rec #: [id number]       Accession #:    [id number] Date of Birth: 02/09/1951       Patient Gender: F Patient Age:   37 years Exam Location:  [facility] Procedure:      VAS US CAROTID Referring  Phys: Elmer Picker --------------------------------------------------------------------------------  Indications:       CVA. Risk Factors:      Hypertension, Diabetes, coronary artery disease, PAD. Other Factors:     Heart failure. Limitations        Today's exam was limited due to heavy calcification and the                    resulting shadowing. Comparison Study:  Prior carotid duplex done at [facility] done 08/18/22 Performing Technologist: Sherren Kerns RVS  Examination Guidelines: A complete evaluation includes B-mode imaging, spectral Doppler, color Doppler, and power Doppler as needed of all accessible portions of each vessel. Bilateral testing is considered an integral part of a complete examination. Limited examinations for reoccurring indications may be performed as noted.  Right Carotid Findings: +----------+--------+--------+--------+----------------------+--------+           PSV cm/sEDV cm/sStenosisPlaque Description    Comments +----------+--------+--------+--------+----------------------+--------+ CCA Prox  50      13                                             +----------+--------+--------+--------+----------------------+--------+ CCA Distal59      17                                             +----------+--------+--------+--------+----------------------+--------+ ICA Prox  81      24      1-39%   calcific and irregular          +----------+--------+--------+--------+----------------------+--------+ ICA Mid   112     42                                             +----------+--------+--------+--------+----------------------+--------+ ICA Distal79      23                                             +----------+--------+--------+--------+----------------------+--------+ ECA       140     6                                              +----------+--------+--------+--------+----------------------+--------+ +----------+--------+-------+--------+-------------------+           PSV cm/sEDV cmsDescribeArm Pressure (mmHG) +----------+--------+-------+--------+-------------------+ Subclavian102                                        +----------+--------+-------+--------+-------------------+ +---------+--------+--+--------+--+ VertebralPSV cm/s50EDV cm/s10 +---------+--------+--+--------+--+  Left Carotid Findings: +----------+--------+--------+--------+------------------+-------------------+           PSV cm/sEDV cm/sStenosisPlaque DescriptionComments            +----------+--------+--------+--------+------------------+-------------------+ CCA Prox  72      0                                                     +----------+--------+--------+--------+------------------+-------------------+  CCA Distal69      3                                 intimal thickening  +----------+--------+--------+--------+------------------+-------------------+ ICA Prox  29      1               calcific          "thumping waveform" +----------+--------+--------+--------+------------------+-------------------+ ICA Mid   55      1                                 "thumping waveform" +----------+--------+--------+--------+------------------+-------------------+ ICA Distal80      3                                 "thumping waveform"  +----------+--------+--------+--------+------------------+-------------------+ ECA       121     3                                                     +----------+--------+--------+--------+------------------+-------------------+ +----------+--------+--------+--------+-------------------+           PSV cm/sEDV cm/sDescribeArm Pressure (mmHG) +----------+--------+--------+--------+-------------------+ [id number]                                          +----------+--------+--------+--------+-------------------+ +---------+--------+--+--------+--+ VertebralPSV cm/s53EDV cm/s15 +---------+--------+--+--------+--+   Summary: Right Carotid: Velocities in the right ICA are consistent with a 1-39% stenosis. Left Carotid: "Thumping" waveforms and severely diminished diastolic waveforms               throughout the ICA are indicative of a more proximal, complete to               nearly complete, occlusion. Vertebrals:  Bilateral vertebral arteries demonstrate antegrade flow. Subclavians: Normal flow hemodynamics were seen in bilateral subclavian              arteries. *See table(s) above for measurements and observations.  Electronically signed by Delia Heady MD on 12/08/2022 at 2:02:30 PM.    Final    ECHOCARDIOGRAM COMPLETE  Result Date: 12/06/2022    ECHOCARDIOGRAM REPORT   Patient Name:   [name] Date of Exam: 12/06/2022 Medical Rec #:  [id number]      Height:       66.0 in Accession #:    [id number]     Weight:       169.0 lb Date of Birth:  Jan 06, 1951      BSA:          1.862 m Patient Age:    71 years       BP:           150/72 mmHg Patient Gender: F              HR:           56 bpm. Exam Location:  Inpatient Procedure: 2D Echo, Color Doppler and Cardiac Doppler Indications:    Stroke i63.9  History:        Patient has prior history of Echocardiogram examinations, most  recent 08/20/2022. Prior CABG; Risk Factors:Hypertension,                 Diabetes and Dyslipidemia.   Sonographer:    Irving Burton Senior RDCS Referring Phys: [id number] DEVON SHAFER  Sonographer Comments: Scanned upright due to aspiration precautions. IMPRESSIONS  1. Left ventricular ejection fraction, by estimation, is 45 to 50%. The left ventricle has mildly decreased function. The left ventricle demonstrates regional wall motion abnormalities (see scoring diagram/findings for description). There is severe asymmetric left ventricular hypertrophy of the septal segment. Left ventricular diastolic parameters are consistent with Grade I diastolic dysfunction (impaired relaxation).  2. Right ventricular systolic function is normal. The right ventricular size is normal. Tricuspid regurgitation signal is inadequate for assessing PA pressure.  3. Left atrial size was mildly dilated.  4. Right atrial size was mildly dilated.  5. The mitral valve is grossly normal. Mild mitral valve regurgitation. No evidence of mitral stenosis.  6. The aortic valve has an indeterminant number of cusps. Aortic valve regurgitation is trivial. No aortic stenosis is present.  7. Aortic dilatation noted. There is mild dilatation of the ascending aorta, measuring 40 mm.  8. The inferior vena cava is normal in size with greater than 50% respiratory variability, suggesting right atrial pressure of 3 mmHg. Comparison(s): Changes from prior study are noted. LVEF improved from 35-40% in 08/2022 to 45-50% now. FINDINGS  Left Ventricle: Left ventricular ejection fraction, by estimation, is 45 to 50%. The left ventricle has mildly decreased function. The left ventricle demonstrates regional wall motion abnormalities. The left ventricular internal cavity size was normal in size. There is severe asymmetric left ventricular hypertrophy of the septal segment. Left ventricular diastolic parameters are consistent with Grade I diastolic dysfunction (impaired relaxation).  LV Wall Scoring: The entire inferior wall, posterior wall, and apex are hypokinetic. Right  Ventricle: The right ventricular size is normal. No increase in right ventricular wall thickness. Right ventricular systolic function is normal. Tricuspid regurgitation signal is inadequate for assessing PA pressure. Left Atrium: Left atrial size was mildly dilated. Right Atrium: Right atrial size was mildly dilated. Pericardium: There is no evidence of pericardial effusion. Mitral Valve: The mitral valve is grossly normal. Mild mitral valve regurgitation. No evidence of mitral valve stenosis. Tricuspid Valve: The tricuspid valve is normal in structure. Tricuspid valve regurgitation is trivial. No evidence of tricuspid stenosis. Aortic Valve: The aortic valve has an indeterminant number of cusps. Aortic valve regurgitation is trivial. No aortic stenosis is present. Pulmonic Valve: The pulmonic valve was normal in structure. Pulmonic valve regurgitation is trivial. No evidence of pulmonic stenosis. Aorta: The aortic root is normal in size and structure and aortic dilatation noted. There is mild dilatation of the ascending aorta, measuring 40 mm. Venous: The inferior vena cava is normal in size with greater than 50% respiratory variability, suggesting right atrial pressure of 3 mmHg. IAS/Shunts: No atrial level shunt detected by color flow Doppler.  LEFT VENTRICLE PLAX 2D LVIDd:         4.60 cm      Diastology LVIDs:         3.40 cm      LV e' medial:    2.94 cm/s LV PW:         1.20 cm      LV E/e' medial:  19.0 LV IVS:        2.30 cm      LV e' lateral:   3.92 cm/s LVOT diam:  2.00 cm      LV E/e' lateral: 14.2 LV SV:         61 LV SV Index:   33 LVOT Area:     3.14 cm  LV Volumes (MOD) LV vol d, MOD A2C: 147.0 ml LV vol d, MOD A4C: 125.0 ml LV vol s, MOD A2C: 76.7 ml LV vol s, MOD A4C: 71.7 ml LV SV MOD A2C:     70.3 ml LV SV MOD A4C:     125.0 ml LV SV MOD BP:      62.6 ml RIGHT VENTRICLE RV S prime:     11.30 cm/s LEFT ATRIUM             Index        RIGHT ATRIUM           Index LA diam:        4.60 cm 2.47  cm/m   RA Area:     22.60 cm LA Vol (A2C):   80.5 ml 43.24 ml/m  RA Volume:   68.20 ml  36.63 ml/m LA Vol (A4C):   62.6 ml 33.63 ml/m LA Biplane Vol: 74.3 ml 39.91 ml/m  AORTIC VALVE LVOT Vmax:   91.80 cm/s LVOT Vmean:  68.600 cm/s LVOT VTI:    0.193 m  AORTA Ao Root diam: 3.20 cm Ao Asc diam:  4.00 cm MITRAL VALVE MV Area (PHT): 2.72 cm     SHUNTS MV Decel Time: 279 msec     Systemic VTI:  0.19 m MV E velocity: 55.80 cm/s   Systemic Diam: 2.00 cm MV A velocity: 106.00 cm/s MV E/A ratio:  0.53 Vishnu Priya Mallipeddi Electronically signed by Winfield Rast Mallipeddi Signature Date/Time: 12/06/2022/2:54:25 PM    Final    MR BRAIN WO CONTRAST  Result Date: 12/05/2022 CLINICAL DATA:  Follow-up examination for stroke. EXAM: MRI HEAD WITHOUT CONTRAST MRA HEAD WITHOUT CONTRAST TECHNIQUE: Multiplanar, multi-echo pulse sequences of the brain and surrounding structures were acquired without intravenous contrast. Angiographic images of the Circle of Willis were acquired using MRA technique without intravenous contrast. COMPARISON:  Prior study from 12/04/2022 and earlier. FINDINGS: MRI HEAD FINDINGS Brain: Mild age-related cerebral atrophy with chronic small vessel ischemic disease. Small remote right cerebellar infarct. Evolving late subacute to chronic infarct involving the left temporal occipital region. Areas of intrinsic T1 hyperintensity likely reflect evolving laminar necrosis. No visible hemorrhage on prior CT. No significant mass effect. Additional large evolving acute to early subacute right MCA territory infarct involving the right temporal occipital region, with involvement of the right insula/external capsule. Superimpose susceptibility artifact consistent with hemorrhagic transformation, corresponding with abnormality on prior CT. Localized edema and regional mass effect, with 4 mm of right-to-left shift. Mild ex vacuo dilatation of the left lateral ventricle related to the chronic left cerebral  encephalomalacia. No hydrocephalus or trapping. Basilar cisterns remain patent. No other evidence for acute or subacute ischemia. No other acute or chronic intracranial blood products. No mass lesion or extra-axial fluid collection. Pituitary gland suprasellar region within normal limits. Vascular: Loss of normal flow void within the left ICA to the terminus, consistent with previously identified occlusion. Major intracranial vascular flow voids are otherwise maintained. Skull and upper cervical spine: Craniocervical junction within normal limits. Bone marrow signal intensity normal. No scalp soft tissue abnormality. Sinuses/Orbits: Globes orbital soft tissues within normal limits. Paranasal sinuses are largely clear. No significant mastoid effusion. Other: None. MRA HEAD FINDINGS Anterior circulation: Left ICA remains left A1 segment  patent. Focal moderate to severe right A1 stenosis (series 853, image 14). Normal anterior communicating complex. Right ACA widely patent. Severe left A2 stenosis noted (series 8, image 123). Right M1 segment widely patent. There is a distal right M3 occlusion, in keeping with the evolving right MCA territory infarct (series 8, image 139). Remainder of the right MCA branches remain patent. Left M1 segment. Previously seen left M2 occlusion is not definitely seen on this exam. Distal left MCA branches are attenuated but patent. Posterior circulation: Both V4 segments patent without stenosis. Left PICA patent. Right PICA origin not seen. Mild atheromatous irregularity about the basilar without high-grade stenosis. Superior cerebral arteries patent bilaterally. Left PCA supplied via the basilar. Right PCA supplied via a hypoplastic right P1 segment and robust right posterior communicating. Right PCA widely patent to its distal aspect. Chronic distal left P3 occlusion (series 856, image 12). Additional severe left P3 stenosis (series 856, image 16). Anatomic variants: As above.  No  aneurysm. IMPRESSION: MRI HEAD IMPRESSION: 1. Large evolving acute to early subacute right MCA territory infarct with hemorrhagic transformation. Localized edema and regional mass effect, with 4 mm of right-to-left shift. 2. Evolving late subacute to chronic infarct involving the left temporal occipital region. 3. Underlying mild age-related cerebral atrophy with chronic small vessel ischemic disease, with a small remote right cerebellar infarct. MRA HEAD IMPRESSION: 1. Acute right M3 occlusion, in keeping with the evolving right MCA territory infarct. 2. 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 Previously noted left M2 occlusion is not seen on this exam, presumably recanalized in the interim. 3. 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 4. Moderate to severe right A1 and left A2 stenoses, stable. Electronically Signed   By: Rise Mu M.D.   On: 12/05/2022 20:05   MR ANGIO HEAD WO CONTRAST  Result Date: 12/05/2022 CLINICAL DATA:  Follow-up examination for stroke. EXAM: MRI HEAD WITHOUT CONTRAST MRA HEAD WITHOUT CONTRAST TECHNIQUE: Multiplanar, multi-echo pulse sequences of the brain and surrounding structures were acquired without intravenous contrast. Angiographic images of the Circle of Willis were acquired using MRA technique without intravenous contrast. COMPARISON:  Prior study from 12/04/2022 and earlier. FINDINGS: MRI HEAD FINDINGS Brain: Mild age-related cerebral atrophy with chronic small vessel ischemic disease. Small remote right cerebellar infarct. Evolving late subacute to chronic infarct involving the left temporal occipital region. Areas of intrinsic T1 hyperintensity likely reflect evolving laminar necrosis. No visible hemorrhage on prior CT. No significant mass effect. Additional large evolving acute to early subacute right MCA territory infarct involving the right temporal occipital region, with involvement of the right insula/external capsule. Superimpose  susceptibility artifact consistent with hemorrhagic transformation, corresponding with abnormality on prior CT. Localized edema and regional mass effect, with 4 mm of right-to-left shift. Mild ex vacuo dilatation of the left lateral ventricle related to the chronic left cerebral encephalomalacia. No hydrocephalus or trapping. Basilar cisterns remain patent. No other evidence for acute or subacute ischemia. No other acute or chronic intracranial blood products. No mass lesion or extra-axial fluid collection. Pituitary gland suprasellar region within normal limits. Vascular: Loss of normal flow void within the left ICA to the terminus, consistent with previously identified occlusion. Major intracranial vascular flow voids are otherwise maintained. Skull and upper cervical spine: Craniocervical junction within normal limits. Bone marrow signal intensity normal. No scalp soft tissue abnormality. Sinuses/Orbits: Globes orbital soft tissues within normal limits. Paranasal sinuses are largely clear. No significant mastoid effusion. Other: None. MRA HEAD FINDINGS Anterior circulation: Left ICA  remains left A1 segment patent. Focal moderate to severe right A1 stenosis (series 853, image 14). Normal anterior communicating complex. Right ACA widely patent. Severe left A2 stenosis noted (series 8, image 123). Right M1 segment widely patent. There is a distal right M3 occlusion, in keeping with the evolving right MCA territory infarct (series 8, image 139). Remainder of the right MCA branches remain patent. Left M1 segment. Previously seen left M2 occlusion is not definitely seen on this exam. Distal left MCA branches are attenuated but patent. Posterior circulation: Both V4 segments patent without stenosis. Left PICA patent. Right PICA origin not seen. Mild atheromatous irregularity about the basilar without high-grade stenosis. Superior cerebral arteries patent bilaterally. Left PCA supplied via the basilar. Right PCA supplied  via a hypoplastic right P1 segment and robust right posterior communicating. Right PCA widely patent to its distal aspect. Chronic distal left P3 occlusion (series 856, image 12). Additional severe left P3 stenosis (series 856, image 16). Anatomic variants: As above.  No aneurysm. IMPRESSION: MRI HEAD IMPRESSION: 1. Large evolving acute to early subacute right MCA territory infarct with hemorrhagic transformation. Localized edema and regional mass effect, with 4 mm of right-to-left shift. 2. Evolving late subacute to chronic infarct involving the left temporal occipital region. 3. Underlying mild age-related cerebral atrophy with chronic small vessel ischemic disease, with a small remote right cerebellar infarct. MRA HEAD IMPRESSION: 1. Acute right M3 occlusion, in keeping with the evolving right MCA territory infarct. 2. 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 Previously noted left M2 occlusion is not seen on this exam, presumably recanalized in the interim. 3. 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 4. Moderate to severe right A1 and left A2 stenoses, stable. Electronically Signed   By: Rise Mu M.D.   On: 12/05/2022 20:05   DG Abd 1 View  Result Date: 12/05/2022 CLINICAL DATA:  Abnormal head CT EXAM: ABDOMEN - 1 VIEW COMPARISON:  CT 02/12/2019 FINDINGS: Surgical clips in the right upper quadrant. Nonobstructed gas pattern. Numerous leads over the central abdomen with presumed external device over left lower quadrant. Otherwise no unexpected metallic density foreign bodies are seen. IMPRESSION: Nonobstructed gas pattern. Electronically Signed   By: Jasmine Pang M.D.   On: 12/05/2022 18:31   DG CHEST PORT 1 VIEW  Result Date: 12/05/2022 CLINICAL DATA:  Abnormal brain CT EXAM: PORTABLE CHEST 1 VIEW COMPARISON:  04/07/2022 FINDINGS: Post sternotomy changes with chronic elevation right diaphragm. No acute airspace disease, pleural effusion or pneumothorax. Aortic  atherosclerosis. No unexpected metallic foreign bodies are seen. IMPRESSION: No active disease. Post sternotomy changes. Electronically Signed   By: Jasmine Pang M.D.   On: 12/05/2022 18:29   CT HEAD WO CONTRAST  Result Date: 12/04/2022 CLINICAL DATA:  Fall, altered mental status EXAM: CT HEAD WITHOUT CONTRAST CT CERVICAL SPINE WITHOUT CONTRAST TECHNIQUE: Multidetector CT imaging of the head and cervical spine was performed following the standard protocol without intravenous contrast. Multiplanar CT image reconstructions of the cervical spine were also generated. RADIATION DOSE REDUCTION: This exam was performed according to the departmental dose-optimization program which includes automated exposure control, adjustment of the mA and/or kV according to patient size and/or use of iterative reconstruction technique. COMPARISON:  08/28/2022 CT head, 04/07/2022 CT cervical spine FINDINGS: CT HEAD FINDINGS Brain: Hypodensity in the right temporal lobe, extending toward the right temporal occipital region, consistent with subacute infarct, with areas of hemorrhage, most likely hemorrhagic transformation. This is associated with local mass effect and 3 mm  of right to left midline shift. Additional hypodensity is noted in the left parietal, occipital, and temporal lobes, consistent with remote infarct, with resolution of previously noted hyperdense hemorrhage. No hydrocephalus.  No extra-axial collection.  No mass. Vascular: No hyperdense vessel. Atherosclerotic calcifications in the intracranial carotid and vertebral arteries. Skull: Negative for fracture or focal lesion. Sinuses/Orbits: No acute finding. Other: The mastoid air cells are well aerated. CT CERVICAL SPINE FINDINGS Alignment: No traumatic listhesis. Skull base and vertebrae: No acute fracture or suspicious osseous lesion. Soft tissues and spinal canal: No prevertebral fluid or swelling. No visible canal hematoma. Disc levels: Degenerative changes in the  cervical spine. Mild spinal canal stenosis at C3-C4. Upper chest: Negative. IMPRESSION: 1. Subacute infarct in the right temporal lobe, extending toward the right temporal occipital region, with areas of hemorrhage, most likely hemorrhagic transformation. This is associated with local mass effect and 3 mm of right to left midline shift. 2. Remote infarct in the left parietal, occipital, and temporal lobes, with resolution of previously noted hyperdense hemorrhage. 3. No acute fracture or traumatic listhesis in the cervical spine. These results were called by telephone at the time of interpretation on 12/04/2022 at 6:00 pm to provider [facility] , who verbally acknowledged these results. Electronically Signed   By: Wiliam Ke M.D.   On: 12/04/2022 18:01   CT Cervical Spine Wo Contrast  Result Date: 12/04/2022 CLINICAL DATA:  Fall, altered mental status EXAM: CT HEAD WITHOUT CONTRAST CT CERVICAL SPINE WITHOUT CONTRAST TECHNIQUE: Multidetector CT imaging of the head and cervical spine was performed following the standard protocol without intravenous contrast. Multiplanar CT image reconstructions of the cervical spine were also generated. RADIATION DOSE REDUCTION: This exam was performed according to the departmental dose-optimization program which includes automated exposure control, adjustment of the mA and/or kV according to patient size and/or use of iterative reconstruction technique. COMPARISON:  08/28/2022 CT head, 04/07/2022 CT cervical spine FINDINGS: CT HEAD FINDINGS Brain: Hypodensity in the right temporal lobe, extending toward the right temporal occipital region, consistent with subacute infarct, with areas of hemorrhage, most likely hemorrhagic transformation. This is associated with local mass effect and 3 mm of right to left midline shift. Additional hypodensity is noted in the left parietal, occipital, and temporal lobes, consistent with remote infarct, with resolution of previously noted  hyperdense hemorrhage. No hydrocephalus.  No extra-axial collection.  No mass. Vascular: No hyperdense vessel. Atherosclerotic calcifications in the intracranial carotid and vertebral arteries. Skull: Negative for fracture or focal lesion. Sinuses/Orbits: No acute finding. Other: The mastoid air cells are well aerated. CT CERVICAL SPINE FINDINGS Alignment: No traumatic listhesis. Skull base and vertebrae: No acute fracture or suspicious osseous lesion. Soft tissues and spinal canal: No prevertebral fluid or swelling. No visible canal hematoma. Disc levels: Degenerative changes in the cervical spine. Mild spinal canal stenosis at C3-C4. Upper chest: Negative. IMPRESSION: 1. Subacute infarct in the right temporal lobe, extending toward the right temporal occipital region, with areas of hemorrhage, most likely hemorrhagic transformation. This is associated with local mass effect and 3 mm of right to left midline shift. 2. Remote infarct in the left parietal, occipital, and temporal lobes, with resolution of previously noted hyperdense hemorrhage. 3. No acute fracture or traumatic listhesis in the cervical spine. These results were called by telephone at the time of interpretation on 12/04/2022 at 6:00 pm to provider [facility] , who verbally acknowledged these results. Electronically Signed   By: Wiliam Ke M.D.   On: 12/04/2022  18:01      HISTORY OF PRESENT ILLNESS [name] is a 72 y.o. 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 Her caregiver had come to check on the patient after attempting to contact  her by phone with no answer   3/18- discharged from [facility] 3/22- ED [facility] as code stroke and sent to [facility] 6/28- admit to [facility], transferred from [facility] 6/30- transfer out of ICU 6/29-MRI brain large evolving right MCA infarct with hemorrhagic transformation.  Mild cytotoxic edema with 4 mm right to left shift. 6/29-MR angiogram brain shows right M3 occlusion and chronic distal left P3 stenosis/occlusion.  Chronic left ICA occlusion.  Moderate to severe right A1 and left A2 stenosis  HOSPITAL COURSE  Acute Ischemic Infarct: Subacute infarct in the right temporal lobe with hemorrhagic transformation Etiology: Cardioembolic likely Code Stroke CT head - Subacute infarct in the right temporal lobe, extending toward the right temporal occipital region, with areas of hemorrhage, most likely hemorrhagic transformation. This is associated with local mass effect and 3 mm of right to left midline shift. MRI-hemorrhagic right MCA infarct.  Mild cytotoxic edema.  4 mm right to left shift.  Subacute left PCA infarct. Carotid Doppler: 1 to 39% stenosis in right carotid artery, thumping waveforms and severely diminished diastolic waveforms in left carotid indicative of more proximal nearly complete occlusion 2D Echo EF 45 to 50%, severe asymmetric LVH of the septal segment, grade 1 diastolic dysfunction, left atrium and right atrium mildly dilated, no atrial level shunt LDL 75 HgbA1c 7.7 Patient will need 30-day cardiac monitor post discharge, as results from previous monitor are not available VTE prophylaxis -SCDs aspirin 81 mg daily and clopidogrel 75 mg daily prior to admission, now on aspirin 81 mg daily Therapy recommendations: SNF Disposition: SNF  Hx of Stroke/TIA March 2024 - PCA infarction with large hemorrhagic transformation  Treated at [facility] long-term cardiac monitor, however we do not have results for it at this time. July 2023-acute infarct involving the cortical/subcortical right frontal lobe    Hypertension Home meds: Losartan, furosemide Stable off Cleviprex As needed medications-labetalol, hydralazine Blood Pressure Goal: SBP between 130-150 for 24 hours and then less than 160    Hyperlipidemia Home meds: Atorvastatin 80 mg, resumed in hospital LDL 75, goal < 70 Continue statin at discharge   Diabetes type II Uncontrolled Home meds: None HgbA1c 7.7, goal < 7.0 CBGs SSI Recommend close follow-up with PCP for better DM control   Dysphagia Patient has post-stroke dysphagia, SLP consulted Dysphagia level 3 diet  Other Stroke Risk Factors Coronary artery disease S/p CABG Congestive heart failure   Other Active Problems CKD Cr 1.6 GFR 34 Hashimoto's Synthroid 200 mg Monday Wednesday Friday   DISCHARGE EXAM Blood pressure (!) 131/97, pulse 66, temperature 98.1 F (36.7 C), temperature source Oral, resp. rate 19, SpO2 99 %.   PHYSICAL EXAM General: Drowsy, well-nourished, well-developed elderly patient in no acute distress Psych:  Mood and affect appropriate for situation CV: Regular rate and rhythm on monitor Respiratory:  Regular, unlabored respirations on room air GI: Abdomen soft and nontender    NEURO:  Mental Status: Opens eyes  spontaneously. Intermittently follows commands bilaterally.  Alert and oriented to self only Speech/Language: Speech is without dysarthria  Cranial Nerves:  III, IV, VI: Extraocular movements intact VII: Face is symmetrical resting  VIII: hearing intact to voice. IX, X: Phonation is normal.  XII: tongue is midline without fasciculations. Motor: Moves RUE/RLE more briskly than the left side. Tone: is normal and bulk is normal Sensation: appears intact to light touch Coordination: Does not complete Gait-: deferred  Discharge Diet       Diet   DIET DYS 3 Room service appropriate? Yes with Assist; Fluid consistency: Thin   liquids  DISCHARGE PLAN Disposition:  SNF aspirin 81 mg daily for secondary stroke  prevention Ongoing stroke risk factor control by Primary Care Physician at time of discharge Follow-up PCP Lauro Regulus, MD in 2 weeks. Follow-up in [facility] in 8 weeks, office to schedule an appointment.   35 minutes were spent preparing discharge.   Pt seen by Neuro NP/APP and later by MD. Note/plan to be edited by MD as needed.    Lynnae January, DNP, AGACNP-BC [facility] Please use AMION for contact information & EPIC for messaging.  I have personally obtained history,examined this patient, reviewed notes, independently viewed imaging studies, participated in medical decision making and plan of care.ROS completed by me personally and pertinent positives fully documented  I have made any additions or clarifications directly to the above note. Agree with note above.    Delia Heady, MD Medical Director [facility] Pager: [phone] 12/10/2022 1:00 PM

## 2022-12-10 NOTE — TOC Transition Note (Signed)
Transition of Care [facility]) - CM/SW Discharge Note   Patient Details  Name: [name] MRN: [id number] Date of Birth: [birth year]  Transition of Care [facility]) CM/SW Contact:  Baldemar Lenis, LCSW Phone Number: 12/10/2022, 1:20 PM   Clinical Narrative:   CSW received insurance approval for patient to admit to [facility]. CSW confirmed with [facility] that they are able to admit today. CSW updated Hilda Lias about discharge today, she is in agreement. CSW sent discharge summary to [facility]. Transport arranged with [facility] for next available.  Nurse to call report to [phone] room 609a.    Final next level of care: Skilled Nursing Facility Barriers to Discharge: Barriers Resolved   Patient Goals and CMS Choice CMS Medicare.gov Compare Post Acute Care list provided to:: Patient Represenative (must comment) Choice offered to / list presented to : [facility] POA / Guardian  Discharge Placement                Patient chooses bed at: [facility] Patient to be transferred to facility by: [facility] Name of family member notified: Hilda Lias Patient and family notified of of transfer: 12/10/22  Discharge Plan and Services Additional resources added to the After Visit Summary for       Post Acute Care Choice: Skilled Nursing Facility                               Social Determinants of Health (SDOH) Interventions SDOH Screenings   Food Insecurity: No Food Insecurity (08/19/2022)  Housing: Low Risk  (08/19/2022)  Transportation Needs: No Transportation Needs (08/19/2022)  Utilities: Not At Risk (08/19/2022)  Tobacco Use: Low Risk  (12/04/2022)     Readmission Risk Interventions     No data to display

## 2022-12-11 DIAGNOSIS — R410 Disorientation, unspecified: Secondary | ICD-10-CM

## 2022-12-11 DIAGNOSIS — E1165 Type 2 diabetes mellitus with hyperglycemia: Secondary | ICD-10-CM

## 2022-12-11 DIAGNOSIS — I5022 Chronic systolic (congestive) heart failure: Secondary | ICD-10-CM

## 2022-12-11 DIAGNOSIS — R5381 Other malaise: Secondary | ICD-10-CM

## 2022-12-11 DIAGNOSIS — R1310 Dysphagia, unspecified: Secondary | ICD-10-CM | POA: Diagnosis not present

## 2022-12-11 DIAGNOSIS — I639 Cerebral infarction, unspecified: Secondary | ICD-10-CM

## 2022-12-11 DIAGNOSIS — I25810 Atherosclerosis of coronary artery bypass graft(s) without angina pectoris: Secondary | ICD-10-CM

## 2022-12-11 DIAGNOSIS — I679 Cerebrovascular disease, unspecified: Secondary | ICD-10-CM

## 2023-01-14 DIAGNOSIS — E119 Type 2 diabetes mellitus without complications: Secondary | ICD-10-CM

## 2023-01-14 DIAGNOSIS — E785 Hyperlipidemia, unspecified: Secondary | ICD-10-CM | POA: Diagnosis not present

## 2023-01-14 DIAGNOSIS — D649 Anemia, unspecified: Secondary | ICD-10-CM | POA: Diagnosis not present

## 2023-01-14 DIAGNOSIS — I639 Cerebral infarction, unspecified: Secondary | ICD-10-CM | POA: Diagnosis not present

## 2023-01-14 DIAGNOSIS — I10 Essential (primary) hypertension: Secondary | ICD-10-CM

## 2023-01-14 DIAGNOSIS — R2689 Other abnormalities of gait and mobility: Secondary | ICD-10-CM

## 2023-01-14 DIAGNOSIS — G4700 Insomnia, unspecified: Secondary | ICD-10-CM

## 2023-01-14 DIAGNOSIS — M6281 Muscle weakness (generalized): Secondary | ICD-10-CM

## 2023-01-14 DIAGNOSIS — M1A9XX1 Chronic gout, unspecified, with tophus (tophi): Secondary | ICD-10-CM | POA: Diagnosis not present

## 2023-02-12 ENCOUNTER — Other Ambulatory Visit (INDEPENDENT_AMBULATORY_CARE_PROVIDER_SITE_OTHER): Payer: Self-pay | Admitting: Vascular Surgery

## 2023-02-12 DIAGNOSIS — I6529 Occlusion and stenosis of unspecified carotid artery: Secondary | ICD-10-CM

## 2023-02-15 ENCOUNTER — Ambulatory Visit (INDEPENDENT_AMBULATORY_CARE_PROVIDER_SITE_OTHER): Payer: Medicare Other | Admitting: Vascular Surgery

## 2023-02-15 ENCOUNTER — Encounter (INDEPENDENT_AMBULATORY_CARE_PROVIDER_SITE_OTHER): Payer: Medicare Other

## 2023-03-11 ENCOUNTER — Inpatient Hospital Stay: Payer: Medicare Other | Admitting: Neurology

## 2023-03-11 ENCOUNTER — Encounter: Payer: Self-pay | Admitting: Neurology

## 2023-06-09 DEATH — deceased
